# Patient Record
Sex: Male | Born: 1950 | Race: White | Hispanic: No | Marital: Married | Smoking: Former smoker
Health system: Southern US, Community
[De-identification: ages and names within clinical notes are randomized; demographics above are authoritative.]

## PROBLEM LIST (undated history)

## (undated) DIAGNOSIS — M869 Osteomyelitis, unspecified: Secondary | ICD-10-CM

## (undated) DIAGNOSIS — F419 Anxiety disorder, unspecified: Secondary | ICD-10-CM

## (undated) DIAGNOSIS — E119 Type 2 diabetes mellitus without complications: Secondary | ICD-10-CM

## (undated) DIAGNOSIS — I96 Gangrene, not elsewhere classified: Secondary | ICD-10-CM

## (undated) DIAGNOSIS — I10 Essential (primary) hypertension: Secondary | ICD-10-CM

## (undated) DIAGNOSIS — C801 Malignant (primary) neoplasm, unspecified: Secondary | ICD-10-CM

## (undated) DIAGNOSIS — H9190 Unspecified hearing loss, unspecified ear: Secondary | ICD-10-CM

## (undated) DIAGNOSIS — M1990 Unspecified osteoarthritis, unspecified site: Secondary | ICD-10-CM

## (undated) DIAGNOSIS — J302 Other seasonal allergic rhinitis: Secondary | ICD-10-CM

## (undated) HISTORY — DX: Essential (primary) hypertension: I10

## (undated) HISTORY — PX: FRACTURE SURGERY: SHX138

## (undated) HISTORY — DX: Gangrene, not elsewhere classified: I96

---
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
## 2009-01-04 HISTORY — PX: LEG AMPUTATION BELOW KNEE: SHX694

## 2009-01-12 ENCOUNTER — Inpatient Hospital Stay (HOSPITAL_COMMUNITY): Admission: EM | Admit: 2009-01-12 | Discharge: 2009-01-22 | Payer: Self-pay | Admitting: Emergency Medicine

## 2009-01-12 ENCOUNTER — Ambulatory Visit: Payer: Self-pay | Admitting: Internal Medicine

## 2009-01-14 ENCOUNTER — Encounter: Payer: Self-pay | Admitting: Internal Medicine

## 2009-01-18 ENCOUNTER — Encounter: Payer: Self-pay | Admitting: Vascular Surgery

## 2009-01-18 ENCOUNTER — Ambulatory Visit: Payer: Self-pay | Admitting: Vascular Surgery

## 2009-01-19 ENCOUNTER — Ambulatory Visit: Payer: Self-pay | Admitting: Physical Medicine & Rehabilitation

## 2009-01-26 ENCOUNTER — Ambulatory Visit: Payer: Self-pay | Admitting: Internal Medicine

## 2009-01-26 ENCOUNTER — Encounter: Payer: Self-pay | Admitting: Internal Medicine

## 2009-01-26 DIAGNOSIS — S88119A Complete traumatic amputation at level between knee and ankle, unspecified lower leg, initial encounter: Secondary | ICD-10-CM | POA: Insufficient documentation

## 2009-01-26 DIAGNOSIS — E1129 Type 2 diabetes mellitus with other diabetic kidney complication: Secondary | ICD-10-CM | POA: Insufficient documentation

## 2009-01-26 DIAGNOSIS — E119 Type 2 diabetes mellitus without complications: Secondary | ICD-10-CM

## 2009-01-26 DIAGNOSIS — I10 Essential (primary) hypertension: Secondary | ICD-10-CM | POA: Insufficient documentation

## 2009-01-26 LAB — CONVERTED CEMR LAB: Blood Glucose, Fingerstick: 177

## 2009-01-27 LAB — CONVERTED CEMR LAB
BUN: 21 mg/dL (ref 6–23)
CO2: 23 meq/L (ref 19–32)
Calcium: 8.8 mg/dL (ref 8.4–10.5)
Chloride: 96 meq/L (ref 96–112)
Creatinine, Ser: 1.03 mg/dL (ref 0.40–1.50)
Glucose, Bld: 153 mg/dL — ABNORMAL HIGH (ref 70–99)
Potassium: 5.1 meq/L (ref 3.5–5.3)
Sodium: 139 meq/L (ref 135–145)

## 2009-02-10 ENCOUNTER — Ambulatory Visit: Payer: Self-pay | Admitting: Internal Medicine

## 2009-02-16 ENCOUNTER — Encounter: Payer: Self-pay | Admitting: Internal Medicine

## 2009-02-17 ENCOUNTER — Ambulatory Visit: Payer: Self-pay | Admitting: Vascular Surgery

## 2009-02-19 ENCOUNTER — Encounter: Payer: Self-pay | Admitting: Internal Medicine

## 2009-02-23 ENCOUNTER — Ambulatory Visit: Payer: Self-pay | Admitting: Internal Medicine

## 2009-02-23 ENCOUNTER — Encounter: Payer: Self-pay | Admitting: Internal Medicine

## 2009-02-24 ENCOUNTER — Encounter: Payer: Self-pay | Admitting: Internal Medicine

## 2009-03-02 ENCOUNTER — Telehealth (INDEPENDENT_AMBULATORY_CARE_PROVIDER_SITE_OTHER): Payer: Self-pay | Admitting: *Deleted

## 2009-03-03 ENCOUNTER — Encounter: Payer: Self-pay | Admitting: Internal Medicine

## 2009-03-24 ENCOUNTER — Telehealth: Payer: Self-pay | Admitting: Internal Medicine

## 2009-03-31 ENCOUNTER — Encounter: Admission: RE | Admit: 2009-03-31 | Discharge: 2009-05-13 | Payer: Self-pay | Admitting: Vascular Surgery

## 2009-04-01 ENCOUNTER — Ambulatory Visit: Payer: Self-pay | Admitting: Internal Medicine

## 2009-04-01 DIAGNOSIS — N529 Male erectile dysfunction, unspecified: Secondary | ICD-10-CM | POA: Insufficient documentation

## 2009-04-01 LAB — CONVERTED CEMR LAB
Blood Glucose, Fingerstick: 104
Hgb A1c MFr Bld: 6.5 %

## 2009-04-02 ENCOUNTER — Telehealth: Payer: Self-pay | Admitting: Internal Medicine

## 2009-04-23 ENCOUNTER — Telehealth: Payer: Self-pay | Admitting: Internal Medicine

## 2009-05-27 ENCOUNTER — Encounter: Payer: Self-pay | Admitting: Internal Medicine

## 2009-05-28 ENCOUNTER — Ambulatory Visit: Payer: Self-pay | Admitting: Internal Medicine

## 2009-05-28 DIAGNOSIS — F17200 Nicotine dependence, unspecified, uncomplicated: Secondary | ICD-10-CM | POA: Insufficient documentation

## 2009-05-28 LAB — CONVERTED CEMR LAB
Blood Glucose, Fingerstick: 118
Hgb A1c MFr Bld: 6.5 %

## 2009-06-07 ENCOUNTER — Telehealth: Payer: Self-pay | Admitting: *Deleted

## 2009-09-03 ENCOUNTER — Encounter: Payer: Self-pay | Admitting: Internal Medicine

## 2009-09-03 ENCOUNTER — Telehealth: Payer: Self-pay | Admitting: Internal Medicine

## 2009-09-17 ENCOUNTER — Ambulatory Visit: Payer: Self-pay | Admitting: Internal Medicine

## 2009-09-17 LAB — CONVERTED CEMR LAB
Blood Glucose, Fingerstick: 120
Hgb A1c MFr Bld: 6.2 %

## 2009-10-19 ENCOUNTER — Telehealth: Payer: Self-pay | Admitting: Internal Medicine

## 2009-11-24 ENCOUNTER — Encounter: Payer: Self-pay | Admitting: Internal Medicine

## 2009-12-22 ENCOUNTER — Encounter: Payer: Self-pay | Admitting: Internal Medicine

## 2009-12-24 ENCOUNTER — Encounter: Payer: Self-pay | Admitting: Internal Medicine

## 2009-12-24 ENCOUNTER — Ambulatory Visit: Payer: Self-pay | Admitting: Internal Medicine

## 2009-12-24 LAB — CONVERTED CEMR LAB
ALT: 15 units/L (ref 0–53)
AST: 15 units/L (ref 0–37)
Albumin: 4.2 g/dL (ref 3.5–5.2)
Alkaline Phosphatase: 59 units/L (ref 39–117)
BUN: 16 mg/dL (ref 6–23)
Blood Glucose, Fingerstick: 142
CO2: 28 meq/L (ref 19–32)
Calcium: 9.5 mg/dL (ref 8.4–10.5)
Chloride: 103 meq/L (ref 96–112)
Cholesterol: 209 mg/dL — ABNORMAL HIGH (ref 0–200)
Creatinine, Ser: 0.79 mg/dL (ref 0.40–1.50)
Glucose, Bld: 132 mg/dL — ABNORMAL HIGH (ref 70–99)
HDL: 31 mg/dL — ABNORMAL LOW (ref >39)
Hgb A1c MFr Bld: 5.9 %
LDL Cholesterol: 127 mg/dL — ABNORMAL HIGH (ref 0–99)
Potassium: 4.4 meq/L (ref 3.5–5.3)
Sodium: 141 meq/L (ref 135–145)
Total Bilirubin: 0.4 mg/dL (ref 0.3–1.2)
Total CHOL/HDL Ratio: 6.7
Total Protein: 6.8 g/dL (ref 6.0–8.3)
Triglycerides: 254 mg/dL — ABNORMAL HIGH (ref <150)
VLDL: 51 mg/dL — ABNORMAL HIGH (ref 0–40)

## 2010-01-21 ENCOUNTER — Encounter: Payer: Self-pay | Admitting: Internal Medicine

## 2010-03-18 ENCOUNTER — Telehealth: Payer: Self-pay | Admitting: Internal Medicine

## 2010-03-24 ENCOUNTER — Encounter: Payer: Self-pay | Admitting: Internal Medicine

## 2010-04-05 NOTE — Letter (Signed)
Summary: HANGER-CMN  HANGER-CMN   Imported By: Shon Hough 02/01/2010 11:57:52  _____________________________________________________________________  External Attachment:    Type:   Image     Comment:   External Document

## 2010-04-05 NOTE — Letter (Signed)
Summary: Pharmacologist   Imported By: Florinda Marker 04/07/2009 14:09:22  _____________________________________________________________________  External Attachment:    Type:   Image     Comment:   External Document

## 2010-04-05 NOTE — Progress Notes (Signed)
Summary: refill/gg  Phone Note Refill Request  on September 03, 2009 12:19 PM  Refills Requested: Medication #1:  METFORMIN HCL 500 MG TABS Take 2 pills by mouth two times a day.  Method Requested: Electronic Initial call taken by: Merrie Roof RN,  September 03, 2009 12:19 PM  Follow-up for Phone Call        Refill approved-nurse to complete Follow-up by: Nelda Bucks DO,  September 03, 2009 12:39 PM

## 2010-04-05 NOTE — Progress Notes (Signed)
Summary: phone/gg      Phone Note Call from Patient   Caller: Spouse Summary of Call: Pt's wife called and states the LEVITRA 10 MG TABS cost $ 168 for 4 pills. Can you prescribe something less expensive but that will work with his health conditions. # [phone]  ex [address]  Eber Jones )  Initial call taken by: Merrie Roof RN,  April 02, 2009 9:52 AM  Follow-up for Phone Call        Pt may have Viagra  50mg  caps 1/2-1 tab by mouth 1 hr before activity #10 OR Cialis 10mg  tabs 1/2-1 tab by mouth 1 hr before activity #10.  Please complete the rx for whichever medication is cheaper.  Thank you! Follow-up by: Nelda Bucks DO,  April 02, 2009 2:33 PM  Additional Follow-up for Phone Call Additional follow up Details #1::        The Rx for viagra was called in.  I did not give any refills. Pt's wife informed. Additional Follow-up by: Merrie Roof RN,  April 02, 2009 3:07 PM    Additional Follow-up for Phone Call Additional follow up Details #2::    Thank you! Follow-up by: Nelda Bucks DO,  April 02, 2009 4:30 PM   Appended Document: phone/gg     Another call from pharmacy stating pt now wants Rx for levitra.  The viagra is MORE expensive that levitra.  I cancelled the Rx for viagra and okayed the orginal Rx for levitra that was sent in.

## 2010-04-05 NOTE — Letter (Signed)
Summary: ONE TOUCH  11-24-2009-12-2009  ONE TOUCH  11-24-2009-12-2009   Imported By: Margie Billet 12/28/2009 11:50:54  _____________________________________________________________________  External Attachment:    Type:   Image     Comment:   External Document

## 2010-04-05 NOTE — Progress Notes (Signed)
Summary: viagra/ hla  Phone Note Refill Request Message from:  Patient on October 19, 2009 1:51 PM  Refills Requested: Medication #1:  viagra 100mg  pt's spouse calls and ask for script of viagra 100mg , will cut in half, price is better this way  Initial call taken by: Marin Roberts RN,  October 19, 2009 1:52 PM  Follow-up for Phone Call        Rx electronically sent to pharmacy Follow-up by: Nelda Bucks DO,  October 19, 2009 2:07 PM    New/Updated Medications: VIAGRA 100 MG TABS (SILDENAFIL CITRATE) Take one tablet 30-58min before activity. Prescriptions: VIAGRA 100 MG TABS (SILDENAFIL CITRATE) Take one tablet 30-31min before activity.  #10 x 6   Entered and Authorized by:   Nelda Bucks DO   Signed by:   Nelda Bucks DO on 10/19/2009   Method used:   Electronically to        [facility] (retail)       [address], [address]       [place], [place]  [address]       Ph: [phone]       Fax: [phone]   RxID:   [id number]

## 2010-04-05 NOTE — Assessment & Plan Note (Signed)
Summary: Routine office visit   Vital Signs:  Patient profile:   60 year old male BP sitting:   121 / 80  (left arm)   Primary Care Provider:  Nelda Bucks DO   History of Present Illness: ** please refer to 2nd document for office visit 12/24/09 for vital sign data**  60 year old with history of DM, HTN, and  L BKA.  He presents today for routine follow up.  1. DM2: pt checking BG regularly.  Has monitor with him, CBGs ranging from 110-130s.  Pt taking all medication as directed without hypoglycemic or hyperglycemic episodes.  He is doing very well with metformin, now taking one tab every day.    2. HTN: stable.  Pt reports compliance with all medications. Denies HA, visual changes, dizziness, syncope, C/P, and SOB.  3. L BKA: Pt increase the amount of time his is up and ambulating with his prosthetic; now on his feet for approx 6hrs/day. He has recently changed to a new company re: his prosthetic.  His new prosthesis is much more comfortable and he has no problems with pain, abrasion, or bursitis.  4. Erectile dysfunction: Pt is doing well with Viarga.  No adverse effect.  5.  Tobacco use: pt is still smoking but is cutting back on the number of cigarettes he smokes daily.  Is interested in quitting but not quite ready to quit yet.  Pt has no other concerns or complaints today.    Diabetic Foot Exam Last Podiatry Exam Date: 12/24/2009  Foot Inspection Is there a history of a foot ulcer?              Yes Is there a foot ulcer now?              No Can the patient see the bottom of their feet?          Yes Are the shoes appropriate in style and fit?          Yes Is there swelling or an abnormal foot shape?          No Are the toenails long?                No Are the toenails thick?                No Are the toenails ingrown?              No Is there heavy callous build-up?              No Is there pain in the calf muscle (Intermittent claudication) when walking?    NoIs  there a claw toe deformity?              No Is there elevated skin temperature?            No Is there limited ankle dorsiflexion?            No Is there foot or ankle muscle weakness?            No  Diabetic Foot Care Education Patient educated on appropriate care of diabetic feet.  Pulse Check          Right Foot          Left Foot Posterior Tibial:        diminished            Dorsalis Pedis:        diminished  Comments: Pt is s/p L BKA. High Risk Feet? Yes Set Next Diabetic Foot Exam here: 12/30/2010   10-g (5.07) Semmes-Weinstein Monofilament Test Performed by: Chinita Pester RN          Right Foot          Left Foot Visual Inspection     normal          Test Control                  Site 1         normal          Site 2         normal          Site 3         normal          Site 4         normal          Site 5         normal          Site 6         normal          Site 7         normal          Site 8         normal          Site 9         normal           Impression      normal           Current Medications (verified): 1)  Lisinopril 20 Mg Tabs (Lisinopril) .... Take 1 Tablet By Mouth Daily. 2)  Flonase 50 Mcg/act Susp (Fluticasone Propionate) .... Once A Day. 3)  Novolog 100 Unit/ml Soln (Insulin Aspart) .... Ssi Used As Directed. 4)  Metformin Hcl 500 Mg Tabs (Metformin Hcl) .... Take 1 Tablet By Mouth Two Times A Day 5)  Viagra 100 Mg Tabs (Sildenafil Citrate) .... Take One Tablet 30-35min Before Activity.  Allergies (verified): 1)  ! Penicillin  Past History:  Past medical, surgical, family and social histories (including risk factors) reviewed, and no changes noted (except as noted below).  Past Medical History: Reviewed history from 01/26/2009 and no changes required. Diabetes mellitus, type II Hypertension Left fott gangrene S/P KBA  Family History: Reviewed history from 01/26/2009 and no changes required. Father: hip replacement, prostate  cancer 9 yo. Mother: Died 59.  Social History: Reviewed history from 01/26/2009 and no changes required. Married Current Smoker  Physical Exam  General:  vital signs reviewed and within normal limits.  pt alert, well-developed, well-nourished, and well-hydrated.   Head:  normocephalic and atraumatic.   Eyes:  vision grossly intact, pupils equal, pupils round, pupils reactive to light, pupils react to accomodation, and no injection.   Mouth:  pharynx pink and moist.   Neck:  supple and full ROM.   Lungs:  normal respiratory effort, normal breath sounds, no dullness, no crackles, and no wheezes.   Heart:  normal rate, regular rhythm, no murmur, no gallop, and no rub.   Abdomen:  soft, non-tender, normal bowel sounds, and no distention.   Msk:  normal ROM, no joint swelling, and no joint warmth. Prosthesis in place on LLE, pt able to stand and ambulate. A bunion has developed at the anterior medial aspect of pt BKA- this is without erythema, induration, or tenderness  to palpation Extremities:  Left BKA noted Neurologic:  alert & oriented X3, cranial nerves II-XII intact, and strength normal in all extremities.   Skin:  turgor normal, color normal, and no rashes.   Psych:  normally interactive, good eye contact, not anxious appearing, and not depressed appearing.    Diabetes Management Exam:    Foot Exam (with socks and/or shoes not present):       Sensory-Monofilament:          Right foot: normal   Impression & Recommendations:  Problem # 1:  DIABETES MELLITUS, TYPE II (ICD-250.00)  Pt continues to do very well managing his DM.  His updated medication list for this problem includes:    Lisinopril 20 Mg Tabs (Lisinopril) .Marland Kitchen... Take 1 tablet by mouth daily.    Novolog 100 Unit/ml Soln (Insulin aspart) ..... Ssi used as directed.    Metformin Hcl 500 Mg Tabs (Metformin hcl) .Marland Kitchen... Take 1 tablet by mouth two times a day  Labs Reviewed: Creat: 1.03 (01/26/2009)    Reviewed HgBA1c  results: 6.2 (09/17/2009)  6.5 (05/28/2009)  Future Orders: T-Lipid Profile ([id number]) ... 12/26/2009  Problem # 2:  HYPERTENSION (ICD-401.9) Initial BP elevated.  BP check repeated after visit with improvement to 121/80.  Will not make any changes to his medication regimen at this time.  His updated medication list for this problem includes:    Lisinopril 20 Mg Tabs (Lisinopril) .Marland Kitchen... Take 1 tablet by mouth daily.  Prior BP: 161/81 (09/17/2009)  Labs Reviewed: K+: 5.1 (01/26/2009) Creat: : 1.03 (01/26/2009)     Future Orders: T-CMP with Estimated GFR ([id number]) ... 12/26/2009 T-Lipid Profile [phone]) ... 12/26/2009  Problem # 3:  ERECTILE DYSFUNCTION, ORGANIC (ICD-607.84)  His updated medication list for this problem includes:    Viagra 100 Mg Tabs (Sildenafil citrate) .Marland Kitchen... Take one tablet 30-71min before activity.  Discussed proper use of medications, as well as side effects.   Problem # 4:  NICOTINE ADDICTION (ICD-305.1)  Encouraged smoking cessation and discussed different methods for smoking cessation.   Problem # 5:  Preventive Health Care (ICD-V70.0) Pt declines annual flu vax.  Complete Medication List: 1)  Lisinopril 20 Mg Tabs (Lisinopril) .... Take 1 tablet by mouth daily. 2)  Flonase 50 Mcg/act Susp (Fluticasone propionate) .... Once a day. 3)  Novolog 100 Unit/ml Soln (Insulin aspart) .... Ssi used as directed. 4)  Metformin Hcl 500 Mg Tabs (Metformin hcl) .... Take 1 tablet by mouth two times a day 5)  Viagra 100 Mg Tabs (Sildenafil citrate) .... Take one tablet 30-63min before activity.  Patient Instructions: 1)  Please schedule a follow-up appointment in 6 months with Dr. Arvilla Market. 2)  We will get blood work at your next appt. 3)  Bring the paperwork for handicap parking with you to your next appt. 4)  Keep up the great work! 5)  Happy Halloween!   Orders Added: 1)  Est. Patient Level III [65784] 2)  T-CMP with Estimated GFR  [[id number]] 3)  T-Lipid Profile [[id number]]    Prevention & Chronic Care Immunizations   Influenza vaccine: Not documented   Influenza vaccine deferral: Refused  (02/10/2009)    Tetanus booster: Not documented    Pneumococcal vaccine: Not documented  Colorectal Screening   Hemoccult: Not documented   Hemoccult action/deferral: Deferred  (12/24/2009)    Colonoscopy: Not documented  Other Screening   PSA: Not documented   PSA action/deferral: Discussion deferred  (12/24/2009)   Smoking status: current  (09/17/2009)  Smoking cessation counseling: no  (09/17/2009)  Diabetes Mellitus   HgbA1C: 6.2  (09/17/2009)   HgbA1C action/deferral: Ordered  (04/01/2009)   Hemoglobin A1C due: 04/02/2009    Eye exam: Not documented   Diabetic eye exam action/deferral: Deferred  (05/28/2009)   Eye exam due: 08/05/2009    Foot exam: yes  (12/24/2009)   Foot exam action/deferral: Do today   High risk foot: Yes  (12/24/2009)   Foot care education: Done  (12/24/2009)   Foot exam due: 12/30/2010    Urine microalbumin/creatinine ratio: Not documented    Diabetes flowsheet reviewed?: Yes   Progress toward A1C goal: At goal  Lipids   Total Cholesterol: Not documented   Lipid panel action/deferral: Deferred   LDL: Not documented   LDL Direct: Not documented   HDL: Not documented   Triglycerides: Not documented   Lipid panel due: 04/30/2009  Hypertension   Last Blood Pressure: 121 / 80  (12/24/2009)   Serum creatinine: 1.03  (01/26/2009)   Serum potassium 5.1  (01/26/2009)   Basic metabolic panel due: 08/06/2009  Self-Management Support :   Personal Goals (by the next clinic visit) :     Personal A1C goal: 7  (02/10/2009)     Personal blood pressure goal: 130/80  (02/10/2009)     Personal LDL goal: 100  (02/10/2009)    Diabetes self-management support: Copy of home glucose meter record, Education handout, Resources for patients handout, Written self-care plan   (09/17/2009)    Hypertension self-management support: Copy of home glucose meter record, Education handout, Resources for patients handout, Written self-care plan  (09/17/2009)   Nursing Instructions: Diabetic foot exam today  Process Orders Check Orders Results:     [facility]: ABN not required for this insurance Tests Sent for requisitioning (January 01, 2010 5:34 PM):     12/26/2009: [facility] -- T-CMP with Estimated GFR [[id number]] (signed)     12/26/2009: [facility] -- T-Lipid Profile [phone] (signed)

## 2010-04-05 NOTE — Assessment & Plan Note (Signed)
Summary: 4 week check up okay to book per dr [email or web address]   Vital Signs:  Patient profile:   60 year old male Height:      62 inches Weight:      209.0 pounds BMI:     38.36 Temp:     9.7 degrees F oral Pulse rate:   80 / minute BP sitting:   144 / 74  (right arm)  Vitals Entered By: Filomena Jungling NT II (April 01, 2009 1:37 PM) CC: hfu Is Patient Diabetic? Yes Did you bring your meter with you today? Yes Pain Assessment Patient in pain? no      Nutritional Status BMI of > 30 = obese CBG Result 104  Have you ever been in a relationship where you felt threatened, hurt or afraid?No   Does patient need assistance? Functional Status Self care Ambulation Wheelchair Comments has prosthesia   Primary Care Provider:  Nelda Bucks DO  CC:  hfu.  History of Present Illness: 60 year old with history of newly diagnosed DM, HTN, and recent R BKA.  He presents today for routine follow up.  1. DM2: pt checking BG regularly.  Has monitor with him, CBGs ranging from 110-130s.  Pt taking all medication as directed without hypoglycemic or hyperglycemic episodes.  He is doing very well with the transition from insulin to metformin; no side effects reported from metformin.  2. HTN: stable.  Pt reports compliance with all medications. Denies HA, visual changes, dizziness, syncope, C/P, and SOB.  3. L BKA: Pt now with prosthetic, able to stand in the office today!  Doing very well, working with PT/OT.  Is able to get around fairly well at home, slowly increasing the time he is using the prosthetic.    4. New complaint of erectile dysfunction.  Pt occasionally using Percocet at night for pain relief but states pain is well controlled with OTC NSAIDs and tylenol.  Preventive Screening-Counseling & Management  Alcohol-Tobacco     Smoking Status: current     Smoking Cessation Counseling: no     Packs/Day: 3/4 ppd  Current Medications (verified): 1)  Vicodin 5-500 Mg Tabs  (Hydrocodone-Acetaminophen) .... Take 1 Tablet Every 4 Hours For Pain As Needed. 2)  Lisinopril 20 Mg Tabs (Lisinopril) .... Take 1 Tablet By Mouth Daily. 3)  Flonase 50 Mcg/act Susp (Fluticasone Propionate) .... Once A Day. 4)  Novolog 100 Unit/ml Soln (Insulin Aspart) .... Ssi Used As Directed. 5)  Metformin Hcl 500 Mg Tabs (Metformin Hcl) .... Take 2 Pills By Mouth Two Times A Day. 6)  Levitra 10 Mg Tabs (Vardenafil Hcl) .... Take 1/2 Pill By Mouth 1hr Before Activity. May Increase To 1 Full Pill If Needed  Allergies (verified): 1)  ! Penicillin  Past History:  Past medical, surgical, family and social histories (including risk factors) reviewed, and no changes noted (except as noted below).  Past Medical History: Reviewed history from 01/26/2009 and no changes required. Diabetes mellitus, type II Hypertension Left fott gangrene S/P KBA  Family History: Reviewed history from 01/26/2009 and no changes required. Father: hip replacement, prostate cancer 83 yo. Mother: Died 70.  Social History: Reviewed history from 01/26/2009 and no changes required. Married Current Smoker  Physical Exam  General:  alert, well-developed, well-nourished, and well-hydrated.   Head:  normocephalic and atraumatic.   Eyes:  vision grossly intact, pupils equal, pupils round, pupils reactive to light, pupils react to accomodation, and no injection.   Mouth:  pharynx pink and moist.  Neck:  supple and full ROM.   Lungs:  normal respiratory effort, normal breath sounds, no dullness, no crackles, and no wheezes.   Heart:  normal rate, regular rhythm, no murmur, no gallop, and no rub.   Abdomen:  soft, non-tender, normal bowel sounds, and no distention.   Msk:  normal ROM, no joint swelling, and no joint warmth. Prosthesis in place, pt able to stand comfortably.     Neurologic:  alert & oriented X3, cranial nerves II-XII intact, and strength normal in all extremities.   Psych:  normally interactive,  good eye contact, not anxious appearing, and not depressed appearing.     Impression & Recommendations:  Problem # 1:  DIABETES MELLITUS, TYPE II (ICD-250.00) Pt doing very well with his transition to Metformin.  Advised pt to use SSI only if needed for meal time coverage.  He has not needed to use it often over the past few weeks.  I anticipate he will be well controlled with metformin alone.  WIll repeat HcA1c today and f/u in 69month. His updated medication list for this problem includes:    Lisinopril 20 Mg Tabs (Lisinopril) .Marland Kitchen... Take 1 tablet by mouth daily.    Novolog 100 Unit/ml Soln (Insulin aspart) ..... Ssi used as directed.    Metformin Hcl 500 Mg Tabs (Metformin hcl) .Marland Kitchen... Take 2 pills by mouth two times a day.  Orders: T-Hgb A1C (in-house) ([id number])  Problem # 2:  HYPERTENSION (ICD-401.9) BP stable.  Will continue with current dose of lisinopril for now, if SBP remains >140 may need to increase dose.  Pt is doing very well with diet and lifestyle changes and may not need increase of lisinopril.  His updated medication list for this problem includes:    Lisinopril 20 Mg Tabs (Lisinopril) .Marland Kitchen... Take 1 tablet by mouth daily.  Problem # 3:  BKA, LEFT LEG (ICD-V49.75) Pt doind incredibly well.  Now with prosthetic leg, working with PT.  Follows with Dr. Darrick Penna.  Reports some pain with increased length of prosthetic use.  Pain fairly well controlled with OTC meds.  Advised pt it is ok to use percocet if needed.  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  Levitra appears to be the least expensive option.  WIll change to Cialis or Viagra if needed 2/2 cost.  Reviewed methods of use and  possible side effects.  Will start with 1/2 pill and increase if needed.  His updated medication list for this problem includes:    Levitra 10 Mg Tabs (Vardenafil hcl) .Marland Kitchen... Take 1/2 pill by mouth 1hr before activity. may increase to 1 full pill if needed  Problem # 5:   HEMATURIA UNSPECIFIED (ICD-599.70) Pt wo follow up with urology.  WIll repeat UA and next visit and ensure pt made f/u appt.  Complete Medication List: 1)  Vicodin 5-500 Mg Tabs (Hydrocodone-acetaminophen) .... Take 1 tablet every 4 hours for pain as needed. 2)  Lisinopril 20 Mg Tabs (Lisinopril) .... Take 1 tablet by mouth daily. 3)  Flonase 50 Mcg/act Susp (Fluticasone propionate) .... Once a day. 4)  Novolog 100 Unit/ml Soln (Insulin aspart) .... Ssi used as directed. 5)  Metformin Hcl 500 Mg Tabs (Metformin hcl) .... Take 2 pills by mouth two times a day. 6)  Levitra 10 Mg Tabs (Vardenafil hcl) .... Take 1/2 pill by mouth 1hr before activity. may increase to 1 full pill if needed  Patient Instructions: 1)  Please schedule a follow-up appointment in  2 months. 2)  Regarding the Levitra: The starting dose is 5mg .  I have written for 10mg  tablets.  Get a pill cutter and cut the 10mg  pill in half.  Take half of a tablet 1hr before activity.  Try this a few times, if it does not work, increase to 1 full tablet. 3)  Keep up the great work!!! Prescriptions: LEVITRA 10 MG TABS (VARDENAFIL HCL) Take 1/2 pill by mouth 1hr before activity. May increase to 1 full pill if needed  #10 x 1   Entered and Authorized by:   Nelda Bucks DO   Signed by:   Nelda Bucks DO on 04/01/2009   Method used:   Print then Give to Patient   RxID:   [id number] FLONASE 50 MCG/ACT SUSP (FLUTICASONE PROPIONATE) once a day.  #1 x 11   Entered and Authorized by:   Nelda Bucks DO   Signed by:   Nelda Bucks DO on 04/01/2009   Method used:   Print then Give to Patient   RxID:   [id number]   Prevention & Chronic Care Immunizations   Influenza vaccine: Not documented   Influenza vaccine deferral: Refused  (02/10/2009)    Tetanus booster: Not documented    Pneumococcal vaccine: Not documented  Colorectal Screening   Hemoccult: Not documented    Colonoscopy: Not documented  Other Screening    PSA: Not documented   Smoking status: current  (04/01/2009)   Smoking cessation counseling: no  (04/01/2009)  Diabetes Mellitus   HgbA1C: 6.5  (04/01/2009)   HgbA1C action/deferral: Ordered  (04/01/2009)   Hemoglobin A1C due: 04/02/2009    Eye exam: Not documented    Foot exam: Not documented   High risk foot: Not documented   Foot care education: Not documented    Urine microalbumin/creatinine ratio: Not documented    Diabetes flowsheet reviewed?: Yes   Progress toward A1C goal: Unchanged  Lipids   Total Cholesterol: Not documented   LDL: Not documented   LDL Direct: Not documented   HDL: Not documented   Triglycerides: Not documented   Lipid panel due: 04/30/2009  Hypertension   Last Blood Pressure: 144 / 74  (04/01/2009)   Serum creatinine: 1.03  (01/26/2009)   Serum potassium 5.1  (01/26/2009)   Basic metabolic panel due: 04/30/2009  Self-Management Support :   Personal Goals (by the next clinic visit) :     Personal A1C goal: 7  (02/10/2009)     Personal blood pressure goal: 130/80  (02/10/2009)     Personal LDL goal: 100  (02/10/2009)    Diabetes self-management support: Copy of home glucose meter record  (02/23/2009)    Hypertension self-management support: Not documented   Nursing Instructions: HgbA1C today (see order)    Laboratory Results   Blood Tests   Date/Time Received: April 01, 2009 3:09 PM  Date/Time Reported: Blenda Mounts  April 01, 2009 3:09 PM   HGBA1C: 6.5%   (Normal Range: Non-Diabetic - 3-6%   Control Diabetic - 6-8%) CBG Random:: 104mg /dL

## 2010-04-05 NOTE — Assessment & Plan Note (Signed)
Summary: est-ck/fu/meds/cfb   Vital Signs:  Patient profile:   60 year old male Height:      62 inches Weight:      208.6 pounds Temp:     97.8 degrees F oral  Vitals Entered By: Filomena Jungling NT II (May 28, 2009 1:47 PM)  Primary Care Provider:  Nelda Bucks DO   History of Present Illness: 60 year old with history of newly diagnosed DM, HTN, and recent R BKA.  He presents today for routine follow up.  1. DM2: pt checking BG regularly.  Has monitor with him, CBGs ranging from 110-130s.  Pt taking all medication as directed without hypoglycemic or hyperglycemic episodes.  He is doing very well with metformin, now taking one tab qam and 2 qpm about every other day; no side effects reported from metformin.  2. HTN: stable.  Pt reports compliance with all medications. Denies HA, visual changes, dizziness, syncope, C/P, and SOB.  3. 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  Doing very well, graduated PT/OT.  Is able to get around well at home, slowly increasing the time he is using the prosthetic.    4. Erectile dysfunction: Levitra is working well.  Denies any side effects from medication.  Wants to try Viagra to see if it is less expensive.  5.  Tobacco use: pt is still smoking but is cutting back on the number of cigarettes he smokes daily.  Is interested in quitting but not quite ready to quit yet.    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 Take 1 Tablet Every 4 Hours For Pain As Needed. 2)  Lisinopril 20 Mg Tabs (Lisinopril) .... Take 1 Tablet By Mouth Daily. 3)  Flonase 50 Mcg/act Susp (Fluticasone Propionate) .... Once A Day. 4)  Novolog 100 Unit/ml Soln (Insulin Aspart) .... Ssi Used As  Directed. 5)  Metformin Hcl 500 Mg Tabs (Metformin Hcl) .... Take 2 Pills By Mouth Two Times A Day. 6)  Levitra 20 Mg Tabs (Vardenafil Hcl) .... Take 1/2 Tablet By Mouth 1 Hour Before Activity Once A Day As Needed  Allergies (verified): 1)  ! Penicillin  Past History:  Past medical, surgical, family and social histories (including risk factors) reviewed, and no changes noted (except as noted below).  Past Medical History: Reviewed history from 01/26/2009 and no changes required. Diabetes mellitus, type II Hypertension Left fott gangrene S/P KBA  Family History: Reviewed history from 01/26/2009 and no changes required. Father: hip replacement, prostate cancer 52 yo. Mother: Died 52.  Social History: Reviewed history from 01/26/2009 and no changes required. Married Current Smoker  Physical Exam  General:  alert, well-developed, well-nourished, and well-hydrated.   Head:  normocephalic and atraumatic.   Eyes:  vision grossly intact, pupils equal, pupils round, pupils reactive to light, pupils react to accomodation, and no injection.   Mouth:  pharynx pink and moist.   Lungs:  normal respiratory effort, normal breath sounds, no dullness, no crackles, and no wheezes.   Heart:  normal rate, regular rhythm, no murmur, no gallop, and no rub.   Abdomen:  soft, non-tender, normal bowel sounds, and no distention.   Msk:  normal ROM, no joint swelling, and no joint warmth. Prosthesis in place,  pt able to stand and ambulate comfortably.     Neurologic:  alert & oriented X3, cranial nerves II-XII intact, and strength normal in all extremities.   Skin:  turgor normal, color normal, and no rashes.   Psych:  normally interactive, good eye contact, not anxious appearing, and not depressed appearing.     Impression & Recommendations:  Problem # 1:  DIABETES MELLITUS, TYPE II (ICD-250.00) Pt is doing very well managing his diabetes.  Will change his metformin regimen to 1 tab qam and 2 qpm;  repeat A1c in 34mo and remove insulin from his medicationl list.  Will refer for eye exam at next appt.  His updated medication list for this problem includes:    Lisinopril 20 Mg Tabs (Lisinopril) .Marland Kitchen... Take 1 tablet by mouth daily.    Novolog 100 Unit/ml Soln (Insulin aspart) ..... Ssi used as directed.    Metformin Hcl 500 Mg Tabs (Metformin hcl) .Marland Kitchen... Take 2 pills by mouth two times a day.  Orders: T- Capillary Blood Glucose (04540) T-Hgb A1C (in-house) ([id number])  Labs Reviewed: Creat: 1.03 (01/26/2009)    Reviewed HgBA1c results: 6.5 (05/28/2009)  6.5 (04/01/2009)  Problem # 2:  HYPERTENSION (ICD-401.9)  Stable.  Will continue to monitor and check BMET in 34mo.  His updated medication list for this problem includes:    Lisinopril 20 Mg Tabs (Lisinopril) .Marland Kitchen... Take 1 tablet by mouth daily.  Prior BP: 144/74 (04/01/2009)  Labs Reviewed: K+: 5.1 (01/26/2009) Creat: : 1.03 (01/26/2009)     Problem # 3:  ERECTILE DYSFUNCTION, ORGANIC (ICD-607.84)  Pt is doing well with Levitra.  Will write script for Viagra and make change to EMR if pt desires to continue with Viagra 2/2 decreased cost.  His updated medication list for this problem includes:    Levitra 20 Mg Tabs (Vardenafil hcl) .Marland Kitchen... Take 1/2 tablet by mouth 1 hour before activity once a day as needed  Discussed proper use of medications, as well as side effects.   Problem # 4:  HEMATURIA UNSPECIFIED (ICD-599.70) Pt was seen by Dr. Brunilda Payor; reports no abnormalities on exam with resolution of hematuria.  Will request records.  Pt has f/u with Dr. Brunilda Payor in June 2011.  Problem # 5:  NICOTINE ADDICTION (ICD-305.1)  Encouraged smoking cessation and discussed different methods for smoking cessation.   Pt is currently working on decrease the number of cigarettes he smokes every day.  WIll f/u at next visit.  Complete Medication List: 1)  Vicodin 5-500 Mg Tabs (Hydrocodone-acetaminophen) .... Take 1 tablet every 4 hours for pain as  needed. 2)  Lisinopril 20 Mg Tabs (Lisinopril) .... Take 1 tablet by mouth daily. 3)  Flonase 50 Mcg/act Susp (Fluticasone propionate) .... Once a day. 4)  Novolog 100 Unit/ml Soln (Insulin aspart) .... Ssi used as directed. 5)  Metformin Hcl 500 Mg Tabs (Metformin hcl) .... Take 2 pills by mouth two times a day. 6)  Levitra 20 Mg Tabs (Vardenafil hcl) .... Take 1/2 tablet by mouth 1 hour before activity once a day as needed  Patient Instructions: 1)  Please schedule a follow-up appointment in 3 months with Dr. Arvilla Market. 2)  Take one Metformin pill in the morning and 2 at night.   3)  Keep monitoring your blood sugar closely at first, but then decrease your sugar checks to 2-3 times/week. 4)  With the viagra, it is ok to cut the pills in half and try half of a pill the first time you use it.  5)  Keep up the good work!    Laboratory Results   Blood Tests   Date/Time Received: May 28, 2009 2:19 PM  Date/Time Reported: Burke Keels  May 28, 2009 2:19 PM   HGBA1C: 6.5%   (Normal Range: Non-Diabetic - 3-6%   Control Diabetic - 6-8%) CBG Random:: 118mg /dL     Prevention & Chronic Care Immunizations   Influenza vaccine: Not documented   Influenza vaccine deferral: Refused  (02/10/2009)    Tetanus booster: Not documented    Pneumococcal vaccine: Not documented  Colorectal Screening   Hemoccult: Not documented    Colonoscopy: Not documented  Other Screening   PSA: Not documented  Reports requested:  Smoking status: current  (04/01/2009)   Smoking cessation counseling: no  (04/01/2009)  Diabetes Mellitus   HgbA1C: 6.5  (05/28/2009)   HgbA1C action/deferral: Ordered  (04/01/2009)   Hemoglobin A1C due: 04/02/2009    Eye exam: Not documented   Diabetic eye exam action/deferral: Deferred  (05/28/2009)   Eye exam due: 08/05/2009    Foot exam: Not documented   High risk foot: Not documented   Foot care education: Not documented    Urine microalbumin/creatinine  ratio: Not documented    Diabetes flowsheet reviewed?: Yes   Progress toward A1C goal: At goal  Lipids   Total Cholesterol: Not documented   Lipid panel action/deferral: Deferred   LDL: Not documented   LDL Direct: Not documented   HDL: Not documented   Triglycerides: Not documented   Lipid panel due: 04/30/2009  Hypertension   Last Blood Pressure: 144 / 74  (04/01/2009)   Serum creatinine: 1.03  (01/26/2009)   Serum potassium 5.1  (01/26/2009)   Basic metabolic panel due: 08/06/2009    Hypertension flowsheet reviewed?: Yes   Progress toward BP goal: Unchanged  Self-Management Support :   Personal Goals (by the next clinic visit) :     Personal A1C goal: 7  (02/10/2009)     Personal blood pressure goal: 130/80  (02/10/2009)     Personal LDL goal: 100  (02/10/2009)    Diabetes self-management support: Copy of home glucose meter record  (02/23/2009)    Hypertension self-management support: Not documented   Nursing Instructions: Request records from Dr. Brunilda Payor, urology.

## 2010-04-05 NOTE — Progress Notes (Signed)
Summary: refill/gg  Phone Note Refill Request  on September 03, 2009 1:38 PM  Refills Requested: Medication #1:  METFORMIN HCL 500 MG TABS Take 2 pills by mouth two times a day.  Method Requested: Electronic Initial call taken by: Merrie Roof RN,  September 03, 2009 1:39 PM  Follow-up for Phone Call        Rx submited to pharmacy Follow-up by: Nelda Bucks DO,  September 03, 2009 1:45 PM    Prescriptions: METFORMIN HCL 500 MG TABS (METFORMIN HCL) Take 2 pills by mouth two times a day.  #120 x 3   Entered and Authorized by:   Nelda Bucks DO   Signed by:   Nelda Bucks DO on 09/03/2009   Method used:   Electronically to        [facility] [address] (retail)       [address], [address]       [place], [place]  [address]       Ph: [phone]       Fax: [phone]   RxID:   [id number]

## 2010-04-05 NOTE — Progress Notes (Signed)
Summary: phone/gg  Phone Note Call from Patient   Caller: Patient Summary of Call: Pt called for results of A1C last labs given. Initial call taken by: Merrie Roof RN,  June 07, 2009 5:08 PM

## 2010-04-05 NOTE — Progress Notes (Signed)
Summary: Metformin/dosing  Phone Note Call from Patient   Caller: Patient Call For: Lonnie Bucks DO Summary of Call: Call from pt and his wife need refill on pt's Metformin.  Pt is taking 2 tablets of the 500 mg in the am and pm.  Pt has not done the change that was ordered 03/02/2009. Says that they were unaware of the change in the dosing.  Sugars have been the lowest at 90 and as high as 156 a couple of days ago after eating sugar free cookies.  Pt says that he feel fine has no diarrhea only constipation due to his pain med.  Question should pt continue or go to the planned dosing.  Pt has an appointment scheduled for 04/01/2009. Angelina Ok RN  March 24, 2009 9:41 AM  Initial call taken by: Angelina Ok RN,  March 24, 2009 9:41 AM  Follow-up for Phone Call        I read the note you mentioned and I think there was a typo.  I think the pt is taking the medicine as it was intended (2 pills two times a day).  The pt was being transitioned from insulin to all oral meds and was on a schedule to titrate UP metformin.  Leave Metformin 2 pills two times a day. Called pt to verify dosing.  Will refill.    New/Updated Medications: METFORMIN HCL 500 MG TABS (METFORMIN HCL) Take 2 pills by mouth two times a day. Prescriptions: METFORMIN HCL 500 MG TABS (METFORMIN HCL) Take 2 pills by mouth two times a day.  #120 x 3   Entered and Authorized by:   Blanch Media MD   Signed by:   Blanch Media MD on 03/24/2009   Method used:   Electronically to        [facility] [address] (retail)       [address]       [place], [place]  [address]       Ph: [phone]       Fax: [phone]   RxID:   [id number]

## 2010-04-05 NOTE — Assessment & Plan Note (Signed)
Summary: CHECKUP/SB.   Vital Signs:  Patient profile:   60 year old male Height:      62 inches (157.48 cm) Weight:      206.2 pounds (93.73 kg) BMI:     37.85 Temp:     97.4 degrees F oral Pulse rate:   88 / minute BP sitting:   122 / 71  (right arm)  Vitals Entered By: Chinita Pester RN (December 24, 2009 2:07 PM) CC: Check-up. Is Patient Diabetic? Yes Did you bring your meter with you today? Yes Pain Assessment Patient in pain? no      Nutritional Status BMI of > 30 = obese CBG Result 142  Have you ever been in a relationship where you felt threatened, hurt or afraid?Unable to ask; WIFE W/PT.   Does patient need assistance? Functional Status Self care Ambulation Normal   Primary Care Girlie Veltri:  Nelda Bucks DO  CC:  Check-up.Marland Kitchen  History of Present Illness: Please refer to 2nd note dated 10/21 for full visit info  Depression History:      The patient denies a depressed mood most of the day and a diminished interest in his usual daily activities.         Preventive Screening-Counseling & Management  Alcohol-Tobacco     Alcohol drinks/day: 0     Smoking Status: current     Smoking Cessation Counseling: no     Smoke Cessation Stage: contemplative     Packs/Day: 3/4 -1 ppd  Caffeine-Diet-Exercise     Does Patient Exercise: no  Allergies: 1)  ! Penicillin  Social History: Packs/Day:  3/4 -1 ppd   Complete Medication List: 1)  Vicodin 5-500 Mg Tabs (Hydrocodone-acetaminophen) .... Take 1 tablet every 4 hours for pain as needed. 2)  Lisinopril 20 Mg Tabs (Lisinopril) .... Take 1 tablet by mouth daily. 3)  Flonase 50 Mcg/act Susp (Fluticasone propionate) .... Once a day. 4)  Novolog 100 Unit/ml Soln (Insulin aspart) .... Ssi used as directed. 5)  Metformin Hcl 500 Mg Tabs (Metformin hcl) .... Take 1 tablet by mouth two times a day 6)  Viagra 100 Mg Tabs (Sildenafil citrate) .... Take one tablet 30-5min before activity.  Other Orders: T- Capillary Blood  Glucose (16109) T-Hgb A1C (in-house) ([id number])   Orders Added: 1)  T- Capillary Blood Glucose [82948] 2)  T-Hgb A1C (in-house) [[id number]]    Prevention & Chronic Care Immunizations   Influenza vaccine: Not documented   Influenza vaccine deferral: Refused  (02/10/2009)    Tetanus booster: Not documented    Pneumococcal vaccine: Not documented  Colorectal Screening   Hemoccult: Not documented    Colonoscopy: Not documented  Other Screening   PSA: Not documented   Smoking status: current  (12/24/2009)   Smoking cessation counseling: no  (12/24/2009)  Diabetes Mellitus   HgbA1C: 5.9  (12/24/2009)   HgbA1C action/deferral: Ordered  (04/01/2009)   Hemoglobin A1C due: 04/02/2009    Eye exam: Not documented   Diabetic eye exam action/deferral: Deferred  (05/28/2009)   Eye exam due: 08/05/2009    Foot exam: Not documented   Foot exam action/deferral: Do today   High risk foot: Yes  (09/17/2009)   Foot care education: Done  (09/17/2009)    Urine microalbumin/creatinine ratio: Not documented  Lipids   Total Cholesterol: Not documented   Lipid panel action/deferral: Deferred   LDL: Not documented   LDL Direct: Not documented   HDL: Not documented   Triglycerides: Not documented   Lipid panel due:  04/30/2009  Hypertension   Last Blood Pressure: 122 / 71  (12/24/2009)   Serum creatinine: 1.03  (01/26/2009)   Serum potassium 5.1  (01/26/2009)   Basic metabolic panel due: 08/06/2009  Self-Management Support :   Personal Goals (by the next clinic visit) :     Personal A1C goal: 7  (02/10/2009)     Personal blood pressure goal: 130/80  (02/10/2009)     Personal LDL goal: 100  (02/10/2009)    Patient will work on the following items until the next clinic visit to reach self-care goals:     Medications and monitoring: check my blood sugar, bring all of my medications to every visit, examine my feet every day  (12/24/2009)     Eating: eat more vegetables, use fresh or  frozen vegetables, eat foods that are low in salt, eat baked foods instead of fried foods  (12/24/2009)    Diabetes self-management support: Copy of home glucose meter record, Written self-care plan  (12/24/2009)   Diabetes care plan printed    Hypertension self-management support: Written self-care plan  (12/24/2009)   Hypertension self-care plan printed.  Vicodin 5/500mg  - disposed of 219 tablets; witnessed by Dr. Coralee Pesa.   Chinita Pester RN  December 24, 2009 2:30 PM   Laboratory Results   Blood Tests   Date/Time Received: December 24, 2009 2:30 PM Date/Time Reported: .sign   HGBA1C: 5.9%   (Normal Range: Non-Diabetic - 3-6%   Control Diabetic - 6-8%) CBG Random:: 142mg /dL

## 2010-04-05 NOTE — Progress Notes (Signed)
Summary: change dosage/ hla  Phone Note Call from Patient   Caller: Spouse Summary of Call: pt's spouse calls w/ pt in background, stating 20mg  leVitra is $9.00 ea at [facility], they would like to get 10 20mg  tabs and cut them in half, can you send a new script? Initial call taken by: Marin Roberts RN,  April 23, 2009 10:27 AM  Follow-up for Phone Call        Please call in a script for Levitra 20mg  tabs. Sig: 1/2 to 1 tab by mouth 1hr before activity.  Disp #10.  Thank you!  I am at home sick today and will try to check my desktop regulary.  If you need something urgently, please ask the clinic attending.  Thanks! Follow-up by: Nelda Bucks DO,  April 23, 2009 10:34 AM    New/Updated Medications: LEVITRA 20 MG TABS (VARDENAFIL HCL) Take 1/2 tablet by mouth 1 hour before activity once a day as needed Prescriptions: LEVITRA 20 MG TABS (VARDENAFIL HCL) Take 1/2 tablet by mouth 1 hour before activity once a day as needed  #10 x 5   Entered and Authorized by:   Doneen Poisson MD   Signed by:   Doneen Poisson MD on 04/26/2009   Method used:   Electronically to        [facility] [address] (retail)       [address], [address]       [place], [place]  [address]       Ph: [phone]       Fax: [phone]   RxID:   [id number]

## 2010-04-05 NOTE — Consult Note (Signed)
[facility]   Imported By: Margie Billet 09/13/2009 16:06:20  _____________________________________________________________________  External Attachment:    Type:   Image     Comment:   External Document

## 2010-04-05 NOTE — Medication Information (Signed)
Summary: METFORMIN  METFORMIN   Imported By: Margie Billet 12/23/2009 12:28:28  _____________________________________________________________________  External Attachment:    Type:   Image     Comment:   External Document

## 2010-04-05 NOTE — Assessment & Plan Note (Signed)
Summary: est-ck/fu/meds/cfb   Vital Signs:  Patient profile:   60 year old male Height:      62 inches (157.48 cm) Weight:      206.1 pounds (93.68 kg) BMI:     37.83 Temp:     97.9 degrees F oral Pulse rate:   80 / minute BP sitting:   161 / 81  (right arm)  Vitals Entered By: Chinita Pester RN (September 17, 2009 1:42 PM) CC: F/U visit. Is Patient Diabetic? Yes Did you bring your meter with you today? Yes Pain Assessment Patient in pain? no      Nutritional Status BMI of > 30 = obese CBG Result 120  Have you ever been in a relationship where you felt threatened, hurt or afraid?Unable to ask; someone w/pt.   Does patient need assistance? Functional Status Self care Ambulation Impaired:Risk for fall Comments BKA   Diabetic Foot Exam Last Podiatry Exam Date: 09/17/2009  Foot Inspection Is there a history of a foot ulcer?              No Is there a foot ulcer now?              No Can the patient see the bottom of their feet?          Yes Are the shoes appropriate in style and fit?          Yes Is there swelling or an abnormal foot shape?          No Are the toenails long?                No Are the toenails thick?                No Are the toenails ingrown?              No Is there heavy callous build-up?              No  Diabetic Foot Care Education Patient educated on appropriate care of diabetic feet.  Pulse Check          Right Foot          Left Foot Dorsalis Pedis:        diminished            Comments: Left BKA Healing sore on ankle. High Risk Feet? Yes   10-g (5.07) Semmes-Weinstein Monofilament Test Performed by: Chinita Pester RN          Right Foot          Left Foot Visual Inspection               Test Control                  Site 1         normal          Site 2         normal          Site 3         normal          Site 4         normal          Site 5         normal          Site 6         normal          Site 7  normal          Site 8          normal          Site 9         abnormal            Primary Care Provider:  Nelda Bucks DO  CC:  F/U visit.Marland Kitchen  History of Present Illness: 60 year old with history of newly diagnosed DM, HTN, and recent R BKA.  He presents today for routine follow up.  1. DM2: pt checking BG regularly.  Has monitor with him, CBGs ranging from 110-130s.  Pt taking all medication as directed without hypoglycemic or hyperglycemic episodes.  He is doing very well with metformin, now taking one tab qam and 2 qpm about every other day.  Would like to try further decrease in metformin  2. HTN: stable.  Pt reports compliance with all medications. Denies HA, visual changes, dizziness, syncope, C/P, and SOB.  3. L BKA: Pt increase the amount of time his is up and ambulating with his prosthetic; now on his feet for approx 6hrs/day.  He is experiencing some patellar pain from his prosthetic; this has become more pronounced with time as his leg continues to change.  He has a f/u appt with the prosthetic ppl on 09/24/09 regarding this issue.  4. Erectile dysfunction: Levitra is working well however he experiences occasional dizziness with it.  He has tried the Viagra at the full 50mg  dose without any adverse effect and would prefer to continue with this, however it is more expensive.  5.  Tobacco use: pt is still smoking but is cutting back on the number of cigarettes he smokes daily.  Is interested in quitting but not quite ready to quit yet.    Depression History:      The patient denies a depressed mood most of the day and a diminished interest in his usual daily activities.  The patient denies significant weight loss, significant weight gain, insomnia, fatigue (loss of energy), and feelings of worthlessness (guilt).  The patient denies symptoms of a manic disorder including less need for sleep and talkative or feels need to keep talking.         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 Take 1 Tablet Every 4 Hours For Pain As Needed. 2)  Lisinopril 20 Mg Tabs (Lisinopril) .... Take 1 Tablet By Mouth Daily. 3)  Flonase 50 Mcg/act Susp (Fluticasone Propionate) .... Once A Day. 4)  Novolog 100 Unit/ml Soln (Insulin Aspart) .... Ssi Used As Directed. 5)  Metformin Hcl 500 Mg Tabs (Metformin Hcl) .... Take 1 Tablet By Mouth Two Times A Day 6)  Levitra 20 Mg Tabs (Vardenafil Hcl) .... Take 1/2 Tablet By Mouth 1 Hour Before Activity Once A Day As Needed  Allergies (verified): 1)  ! Penicillin  Past History:  Past medical, surgical,  family and social histories (including risk factors) reviewed for relevance to current acute and chronic problems.  Past Medical History: Reviewed history from 01/26/2009 and no changes required. Diabetes mellitus, type II Hypertension Left fott gangrene S/P KBA  Family History: Reviewed history from 01/26/2009 and no changes required. Father: hip replacement, prostate cancer 56 yo. Mother: Died 47.  Social History: Reviewed history from 01/26/2009 and no changes required. Married Current Smoker Does Patient Exercise:  no  Physical Exam  General:  alert, well-developed, well-nourished, and well-hydrated.   Head:  normocephalic and atraumatic.   Eyes:  vision grossly intact, pupils equal, pupils round, pupils reactive to light, pupils react to accomodation, and no injection.     Mouth:  pharynx pink and moist.   Neck:  supple and full ROM.   Lungs:  normal respiratory effort, normal breath sounds, no dullness, no crackles, and no wheezes.   Heart:  normal rate, regular rhythm, no murmur, no gallop, and no rub.   Abdomen:  soft, non-tender, normal bowel sounds, and no distention.   Msk:  normal ROM, no joint swelling, and no joint warmth. Prosthesis in place on LLE, pt able to stand and ambulate. A bunion has developed at the anterior medial aspect of pt BKA- this is without erythema, induration, or tenderness to palpation Extremities:  Left BKA Neurologic:  alert & oriented X3, cranial nerves II-XII intact, and strength normal in all extremities.   Skin:  turgor normal, color normal, and no rashes.   Psych:  normally interactive, good eye contact, not anxious appearing, and not depressed appearing.     Impression & Recommendations:  Problem # 1:  DIABETES MELLITUS, TYPE II (ICD-250.00) Pt continues to do remarkably well managing his DM2.  Will decrease his metformin to 500mg  two times a day and check A1c in 56mo.    His updated medication list for this problem includes:    Lisinopril 20 Mg Tabs (Lisinopril) .Marland Kitchen... Take 1 tablet by mouth daily.    Novolog 100 Unit/ml Soln (Insulin aspart) ..... Ssi used as directed.    Metformin Hcl 500 Mg Tabs (Metformin hcl) .Marland Kitchen... Take 1 tablet by mouth two times a day  Orders: T- Capillary Blood Glucose (78295) T-Hgb A1C (in-house) (62130QM)  Labs Reviewed: Creat: 1.03 (01/26/2009)    Reviewed HgBA1c results: 6.2 (09/17/2009)  6.5 (05/28/2009)  Problem # 2:  HYPERTENSION (ICD-401.9) BP elevated today.  Will continue to monitor; if it remains elevated will consider addition of HCTZ to his anti-HTN regimen. His updated medication list for this problem includes:    Lisinopril 20 Mg Tabs (Lisinopril) .Marland Kitchen... Take 1 tablet by mouth daily.  BP today: 161/81 Prior BP: 144/74 (04/01/2009)  Labs Reviewed: K+: 5.1  (01/26/2009) Creat: : 1.03 (01/26/2009)     Problem # 3:  ERECTILE DYSFUNCTION, ORGANIC (ICD-607.84) Pt prefers Viagra, however it is more expensive.  He does well with the 50mg  tabs; will determine if the 100mg  form of Viagra is the same cost as the 50mg  tabs.  If this is so, will write for 100mg  tabs and advise pt to cut pills in half.  If the 100mg  tabs are more expensive, will discuss plan to continue with Viagra vs Levitra with pt.  His updated medication list for this problem includes:    Levitra 20 Mg Tabs (Vardenafil hcl) .Marland Kitchen... Take 1/2 tablet by mouth 1 hour before activity once a day as needed  Problem # 4:  NICOTINE ADDICTION (ICD-305.1)  Encouraged smoking cessation and discussed  different methods for smoking cessation.  Pt not yet ready to quit.  Will continue to encourage him to quit and will discuss again at his next appointment.  Complete Medication List: 1)  Vicodin 5-500 Mg Tabs (Hydrocodone-acetaminophen) .... Take 1 tablet every 4 hours for pain as needed. 2)  Lisinopril 20 Mg Tabs (Lisinopril) .... Take 1 tablet by mouth daily. 3)  Flonase 50 Mcg/act Susp (Fluticasone propionate) .... Once a day. 4)  Novolog 100 Unit/ml Soln (Insulin aspart) .... Ssi used as directed. 5)  Metformin Hcl 500 Mg Tabs (Metformin hcl) .... Take 1 tablet by mouth two times a day 6)  Levitra 20 Mg Tabs (Vardenafil hcl) .... Take 1/2 tablet by mouth 1 hour before activity once a day as needed  Patient Instructions: 1)  Please schedule a follow-up appointment in 3 months with Dr. Arvilla Market. 2)  Keep up the great work!  Prevention & Chronic Care Immunizations   Influenza vaccine: Not documented   Influenza vaccine deferral: Refused  (02/10/2009)    Tetanus booster: Not documented    Pneumococcal vaccine: Not documented  Colorectal Screening   Hemoccult: Not documented    Colonoscopy: Not documented  Other Screening   PSA: Not documented   Smoking status: current  (09/17/2009)    Smoking cessation counseling: no  (09/17/2009)  Diabetes Mellitus   HgbA1C: 6.2  (09/17/2009)   HgbA1C action/deferral: Ordered  (04/01/2009)   Hemoglobin A1C due: 04/02/2009    Eye exam: Not documented   Diabetic eye exam action/deferral: Deferred  (05/28/2009)   Eye exam due: 08/05/2009    Foot exam: Not documented   Foot exam action/deferral: Do today   High risk foot: Yes  (09/17/2009)   Foot care education: Done  (09/17/2009)    Urine microalbumin/creatinine ratio: Not documented  Lipids   Total Cholesterol: Not documented   Lipid panel action/deferral: Deferred   LDL: Not documented   LDL Direct: Not documented   HDL: Not documented   Triglycerides: Not documented   Lipid panel due: 04/30/2009  Hypertension   Last Blood Pressure: 161 / 81  (09/17/2009)   Serum creatinine: 1.03  (01/26/2009)   Serum potassium 5.1  (01/26/2009)   Basic metabolic panel due: 08/06/2009  Self-Management Support :   Personal Goals (by the next clinic visit) :     Personal A1C goal: 7  (02/10/2009)     Personal blood pressure goal: 130/80  (02/10/2009)     Personal LDL goal: 100  (02/10/2009)    Patient will work on the following items until the next clinic visit to reach self-care goals:     Medications and monitoring: examine my feet every day  (09/17/2009)     Eating: drink diet soda or water instead of juice or soda, eat more vegetables, use fresh or frozen vegetables, eat foods that are low in salt, eat baked foods instead of fried foods  (09/17/2009)    Diabetes self-management support: Copy of home glucose meter record, Education handout, Resources for patients handout, Written self-care plan  (09/17/2009)   Diabetes care plan printed   Diabetes education handout printed    Hypertension self-management support: Copy of home glucose meter record, Education handout, Resources for patients handout, Written self-care plan  (09/17/2009)   Hypertension self-care plan printed.    Hypertension education handout printed      Resource handout printed.   Nursing Instructions: Diabetic foot exam today     Laboratory Results   Blood Tests   Date/Time Received:  September 17, 2009 2:03 PM  Date/Time Reported: Burke Keels  September 17, 2009 2:03 PM   HGBA1C: 6.2%   (Normal Range: Non-Diabetic - 3-6%   Control Diabetic - 6-8%) CBG Random:: 120mg /dL

## 2010-04-05 NOTE — Miscellaneous (Signed)
Summary: Pharmacologist   Imported By: Florinda Marker 05/31/2009 16:45:04  _____________________________________________________________________  External Attachment:    Type:   Image     Comment:   External Document

## 2010-04-07 NOTE — Progress Notes (Signed)
Summary: refill/ hla  Phone Note Refill Request Message from:  Patient on March 18, 2010 12:18 PM  Refills Requested: Medication #1:  LISINOPRIL 20 MG TABS Take 1 tablet by mouth daily.   Dosage confirmed as above?Dosage Confirmed  Medication #2:  METFORMIN HCL 500 MG TABS Take 1 tablet by mouth two times a day   Dosage confirmed as above?Dosage Confirmed Initial call taken by: Marin Roberts RN,  March 18, 2010 12:18 PM  Follow-up for Phone Call        rx submitted to pharmacy Follow-up by: Nelda Bucks DO,  March 22, 2010 8:54 AM    Prescriptions: METFORMIN HCL 500 MG TABS (METFORMIN HCL) Take 1 tablet by mouth two times a day  #60 x 6   Entered and Authorized by:   Nelda Bucks DO   Signed by:   Nelda Bucks DO on 03/22/2010   Method used:   Electronically to        [facility] [address] (retail)       [address], [address]       [place], [place]  [address]       Ph: [phone]       Fax: [phone]   RxID:   [id number] LISINOPRIL 20 MG TABS (LISINOPRIL) Take 1 tablet by mouth daily.  #90 x 3   Entered and Authorized by:   Nelda Bucks DO   Signed by:   Nelda Bucks DO on 03/22/2010   Method used:   Electronically to        [facility] [address] (retail)       [address], [address]       [place], [place]  [address]       Ph: [phone]       Fax: [phone]   RxID:   [id number]

## 2010-04-07 NOTE — Letter (Signed)
Summary: [facility]   Imported By: Margie Billet 04/01/2010 16:29:02  _____________________________________________________________________  External Attachment:    Type:   Image     Comment:   External Document

## 2010-05-18 LAB — GLUCOSE, CAPILLARY: Glucose-Capillary: 142 mg/dL — ABNORMAL HIGH (ref 70–99)

## 2010-05-21 LAB — GLUCOSE, CAPILLARY: Glucose-Capillary: 120 mg/dL — ABNORMAL HIGH (ref 70–99)

## 2010-05-30 LAB — GLUCOSE, CAPILLARY: Glucose-Capillary: 118 mg/dL — ABNORMAL HIGH (ref 70–99)

## 2010-06-08 LAB — DIFFERENTIAL
Basophils Absolute: 0 10*3/uL (ref 0.0–0.1)
Basophils Absolute: 0 10*3/uL (ref 0.0–0.1)
Basophils Relative: 0 % (ref 0–1)
Basophils Relative: 0 % (ref 0–1)
Eosinophils Absolute: 0 10*3/uL (ref 0.0–0.7)
Eosinophils Absolute: 0 10*3/uL (ref 0.0–0.7)
Eosinophils Relative: 0 % (ref 0–5)
Eosinophils Relative: 0 % (ref 0–5)
Lymphocytes Relative: 12 % (ref 12–46)
Lymphocytes Relative: 8 % — ABNORMAL LOW (ref 12–46)
Lymphs Abs: 1.7 10*3/uL (ref 0.7–4.0)
Lymphs Abs: 2 10*3/uL (ref 0.7–4.0)
Monocytes Absolute: 1 10*3/uL (ref 0.1–1.0)
Monocytes Absolute: 1.5 10*3/uL — ABNORMAL HIGH (ref 0.1–1.0)
Monocytes Relative: 5 % (ref 3–12)
Monocytes Relative: 9 % (ref 3–12)
Neutro Abs: 13.3 10*3/uL — ABNORMAL HIGH (ref 1.7–7.7)
Neutro Abs: 18.1 10*3/uL — ABNORMAL HIGH (ref 1.7–7.7)
Neutrophils Relative %: 79 % — ABNORMAL HIGH (ref 43–77)
Neutrophils Relative %: 87 % — ABNORMAL HIGH (ref 43–77)

## 2010-06-08 LAB — GLUCOSE, CAPILLARY
Glucose-Capillary: 154 mg/dL — ABNORMAL HIGH (ref 70–99)
Glucose-Capillary: 154 mg/dL — ABNORMAL HIGH (ref 70–99)
Glucose-Capillary: 163 mg/dL — ABNORMAL HIGH (ref 70–99)
Glucose-Capillary: 177 mg/dL — ABNORMAL HIGH (ref 70–99)
Glucose-Capillary: 185 mg/dL — ABNORMAL HIGH (ref 70–99)
Glucose-Capillary: 185 mg/dL — ABNORMAL HIGH (ref 70–99)
Glucose-Capillary: 194 mg/dL — ABNORMAL HIGH (ref 70–99)
Glucose-Capillary: 198 mg/dL — ABNORMAL HIGH (ref 70–99)
Glucose-Capillary: 201 mg/dL — ABNORMAL HIGH (ref 70–99)
Glucose-Capillary: 225 mg/dL — ABNORMAL HIGH (ref 70–99)
Glucose-Capillary: 229 mg/dL — ABNORMAL HIGH (ref 70–99)
Glucose-Capillary: 247 mg/dL — ABNORMAL HIGH (ref 70–99)
Glucose-Capillary: 287 mg/dL — ABNORMAL HIGH (ref 70–99)
Glucose-Capillary: 291 mg/dL — ABNORMAL HIGH (ref 70–99)
Glucose-Capillary: 304 mg/dL — ABNORMAL HIGH (ref 70–99)
Glucose-Capillary: 175 mg/dL — ABNORMAL HIGH (ref 70–99)
Glucose-Capillary: 180 mg/dL — ABNORMAL HIGH (ref 70–99)
Glucose-Capillary: 181 mg/dL — ABNORMAL HIGH (ref 70–99)
Glucose-Capillary: 183 mg/dL — ABNORMAL HIGH (ref 70–99)
Glucose-Capillary: 192 mg/dL — ABNORMAL HIGH (ref 70–99)
Glucose-Capillary: 193 mg/dL — ABNORMAL HIGH (ref 70–99)
Glucose-Capillary: 201 mg/dL — ABNORMAL HIGH (ref 70–99)
Glucose-Capillary: 204 mg/dL — ABNORMAL HIGH (ref 70–99)
Glucose-Capillary: 206 mg/dL — ABNORMAL HIGH (ref 70–99)
Glucose-Capillary: 216 mg/dL — ABNORMAL HIGH (ref 70–99)
Glucose-Capillary: 224 mg/dL — ABNORMAL HIGH (ref 70–99)
Glucose-Capillary: 245 mg/dL — ABNORMAL HIGH (ref 70–99)
Glucose-Capillary: 249 mg/dL — ABNORMAL HIGH (ref 70–99)
Glucose-Capillary: 260 mg/dL — ABNORMAL HIGH (ref 70–99)
Glucose-Capillary: 261 mg/dL — ABNORMAL HIGH (ref 70–99)
Glucose-Capillary: 262 mg/dL — ABNORMAL HIGH (ref 70–99)
Glucose-Capillary: 262 mg/dL — ABNORMAL HIGH (ref 70–99)
Glucose-Capillary: 264 mg/dL — ABNORMAL HIGH (ref 70–99)
Glucose-Capillary: 267 mg/dL — ABNORMAL HIGH (ref 70–99)
Glucose-Capillary: 267 mg/dL — ABNORMAL HIGH (ref 70–99)
Glucose-Capillary: 267 mg/dL — ABNORMAL HIGH (ref 70–99)
Glucose-Capillary: 268 mg/dL — ABNORMAL HIGH (ref 70–99)
Glucose-Capillary: 273 mg/dL — ABNORMAL HIGH (ref 70–99)
Glucose-Capillary: 277 mg/dL — ABNORMAL HIGH (ref 70–99)
Glucose-Capillary: 281 mg/dL — ABNORMAL HIGH (ref 70–99)
Glucose-Capillary: 303 mg/dL — ABNORMAL HIGH (ref 70–99)
Glucose-Capillary: 321 mg/dL — ABNORMAL HIGH (ref 70–99)
Glucose-Capillary: 352 mg/dL — ABNORMAL HIGH (ref 70–99)
Glucose-Capillary: 352 mg/dL — ABNORMAL HIGH (ref 70–99)

## 2010-06-08 LAB — URINALYSIS, MICROSCOPIC ONLY
Bilirubin Urine: NEGATIVE
Glucose, UA: 100 mg/dL — AB
Ketones, ur: 15 mg/dL — AB
Leukocytes, UA: NEGATIVE
Nitrite: NEGATIVE
Protein, ur: >300 mg/dL — AB
Specific Gravity, Urine: 1.022 (ref 1.005–1.030)
Urobilinogen, UA: 1 mg/dL (ref 0.0–1.0)
pH: 5.5 (ref 5.0–8.0)

## 2010-06-08 LAB — BASIC METABOLIC PANEL
BUN: 10 mg/dL (ref 6–23)
BUN: 10 mg/dL (ref 6–23)
BUN: 11 mg/dL (ref 6–23)
BUN: 10 mg/dL (ref 6–23)
BUN: 10 mg/dL (ref 6–23)
BUN: 11 mg/dL (ref 6–23)
BUN: 11 mg/dL (ref 6–23)
BUN: 12 mg/dL (ref 6–23)
BUN: 15 mg/dL (ref 6–23)
BUN: 8 mg/dL (ref 6–23)
CO2: 29 mEq/L (ref 19–32)
CO2: 31 mEq/L (ref 19–32)
CO2: 34 mEq/L — ABNORMAL HIGH (ref 19–32)
CO2: 25 mEq/L (ref 19–32)
CO2: 28 mEq/L (ref 19–32)
CO2: 28 mEq/L (ref 19–32)
CO2: 28 mEq/L (ref 19–32)
CO2: 29 mEq/L (ref 19–32)
CO2: 29 mEq/L (ref 19–32)
CO2: 30 mEq/L (ref 19–32)
Calcium: 8.4 mg/dL (ref 8.4–10.5)
Calcium: 8.4 mg/dL (ref 8.4–10.5)
Calcium: 8.8 mg/dL (ref 8.4–10.5)
Calcium: 7.7 mg/dL — ABNORMAL LOW (ref 8.4–10.5)
Calcium: 7.9 mg/dL — ABNORMAL LOW (ref 8.4–10.5)
Calcium: 8 mg/dL — ABNORMAL LOW (ref 8.4–10.5)
Calcium: 8.1 mg/dL — ABNORMAL LOW (ref 8.4–10.5)
Calcium: 8.1 mg/dL — ABNORMAL LOW (ref 8.4–10.5)
Calcium: 8.2 mg/dL — ABNORMAL LOW (ref 8.4–10.5)
Calcium: 8.2 mg/dL — ABNORMAL LOW (ref 8.4–10.5)
Chloride: 96 mEq/L (ref 96–112)
Chloride: 98 mEq/L (ref 96–112)
Chloride: 99 mEq/L (ref 96–112)
Chloride: 101 mEq/L (ref 96–112)
Chloride: 89 mEq/L — ABNORMAL LOW (ref 96–112)
Chloride: 93 mEq/L — ABNORMAL LOW (ref 96–112)
Chloride: 95 mEq/L — ABNORMAL LOW (ref 96–112)
Chloride: 96 mEq/L (ref 96–112)
Chloride: 97 mEq/L (ref 96–112)
Chloride: 98 mEq/L (ref 96–112)
Creatinine, Ser: 0.89 mg/dL (ref 0.4–1.5)
Creatinine, Ser: 1.03 mg/dL (ref 0.4–1.5)
Creatinine, Ser: 1.08 mg/dL (ref 0.4–1.5)
Creatinine, Ser: 0.88 mg/dL (ref 0.4–1.5)
Creatinine, Ser: 1 mg/dL (ref 0.4–1.5)
Creatinine, Ser: 1.01 mg/dL (ref 0.4–1.5)
Creatinine, Ser: 1.02 mg/dL (ref 0.4–1.5)
Creatinine, Ser: 1.13 mg/dL (ref 0.4–1.5)
Creatinine, Ser: 1.13 mg/dL (ref 0.4–1.5)
Creatinine, Ser: 1.2 mg/dL (ref 0.4–1.5)
GFR calc Af Amer: >60 mL/min (ref >60)
GFR calc Af Amer: >60 mL/min (ref >60)
GFR calc Af Amer: >60 mL/min (ref >60)
GFR calc Af Amer: >60 mL/min (ref >60)
GFR calc Af Amer: >60 mL/min (ref >60)
GFR calc Af Amer: >60 mL/min (ref >60)
GFR calc Af Amer: >60 mL/min (ref >60)
GFR calc Af Amer: >60 mL/min (ref >60)
GFR calc Af Amer: >60 mL/min (ref >60)
GFR calc Af Amer: >60 mL/min (ref >60)
GFR calc non Af Amer: >60 mL/min (ref >60)
GFR calc non Af Amer: >60 mL/min (ref >60)
GFR calc non Af Amer: >60 mL/min (ref >60)
GFR calc non Af Amer: >60 mL/min (ref >60)
GFR calc non Af Amer: >60 mL/min (ref >60)
GFR calc non Af Amer: >60 mL/min (ref >60)
GFR calc non Af Amer: >60 mL/min (ref >60)
GFR calc non Af Amer: >60 mL/min (ref >60)
GFR calc non Af Amer: >60 mL/min (ref >60)
GFR calc non Af Amer: >60 mL/min (ref >60)
Glucose, Bld: 151 mg/dL — ABNORMAL HIGH (ref 70–99)
Glucose, Bld: 183 mg/dL — ABNORMAL HIGH (ref 70–99)
Glucose, Bld: 185 mg/dL — ABNORMAL HIGH (ref 70–99)
Glucose, Bld: 161 mg/dL — ABNORMAL HIGH (ref 70–99)
Glucose, Bld: 172 mg/dL — ABNORMAL HIGH (ref 70–99)
Glucose, Bld: 177 mg/dL — ABNORMAL HIGH (ref 70–99)
Glucose, Bld: 182 mg/dL — ABNORMAL HIGH (ref 70–99)
Glucose, Bld: 236 mg/dL — ABNORMAL HIGH (ref 70–99)
Glucose, Bld: 297 mg/dL — ABNORMAL HIGH (ref 70–99)
Glucose, Bld: 374 mg/dL — ABNORMAL HIGH (ref 70–99)
Potassium: 4.5 mEq/L (ref 3.5–5.1)
Potassium: 4.5 mEq/L (ref 3.5–5.1)
Potassium: 4.8 mEq/L (ref 3.5–5.1)
Potassium: 3.5 mEq/L (ref 3.5–5.1)
Potassium: 3.6 mEq/L (ref 3.5–5.1)
Potassium: 3.6 mEq/L (ref 3.5–5.1)
Potassium: 3.8 mEq/L (ref 3.5–5.1)
Potassium: 3.9 mEq/L (ref 3.5–5.1)
Potassium: 4 mEq/L (ref 3.5–5.1)
Potassium: 4 mEq/L (ref 3.5–5.1)
Sodium: 136 mEq/L (ref 135–145)
Sodium: 136 mEq/L (ref 135–145)
Sodium: 136 mEq/L (ref 135–145)
Sodium: 125 mEq/L — ABNORMAL LOW (ref 135–145)
Sodium: 130 mEq/L — ABNORMAL LOW (ref 135–145)
Sodium: 132 mEq/L — ABNORMAL LOW (ref 135–145)
Sodium: 134 mEq/L — ABNORMAL LOW (ref 135–145)
Sodium: 135 mEq/L (ref 135–145)
Sodium: 137 mEq/L (ref 135–145)
Sodium: 138 mEq/L (ref 135–145)

## 2010-06-08 LAB — CBC
HCT: 37 % — ABNORMAL LOW (ref 39.0–52.0)
HCT: 38.4 % — ABNORMAL LOW (ref 39.0–52.0)
HCT: 39.6 % (ref 39.0–52.0)
HCT: 40 % (ref 39.0–52.0)
HCT: 37.4 % — ABNORMAL LOW (ref 39.0–52.0)
HCT: 38.7 % — ABNORMAL LOW (ref 39.0–52.0)
HCT: 39.8 % (ref 39.0–52.0)
HCT: 40.3 % (ref 39.0–52.0)
HCT: 40.9 % (ref 39.0–52.0)
HCT: 41.7 % (ref 39.0–52.0)
HCT: 43.6 % (ref 39.0–52.0)
Hemoglobin: 12.6 g/dL — ABNORMAL LOW (ref 13.0–17.0)
Hemoglobin: 13.1 g/dL (ref 13.0–17.0)
Hemoglobin: 13.6 g/dL (ref 13.0–17.0)
Hemoglobin: 13.6 g/dL (ref 13.0–17.0)
Hemoglobin: 12.8 g/dL — ABNORMAL LOW (ref 13.0–17.0)
Hemoglobin: 13.3 g/dL (ref 13.0–17.0)
Hemoglobin: 13.8 g/dL (ref 13.0–17.0)
Hemoglobin: 14 g/dL (ref 13.0–17.0)
Hemoglobin: 14.1 g/dL (ref 13.0–17.0)
Hemoglobin: 14.2 g/dL (ref 13.0–17.0)
Hemoglobin: 14.8 g/dL (ref 13.0–17.0)
MCHC: 34 g/dL (ref 30.0–36.0)
MCHC: 34 g/dL (ref 30.0–36.0)
MCHC: 34 g/dL (ref 30.0–36.0)
MCHC: 34.5 g/dL (ref 30.0–36.0)
MCHC: 33.9 g/dL (ref 30.0–36.0)
MCHC: 34.1 g/dL (ref 30.0–36.0)
MCHC: 34.3 g/dL (ref 30.0–36.0)
MCHC: 34.3 g/dL (ref 30.0–36.0)
MCHC: 34.4 g/dL (ref 30.0–36.0)
MCHC: 34.7 g/dL (ref 30.0–36.0)
MCHC: 34.8 g/dL (ref 30.0–36.0)
MCV: 87.6 fL (ref 78.0–100.0)
MCV: 87.6 fL (ref 78.0–100.0)
MCV: 88 fL (ref 78.0–100.0)
MCV: 88.5 fL (ref 78.0–100.0)
MCV: 87.5 fL (ref 78.0–100.0)
MCV: 87.5 fL (ref 78.0–100.0)
MCV: 87.5 fL (ref 78.0–100.0)
MCV: 88.1 fL (ref 78.0–100.0)
MCV: 88.3 fL (ref 78.0–100.0)
MCV: 88.5 fL (ref 78.0–100.0)
MCV: 88.9 fL (ref 78.0–100.0)
Platelets: 487 10*3/uL — ABNORMAL HIGH (ref 150–400)
Platelets: 488 10*3/uL — ABNORMAL HIGH (ref 150–400)
Platelets: 538 10*3/uL — ABNORMAL HIGH (ref 150–400)
Platelets: 562 10*3/uL — ABNORMAL HIGH (ref 150–400)
Platelets: 244 10*3/uL (ref 150–400)
Platelets: 247 10*3/uL (ref 150–400)
Platelets: 297 10*3/uL (ref 150–400)
Platelets: 329 10*3/uL (ref 150–400)
Platelets: 376 10*3/uL (ref 150–400)
Platelets: 405 10*3/uL — ABNORMAL HIGH (ref 150–400)
Platelets: 420 10*3/uL — ABNORMAL HIGH (ref 150–400)
RBC: 4.19 MIL/uL — ABNORMAL LOW (ref 4.22–5.81)
RBC: 4.36 MIL/uL (ref 4.22–5.81)
RBC: 4.52 MIL/uL (ref 4.22–5.81)
RBC: 4.57 MIL/uL (ref 4.22–5.81)
RBC: 4.28 MIL/uL (ref 4.22–5.81)
RBC: 4.39 MIL/uL (ref 4.22–5.81)
RBC: 4.51 MIL/uL (ref 4.22–5.81)
RBC: 4.6 MIL/uL (ref 4.22–5.81)
RBC: 4.67 MIL/uL (ref 4.22–5.81)
RBC: 4.69 MIL/uL (ref 4.22–5.81)
RBC: 4.93 MIL/uL (ref 4.22–5.81)
RDW: 12.7 % (ref 11.5–15.5)
RDW: 12.8 % (ref 11.5–15.5)
RDW: 13 % (ref 11.5–15.5)
RDW: 13 % (ref 11.5–15.5)
RDW: 12.6 % (ref 11.5–15.5)
RDW: 12.7 % (ref 11.5–15.5)
RDW: 12.8 % (ref 11.5–15.5)
RDW: 12.9 % (ref 11.5–15.5)
RDW: 12.9 % (ref 11.5–15.5)
RDW: 13 % (ref 11.5–15.5)
RDW: 13.1 % (ref 11.5–15.5)
WBC: 13.8 10*3/uL — ABNORMAL HIGH (ref 4.0–10.5)
WBC: 14 10*3/uL — ABNORMAL HIGH (ref 4.0–10.5)
WBC: 14.3 10*3/uL — ABNORMAL HIGH (ref 4.0–10.5)
WBC: 14.7 10*3/uL — ABNORMAL HIGH (ref 4.0–10.5)
WBC: 15.4 10*3/uL — ABNORMAL HIGH (ref 4.0–10.5)
WBC: 15.6 10*3/uL — ABNORMAL HIGH (ref 4.0–10.5)
WBC: 16.2 10*3/uL — ABNORMAL HIGH (ref 4.0–10.5)
WBC: 16.9 10*3/uL — ABNORMAL HIGH (ref 4.0–10.5)
WBC: 17.4 10*3/uL — ABNORMAL HIGH (ref 4.0–10.5)
WBC: 18.1 10*3/uL — ABNORMAL HIGH (ref 4.0–10.5)
WBC: 20.8 10*3/uL — ABNORMAL HIGH (ref 4.0–10.5)

## 2010-06-08 LAB — CROSSMATCH
ABO/RH(D): A POS
Antibody Screen: NEGATIVE

## 2010-06-08 LAB — CULTURE, BLOOD (ROUTINE X 2)
Culture: NO GROWTH
Culture: NO GROWTH

## 2010-06-08 LAB — HEMOGLOBIN A1C
Hgb A1c MFr Bld: 10.1 % — ABNORMAL HIGH (ref 4.6–6.1)
Mean Plasma Glucose: 243 mg/dL

## 2010-06-08 LAB — CK: Total CK: 28 U/L (ref 7–232)

## 2010-06-08 LAB — HEPATIC FUNCTION PANEL
ALT: 55 U/L — ABNORMAL HIGH (ref 0–53)
AST: 53 U/L — ABNORMAL HIGH (ref 0–37)
Albumin: 2.2 g/dL — ABNORMAL LOW (ref 3.5–5.2)
Alkaline Phosphatase: 111 U/L (ref 39–117)
Bilirubin, Direct: 0.1 mg/dL (ref 0.0–0.3)
Indirect Bilirubin: 0.4 mg/dL (ref 0.3–0.9)
Total Bilirubin: 0.5 mg/dL (ref 0.3–1.2)
Total Protein: 6.7 g/dL (ref 6.0–8.3)

## 2010-06-08 LAB — COMPREHENSIVE METABOLIC PANEL
ALT: 86 U/L — ABNORMAL HIGH (ref 0–53)
AST: 50 U/L — ABNORMAL HIGH (ref 0–37)
Albumin: 1.9 g/dL — ABNORMAL LOW (ref 3.5–5.2)
Alkaline Phosphatase: 199 U/L — ABNORMAL HIGH (ref 39–117)
BUN: 12 mg/dL (ref 6–23)
CO2: 31 mEq/L (ref 19–32)
Calcium: 8.1 mg/dL — ABNORMAL LOW (ref 8.4–10.5)
Chloride: 95 mEq/L — ABNORMAL LOW (ref 96–112)
Creatinine, Ser: 0.99 mg/dL (ref 0.4–1.5)
GFR calc Af Amer: >60 mL/min (ref >60)
GFR calc non Af Amer: >60 mL/min (ref >60)
Glucose, Bld: 173 mg/dL — ABNORMAL HIGH (ref 70–99)
Potassium: 4 mEq/L (ref 3.5–5.1)
Sodium: 134 mEq/L — ABNORMAL LOW (ref 135–145)
Total Bilirubin: 0.6 mg/dL (ref 0.3–1.2)
Total Protein: 6.1 g/dL (ref 6.0–8.3)

## 2010-06-08 LAB — LIPID PANEL
Cholesterol: 146 mg/dL (ref 0–200)
HDL: <10 mg/dL — ABNORMAL LOW (ref >39)
Triglycerides: 400 mg/dL — ABNORMAL HIGH (ref <150)
VLDL: 80 mg/dL — ABNORMAL HIGH (ref 0–40)

## 2010-06-08 LAB — RAPID URINE DRUG SCREEN, HOSP PERFORMED
Amphetamines: NOT DETECTED
Barbiturates: NOT DETECTED
Benzodiazepines: NOT DETECTED
Cocaine: NOT DETECTED
Opiates: NOT DETECTED
Tetrahydrocannabinol: NOT DETECTED

## 2010-06-08 LAB — HIV ANTIBODY (ROUTINE TESTING W REFLEX): HIV: NONREACTIVE

## 2010-06-08 LAB — GAMMA GT: GGT: 90 U/L — ABNORMAL HIGH (ref 7–51)

## 2010-06-08 LAB — URINE CULTURE
Colony Count: NO GROWTH
Culture: NO GROWTH

## 2010-06-08 LAB — PROTIME-INR
INR: 1.28 (ref 0.00–1.49)
Prothrombin Time: 15.9 seconds — ABNORMAL HIGH (ref 11.6–15.2)

## 2010-06-08 LAB — APTT: aPTT: 35 seconds (ref 24–37)

## 2010-06-08 LAB — ABO/RH: ABO/RH(D): A POS

## 2010-06-08 LAB — HEPATITIS PANEL, ACUTE
HCV Ab: NEGATIVE
Hep A IgM: NEGATIVE
Hep B C IgM: NEGATIVE
Hepatitis B Surface Ag: NEGATIVE

## 2010-06-08 LAB — LACTIC ACID, PLASMA: Lactic Acid, Venous: 1.9 mmol/L (ref 0.5–2.2)

## 2010-07-08 ENCOUNTER — Ambulatory Visit (INDEPENDENT_AMBULATORY_CARE_PROVIDER_SITE_OTHER): Payer: Federal, State, Local not specified - PPO | Admitting: Internal Medicine

## 2010-07-08 ENCOUNTER — Encounter: Payer: Self-pay | Admitting: Internal Medicine

## 2010-07-08 VITALS — BP 145/91 | HR 72 | Temp 98.2°F | Ht 73.0 in | Wt 209.0 lb

## 2010-07-08 DIAGNOSIS — J302 Other seasonal allergic rhinitis: Secondary | ICD-10-CM

## 2010-07-08 DIAGNOSIS — E119 Type 2 diabetes mellitus without complications: Secondary | ICD-10-CM

## 2010-07-08 DIAGNOSIS — I10 Essential (primary) hypertension: Secondary | ICD-10-CM

## 2010-07-08 DIAGNOSIS — J309 Allergic rhinitis, unspecified: Secondary | ICD-10-CM

## 2010-07-08 LAB — POCT GLYCOSYLATED HEMOGLOBIN (HGB A1C): Hemoglobin A1C: 6.3

## 2010-07-08 LAB — GLUCOSE, CAPILLARY: Glucose-Capillary: 110 mg/dL — ABNORMAL HIGH (ref 70–99)

## 2010-07-08 MED ORDER — GLUCOSE BLOOD VI STRP: ORAL_STRIP | Status: DC

## 2010-07-08 MED ORDER — FREESTYLE LANCETS MISC: Status: DC

## 2010-07-08 MED ORDER — FLUTICASONE PROPIONATE 50 MCG/ACT NA SUSP: 1.0000 | Freq: Every day | NASAL | Status: DC

## 2010-07-08 NOTE — Progress Notes (Signed)
  Subjective:    Patient ID: [name], male    DOB: 04/15/1950, 60 y.o.   MRN: [id number]  HPI Please see the A&P for the status of the pt's chronic medical problems.  CBG median is 106.   [facility] contact: Brett Canales  [phone]    Review of Systems  Constitutional: Negative for fever, chills, diaphoresis, activity change, appetite change, fatigue and unexpected weight change.  HENT: Negative for hearing loss, mouth sores, trouble swallowing, neck pain and neck stiffness.   Eyes: Negative for pain and visual disturbance.  Respiratory: Negative for cough, shortness of breath and wheezing.   Cardiovascular: Negative for chest pain, palpitations and leg swelling.  Gastrointestinal: Negative for nausea, vomiting, abdominal pain, diarrhea and blood in stool.  Genitourinary: Negative for dysuria and hematuria.  Musculoskeletal: Negative for gait problem.  Skin: Negative for rash.  Neurological: Negative for dizziness, seizures, syncope, numbness and headaches.       Objective:   Physical Exam VItal signs reviewed and stable. GEN: No apparent distress.  Alert and oriented x 3.  Pleasant, conversant, and cooperative to exam. HEENT: head is autraumatic and normocephalic.  Neck is supple without palpable masses or lymphadenopathy.  No JVD or carotid bruits.  Vision intact.  EOMI.  PERRLA.  Sclerae anicteric.  Conjunctivae without pallor or injection. Mucous membranes are moist.  Oropharynx is without erythema, exudates, or other abnormal lesions.   RESP:  Lungs are clear to ascultation bilaterally with good air movement.  No wheezes, ronchi, or rubs. CARDIOVASCULAR: regular rate, normal rhythm.  Clear S1, S2, no murmurs, gallops, or rubs. ABDOMEN: soft, non-tender, non-distended.  Bowels sounds present in all quadrants and normoactive.  No palpable masses. EXT: warm and dry.  Peripheral pulses equal, intact, and +2 globally.  No clubbing or cyanosis. Left BKA noted SKIN: warm and dry with  normal turgor.  No rashes or abnormal lesions observed. NEURO: CN II-XII grossly intact.  Muscle strength +5/5 in bilateral upper and lower extremities.  Sensation is grossly intact.  No focal deficit.        Assessment & Plan:

## 2010-07-08 NOTE — Patient Instructions (Signed)
Take one metformin pill in the morning. We will get blood work at your next appointment and refer you for an eye exam. Keep taking all of your medicine as directed. Call the clinic at [phone] with any concerns or questions. Schedule a follow up appointment in 91mo. It was wonderful to see you!!!

## 2010-07-13 ENCOUNTER — Other Ambulatory Visit: Payer: Self-pay | Admitting: *Deleted

## 2010-07-13 ENCOUNTER — Other Ambulatory Visit: Payer: Self-pay | Admitting: Internal Medicine

## 2010-07-13 DIAGNOSIS — E119 Type 2 diabetes mellitus without complications: Secondary | ICD-10-CM

## 2010-07-13 MED ORDER — GLUCOSE BLOOD VI STRP: ORAL_STRIP | Status: DC

## 2010-07-13 NOTE — Telephone Encounter (Signed)
Pt./wife called and stated they cannot use Freestyle Lite strips; he has an Onetouch Ultra II meter.  Thanks

## 2010-07-13 NOTE — Telephone Encounter (Signed)
New rx called to [facility] pharmacy.

## 2010-07-13 NOTE — Telephone Encounter (Signed)
Chart updated to reflect appropriate strips; new script sent to pharmacy.

## 2010-07-19 NOTE — Assessment & Plan Note (Signed)
OFFICE VISIT   GEOFF, DACANAY  DOB:  15-Jun-1950                                       02/17/2009  ZOXWR#:[id number]   The patient returns for followup today.  He underwent a left below knee  amputation on 01/18/2009.   On exam today blood pressure is 123/75 in the left arm, temperature is  98, heart rate is 86 and regular.  He has a well-healed left below knee  amputation.  There is still some edema within this.  Staples were  removed from this today.  A shrinker was also prescribed for the  patient.  He will follow up on an as-needed basis.     Janetta Hora. Fields, MD  Electronically Signed   CEF/MEDQ  D:  02/18/2009  T:  02/18/2009  Job:  [id number]

## 2010-08-15 ENCOUNTER — Encounter: Payer: Self-pay | Admitting: Internal Medicine

## 2010-09-12 NOTE — Assessment & Plan Note (Signed)
BP remains slightly elevated above goal. If his systolic blood pressure is above 140 at his next office visit we will plan to increase his lisinopril to 40 mg daily.

## 2010-09-12 NOTE — Assessment & Plan Note (Signed)
Patient is doing a great job managing his diabetes. His median CBG is 106.  We will check a hemoglobin A1c today. We'll continue to decrease his metformin; we'll have him begin taking only one 500 mg tablet daily.  I have asked him to return in 3 months for review of his CBGs and repeat hemoglobin A1c. We'll we'll refer him to ophthalmology at that time and perform his annual diabetic foot exam.  Will also discuss pneumococcal vaccination.

## 2010-11-04 ENCOUNTER — Encounter: Payer: Self-pay | Admitting: Internal Medicine

## 2010-11-04 ENCOUNTER — Ambulatory Visit (INDEPENDENT_AMBULATORY_CARE_PROVIDER_SITE_OTHER): Payer: Federal, State, Local not specified - PPO | Admitting: Internal Medicine

## 2010-11-04 VITALS — BP 151/82 | HR 77 | Temp 97.0°F | Ht 73.0 in | Wt 208.4 lb

## 2010-11-04 DIAGNOSIS — N529 Male erectile dysfunction, unspecified: Secondary | ICD-10-CM

## 2010-11-04 DIAGNOSIS — F17200 Nicotine dependence, unspecified, uncomplicated: Secondary | ICD-10-CM

## 2010-11-04 DIAGNOSIS — I10 Essential (primary) hypertension: Secondary | ICD-10-CM

## 2010-11-04 DIAGNOSIS — E119 Type 2 diabetes mellitus without complications: Secondary | ICD-10-CM

## 2010-11-04 DIAGNOSIS — J309 Allergic rhinitis, unspecified: Secondary | ICD-10-CM

## 2010-11-04 DIAGNOSIS — J302 Other seasonal allergic rhinitis: Secondary | ICD-10-CM

## 2010-11-04 LAB — LIPID PANEL
Cholesterol: 231 mg/dL — ABNORMAL HIGH (ref 0–200)
HDL: 38 mg/dL — ABNORMAL LOW (ref >39)
LDL Cholesterol: 155 mg/dL — ABNORMAL HIGH (ref 0–99)
Total CHOL/HDL Ratio: 6.1 Ratio
Triglycerides: 188 mg/dL — ABNORMAL HIGH (ref <150)
VLDL: 38 mg/dL (ref 0–40)

## 2010-11-04 LAB — COMPREHENSIVE METABOLIC PANEL
ALT: 20 U/L (ref 0–53)
AST: 20 U/L (ref 0–37)
Albumin: 4.1 g/dL (ref 3.5–5.2)
Alkaline Phosphatase: 52 U/L (ref 39–117)
BUN: 18 mg/dL (ref 6–23)
CO2: 30 mEq/L (ref 19–32)
Calcium: 9.3 mg/dL (ref 8.4–10.5)
Chloride: 104 mEq/L (ref 96–112)
Creat: 0.88 mg/dL (ref 0.50–1.35)
Glucose, Bld: 148 mg/dL — ABNORMAL HIGH (ref 70–99)
Potassium: 4.7 mEq/L (ref 3.5–5.3)
Sodium: 140 mEq/L (ref 135–145)
Total Bilirubin: 0.4 mg/dL (ref 0.3–1.2)
Total Protein: 6.3 g/dL (ref 6.0–8.3)

## 2010-11-04 LAB — POCT GLYCOSYLATED HEMOGLOBIN (HGB A1C): Hemoglobin A1C: 6.2

## 2010-11-04 LAB — GLUCOSE, CAPILLARY: Glucose-Capillary: 153 mg/dL — ABNORMAL HIGH (ref 70–99)

## 2010-11-04 MED ORDER — GLUCOSE BLOOD VI STRP: ORAL_STRIP | Status: AC

## 2010-11-04 MED ORDER — ONETOUCH ULTRASOFT LANCETS MISC: Status: AC

## 2010-11-04 MED ORDER — FLUTICASONE PROPIONATE 50 MCG/ACT NA SUSP
NASAL | Status: DC
Start: 2010-11-04 — End: 2011-06-02

## 2010-11-04 MED ORDER — SILDENAFIL CITRATE 100 MG PO TABS: 100.0000 mg | ORAL_TABLET | Freq: Every day | ORAL | Status: DC | PRN

## 2010-11-04 NOTE — Assessment & Plan Note (Signed)
He continues to smoke but is hoping to cut down. He is not yet ready to quit. We'll continue to follow up on this with him.

## 2010-11-04 NOTE — Assessment & Plan Note (Signed)
Patient's diabetes is very well controlled within hemoglobin A1c today is 6.2. He has been taking only a single tablet of metformin for the last 3 months. He is doing very well maintaining a healthy lifestyle including regular physical activity as well as a healthy diet. It is reasonable to see if he is able to maintain good control without medications. Will stop metformin today. He is advised to continue checking his blood sugar regularly and to contact me if he has persistent CBGs greater than 200 250. I will follow him up in 3 months for a repeat hemoglobin A1c. Will perform annual diabetic foot exam today and refer him to ophthalmology for an annual eye exam

## 2010-11-04 NOTE — Progress Notes (Signed)
  Subjective:    Patient ID: [name], male    DOB: 11/25/1950, 60 y.o.   MRN: [id number]  HPI  Please see the A&P for the status of the pt's chronic medical problems.     Review of Systems  Constitutional: Negative for fever, chills, diaphoresis, activity change, appetite change, fatigue and unexpected weight change.  HENT: Negative for hearing loss, mouth sores, trouble swallowing, neck pain and neck stiffness.   Eyes: Negative for pain and visual disturbance.  Respiratory: Negative for cough, shortness of breath and wheezing.   Cardiovascular: Negative for chest pain, palpitations and leg swelling.  Gastrointestinal: Negative for nausea, vomiting, abdominal pain, diarrhea and blood in stool.  Genitourinary: Negative for dysuria and hematuria.  Musculoskeletal: Negative for gait problem.  Skin: Negative for rash.  Neurological: Negative for dizziness, seizures, syncope, numbness and headaches.       Objective:   Physical Exam  VItal signs reviewed and stable.  Blood pressure slightly elevated above goal GEN: No apparent distress.  Alert and oriented x 3.  Pleasant, conversant, and cooperative to exam. HEENT: head is autraumatic and normocephalic.  Neck is supple without palpable masses or lymphadenopathy.  No JVD or carotid bruits.  Vision intact.  EOMI.  PERRLA.  Sclerae anicteric.  Conjunctivae without pallor or injection. Mucous membranes are moist.  Oropharynx is without erythema, exudates, or other abnormal lesions.   RESP:  Lungs are clear to ascultation bilaterally with good air movement.  No wheezes, ronchi, or rubs. CARDIOVASCULAR: regular rate, normal rhythm.  Clear S1, S2, no murmurs, gallops, or rubs. ABDOMEN: soft, non-tender, non-distended.  Bowels sounds present in all quadrants and normoactive.  No palpable masses. EXT: warm and dry.  Peripheral pulses equal, intact, and +2 globally.  No clubbing or cyanosis. Left BKA noted SKIN: warm and dry with normal  turgor.  No rashes or abnormal lesions observed. NEURO: CN II-XII grossly intact.  Muscle strength +5/5 in bilateral upper and lower extremities.  Sensation is grossly intact.  No focal deficit.        Assessment & Plan:

## 2010-11-04 NOTE — Assessment & Plan Note (Signed)
Patient's blood pressure is slightly above goal. He does note some increased anxiety regarding a needle sticks. His last appointment his blood pressure significantly improved on repeat check. I have asked him to check his blood pressure at home over the next few months until his next office visit and to bring a record of this with him at that time. If his blood pressure remains elevated will need to increase his lisinopril however if he is normotensive we'll not make any changes.

## 2010-11-04 NOTE — Patient Instructions (Signed)
Schedule a followup appointment with Dr. Arvilla Market in 3 months.  Stop taking your metformin. Continue to check your blood sugars. If they remain consistently elevated above 200-250, please call Dr. Arvilla Market. Please check your blood pressure at home and write the numbers down; bring these numbers with you to your next appointment. Continue to take all of your other medications as directed. I will call you if any of your lab work is abnormal.

## 2011-02-17 ENCOUNTER — Encounter: Payer: Self-pay | Admitting: Internal Medicine

## 2011-02-17 ENCOUNTER — Ambulatory Visit (INDEPENDENT_AMBULATORY_CARE_PROVIDER_SITE_OTHER): Payer: Federal, State, Local not specified - PPO | Admitting: Internal Medicine

## 2011-02-17 VITALS — BP 171/76 | HR 75 | Temp 96.6°F | Ht 73.0 in | Wt 216.6 lb

## 2011-02-17 DIAGNOSIS — E119 Type 2 diabetes mellitus without complications: Secondary | ICD-10-CM

## 2011-02-17 DIAGNOSIS — I10 Essential (primary) hypertension: Secondary | ICD-10-CM

## 2011-02-17 DIAGNOSIS — F17200 Nicotine dependence, unspecified, uncomplicated: Secondary | ICD-10-CM

## 2011-02-17 LAB — GLUCOSE, CAPILLARY: Glucose-Capillary: 168 mg/dL — ABNORMAL HIGH (ref 70–99)

## 2011-02-17 LAB — POCT GLYCOSYLATED HEMOGLOBIN (HGB A1C): Hemoglobin A1C: 7

## 2011-02-17 MED ORDER — LISINOPRIL 20 MG PO TABS: 20.0000 mg | ORAL_TABLET | Freq: Every day | ORAL | Status: DC

## 2011-02-17 NOTE — Assessment & Plan Note (Addendum)
Pt reports 2 high CBG measurements of 240 after eating pimento cheese; remaining readings ranging between 85-140.  Median reading is 108 per meter printout.  He has been off of metformin for 3 months. A1c today is within normal limits at 7.0 however this is a significant change from previous measurements of 6.2 and 6.3.  Encouraged patient to be vigilant about diet and exercise and to avoid any additional weight gain.  Will have him return in 3 months for a recheck of A1c; if it is elevated above 7 we'll plan to resume metformin 500 mg once daily; patient agrees with this plan.

## 2011-02-17 NOTE — Patient Instructions (Signed)
Schedule followup visit with Dr. Arvilla Market in 3 months. Continue to take your medications as directed.

## 2011-02-17 NOTE — Assessment & Plan Note (Signed)
Patient continues to smoke although he is trying to cut down the amount he smokes.  He is very interested in quitting however is not quite ready to do so yet.  We'll followup on this with him at his next office visit and review various methods of smoking cessation.

## 2011-02-17 NOTE — Progress Notes (Signed)
  Subjective:    Patient ID: [name], male    DOB: 27-Jan-1951, 60 y.o.   MRN: [id number]  HPI Please see the A&P for the status of the pt's chronic medical problems.   Review of Systems  Constitutional: Negative for fever, chills, diaphoresis, activity change, appetite change, fatigue and unexpected weight change.  HENT: Negative for hearing loss, mouth sores, trouble swallowing, neck pain and neck stiffness.   Eyes: Negative for pain and visual disturbance.  Respiratory: Negative for cough, shortness of breath and wheezing.   Cardiovascular: Negative for chest pain, palpitations and leg swelling.  Gastrointestinal: Negative for nausea, vomiting, abdominal pain, diarrhea and blood in stool.  Genitourinary: Negative for dysuria and hematuria.  Musculoskeletal: Negative for gait problem.  Skin: Negative for rash.  Neurological: Negative for dizziness, seizures, syncope, numbness and headaches.       Objective:   Physical Exam VItal signs reviewed and stable.  Blood pressure slightly elevated above goal GEN: No apparent distress.  Alert and oriented x 3.  Pleasant, conversant, and cooperative to exam. HEENT: head is autraumatic and normocephalic.  Neck is supple without palpable masses or lymphadenopathy.  No JVD or carotid bruits.  Vision intact.  EOMI.  PERRLA.  Sclerae anicteric.  Conjunctivae without pallor or injection. Mucous membranes are moist.  Oropharynx is without erythema, exudates, or other abnormal lesions.   RESP:  Lungs are clear to ascultation bilaterally with good air movement.  No wheezes, ronchi, or rubs. CARDIOVASCULAR: regular rate, normal rhythm.  Clear S1, S2, no murmurs, gallops, or rubs. ABDOMEN: soft, non-tender, non-distended.  Bowels sounds present in all quadrants and normoactive.  No palpable masses. EXT: warm and dry.  Peripheral pulses equal, intact, and +2 globally.  No clubbing or cyanosis. Left BKA noted SKIN: warm and dry with normal turgor.   No rashes or abnormal lesions observed. NEURO: CN II-XII grossly intact.  Muscle strength +5/5 in bilateral upper and lower extremities.  Sensation is grossly intact.  No focal deficit.        Assessment & Plan:

## 2011-02-17 NOTE — Assessment & Plan Note (Signed)
Blood pressure obtained in the office today is significantly elevated with systolic measurements of 171however the patient has been maintaining a blood pressure log at home.  Review of this reveals systolic measurements averaging in the 130s and no higher than a single reading of 141.  He is measurements are within acceptable limits. Advised him to continue measuring and recording his blood pressure at home and to bring his log again with him to his next appointment. Will not increase his lisinopril at this time.  We will repeat basic metabolic panel at his next office visit.

## 2011-05-05 ENCOUNTER — Ambulatory Visit (INDEPENDENT_AMBULATORY_CARE_PROVIDER_SITE_OTHER): Payer: Federal, State, Local not specified - PPO | Admitting: Internal Medicine

## 2011-05-05 ENCOUNTER — Encounter: Payer: Self-pay | Admitting: Internal Medicine

## 2011-05-05 VITALS — BP 165/82 | HR 75 | Temp 97.1°F | Ht 73.0 in | Wt 217.5 lb

## 2011-05-05 DIAGNOSIS — I10 Essential (primary) hypertension: Secondary | ICD-10-CM

## 2011-05-05 DIAGNOSIS — E119 Type 2 diabetes mellitus without complications: Secondary | ICD-10-CM

## 2011-05-05 DIAGNOSIS — Z79899 Other long term (current) drug therapy: Secondary | ICD-10-CM

## 2011-05-05 LAB — GLUCOSE, CAPILLARY: Glucose-Capillary: 161 mg/dL — ABNORMAL HIGH (ref 70–99)

## 2011-05-05 LAB — POCT GLYCOSYLATED HEMOGLOBIN (HGB A1C): Hemoglobin A1C: 6.8

## 2011-05-05 MED ORDER — LISINOPRIL 30 MG PO TABS: 30.0000 mg | ORAL_TABLET | Freq: Every day | ORAL | Status: DC

## 2011-05-05 NOTE — Assessment & Plan Note (Signed)
[name] has been completely off of metformin since his last visit. His hemoglobin A1c today is 6.8.  His diabetes is very well controlled with diet and exercise.  I will remove metformin from his medication list today.

## 2011-05-05 NOTE — Progress Notes (Signed)
Patient ID: [name], male   DOB: November 15, 1950, 61 y.o.   MRN: [id number]  HPI Please see the A&P for the status of the pt's chronic medical problems.   Review of Systems  Constitutional: Negative for fever, chills, diaphoresis, activity change, appetite change, fatigue and unexpected weight change.  HENT: Negative for hearing loss, mouth sores, trouble swallowing, neck pain and neck stiffness.   Eyes: Negative for pain and visual disturbance.  Respiratory: Negative for cough, shortness of breath and wheezing.   Cardiovascular: Negative for chest pain, palpitations and leg swelling.  Gastrointestinal: Negative for nausea, vomiting, abdominal pain, diarrhea and blood in stool.  Genitourinary: Negative for dysuria and hematuria.  Musculoskeletal: Negative for gait problem.  Skin: Negative for rash.  Neurological: Negative for dizziness, seizures, syncope, numbness and headaches.       Objective:   Physical Exam VItal signs reviewed and stable.  Blood pressure slightly elevated above goal GEN: No apparent distress.  Alert and oriented x 3.  Pleasant, conversant, and cooperative to exam. HEENT: head is autraumatic and normocephalic.  Neck is supple without palpable masses or lymphadenopathy.  No JVD or carotid bruits.  Vision intact.  EOMI.  PERRLA.  Sclerae anicteric.  Conjunctivae without pallor or injection. Mucous membranes are moist.  Oropharynx is without erythema, exudates, or other abnormal lesions.   RESP:  Lungs are clear to ascultation bilaterally with good air movement.  No wheezes, ronchi, or rubs. CARDIOVASCULAR: regular rate, normal rhythm.  Clear S1, S2, no murmurs, gallops, or rubs. ABDOMEN: soft, non-tender, non-distended.  Bowels sounds present in all quadrants and normoactive.  No palpable masses. EXT: warm and dry.  Peripheral pulses equal, intact, and +2 globally.  No clubbing or cyanosis. Left BKA noted SKIN: warm and dry with normal turgor.  No rashes or abnormal  lesions observed. NEURO: CN II-XII grossly intact.  Muscle strength +5/5 in bilateral upper and lower extremities.  Sensation is grossly intact.  No focal deficit.        Assessment & Plan:

## 2011-05-05 NOTE — Patient Instructions (Signed)
Schedule an appointment with Dr. Arvilla Market in May.

## 2011-05-17 NOTE — Assessment & Plan Note (Signed)
BP remains slightly above goal. Will increase his lisinopril to 30 mg daily. Will have him followup in May for blood pressure check. Will obtain a basic metabolic panel at that time to assess electrolytes status and renal function.

## 2011-05-29 ENCOUNTER — Encounter: Payer: Self-pay | Admitting: Internal Medicine

## 2011-05-29 ENCOUNTER — Ambulatory Visit (INDEPENDENT_AMBULATORY_CARE_PROVIDER_SITE_OTHER): Payer: Federal, State, Local not specified - PPO | Admitting: Internal Medicine

## 2011-05-29 VITALS — BP 155/77 | HR 77 | Temp 97.5°F | Ht 73.0 in | Wt 214.1 lb

## 2011-05-29 DIAGNOSIS — E119 Type 2 diabetes mellitus without complications: Secondary | ICD-10-CM

## 2011-05-29 DIAGNOSIS — S91309A Unspecified open wound, unspecified foot, initial encounter: Secondary | ICD-10-CM

## 2011-05-29 DIAGNOSIS — X58XXXA Exposure to other specified factors, initial encounter: Secondary | ICD-10-CM

## 2011-05-29 DIAGNOSIS — S91301A Unspecified open wound, right foot, initial encounter: Secondary | ICD-10-CM | POA: Insufficient documentation

## 2011-05-29 LAB — GLUCOSE, CAPILLARY: Glucose-Capillary: 217 mg/dL — ABNORMAL HIGH (ref 70–99)

## 2011-05-29 NOTE — Progress Notes (Signed)
Subjective:     Patient ID: [name], male   DOB: Sep 04, 1950, 61 y.o.   MRN: [id number]  HPI Patient is a 61 y/o M here today with c/o draining right great toe lesion. He first noticed some swelling, pain, and redness in his toe ~ 3 days PTA.  This began after a few days of hard work and increased time spent on his feet.  He notes the area became progressively red with a tiny area of whitish-blue discoloration at the tip of his tow. He has been soaking his feet in salt water and reports spontaneous drainage of purulent material that began on the day prior to his OV; reports 2-3 tsp of drainage. On Sunday evening (1day PTA) he went to urgent care for evaluation and was prescribed a 10 day course of clindamycin.  He notes the bluish discoloration is resolved and the redness seems improved.  He continues to experience intermittent drainage of purulent material.  He denies fever, chills, nausea, vomiting, diarrhea, or other associated symptom.  Review of Systems Review of Systems  Constitutional: Negative for fever, chills, diaphoresis, activity change, appetite change, fatigue and unexpected weight change.  HENT: Negative for hearing loss, congestion and neck stiffness.   Eyes: Negative for photophobia, pain and visual disturbance.  Respiratory: Negative for cough, chest tightness, shortness of breath and wheezing.   Cardiovascular: Negative for chest pain and palpitations.  Gastrointestinal: Negative for abdominal pain, blood in stool and anal bleeding.  Genitourinary: Negative for dysuria, hematuria and difficulty urinating.  Musculoskeletal: pertinent items noted in hpi Neurological: Negative for dizziness, syncope, speech difficulty, weakness, numbness and headaches.      Objective:   Physical Exam Vital signs reviewed GEN: No apparent distress.  Alert and oriented x 3.  Pleasant, conversant, and cooperative to exam. HEENT: head is autraumatic and normocephalic.  Neck is supple without  palpable masses or lymphadenopathy.  No JVD or carotid bruits.  Vision intact.  EOMI.  PERRLA.  Sclerae anicteric.  Conjunctivae without pallor or injection. Mucous membranes are moist.  Oropharynx is without erythema, exudates, or other abnormal lesions.   RESP:  Lungs are clear to ascultation bilaterally with good air movement.  No wheezes, ronchi, or rubs. CARDIOVASCULAR: regular rate, normal rhythm.  Clear S1, S2, no murmurs, gallops, or rubs. EXT: warm and dry.  Right PT and DP pulses are intact and +1.  Good capillary refill noted in RLE.  Onychomycosis and clubbing of right toenails noted.  Increased erythema, warmth, and edema of right great toe extending approximately 1-2 inches into dorsal aspect of foot.  Right great toe is indurated it is slightly tender to palpation; there is no localized area of fluctuance.  Mild amount of purulent material draining from under toenail of right great toe expressed on exam.  Evidence of purulent drainage noted in sock.No cyanosis noted. Left BKA noted     Assessment/plan:

## 2011-05-29 NOTE — Assessment & Plan Note (Signed)
Patient has an open draining wound present on his right great toe with surrounding cellulitis of his entire right great toe extending to the dorsal aspect of his foot.  Capillary refill is good and distal pulses intact; no cyanosis observed on exam.  I'm concerned about the prospect of osteomyelitis with sinus tract formation; he is at increased risk for this given his diabetes and  vascular disease.  He is currently afebrile, and hemodynamically stable with no signs or symptoms of systemic infection.  Will order and MRI to evaluate for abscess formation and/or osteomyelitis involving the right great toe and right foot.  Advised patient to continue taking clindamycin as directed for the time being.  Will check ESR and CRP today as well.  If there is evidence of osteomyelitis, will need to bring patient back to discuss IV antibiotics and surgical intervention (debridement vs amputation); briefly discussed this with patient and his wife today.  Hopefully, will have MRI results tomorrow.

## 2011-05-29 NOTE — Patient Instructions (Signed)
We will schedule you for an MRI of your foot. Keep taking the clindamycin as directed. Keep soaking your foot for 20-57min four times a day. If you develop any sweats or shaking chills, check your temperature. If you develop severe pain, fever >100, inability to walk, blue/black discoloration of your toe, or other concerning symptom, schedule an appointment or go to the nearest ER. I will call you as soon as I have results from your tests.  Please don't hesitate to call anytime.

## 2011-05-30 ENCOUNTER — Telehealth: Payer: Self-pay | Admitting: Internal Medicine

## 2011-05-30 ENCOUNTER — Ambulatory Visit (HOSPITAL_COMMUNITY)
Admission: RE | Admit: 2011-05-30 | Discharge: 2011-05-30 | Disposition: A | Discharge status: Home / Self Care | Payer: Federal, State, Local not specified - PPO | Source: Ambulatory Visit | Attending: Internal Medicine | Admitting: Internal Medicine

## 2011-05-30 ENCOUNTER — Other Ambulatory Visit: Payer: Self-pay | Admitting: Internal Medicine

## 2011-05-30 DIAGNOSIS — E1169 Type 2 diabetes mellitus with other specified complication: Secondary | ICD-10-CM | POA: Insufficient documentation

## 2011-05-30 DIAGNOSIS — M9080 Osteopathy in diseases classified elsewhere, unspecified site: Secondary | ICD-10-CM | POA: Insufficient documentation

## 2011-05-30 DIAGNOSIS — S91301A Unspecified open wound, right foot, initial encounter: Secondary | ICD-10-CM

## 2011-05-30 DIAGNOSIS — M869 Osteomyelitis, unspecified: Secondary | ICD-10-CM | POA: Insufficient documentation

## 2011-05-30 LAB — SEDIMENTATION RATE: Sed Rate: 53 mm/hr — ABNORMAL HIGH (ref 0–16)

## 2011-05-30 LAB — CREATININE, SERUM
Creatinine, Ser: 0.82 mg/dL (ref 0.50–1.35)
GFR calc Af Amer: >90 mL/min (ref >90)
GFR calc non Af Amer: >90 mL/min (ref >90)

## 2011-05-30 LAB — C-REACTIVE PROTEIN: CRP: 6.29 mg/dL — ABNORMAL HIGH (ref <0.60)

## 2011-05-30 MED ORDER — GADOBENATE DIMEGLUMINE 529 MG/ML IV SOLN
20.0000 mL | Freq: Once | INTRAVENOUS | Status: AC
  Administered 2011-05-30: 20 mL via INTRAVENOUS

## 2011-05-30 NOTE — Telephone Encounter (Signed)
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  Patient reports toe appears better; reports improved redness and swelling.  He has not experienced any fevers, rigors, worsening pain, or other signs of systemic illness. Informed patient that antibiotics alone are unlikely to result in a cure of bone infection.  Informed patient needs to be seen by an orthopedic surgeon to be evaluated for debridement and possible amputation.  Discussed plans for attempt at outpatient workup/treatment vs hospital admission tonight.  Patient prefers to attempt outpatient workup.  He agrees to come to clinic tomorrow morning so that we may try to get him seen by ortho tomorrow am/pm.  Failing that, will attempt to contact ortho on call for evaluation in [facility] and recommendations.  If we cannot establish ortho f/u tomorrow, plan will be to admit patient for IV abx and ortho evaluation.  Discussed plan with patient and wife; bot are agreeable and plan to arrive at [facility] tomorrow at ~9am.  Preferred ortho providers:  [facility], [facility], [facility]

## 2011-05-31 ENCOUNTER — Telehealth: Payer: Self-pay | Admitting: Internal Medicine

## 2011-05-31 NOTE — Progress Notes (Signed)
Pt aware of appt Dr Victorino Dike 05/31/11 10AM per Dr Arvilla Market - info faxed. Stanton Kidney Jakeem Grape RN 05/31/11 9:15AM

## 2011-05-31 NOTE — Telephone Encounter (Signed)
Comment patient to discuss results of his visit with Dr. Victorino Dike (orthopedic surgery).  Plan is for [name] to go to the operating room Thursday 06/08/2011 2:00 PM for amputation of his right great toe.  Until that time, patient was instructed to continue his clindamycin. I agree with his plan as Mr. Lambert Mody has had improvement of his cellulitis with clindamycin and is afebrile and hemodynamically stable. He is advised to come to the emergency room should he develop fever, shaking chills, altered mental status, or other concerning symptom.

## 2011-06-02 ENCOUNTER — Encounter (HOSPITAL_COMMUNITY): Payer: Self-pay | Admitting: Pharmacy Technician

## 2011-06-05 ENCOUNTER — Telehealth: Payer: Self-pay | Admitting: *Deleted

## 2011-06-05 NOTE — Telephone Encounter (Signed)
Call from pt's wife asking if he should continue clindamycin, he will be out Wed at noon and surgery is scheduled for Thursday at 2:00. The 10 days of clindamycin is finished on Wed @ noon.  Pt's surgery is for osteomyelitis of great toe. Pt # [id number]  Please advise

## 2011-06-05 NOTE — Telephone Encounter (Signed)
Patient is scheduled for amputation of right great toe.  This will be the definitive treatment for his osteomyelitis.  He will be ok to complete his course of clindamycin on Wednesday afternoon before OR scheduled Thurs am.  There is no added benefit to continuation of abx for the few hours before surgery.

## 2011-06-06 NOTE — Telephone Encounter (Signed)
Pt has been informed and voices understanding 

## 2011-06-07 ENCOUNTER — Encounter (HOSPITAL_COMMUNITY)
Admission: RE | Admit: 2011-06-07 | Discharge: 2011-06-07 | Disposition: A | Discharge status: Home / Self Care | Payer: Federal, State, Local not specified - PPO | Source: Ambulatory Visit | Attending: Orthopedic Surgery | Admitting: Orthopedic Surgery

## 2011-06-07 ENCOUNTER — Other Ambulatory Visit: Payer: Self-pay

## 2011-06-07 ENCOUNTER — Encounter (HOSPITAL_COMMUNITY): Payer: Self-pay

## 2011-06-07 HISTORY — DX: Anxiety disorder, unspecified: F41.9

## 2011-06-07 LAB — BASIC METABOLIC PANEL
BUN: 19 mg/dL (ref 6–23)
CO2: 27 mEq/L (ref 19–32)
Calcium: 9.2 mg/dL (ref 8.4–10.5)
Chloride: 101 mEq/L (ref 96–112)
Creatinine, Ser: 0.8 mg/dL (ref 0.50–1.35)
GFR calc Af Amer: >90 mL/min (ref >90)
GFR calc non Af Amer: >90 mL/min (ref >90)
Glucose, Bld: 132 mg/dL — ABNORMAL HIGH (ref 70–99)
Potassium: 4.1 mEq/L (ref 3.5–5.1)
Sodium: 137 mEq/L (ref 135–145)

## 2011-06-07 LAB — CBC
HCT: 43.7 % (ref 39.0–52.0)
Hemoglobin: 14.9 g/dL (ref 13.0–17.0)
MCH: 27.9 pg (ref 26.0–34.0)
MCHC: 34.1 g/dL (ref 30.0–36.0)
MCV: 81.8 fL (ref 78.0–100.0)
Platelets: 400 10*3/uL (ref 150–400)
RBC: 5.34 MIL/uL (ref 4.22–5.81)
RDW: 12.8 % (ref 11.5–15.5)
WBC: 16.6 10*3/uL — ABNORMAL HIGH (ref 4.0–10.5)

## 2011-06-07 LAB — SURGICAL PCR SCREEN
MRSA, PCR: NEGATIVE
Staphylococcus aureus: NEGATIVE

## 2011-06-07 MED ORDER — CHLORHEXIDINE GLUCONATE 4 % EX LIQD: 60.0000 mL | Freq: Once | CUTANEOUS | Status: DC

## 2011-06-07 MED ORDER — SODIUM CHLORIDE 0.9 % IV SOLN: INTRAVENOUS | Status: DC

## 2011-06-07 NOTE — Pre-Procedure Instructions (Signed)
[address] JUMP  06/07/2011   Your procedure is scheduled on:  06/08/11  Report to [facility] at 1200 pm  Call this number if you have problems the morning of surgery: [phone]   Remember:   Do not eat food:After Midnight.  May have clear liquids: up to 4 Hours before arrival.  Clear liquids include soda, tea, black coffee, apple or grape juice, broth.  Take these medicines the morning of surgery with A SIP OF WATER: cleocin,flonase   Do not wear jewelry, make-up or nail polish.  Do not wear lotions, powders, or perfumes. You may wear deodorant.  Do not shave 48 hours prior to surgery.  Do not bring valuables to the hospital.  Contacts, dentures or bridgework may not be worn into surgery.  Leave suitcase in the car. After surgery it may be brought to your room.  For patients admitted to the hospital, checkout time is 11:00 AM the day of discharge.   Patients discharged the day of surgery will not be allowed to drive home.  Name and phone number of your driver: family  Special Instructions: CHG Shower Use Special Wash: 1/2 bottle night before surgery and 1/2 bottle morning of surgery.   Please read over the following fact sheets that you were given: Pain Booklet, MRSA Information and Surgical Site Infection Prevention

## 2011-06-08 ENCOUNTER — Encounter (HOSPITAL_COMMUNITY): Payer: Self-pay | Admitting: Anesthesiology

## 2011-06-08 ENCOUNTER — Encounter (HOSPITAL_COMMUNITY)
Admission: AD | Disposition: A | Discharge status: Home / Self Care | Payer: Self-pay | Source: Ambulatory Visit | Attending: Orthopedic Surgery

## 2011-06-08 ENCOUNTER — Encounter (HOSPITAL_COMMUNITY): Payer: Self-pay | Admitting: *Deleted

## 2011-06-08 ENCOUNTER — Inpatient Hospital Stay (HOSPITAL_COMMUNITY)
Admission: AD | Admit: 2011-06-08 | Discharge: 2011-06-12 | DRG: 225 | Disposition: A | Discharge status: Home / Self Care | Payer: Federal, State, Local not specified - PPO | Source: Ambulatory Visit | Attending: Orthopedic Surgery | Admitting: Orthopedic Surgery

## 2011-06-08 ENCOUNTER — Ambulatory Visit (HOSPITAL_COMMUNITY): Payer: Federal, State, Local not specified - PPO | Admitting: Anesthesiology

## 2011-06-08 ENCOUNTER — Encounter (HOSPITAL_COMMUNITY): Payer: Self-pay | Admitting: General Practice

## 2011-06-08 DIAGNOSIS — B965 Pseudomonas (aeruginosa) (mallei) (pseudomallei) as the cause of diseases classified elsewhere: Secondary | ICD-10-CM | POA: Diagnosis present

## 2011-06-08 DIAGNOSIS — Z01812 Encounter for preprocedural laboratory examination: Secondary | ICD-10-CM

## 2011-06-08 DIAGNOSIS — S88119A Complete traumatic amputation at level between knee and ankle, unspecified lower leg, initial encounter: Secondary | ICD-10-CM

## 2011-06-08 DIAGNOSIS — F17200 Nicotine dependence, unspecified, uncomplicated: Secondary | ICD-10-CM | POA: Diagnosis present

## 2011-06-08 DIAGNOSIS — S91301A Unspecified open wound, right foot, initial encounter: Secondary | ICD-10-CM

## 2011-06-08 DIAGNOSIS — M869 Osteomyelitis, unspecified: Secondary | ICD-10-CM

## 2011-06-08 DIAGNOSIS — Z7982 Long term (current) use of aspirin: Secondary | ICD-10-CM

## 2011-06-08 DIAGNOSIS — S98139A Complete traumatic amputation of one unspecified lesser toe, initial encounter: Secondary | ICD-10-CM

## 2011-06-08 DIAGNOSIS — E119 Type 2 diabetes mellitus without complications: Secondary | ICD-10-CM

## 2011-06-08 DIAGNOSIS — I10 Essential (primary) hypertension: Secondary | ICD-10-CM

## 2011-06-08 HISTORY — DX: Osteomyelitis, unspecified: M86.9

## 2011-06-08 HISTORY — DX: Type 2 diabetes mellitus without complications: E11.9

## 2011-06-08 HISTORY — DX: Other seasonal allergic rhinitis: J30.2

## 2011-06-08 HISTORY — PX: AMPUTATION: SHX166

## 2011-06-08 HISTORY — DX: Unspecified hearing loss, unspecified ear: H91.90

## 2011-06-08 HISTORY — PX: TOE AMPUTATION: SHX809

## 2011-06-08 HISTORY — DX: Unspecified osteoarthritis, unspecified site: M19.90

## 2011-06-08 LAB — GLUCOSE, CAPILLARY
Glucose-Capillary: 150 mg/dL — ABNORMAL HIGH (ref 70–99)
Glucose-Capillary: 134 mg/dL — ABNORMAL HIGH (ref 70–99)
Glucose-Capillary: 146 mg/dL — ABNORMAL HIGH (ref 70–99)
Glucose-Capillary: 120 mg/dL — ABNORMAL HIGH (ref 70–99)

## 2011-06-08 SURGERY — AMPUTATION, FOOT, RAY
Anesthesia: General | Site: Foot | Laterality: Right | Wound class: Dirty or Infected

## 2011-06-08 MED ORDER — SENNA 8.6 MG PO TABS
1.0000 | ORAL_TABLET | Freq: Two times a day (BID) | ORAL | Status: DC
  Administered 2011-06-09 (×2): 8.6 mg via ORAL
  Filled 2011-06-08 (×10): qty 1

## 2011-06-08 MED ORDER — BUPIVACAINE HCL (PF) 0.5 % IJ SOLN
INTRAMUSCULAR | Status: DC | PRN
  Administered 2011-06-08: 15 mL

## 2011-06-08 MED ORDER — BACITRACIN ZINC 500 UNIT/GM EX OINT
TOPICAL_OINTMENT | CUTANEOUS | Status: DC | PRN
  Administered 2011-06-08: 1 via TOPICAL

## 2011-06-08 MED ORDER — SODIUM CHLORIDE 0.9 % IV SOLN
INTRAVENOUS | Status: DC
  Administered 2011-06-08: 17:00:00 via INTRAVENOUS

## 2011-06-08 MED ORDER — OMEGA-3 FATTY ACIDS 1000 MG PO CAPS: 1.0000 g | ORAL_CAPSULE | Freq: Every day | ORAL | Status: DC

## 2011-06-08 MED ORDER — FLUTICASONE PROPIONATE 50 MCG/ACT NA SUSP
1.0000 | Freq: Every day | NASAL | Status: DC
  Administered 2011-06-09 – 2011-06-12 (×4): 1 via NASAL
  Filled 2011-06-08: qty 16

## 2011-06-08 MED ORDER — ONDANSETRON HCL 4 MG/2ML IJ SOLN: 4.0000 mg | Freq: Four times a day (QID) | INTRAMUSCULAR | Status: DC | PRN

## 2011-06-08 MED ORDER — HYDROCODONE-ACETAMINOPHEN 5-325 MG PO TABS: 1.0000 | ORAL_TABLET | ORAL | Status: DC | PRN

## 2011-06-08 MED ORDER — VANCOMYCIN HCL IN DEXTROSE 1-5 GM/200ML-% IV SOLN
1000.0000 mg | Freq: Three times a day (TID) | INTRAVENOUS | Status: DC
  Administered 2011-06-08 – 2011-06-10 (×5): 1000 mg via INTRAVENOUS
  Filled 2011-06-08 (×9): qty 200

## 2011-06-08 MED ORDER — METOCLOPRAMIDE HCL 5 MG/ML IJ SOLN: 5.0000 mg | Freq: Three times a day (TID) | INTRAMUSCULAR | Status: DC | PRN

## 2011-06-08 MED ORDER — LACTATED RINGERS IV SOLN
INTRAVENOUS | Status: DC
  Administered 2011-06-08: 13:00:00 via INTRAVENOUS

## 2011-06-08 MED ORDER — PROPOFOL 10 MG/ML IV EMUL
INTRAVENOUS | Status: DC | PRN
  Administered 2011-06-08: 120 ug/kg/min via INTRAVENOUS

## 2011-06-08 MED ORDER — ONDANSETRON HCL 4 MG/2ML IJ SOLN: 4.0000 mg | Freq: Once | INTRAMUSCULAR | Status: DC | PRN

## 2011-06-08 MED ORDER — LISINOPRIL 20 MG PO TABS
30.0000 mg | ORAL_TABLET | Freq: Every day | ORAL | Status: DC
  Administered 2011-06-08 – 2011-06-09 (×2): 30 mg via ORAL
  Filled 2011-06-08 (×3): qty 1

## 2011-06-08 MED ORDER — LIDOCAINE HCL (PF) 2 % IJ SOLN
INTRAMUSCULAR | Status: DC | PRN
  Administered 2011-06-08: 15 mL

## 2011-06-08 MED ORDER — 0.9 % SODIUM CHLORIDE (POUR BTL) OPTIME
TOPICAL | Status: DC | PRN
  Administered 2011-06-08: 1000 mL

## 2011-06-08 MED ORDER — FENTANYL CITRATE 0.05 MG/ML IJ SOLN
50.0000 ug | INTRAMUSCULAR | Status: DC | PRN
  Administered 2011-06-08: 100 ug via INTRAVENOUS

## 2011-06-08 MED ORDER — INSULIN ASPART 100 UNIT/ML ~~LOC~~ SOLN
0.0000 [IU] | Freq: Three times a day (TID) | SUBCUTANEOUS | Status: DC
  Administered 2011-06-08 – 2011-06-09 (×2): 2 [IU] via SUBCUTANEOUS
  Administered 2011-06-09 – 2011-06-10 (×4): 3 [IU] via SUBCUTANEOUS
  Administered 2011-06-11 (×2): 2 [IU] via SUBCUTANEOUS
  Administered 2011-06-11: 3 [IU] via SUBCUTANEOUS

## 2011-06-08 MED ORDER — OMEGA-3-ACID ETHYL ESTERS 1 G PO CAPS
1.0000 g | ORAL_CAPSULE | Freq: Every day | ORAL | Status: DC
  Administered 2011-06-09 – 2011-06-12 (×4): 1 g via ORAL
  Filled 2011-06-08 (×4): qty 1

## 2011-06-08 MED ORDER — METOCLOPRAMIDE HCL 10 MG PO TABS: 5.0000 mg | ORAL_TABLET | Freq: Three times a day (TID) | ORAL | Status: DC | PRN

## 2011-06-08 MED ORDER — VANCOMYCIN HCL IN DEXTROSE 1-5 GM/200ML-% IV SOLN
INTRAVENOUS | Status: AC
  Filled 2011-06-08: qty 200

## 2011-06-08 MED ORDER — ONDANSETRON HCL 4 MG PO TABS: 4.0000 mg | ORAL_TABLET | Freq: Four times a day (QID) | ORAL | Status: DC | PRN

## 2011-06-08 MED ORDER — ASPIRIN EC 81 MG PO TBEC
81.0000 mg | DELAYED_RELEASE_TABLET | Freq: Every day | ORAL | Status: DC
  Administered 2011-06-09 – 2011-06-12 (×4): 81 mg via ORAL
  Filled 2011-06-08 (×4): qty 1

## 2011-06-08 MED ORDER — LACTATED RINGERS IV SOLN
INTRAVENOUS | Status: DC | PRN
  Administered 2011-06-08: 14:00:00 via INTRAVENOUS

## 2011-06-08 MED ORDER — HYDROMORPHONE HCL PF 1 MG/ML IJ SOLN: 0.2500 mg | INTRAMUSCULAR | Status: DC | PRN

## 2011-06-08 MED ORDER — VANCOMYCIN HCL 1000 MG IV SOLR
1000.0000 mg | INTRAVENOUS | Status: DC | PRN
  Administered 2011-06-08: 1000 mg via INTRAVENOUS

## 2011-06-08 MED ORDER — FENTANYL CITRATE 0.05 MG/ML IJ SOLN
INTRAMUSCULAR | Status: DC | PRN
  Administered 2011-06-08: 125 ug via INTRAVENOUS

## 2011-06-08 MED ORDER — FENTANYL CITRATE 0.05 MG/ML IJ SOLN
INTRAMUSCULAR | Status: AC
  Filled 2011-06-08: qty 2

## 2011-06-08 MED ORDER — DOCUSATE SODIUM 100 MG PO CAPS
100.0000 mg | ORAL_CAPSULE | Freq: Two times a day (BID) | ORAL | Status: DC
  Administered 2011-06-09 (×2): 100 mg via ORAL
  Filled 2011-06-08 (×10): qty 1

## 2011-06-08 SURGICAL SUPPLY — 37 items
BANDAGE ELASTIC 4 VELCRO ST LF (GAUZE/BANDAGES/DRESSINGS) ×2 IMPLANT
BLADE LONG MED 31X9 (MISCELLANEOUS) ×2 IMPLANT
BNDG COHESIVE 4X5 TAN STRL (GAUZE/BANDAGES/DRESSINGS) ×2 IMPLANT
BNDG COHESIVE 6X5 TAN STRL LF (GAUZE/BANDAGES/DRESSINGS) ×2 IMPLANT
BNDG ESMARK 4X9 LF (GAUZE/BANDAGES/DRESSINGS) ×2 IMPLANT
CHLORAPREP W/TINT 26ML (MISCELLANEOUS) ×2 IMPLANT
CLOTH BEACON ORANGE TIMEOUT ST (SAFETY) ×2 IMPLANT
CUFF TOURNIQUET SINGLE 34IN LL (TOURNIQUET CUFF) IMPLANT
CUFF TOURNIQUET SINGLE 44IN (TOURNIQUET CUFF) IMPLANT
DRAPE U-SHAPE 47X51 STRL (DRAPES) ×4 IMPLANT
DRSG ADAPTIC 3X8 NADH LF (GAUZE/BANDAGES/DRESSINGS) ×2 IMPLANT
ELECT REM PT RETURN 9FT ADLT (ELECTROSURGICAL) ×2
ELECTRODE REM PT RTRN 9FT ADLT (ELECTROSURGICAL) ×1 IMPLANT
GLOVE BIO SURGEON STRL SZ8 (GLOVE) ×4 IMPLANT
GLOVE BIOGEL PI IND STRL 8 (GLOVE) ×1 IMPLANT
GLOVE BIOGEL PI INDICATOR 8 (GLOVE) ×1
GOWN PREVENTION PLUS XLARGE (GOWN DISPOSABLE) ×2 IMPLANT
GOWN STRL NON-REIN LRG LVL3 (GOWN DISPOSABLE) ×2 IMPLANT
KIT BASIN OR (CUSTOM PROCEDURE TRAY) ×2 IMPLANT
KIT ROOM TURNOVER OR (KITS) ×2 IMPLANT
MANIFOLD NEPTUNE II (INSTRUMENTS) ×2 IMPLANT
NS IRRIG 1000ML POUR BTL (IV SOLUTION) ×2 IMPLANT
PACK ORTHO EXTREMITY (CUSTOM PROCEDURE TRAY) ×2 IMPLANT
PAD ARMBOARD 7.5X6 YLW CONV (MISCELLANEOUS) ×4 IMPLANT
PAD CAST 4YDX4 CTTN HI CHSV (CAST SUPPLIES) ×1 IMPLANT
PADDING CAST COTTON 4X4 STRL (CAST SUPPLIES) ×1
SPONGE GAUZE 4X4 12PLY (GAUZE/BANDAGES/DRESSINGS) ×2 IMPLANT
SPONGE LAP 18X18 X RAY DECT (DISPOSABLE) ×2 IMPLANT
STAPLER VISISTAT 35W (STAPLE) IMPLANT
STOCKINETTE IMPERVIOUS LG (DRAPES) IMPLANT
SUCTION FRAZIER TIP 10 FR DISP (SUCTIONS) ×2 IMPLANT
SUT ETHILON 2 0 PSLX (SUTURE) IMPLANT
TOWEL OR 17X24 6PK STRL BLUE (TOWEL DISPOSABLE) ×2 IMPLANT
TOWEL OR 17X26 10 PK STRL BLUE (TOWEL DISPOSABLE) ×2 IMPLANT
TUBE CONNECTING 12X1/4 (SUCTIONS) ×2 IMPLANT
UNDERPAD 30X30 INCONTINENT (UNDERPADS AND DIAPERS) ×2 IMPLANT
WATER STERILE IRR 1000ML POUR (IV SOLUTION) ×2 IMPLANT

## 2011-06-08 NOTE — Transfer of Care (Signed)
Immediate Anesthesia Transfer of Care Note  Patient: [name]  Procedure(s) Performed: Procedure(s) (LRB): AMPUTATION RAY (Right)  Patient Location: PACU  Anesthesia Type: MAC and Spinal  Level of Consciousness: awake, alert , oriented and patient cooperative  Airway & Oxygen Therapy: Patient Spontanous Breathing and Patient connected to nasal cannula oxygen  Post-op Assessment: Report given to PACU RN, Post -op Vital signs reviewed and stable and Patient moving all extremities X 4  Post vital signs: Reviewed and stable  Complications: No apparent anesthesia complications

## 2011-06-08 NOTE — Consult Note (Signed)
INTERNAL MEDICINE TEACHING SERVICE CONSULT NOTE   Date: 06/08/2011  Patient name: [name] Medical record number: [id number] Date of birth: Oct 28, 1950 Age: 61 y.o. Gender: male PCP: Nelda Bucks, MD, MD  History of Present Illness: [name] is a 61 y.o.male with past medical history significant for diabetes mellitus, hypertension, previous left leg BKA . Internal medicine was consulted regarding diabetes and hypertension therapy after right great toe amputation this morning for osteomyelitis.    The patient originally had pain, swelling, redness  and purulent drainage of the rights great toe in the middle of March. Osteomyelitis was discovered on MRI of the right forefoot on 05/30/2011. Dr. Arvilla Market coordinated toe amputation after this result was found. he had been treated with clindamycin from March 24 until April 3.  [name] states that he is feeling well after surgery. He denies any pain. He did receive extensive local anesthesia, and his anesthesiologist told him this would wear off at approximately 10:50 PM. Thus far He has taken no medication for pain. He denies any associated nausea or GI upset. He had a regular bowel movement this morning. Urinating without difficulty.   With regards to his diabetes patient is diet controlled with hemoglobin A1c 6.8. Patient states that he occasionally cheats on his diet including the night prior to surgery. He states he had [employer] and a steak. This predictably has led to her that increased blood sugar with a maximum blood glucose of 150. Currently on sliding scale insulin coverage though this has yet to decrease his plasma glucose. The patient had been maintained on metformin therapy until August of 2012. since then he has been solelly diet controlled.  Review of Systems: Constitutional: Denies fever, chills, diaphoresis, appetite change and fatigue.  HEENT: Denies blurry vision or double vision. Endorses difficulty  hearing  Respiratory: Denies SOB, DOE and cough, Cardiovascular: Denies chest pain, palpitations and leg swelling.  Gastrointestinal: Denies nausea, vomiting, abdominal pain, diarrhea, constipation, blood in stool and abdominal distention.  Genitourinary: Denies dysuria and difficulty urinating.  Musculoskeletal: Denies myalgias, back pain, joint swelling, arthralgias and gait problem.  Skin: Bruising or rash Neurological: Endorses absence of the left lower leg and numbness of the right lower leg. Hematological: Denies bleeding  Psychiatric/Behavioral: Denies confusion  Past Medical History  Diagnosis Date  . Hypertension   . Gangrene of foot     Left, s/p KBA  . Anxiety   . Osteomyelitis of toe 06/08/11    right foot  . Seasonal allergies   . Type II diabetes mellitus     diet controlled  . Arthritis   . HOH (hard of hearing)     Past Surgical History  Procedure Date  . Leg amputation below knee 01/2009    left  . Pilonidal cyst / sinus excision 1970's  . Fracture surgery   . Ankle fracture surgery 1970    "crushed"  . Toe amputation 06/08/11    partial; right great toe    Meds: Medications Prior to Admission  Medication Dose Route Frequency Provider Last Rate Last Dose  . aspirin EC tablet 81 mg  81 mg Oral Daily Toni Arthurs, MD      . docusate sodium (COLACE) capsule 100 mg  100 mg Oral BID Toni Arthurs, MD      . fluticasone (FLONASE) 50 MCG/ACT nasal spray 1 spray  1 spray Each Nare Daily Toni Arthurs, MD      . gadobenate dimeglumine (MULTIHANCE) injection 20 mL  20 mL Intravenous Once Medication Radiologist, MD   20 mL at 05/30/11 1330  . HYDROcodone-acetaminophen (NORCO) 5-325 MG per tablet 1-2 tablet  1-2 tablet Oral Q4H PRN Toni Arthurs, MD      . insulin aspart (novoLOG) injection 0-15 Units  0-15 Units Subcutaneous TID WC Toni Arthurs, MD   2 Units at 06/08/11 1801  . lisinopril (PRINIVIL,ZESTRIL) 30 mg  30 mg Oral Daily Toni Arthurs, MD   30 mg at 06/08/11 1846  .  metoCLOPramide (REGLAN) tablet 5-10 mg  5-10 mg Oral Q8H PRN Toni Arthurs, MD       Or  . metoCLOPramide (REGLAN) injection 5-10 mg  5-10 mg Intravenous Q8H PRN Toni Arthurs, MD      . omega-3 acid ethyl esters (LOVAZA) capsule 1 g  1 g Oral Daily Toni Arthurs, MD      . ondansetron [facility]) tablet 4 mg  4 mg Oral Q6H PRN Toni Arthurs, MD       Or  . ondansetron [facility]) injection 4 mg  4 mg Intravenous Q6H PRN Toni Arthurs, MD      . Gwyndolyn Kaufman [facility]) tablet 8.6 mg  1 tablet Oral BID Toni Arthurs, MD      . vancomycin (VANCOCIN) IVPB 1000 mg/200 mL premix  1,000 mg Intravenous Q8H Crystal Salomon Fick, PHARMD       Medications Prior to Admission  Medication Sig Dispense Refill  . aspirin EC 81 MG tablet Take 81 mg by mouth daily.      . clindamycin (CLEOCIN) 300 MG capsule Take 300 mg by mouth 4 (four) times daily. Starting 05/28/11 for 10 days      . fish oil-omega-3 fatty acids 1000 MG capsule Take 1 g by mouth daily.      . fluticasone (FLONASE) 50 MCG/ACT nasal spray Place 1 spray into the nose daily. Spray one spray in each nostril every day. Please dispense a 3 month supply      . lisinopril (PRINIVIL,ZESTRIL) 30 MG tablet Take 1 tablet (30 mg total) by mouth daily.  90 tablet  1  . glucose blood (ONE TOUCH ULTRA TEST) test strip Use as instructed  100 each  12  . Lancets (ONETOUCH ULTRASOFT) lancets Use as instructed  100 each  12  . sildenafil (VIAGRA) 100 MG tablet Take 1 tablet (100 mg total) by mouth daily as needed. Take one tablet 30-60 min before activity.  10 tablet  5  Saw palmetto Echinacea Ginseng Fish oil  Allergies: Penicillins states had a life-threatening reaction when he was a young child  Family History  Problem Relation Age of Onset  . Prostate cancer Father     History   Social History  . Marital Status: Married    Spouse Name: N/A    Number of Children: N/A  . Years of Education: N/A   Occupational History  . Not on file.   Social History Main  Topics  . Smoking status: Former Smoker -- 1.5 packs/day for 22 years    Types: Cigarettes    Quit date: 05/31/2011  . Smokeless tobacco: Former Neurosurgeon    Types: Chew    Quit date: 03/07/1983  . Alcohol Use: Yes     06/08/11 "no liquor for 2 1/2 years; last beer 05/31/11"  . Drug Use: No  . Sexually Active: Yes   Other Topics Concern  . Not on file   Social History Narrative   Married    Physical Exam: Blood pressure 171/81, pulse  70, temperature 97.5 F (36.4 C), temperature source Oral, resp. rate 12, height 6' 0.84" (1.85 m), weight 209 lb 14.1 oz (95.2 kg), SpO2 99.00%. Gen: Well-developed, well-nourished male  in no acute distress; alert, and cooperative throughout examination. Head: Normocephalic, atraumatic. Eyes: PERRL, EOMI, No signs of anemia or jaundince. Nose: Mucous membranes moist, not inflammed, nonerythematous. Throat: Oropharynx nonerythematous, no exudate appreciated.  Neck: Supple with no deformities, masses, or tenderness noted.   Lungs: Normal respiratory effort. Clear to auscultation BL, without crackles or wheezes. Heart: RRR. S1 and S2 normal without  murmur, gallop,or rubs. Abdomen: BS normoactive. Soft, nondistended, non-tender. No masses or organomegaly. Extremities: No pretibial edema. 2+ radial pulses. Some clubbing of the fingernails.the right foot is bandaged to the ankle. The bandages clean dry and intact. There is a left lower extremity BKA.  Neurologic: A&O X3, CN II - XII are grossly intact. Motor strength is 5/5 in the all 4 extremities, Sensations intact to light touch with the exception of the right lower leg. No focal neurologic deficit Skin: No visible rashes, scars. Psych: mood and affect are normal.    Lab results: Basic Metabolic Panel:  Basename 06/07/11 1547  NA 137  K 4.1  CL 101  CO2 27  GLUCOSE 132*  BUN 19  CREATININE 0.80  CALCIUM 9.2  MG --  PHOS --   CBC:  Basename 06/07/11 1547  WBC 16.6*  NEUTROABS --  HGB 14.9    HCT 43.7  MCV 81.8  PLT 400   CBG:  Basename 06/08/11 1608 06/08/11 1506 06/08/11 1211  GLUCAP 146* 134* 150*   Lab Results  Component Value Date   HGBA1C 6.8 05/05/2011   Lab Results  Component Value Date   CHOL 231* 11/04/2010   HDL 38* 11/04/2010   LDLCALC 155* 11/04/2010   TRIG 188* 11/04/2010   CHOLHDL 6.1 11/04/2010      Misc. Labs:  Wound Cultures pending  Imaging results:  Dg Chest 2 View  06/07/2011  *RADIOLOGY REPORT*  Clinical Data: Preoperative examination (right hallux amputation); History of smoking  CHEST - 2 VIEW  Comparison: 01/12/2009; CT abdomen pelvis - 09/28/2009  Findings:  Grossly unchanged enlarged cardiac silhouette and mediastinal contours.  Improved inspiratory effort with interval decrease in bibasilar heterogeneous opacities.  Persistent mild diffuse interstitial thickening within the bilateral mid lungs, right greater than left.  No new focal airspace opacities.  No pleural effusion or pneumothorax.  No acute osseous abnormalities.  IMPRESSION: Improved inspiratory effort with persistent bibasilar opacities and interstitial markings likely due to the atelectasis/scar and chronic bronchitic change.  No acute cardiopulmonary disease.  Original Report Authenticated By: [name], M.D.     Assessment & Plan by Problem:  1) diabetes mellitus: Overall the patient's diabetes has been very well managed by Dr. Arvilla Market. His hemoglobin A1c is 6.8 and is currently controlled with diet only. This is likely to have some increased stress response after surgery recommend sliding scale insulin protocol and diabetic diet. No other pharmacologic intervention necessary at this time.  2) hypertension: Blood pressure is currently elevated to 171/80. Patient has not received any antihypertensive medication until the late evening. Will expect appropriate response in blood pressure with resumption of lisinopril. Also will recommend discontinuation of fluids as the patient is  able to maintain by mouth intake. Will expect some increased hypertension secondary to postsurgical pain. Recommend appropriate pain control as tolerated by the patient.  3) osteomyelitis: Currently on vancomycin, status post right great  toe amputation. Surgery likely to be definitive therapy. Primary team will determine course of therapy, guided by tissue culture results.  4) DVT prophylaxis: Consider treatment with Lovenox after appropriate postsurgical interval. Patient will be at increased risk for DVT given his decreased mobility with both BKA and new amputation.    Thank you for this consult. We are happy to provide assistance regarding the care of our clinic patients. If you have further questions please contact us at the below numbers.    1st Contact:  Dr. Margorie John       Pager: [phone] 2nd Contact:  Dr. Rosana Berger        Pager: [phone] After 5 pm or weekends: 1st Contact:      Pager: [phone] 2nd Contact:      Pager: [phone]  Attending physician:  Dr. Margarito Liner   Signed: [facility], Joakim 06/08/2011, 7:09 PM

## 2011-06-08 NOTE — Progress Notes (Signed)
Orthopedic Tech Progress Note Patient Details:  [name] December 06, 1950 [id number]  Other Ortho Devices Type of Ortho Device: Postop boot Ortho Device Location: right foot Ortho Device Interventions: Freeman Caldron, Sarabella Caprio 06/08/2011, 5:06 PM

## 2011-06-08 NOTE — Preoperative (Signed)
Beta Blockers   Reason not to administer Beta Blockers:Not Applicable 

## 2011-06-08 NOTE — Brief Op Note (Signed)
06/08/2011  3:00 PM  PATIENT:  [name]  61 y.o. male  PRE-OPERATIVE DIAGNOSIS:  right hallux osteomyelitis   POST-OPERATIVE DIAGNOSIS:  right hallux osteomyelitis   Procedure(s): Right hallux amputation  SURGEON:  Toni Arthurs, MD  ASSISTANT: n/a  ANESTHESIA:   MAC, regional  EBL:  minimal   TOURNIQUET:  14 min with ankle esmarch  COMPLICATIONS:  None apparent  DISPOSITION:  Extubated, awake and stable to recovery.  DICTATION ID:  [id number]

## 2011-06-08 NOTE — Transfer of Care (Signed)
Immediate Anesthesia Transfer of Care Note  Patient: [name]  Procedure(s) Performed: Procedure(s) (LRB): AMPUTATION RAY (Right)  Patient Location: PACU  Anesthesia Type: MAC combined with regional for post-op pain  Level of Consciousness: awake, alert , oriented and patient cooperative  Airway & Oxygen Therapy: Patient Spontanous Breathing and Patient connected to nasal cannula oxygen  Post-op Assessment: Report given to PACU RN and Post -op Vital signs reviewed and stable  Post vital signs: stable  Complications: No apparent anesthesia complications

## 2011-06-08 NOTE — Anesthesia Preprocedure Evaluation (Addendum)
Anesthesia Evaluation  Patient identified by MRN, date of birth, ID band Patient awake    Reviewed: Allergy & Precautions, H&P , NPO status , Patient's Chart, lab work & pertinent test results  History of Anesthesia Complications Negative for: history of anesthetic complications  Airway Mallampati: I TM Distance: >3 FB Neck ROM: full    Dental  (+) Teeth Intact, Dental Advisory Given and Chipped   Pulmonary COPDformer smoker         Cardiovascular Exercise Tolerance: Poor hypertension, Pt. on medications + Peripheral Vascular Disease Rhythm:regular Rate:Normal     Neuro/Psych Anxiety negative neurological ROS     GI/Hepatic negative GI ROS, Neg liver ROS,   Endo/Other  Diabetes mellitus-, Type 2  Renal/GU negative Renal ROS  negative genitourinary   Musculoskeletal  (+) Arthritis -, Osteoarthritis,    Abdominal (+) + obese,   Peds  Hematology negative hematology ROS (+)   Anesthesia Other Findings   Reproductive/Obstetrics                          Anesthesia Physical Anesthesia Plan  ASA: III  Anesthesia Plan: Regional and MAC   Post-op Pain Management:    Induction: Intravenous  Airway Management Planned: Mask  Additional Equipment:   Intra-op Plan:   Post-operative Plan:   Informed Consent: I have reviewed the patients History and Physical, chart, labs and discussed the procedure including the risks, benefits and alternatives for the proposed anesthesia with the patient or authorized representative who has indicated his/her understanding and acceptance.     Plan Discussed with: Anesthesiologist, Surgeon and CRNA  Anesthesia Plan Comments:         Anesthesia Quick Evaluation

## 2011-06-08 NOTE — Progress Notes (Signed)
ANTIBIOTIC CONSULT NOTE - INITIAL  Pharmacy Consult for Vanco  Indication: right hallux osteomyelitis s/p amputation.  Allergies  Allergen Reactions  . Penicillins Anaphylaxis    Patient Measurements:   Adjusted Body Weight:   Vital Signs: Temp: 97.5 F (36.4 C) (04/04 1600) Temp src: Oral (04/04 1207) BP: 171/81 mmHg (04/04 1600) Pulse Rate: 70  (04/04 1600) Intake/Output from previous day:   Intake/Output from this shift: Total I/O In: 800 [I.V.:700; IV Piggyback:100] Out: -   Labs:  Basename 06/07/11 1547  WBC 16.6*  HGB 14.9  PLT 400  LABCREA --  CREATININE 0.80   The CrCl is unknown because both a height and weight (above a minimum accepted value) are required for this calculation. No results found for this basename: VANCOTROUGH:2,VANCOPEAK:2,VANCORANDOM:2,GENTTROUGH:2,GENTPEAK:2,GENTRANDOM:2,TOBRATROUGH:2,TOBRAPEAK:2,TOBRARND:2,AMIKACINPEAK:2,AMIKACINTROU:2,AMIKACIN:2, in the last 72 hours   Microbiology: Recent Results (from the past 720 hour(s))  SURGICAL PCR SCREEN     Status: Normal   Collection Time   06/07/11  3:29 PM      Component Value Range Status Comment   MRSA, PCR NEGATIVE  NEGATIVE  Final    Staphylococcus aureus NEGATIVE  NEGATIVE  Final     Medical History: Past Medical History  Diagnosis Date  . Hypertension   . Gangrene of foot     Left, s/p KBA  . Anxiety   . Diabetes mellitus     diet controlled    Medications:  Prescriptions prior to admission  Medication Sig Dispense Refill  . aspirin EC 81 MG tablet Take 81 mg by mouth daily.      . clindamycin (CLEOCIN) 300 MG capsule Take 300 mg by mouth 4 (four) times daily. Starting 05/28/11 for 10 days      . fish oil-omega-3 fatty acids 1000 MG capsule Take 1 g by mouth daily.      . fluticasone (FLONASE) 50 MCG/ACT nasal spray Place 1 spray into the nose daily. Spray one spray in each nostril every day. Please dispense a 3 month supply      . lisinopril (PRINIVIL,ZESTRIL) 30 MG  tablet Take 1 tablet (30 mg total) by mouth daily.  90 tablet  1  . glucose blood (ONE TOUCH ULTRA TEST) test strip Use as instructed  100 each  12  . Lancets (ONETOUCH ULTRASOFT) lancets Use as instructed  100 each  12  . sildenafil (VIAGRA) 100 MG tablet Take 1 tablet (100 mg total) by mouth daily as needed. Take one tablet 30-60 min before activity.  10 tablet  5   Assessment: right hallux osteomyelitis s/p amputation.  PMH: HTN, h/o Left foot gangrene s/p BKA, anxiety, DM, +tobacco,   CrCl estimated > 100  Goal of Therapy:  Vancomycin trough level 15-20 mcg/ml  Plan:  Vanco 1g IV in OR at 1449. Vanco 1g IV q8hr. Trough after 3-5 doses.  Merilynn Finland, [employer] 06/08/2011,4:50 PM

## 2011-06-08 NOTE — H&P (Signed)
[name] is an 61 y.o. male.   Chief Complaint: right hallux osteomyelitis HPI: 60 y/o male with right hallux osteomyelitis presents now for right hallux amputation.  Past Medical History  Diagnosis Date  . Hypertension   . Gangrene of foot     Left, s/p KBA  . Anxiety   . Diabetes mellitus     diet controlled    Past Surgical History  Procedure Date  . Amputation     rt ak    Family History  Problem Relation Age of Onset  . Prostate cancer Father    Social History:  reports that he quit smoking 8 days ago. His smoking use included Cigarettes. He has a 33 pack-year smoking history. He does not have any smokeless tobacco history on file. He reports that he drinks alcohol. He reports that he does not use illicit drugs.  Allergies:  Allergies  Allergen Reactions  . Penicillins Anaphylaxis    Medications Prior to Admission  Medication Dose Route Frequency Provider Last Rate Last Dose  . 0.9 %  sodium chloride infusion   Intravenous Continuous Toni Arthurs, MD      . chlorhexidine (HIBICLENS) 4 % liquid 4 application  60 mL Topical Once Toni Arthurs, MD      . fentaNYL (SUBLIMAZE) injection 50-100 mcg  50-100 mcg Intravenous PRN Rivka Barbara, MD   100 mcg at 06/08/11 1340  . gadobenate dimeglumine (MULTIHANCE) injection 20 mL  20 mL Intravenous Once Medication Radiologist, MD   20 mL at 05/30/11 1330  . lactated ringers infusion   Intravenous Continuous Rivka Barbara, MD 20 mL/hr at 06/08/11 1301     Medications Prior to Admission  Medication Sig Dispense Refill  . aspirin EC 81 MG tablet Take 81 mg by mouth daily.      . clindamycin (CLEOCIN) 300 MG capsule Take 300 mg by mouth 4 (four) times daily. Starting 05/28/11 for 10 days      . fish oil-omega-3 fatty acids 1000 MG capsule Take 1 g by mouth daily.      . fluticasone (FLONASE) 50 MCG/ACT nasal spray Place 1 spray into the nose daily. Spray one spray in each nostril every day. Please dispense a 3  month supply      . lisinopril (PRINIVIL,ZESTRIL) 30 MG tablet Take 1 tablet (30 mg total) by mouth daily.  90 tablet  1  . glucose blood (ONE TOUCH ULTRA TEST) test strip Use as instructed  100 each  12  . Lancets (ONETOUCH ULTRASOFT) lancets Use as instructed  100 each  12  . sildenafil (VIAGRA) 100 MG tablet Take 1 tablet (100 mg total) by mouth daily as needed. Take one tablet 30-60 min before activity.  10 tablet  5    Results for orders placed during the hospital encounter of 06/08/11 (from the past 48 hour(s))  GLUCOSE, CAPILLARY     Status: Abnormal   Collection Time   06/08/11 12:11 PM      Component Value Range Comment   Glucose-Capillary 150 (*) 70 - 99 (mg/dL)    Dg Chest 2 View  03/11/1094  *RADIOLOGY REPORT*  Clinical Data: Preoperative examination (right hallux amputation); History of smoking  CHEST - 2 VIEW  Comparison: 01/12/2009; CT abdomen pelvis - 09/28/2009  Findings:  Grossly unchanged enlarged cardiac silhouette and mediastinal contours.  Improved inspiratory effort with interval decrease in bibasilar heterogeneous opacities.  Persistent mild diffuse interstitial thickening within the bilateral mid lungs, right greater than  left.  No new focal airspace opacities.  No pleural effusion or pneumothorax.  No acute osseous abnormalities.  IMPRESSION: Improved inspiratory effort with persistent bibasilar opacities and interstitial markings likely due to the atelectasis/scar and chronic bronchitic change.  No acute cardiopulmonary disease.  Original Report Authenticated By: [name], M.D.    ROS  No recent f/c/n/v/wt loss.  Blood pressure 160/85, pulse 67, temperature 98.1 F (36.7 C), temperature source Oral, resp. rate 16, SpO2 100.00%. Physical Exam WN WD male in nad.  A and O x 4.  Mood and affect normal.  EOMI.  Resp unlabored.  R hallux with healthy skin.  No cellulitis.  No drainage.  Decreased sens to LT at toes.  Palpable pulses at [facility] and PT.  Assessment/Plan R  hallux osteomyelitis - to OR for amputation of the right hallux distal phalanx.  The risks and benefits of the alternative treatment options have been discussed in detail.  The patient wishes to proceed with surgery and specifically understands risks of bleeding, infection, nerve damage, blood clots, need for additional surgery, amputation and death.   Toni Arthurs Jun 23, 2011, 2:03 PM

## 2011-06-08 NOTE — Anesthesia Procedure Notes (Addendum)
Anesthesia Regional Block:  Ankle block  Pre-Anesthetic Checklist: ,, timeout performed, Correct Patient, Correct Site, Correct Laterality, Correct Procedure, Correct Position, site marked, Risks and benefits discussed,  Surgical consent,  Pre-op evaluation,  At surgeon's request and post-op pain management  Laterality: Right  Prep: Maximum Sterile Barrier Precautions used and chloraprep       Needles:  Injection technique: Single-shot      Additional Needles: Ankle block Narrative:  Start time: 06/08/2011 1:35 PM End time: 06/08/2011 1:45 PM  Performed by: Personally  Anesthesiologist: Maren Beach MD  Additional Notes: Ant/ Post  Tibial     Procedure Name: MAC Date/Time: 06/08/2011 2:15 PM Performed by: Leona Singleton A Pre-anesthesia Checklist: Patient identified Patient Re-evaluated:Patient Re-evaluated prior to inductionOxygen Delivery Method: Simple face mask Placement Confirmation: positive ETCO2 and breath sounds checked- equal and bilateral Dental Injury: Teeth and Oropharynx as per pre-operative assessment

## 2011-06-08 NOTE — Anesthesia Postprocedure Evaluation (Signed)
  Anesthesia Post-op Note  Patient: [name]  Procedure(s) Performed: Procedure(s) (LRB): AMPUTATION RAY (Right)  Patient Location: PACU  Anesthesia Type: MAC combined with regional for post-op pain  Level of Consciousness: awake, alert , oriented and patient cooperative  Airway and Oxygen Therapy: Patient Spontanous Breathing and Patient connected to nasal cannula oxygen  Post-op Pain: none  Post-op Assessment: Post-op Vital signs reviewed, Patient's Cardiovascular Status Stable, Respiratory Function Stable, Patent Airway, No signs of Nausea or vomiting and Pain level controlled  Post-op Vital Signs: stable  Complications: No apparent anesthesia complications

## 2011-06-08 NOTE — Progress Notes (Signed)
Dr. Katrinka Blazing notified of EKGs. No orders received.

## 2011-06-09 ENCOUNTER — Encounter (HOSPITAL_COMMUNITY): Payer: Self-pay | Admitting: Orthopedic Surgery

## 2011-06-09 DIAGNOSIS — M869 Osteomyelitis, unspecified: Secondary | ICD-10-CM

## 2011-06-09 LAB — CBC
HCT: 42.2 % (ref 39.0–52.0)
Hemoglobin: 13.8 g/dL (ref 13.0–17.0)
MCH: 27.2 pg (ref 26.0–34.0)
MCHC: 32.7 g/dL (ref 30.0–36.0)
MCV: 83.2 fL (ref 78.0–100.0)
Platelets: 317 10*3/uL (ref 150–400)
RBC: 5.07 MIL/uL (ref 4.22–5.81)
RDW: 13.1 % (ref 11.5–15.5)
WBC: 17 10*3/uL — ABNORMAL HIGH (ref 4.0–10.5)

## 2011-06-09 LAB — BASIC METABOLIC PANEL
BUN: 19 mg/dL (ref 6–23)
CO2: 28 mEq/L (ref 19–32)
Calcium: 9 mg/dL (ref 8.4–10.5)
Chloride: 103 mEq/L (ref 96–112)
Creatinine, Ser: 0.93 mg/dL (ref 0.50–1.35)
GFR calc Af Amer: >90 mL/min (ref >90)
GFR calc non Af Amer: >90 mL/min (ref >90)
Glucose, Bld: 152 mg/dL — ABNORMAL HIGH (ref 70–99)
Potassium: 4.2 mEq/L (ref 3.5–5.1)
Sodium: 138 mEq/L (ref 135–145)

## 2011-06-09 LAB — GLUCOSE, CAPILLARY
Glucose-Capillary: 157 mg/dL — ABNORMAL HIGH (ref 70–99)
Glucose-Capillary: 181 mg/dL — ABNORMAL HIGH (ref 70–99)
Glucose-Capillary: 146 mg/dL — ABNORMAL HIGH (ref 70–99)
Glucose-Capillary: 130 mg/dL — ABNORMAL HIGH (ref 70–99)

## 2011-06-09 LAB — SEDIMENTATION RATE: Sed Rate: 11 mm/hr (ref 0–16)

## 2011-06-09 LAB — C-REACTIVE PROTEIN: CRP: 0.28 mg/dL — ABNORMAL LOW (ref <0.60)

## 2011-06-09 NOTE — Progress Notes (Signed)
Subjective: 1 Day Post-Op Procedure(s) (LRB): AMPUTATION RAY (Right) Patient reports pain as mild.    Objective: Vital signs in last 24 hours: Temp:  [97.5 F (36.4 C)-98.4 F (36.9 C)] 98.4 F (36.9 C) (04/05 1316) Pulse Rate:  [63-77] 64  (04/05 1316) Resp:  [8-21] 18  (04/05 1316) BP: (128-171)/(65-81) 168/72 mmHg (04/05 1316) SpO2:  [98 %-100 %] 98 % (04/05 1316) Weight:  [95.2 kg (209 lb 14.1 oz)] 95.2 kg (209 lb 14.1 oz) (04/04 1620)  Intake/Output from previous day: 04/04 0701 - 04/05 0700 In: 1560 [P.O.:360; I.V.:700; IV Piggyback:500] Out: 400 [Urine:400] Intake/Output this shift: Total I/O In: -  Out: 600 [Urine:600]   Basename 06/09/11 0600 06/07/11 1547  HGB 13.8 14.9    Basename 06/09/11 0600 06/07/11 1547  WBC 17.0* 16.6*  RBC 5.07 5.34  HCT 42.2 43.7  PLT 317 400    Basename 06/09/11 0600 06/07/11 1547  NA 138 137  K 4.2 4.1  CL 103 101  CO2 28 27  BUN 19 19  CREATININE 0.93 0.80  GLUCOSE 152* 132*  CALCIUM 9.0 9.2   No results found for this basename: LABPT:2,INR:2 in the last 72 hours  wound dressed and dry.  Assessment/Plan: 1 Day Post-Op Procedure(s) (LRB): AMPUTATION RAY (Right) Cultures neg to date.  cONTINUE IV abx per ID.  Will await recs regarding PICC line placement. OOB to ambulate.  WBAT on R LE with hard sole shoe.  Toni Arthurs 06/09/2011, 2:10 PM

## 2011-06-09 NOTE — Op Note (Signed)
[name], [name]              ACCOUNT NO.:  [id number]  MEDICAL RECORD NO.:  [id number]  LOCATION:  5008                         FACILITY:  [facility]  PHYSICIAN:  Toni Arthurs, MD        DATE OF BIRTH:  [birth year]  DATE OF PROCEDURE:  06/08/2011 DATE OF DISCHARGE:                              OPERATIVE REPORT   PREOPERATIVE DIAGNOSIS:  Right hallux osteomyelitis.  POSTOPERATIVE DIAGNOSIS:  Right hallux osteomyelitis.  PROCEDURE:  Right hallux amputation.  SURGEON:  Toni Arthurs, MD  ANESTHESIA:  MAC, regional.  ESTIMATED BLOOD LOSS:  Minimal.  TOURNIQUET TIME:  14 minutes with an ankle Esmarch.  COMPLICATIONS:  None apparent.  DISPOSITION:  Extubated, awake, and stable to recovery.  SPECIMENS:  Deep tissue from the distal phalanx to microbiology for aerobic and anaerobic cultures, right hallux to pathology.  INDICATIONS FOR PROCEDURE:  The patient is a 61 year old male with past medical history significant for smoking and type 2 diabetes.  He has had purulent drainage from beneath the toenail of his right hallux for several weeks.  He has been treated with clindamycin.  An MRI was obtained showing osteomyelitis of the tip of the right hallux.  He presents now for amputation of the distal phalanx.  He understands the risks and benefits of the alternative treatment options and elects surgical treatment.  Specifically, he understands risks of bleeding, infection, nerve damage, blood clots, need for additional surgery, revision amputation, and death.  PROCEDURE IN DETAIL:  After preoperative consent was obtained and the correct operative site was identified, the patient was brought to the operating room and placed supine on the operating table.  Regional anesthesia had previously been administered, IV sedation was administered.  Surgical time-out was taken.  Preoperative antibiotics were held until active cultures were obtained.  The right lower extremity was prepped and  draped in standard sterile fashion.  The foot was exsanguinated and a 4-inch Esmarch tourniquet was wrapped around the ankle.  A racket-style incision was marked on the skin at the base of the distal phalanx.  This incision was made.  Sharp dissection was carried down through the skin of the subcutaneous tissue at the level of the bone.  The toe was disarticulated at the IP joint.  The head of the proximal phalanx was exposed with subperiosteal dissection to the level of the shaft.  An oscillating saw was used to cut through the proximal phalanx, removing the head in its entirety.  The subcutaneous tissue was then debrided and irrigated copiously.  Vertical mattress and simple sutures of 2-0 nylon were used to close the skin incision.  Sterile dressings were applied followed by a compression wrap.  Tourniquet was released at approximately 14 minutes after application of the dressings.  Attention was then turned to the back table where the hallux was examined.  At the site of the ulcer, a rongeur was used to collect deep tissue and bone from the distal phalanx.  This was sent as a specimen to microbiology for aerobic and anaerobic culture.  The toe was then sent as a specimen to pathology.  The patient was then awakened by Anesthesia and transported to recovery room in stable  condition.  FOLLOWUP PLAN:  The patient will be admitted to the hospital and maintained on IV vancomycin.  Infectious Disease consultation will be obtained.     Toni Arthurs, MD     JH/MEDQ  D:  06/08/2011  T:  06/09/2011  Job:  [id number]

## 2011-06-09 NOTE — Consult Note (Signed)
Infectious Diseases Initial Consultation  Reason for Consultation:  Acute osteomyelitis of right hallux s/p amputation   HPI: [name] is a 61 y.o. male  With diabetes, HTN, s/p left BKA presented to [facility] on 05/29/2011 with  3-4 day history of draining right great toe lesion. He first noticed some swelling, pain, and redness in his toe. This began after a few days of hard work and increased time spent on his feet from working on his truck as well as cutting firewood. He denies any injury to his foot as the inciting event. He noted the area becoming progressively red with a tiny area of whitish-blue discoloration at the tip of his great toe. He has known history of onychomycosis and started to have drainage starting underneath his nailbed. He reports soaking his feet in salt water and reports spontaneous drainage of purulent material roughly 2-3 tsp of drainage. He was started on clindamycin on 3/25 via urgent care physician for which he has been taking up until the day prior to surgery.  He continues to experience intermittent drainage of purulent material. HE is unclear if antibiotics had improved his symptoms. He denies fever, chills, nausea, vomiting, diarrhea, or other associated symptom. He had MRI which showed evidence of osteomyelitis to distal hallux and elevated inflammatory markers of  ESR 53, and CRP 6.29 on 3/26.  His PCP, Catheryn Bacon, has arranged for the patient to be evaluated by orthopedics who felt that amputation of affected area would be beneficial. Patient was admitted on 4/4 for surgery. On admit, he was found to have leukocytosis of 17K. He is pod #1 s/p right hallux amputation.   Past Medical History  Diagnosis Date  . Hypertension   . Gangrene of foot     Left, s/p KBA  . Anxiety   . Osteomyelitis of toe 06/08/11    right foot  . Seasonal allergies   . Type II diabetes mellitus     diet controlled  . Arthritis   . HOH (hard of hearing)      Allergies:  Allergies  Allergen Reactions  . Penicillins Anaphylaxis    Current antibiotics: Vancomycin Day #2  MEDICATIONS:    . aspirin EC  81 mg Oral Daily  . docusate sodium  100 mg Oral BID  . fluticasone  1 spray Each Nare Daily  . insulin aspart  0-15 Units Subcutaneous TID WC  . lisinopril  30 mg Oral Daily  . omega-3 acid ethyl esters  1 g Oral Daily  . senna  1 tablet Oral BID  . vancomycin  1,000 mg Intravenous Q8H  . DISCONTD: chlorhexidine  60 mL Topical Once  . DISCONTD: fish oil-omega-3 fatty acids  1 g Oral Daily    History  Substance Use Topics  . Smoking status: Former Smoker -- 1.5 packs/day for 22 years    Types: Cigarettes    Quit date: 05/31/2011  . Smokeless tobacco: Former Neurosurgeon    Types: Chew    Quit date: 03/07/1983  . Alcohol Use: Yes     06/08/11 "no liquor for 2 1/2 years; last beer 05/31/11"    Family History  Problem Relation Age of Onset  . Prostate cancer Father      Review of Systems  Constitutional: Negative for fever, chills, diaphoresis, activity change, appetite change, fatigue and unexpected weight change.  HENT: Negative for congestion, sore throat, rhinorrhea, sneezing, trouble swallowing and sinus pressure.  Eyes: Negative for photophobia and visual disturbance.  Respiratory:  Negative for cough, chest tightness, shortness of breath, wheezing and stridor.  Cardiovascular: Negative for chest pain, palpitations and leg swelling.  Gastrointestinal: Negative for nausea, vomiting, abdominal pain, diarrhea, constipation, blood in stool, abdominal distention and anal bleeding.  Genitourinary: Negative for dysuria, hematuria, flank pain and difficulty urinating.  Musculoskeletal: Negative for myalgias, back pain, joint swelling, arthralgias and gait problem.  Skin: positive for wound as stated in HPI Neurological: Negative for dizziness, tremors, weakness and light-headedness.  Hematological: Negative for adenopathy. Does not  bruise/bleed easily.  Psychiatric/Behavioral: Negative for behavioral problems, confusion, sleep disturbance, dysphoric mood, decreased concentration and agitation.    OBJECTIVE:  BP 154/67  Pulse 63  Temp(Src) 97.6 F (36.4 C) (Oral)  Resp 18  Ht 6' 0.84" (1.85 m)  Wt 209 lb 14.1 oz (95.2 kg)  BMI 27.82 kg/m2  SpO2 98%  General Appearance:    Alert, cooperative, no distress, appears stated age  Head:    Normocephalic, without obvious abnormality, atraumatic  Eyes:    PERRL, conjunctiva/corneas clear, EOM's intact,        Nose:   Nares normal, septum midline, mucosa normal, no drainage   or sinus tenderness  Throat:   Lips, mucosa, and tongue normal; teeth and gums normal  Neck:   Supple, symmetrical, trachea midline, no adenopathy;         Back:     Symmetric, no curvature, ROM normal, no CVA tenderness  Lungs:     Clear to auscultation bilaterally, respirations unlabored     Heart:    Regular rate and rhythm, S1 and S2 normal, no murmur, rub   or gallop  Abdomen:     Soft, non-tender, bowel sounds active all four quadrants,    no masses, no organomegaly        Extremities:   Left BKA, and right foot is wrapped from surgery  Pulses:   2+ and symmetric all extremities  Skin:   Skin color, texture, turgor normal, no rashes or lesions  Lymph nodes:   Cervical, supraclavicular, and axillary nodes normal  Neurologic:   CNII-XII intact. Normal strength, sensation and reflexes      throughout   LABS: Results for orders placed during the hospital encounter of 06/08/11 (from the past 48 hour(s))  GLUCOSE, CAPILLARY     Status: Abnormal   Collection Time   06/08/11 12:11 PM      Component Value Range Comment   Glucose-Capillary 150 (*) 70 - 99 (mg/dL)   GLUCOSE, CAPILLARY     Status: Abnormal   Collection Time   06/08/11  3:06 PM      Component Value Range Comment   Glucose-Capillary 134 (*) 70 - 99 (mg/dL)    Comment 1 Documented in Chart      Comment 2 Notify RN     TISSUE  CULTURE     Status: Normal (Preliminary result)   Collection Time   06/08/11  3:41 PM      Component Value Range Comment   Specimen Description TISSUE FOOT RIGHT      Special Requests NONE      Gram Stain PENDING      Culture NO GROWTH 1 DAY      Report Status PENDING     ANAEROBIC CULTURE     Status: Normal (Preliminary result)   Collection Time   06/08/11  3:41 PM      Component Value Range Comment   Specimen Description TISSUE FOOT RIGHT  Special Requests NONE      Gram Stain        Value: NO WBC SEEN     NO SQUAMOUS EPITHELIAL CELLS SEEN     NO ORGANISMS SEEN   Culture PENDING      Report Status PENDING     GLUCOSE, CAPILLARY     Status: Abnormal   Collection Time   06/08/11  4:08 PM      Component Value Range Comment   Glucose-Capillary 146 (*) 70 - 99 (mg/dL)    Comment 1 Documented in Chart      Comment 2 Notify RN     GLUCOSE, CAPILLARY     Status: Abnormal   Collection Time   06/08/11 10:02 PM      Component Value Range Comment   Glucose-Capillary 120 (*) 70 - 99 (mg/dL)   CBC     Status: Abnormal   Collection Time   06/09/11  6:00 AM      Component Value Range Comment   WBC 17.0 (*) 4.0 - 10.5 (K/uL)    RBC 5.07  4.22 - 5.81 (MIL/uL)    Hemoglobin 13.8  13.0 - 17.0 (g/dL)    HCT 57.8  46.9 - 62.9 (%)    MCV 83.2  78.0 - 100.0 (fL)    MCH 27.2  26.0 - 34.0 (pg)    MCHC 32.7  30.0 - 36.0 (g/dL)    RDW 52.8  41.3 - 24.4 (%)    Platelets 317  150 - 400 (K/uL)   BASIC METABOLIC PANEL     Status: Abnormal   Collection Time   06/09/11  6:00 AM      Component Value Range Comment   Sodium 138  135 - 145 (mEq/L)    Potassium 4.2  3.5 - 5.1 (mEq/L)    Chloride 103  96 - 112 (mEq/L)    CO2 28  19 - 32 (mEq/L)    Glucose, Bld 152 (*) 70 - 99 (mg/dL)    BUN 19  6 - 23 (mg/dL)    Creatinine, Ser 0.10  0.50 - 1.35 (mg/dL)    Calcium 9.0  8.4 - 10.5 (mg/dL)    GFR calc non Af Amer >90  >90 (mL/min)    GFR calc Af Amer >90  >90 (mL/min)   GLUCOSE, CAPILLARY     Status:  Abnormal   Collection Time   06/09/11  6:17 AM      Component Value Range Comment   Glucose-Capillary 157 (*) 70 - 99 (mg/dL)     MICRO: 4/4 tissue cx = PENDING  IMAGING: Dg Chest 2 View  06/07/2011  *RADIOLOGY REPORT*  Clinical Data: Preoperative examination (right hallux amputation); History of smoking  CHEST - 2 VIEW  Comparison: 01/12/2009; CT abdomen pelvis - 09/28/2009  Findings:  Grossly unchanged enlarged cardiac silhouette and mediastinal contours.  Improved inspiratory effort with interval decrease in bibasilar heterogeneous opacities.  Persistent mild diffuse interstitial thickening within the bilateral mid lungs, right greater than left.  No new focal airspace opacities.  No pleural effusion or pneumothorax.  No acute osseous abnormalities.  IMPRESSION: Improved inspiratory effort with persistent bibasilar opacities and interstitial markings likely due to the atelectasis/scar and chronic bronchitic change.  No acute cardiopulmonary disease.  Original Report Authenticated By: [name], M.D.    HISTORICAL lab/IMAGING 05/30/2011 FOOT MRI: Dorsal medial terminal phalanx great toe osteomyelitis with  cortical erosion and edema and enhancement of the marrow space. No  soft  tissue abscess. Infectious or reactive fluid along the  superficial aspect of the terminal phalanx.  2. Edema and atrophy of the foot musculature compatible with  diabetic neuropathy.   3/26 ESR 53,  3/26 CRP 6.29  Assessment/Plan:  61yo Male with DM and acute osteomyelitis of right hallux POD#1 s/p great toe amputation currently on vancomycin. Prior to surgery patient had been taking clindamycin  - continue on vancomycin for now - please check CBC with diff, sed rate, crp tomorrow.  - will follow up cultures to see if need to change antibiotic therapy  - will do 10 day of "mop-up" with bactrim or doxycycline if cultures remain negative.  Dr. Enedina Finner to provide final abtx rec over the weekend once  culture results finalize. Thank you for the consultation.  Duke Salvia Drue Second MD M[facility] [phone]

## 2011-06-09 NOTE — Consult Note (Signed)
Internal Medicine Attending Consult Note Date: 06/09/2011  Patient name: [name] Medical record number: [id number] Date of birth: 11/29/1950 Age: 61 y.o. Gender: male  I saw and evaluated the patient. I reviewed the resident's note and I agree with the resident's findings and plan as documented in the resident's note, with additional comments as noted below.  Chief Complaint(s): Status post right hallux amputation for right hallux osteomyelitis.  History - key components related to admission: Patient is a 61 year old man with a history of diabetes mellitus, hypertension, status post left BKA in 2010, who developed pain, swelling, redness, and purulent drainage of his right great toe in March of this year and was subsequently found to have osteomyelitis on MRI.  He was admitted for right hallux amputation, and we were consulted for advice on management of diabetes and hypertension.  Other than his toe, patient has no acute complaints.  Physical Exam - key components related to admission:  Filed Vitals:   06/08/11 1620 06/08/11 2028 06/09/11 0315 06/09/11 0605  BP:  158/75 168/78 154/67  Pulse:  68 65 63  Temp:  98.3 F (36.8 C) 98.1 F (36.7 C) 97.6 F (36.4 C)  TempSrc:  Oral Oral Oral  Resp:  16 16 18   Height: 6' 0.83" (1.85 m)     Weight: 209 lb 14.1 oz (95.2 kg)     SpO2:  99% 99% 98%   General: Alert, no distress Lungs: Clear Heart: Regular; no extra sounds or murmurs Abdomen: Bowel sounds present, soft, nontender Extremities: Status post left BKA; no right lower extremity edema; dressing is dry and intact   Lab results:   Basic Metabolic Panel:  Basename 06/09/11 0600 06/07/11 1547  NA 138 137  K 4.2 4.1  CL 103 101  CO2 28 27  GLUCOSE 152* 132*  BUN 19 19  CREATININE 0.93 0.80  CALCIUM 9.0 9.2  MG -- --  PHOS -- --    CBC:  Basename 06/09/11 0600 06/07/11 1547  WBC 17.0* 16.6*  NEUTROABS -- --  HGB 13.8 14.9  HCT 42.2 43.7  MCV 83.2 81.8  PLT  317 400    CBG:  Basename 06/09/11 1103 06/09/11 0617 06/08/11 2202 06/08/11 1608 06/08/11 1506 06/08/11 1211  GLUCAP 181* 157* 120* 146* 134* 150*   Hemoglobin A1C  Date Value Range Status  05/05/2011 6.8   Final  02/17/2011 7.0   Final  11/04/2010 6.2   Final     Imaging results:  Dg Chest 2 View  06/07/2011  *RADIOLOGY REPORT*  Clinical Data: Preoperative examination (right hallux amputation); History of smoking  CHEST - 2 VIEW  Comparison: 01/12/2009; CT abdomen pelvis - 09/28/2009  Findings:  Grossly unchanged enlarged cardiac silhouette and mediastinal contours.  Improved inspiratory effort with interval decrease in bibasilar heterogeneous opacities.  Persistent mild diffuse interstitial thickening within the bilateral mid lungs, right greater than left.  No new focal airspace opacities.  No pleural effusion or pneumothorax.  No acute osseous abnormalities.  IMPRESSION: Improved inspiratory effort with persistent bibasilar opacities and interstitial markings likely due to the atelectasis/scar and chronic bronchitic change.  No acute cardiopulmonary disease.  Original Report Authenticated By: [name], M.D.     Assessment & Plan by Problem:  1.  Diabetes mellitus.  Patient was formerly on metformin, but has been on dietary management alone for several months with a recent hemoglobin A1c of 6.8.  This is below the target of 7.  We will discuss with  patient's PCP Dr. Arvilla Market the option of resuming a low dose of metformin given this recent infection.  Agree with sliding scale insulin in the acute setting.  2.  Hypertension.  Plan is continue home regimen, follow blood pressure, and adjust as indicated.  3. Osteomyelitis of right great toe, status post right great toe amputation.  Infectious disease consult will see patient to advise on antibiotic treatment.

## 2011-06-09 NOTE — Progress Notes (Addendum)
Subjective: Patient reports feeling fine, denies any chest pain, SOB.  Slightly irritated because he does not like to be in the hospital.  Objective: Vital signs in last 24 hours: Filed Vitals:   06/08/11 1620 06/08/11 2028 06/09/11 0315 06/09/11 0605  BP:  158/75 168/78 154/67  Pulse:  68 65 63  Temp:  98.3 F (36.8 C) 98.1 F (36.7 C) 97.6 F (36.4 C)  TempSrc:  Oral Oral Oral  Resp:  16 16 18   Height: 6' 0.83" (1.85 m)     Weight: 209 lb 14.1 oz (95.2 kg)     SpO2:  99% 99% 98%   Weight change:   Intake/Output Summary (Last 24 hours) at 06/09/11 1156 Last data filed at 06/09/11 1039  Gross per 24 hour  Intake   1560 ml  Output   1000 ml  Net    560 ml   Physical Exam: General: alert, well-developed, and cooperative to examination.   Lungs: normal respiratory effort, no accessory muscle use, normal breath sounds, no crackles, and no wheezes. Heart: normal rate, regular rhythm, no murmur, no gallop, and no rub.  Abdomen: soft, non-tender, normal bowel sounds, no distention, no guarding, no rebound tenderness  Extremities: s/p left BKA with clean stump, s/p right great toe amputation  Neurologic: alert & oriented X3, nonfocal, moving all 4 extremities Psych: Oriented X3, memory intact for recent and remote, normally interactive, good eye contact, not anxious appearing, and not depressed appearing.  Lab Results: Basic Metabolic Panel:  Lab 06/09/11 2130 06/07/11 1547  NA 138 137  K 4.2 4.1  CL 103 101  CO2 28 27  GLUCOSE 152* 132*  BUN 19 19  CREATININE 0.93 0.80  CALCIUM 9.0 9.2  MG -- --  PHOS -- --   CBC:  Lab 06/09/11 0600 06/07/11 1547  WBC 17.0* 16.6*  NEUTROABS -- --  HGB 13.8 14.9  HCT 42.2 43.7  MCV 83.2 81.8  PLT 317 400   CBG:  Lab 06/09/11 1103 06/09/11 0617 06/08/11 2202 06/08/11 1608 06/08/11 1506 06/08/11 1211  GLUCAP 181* 157* 120* 146* 134* 150*   Urine Drug Screen: Drugs of Abuse     Component Value Date/Time   LABOPIA NONE  DETECTED 01/13/2009 0330   COCAINSCRNUR NONE DETECTED 01/13/2009 0330   LABBENZ NONE DETECTED 01/13/2009 0330   AMPHETMU NONE DETECTED 01/13/2009 0330   THCU NONE DETECTED 01/13/2009 0330   LABBARB  Value: NONE DETECTED        DRUG SCREEN FOR MEDICAL PURPOSES ONLY.  IF CONFIRMATION IS NEEDED FOR ANY PURPOSE, NOTIFY LAB WITHIN 5 DAYS.        LOWEST DETECTABLE LIMITS FOR URINE DRUG SCREEN Drug Class       Cutoff (ng/mL) Amphetamine      1000 Barbiturate      200 Benzodiazepine   200 Tricyclics       300 Opiates          300 Cocaine          300 THC              50 01/13/2009 0330      Micro Results: Recent Results (from the past 240 hour(s))  SURGICAL PCR SCREEN     Status: Normal   Collection Time   06/07/11  3:29 PM      Component Value Range Status Comment   MRSA, PCR NEGATIVE  NEGATIVE  Final    Staphylococcus aureus NEGATIVE  NEGATIVE  Final  TISSUE CULTURE     Status: Normal (Preliminary result)   Collection Time   06/08/11  3:41 PM      Component Value Range Status Comment   Specimen Description TISSUE FOOT RIGHT   Final    Special Requests NONE   Final    Gram Stain PENDING   Incomplete    Culture NO GROWTH 1 DAY   Final    Report Status PENDING   Incomplete   ANAEROBIC CULTURE     Status: Normal (Preliminary result)   Collection Time   06/08/11  3:41 PM      Component Value Range Status Comment   Specimen Description TISSUE FOOT RIGHT   Final    Special Requests NONE   Final    Gram Stain     Final    Value: NO WBC SEEN     NO SQUAMOUS EPITHELIAL CELLS SEEN     NO ORGANISMS SEEN   Culture     Final    Value: NO ANAEROBES ISOLATED; CULTURE IN PROGRESS FOR 5 DAYS   Report Status PENDING   Incomplete    Studies/Results: Dg Chest 2 View  06/07/2011  *RADIOLOGY REPORT*  Clinical Data: Preoperative examination (right hallux amputation); History of smoking  CHEST - 2 VIEW  Comparison: 01/12/2009; CT abdomen pelvis - 09/28/2009  Findings:  Grossly unchanged enlarged cardiac  silhouette and mediastinal contours.  Improved inspiratory effort with interval decrease in bibasilar heterogeneous opacities.  Persistent mild diffuse interstitial thickening within the bilateral mid lungs, right greater than left.  No new focal airspace opacities.  No pleural effusion or pneumothorax.  No acute osseous abnormalities.  IMPRESSION: Improved inspiratory effort with persistent bibasilar opacities and interstitial markings likely due to the atelectasis/scar and chronic bronchitic change.  No acute cardiopulmonary disease.  Original Report Authenticated By: [name], M.D.   Medications:  Scheduled Meds:   . aspirin EC  81 mg Oral Daily  . docusate sodium  100 mg Oral BID  . fluticasone  1 spray Each Nare Daily  . insulin aspart  0-15 Units Subcutaneous TID WC  . lisinopril  30 mg Oral Daily  . omega-3 acid ethyl esters  1 g Oral Daily  . senna  1 tablet Oral BID  . vancomycin  1,000 mg Intravenous Q8H  . DISCONTD: chlorhexidine  60 mL Topical Once  . DISCONTD: fish oil-omega-3 fatty acids  1 g Oral Daily   Continuous Infusions:   . DISCONTD: sodium chloride    . DISCONTD: sodium chloride 75 mL/hr at 06/08/11 1722  . DISCONTD: lactated ringers 20 mL/hr at 06/08/11 1301   PRN Meds:.HYDROcodone-acetaminophen, metoCLOPramide (REGLAN) injection, metoCLOPramide, ondansetron (ZOFRAN) IV, ondansetron, DISCONTD: 0.9 % irrigation (POUR BTL), DISCONTD: bacitracin, DISCONTD: fentaNYL, DISCONTD: HYDROmorphone, DISCONTD: HYDROmorphone, DISCONTD: ondansetron (ZOFRAN) IV, DISCONTD: ondansetron (ZOFRAN) IV Assessment/Plan: 1. Right great toe osteomyelitis s/p amputation-stable, being managed by surgery team.  Currently on Vancomycin.  Wound culture prelim shows no growth.  ID consult is pending for assistance with abx.  2. HTN: SBP 150-170's.  Not adequately controlled, which is likely secondary to post-op pain. Patient did not receive his Lisinopril until late last night.  Will continue  current regimen at this time and if it continues to be elevated, will increase Lisinopril to 40mg  qd in AM.    3. DM II: HbA1c on 05/05/11 was 6.8 and he was on Metformin in the past without any adverse effects.  Will speak with his PCP, Dr. Arvilla Market regarding  resuming low dose Metformin given that he has infections of the lower extremities as a complication of his DM.  CBGs 120-180's.   -Will continue SSI-moderate scale for now   LOS: 1 day   Thailyn Khalid 06/09/2011, 11:56 AM  I discussed with Dr. Arvilla Market and she prefers that we hold Metformin at this time.  Dr. Arvilla Market will also stop by to see patient later today.

## 2011-06-09 NOTE — Progress Notes (Addendum)
Pt is s/p R great toe amputation due to osteomyelitis. Pt is old L BKA in the past. Pt has a dry ace coban dressing to RLE. Pt has RLE elevated on pillows to prevent/minimize edema. Pt has repts a history of "decreased sensation of RLE". Pt denies a change in sensation of R toes. Pt has + movement and + cap refill of R toes. R toes are warm to touch. Pt repts that he has a prosthesis at home that he utilizes for ambulation. Pt repts that he was told not "to put any weight" on his R foot only on the heel. Pt has a postop shoe a bedside for when up. Pt neuro check is negative except pt is HOH. Pt is alert and oriented x3. Pt lungs CTA but diminished in the bases. Heart rate regular rate and rhythm. No s/sx cardiac or resp distress and no c/o such. Pt voids quantity sufficient without difficulty. Pt abdomen is soft flat nontender and nondistended. BS+x4. Pt denies nausea or vomiting. Pt repts passing gas. Pt tolerates diet fair to good.. Pt can turn/tilt self. No pressure skin issues noted of sacral area. Plan as per rept, pt is to receive IV antibodics per order.

## 2011-06-10 DIAGNOSIS — S91309A Unspecified open wound, unspecified foot, initial encounter: Secondary | ICD-10-CM

## 2011-06-10 LAB — CBC
HCT: 43 % (ref 39.0–52.0)
Hemoglobin: 14.2 g/dL (ref 13.0–17.0)
MCH: 27.6 pg (ref 26.0–34.0)
MCHC: 33 g/dL (ref 30.0–36.0)
MCV: 83.5 fL (ref 78.0–100.0)
Platelets: 327 10*3/uL (ref 150–400)
RBC: 5.15 MIL/uL (ref 4.22–5.81)
RDW: 13.1 % (ref 11.5–15.5)
WBC: 14.9 10*3/uL — ABNORMAL HIGH (ref 4.0–10.5)

## 2011-06-10 LAB — GLUCOSE, CAPILLARY
Glucose-Capillary: 154 mg/dL — ABNORMAL HIGH (ref 70–99)
Glucose-Capillary: 155 mg/dL — ABNORMAL HIGH (ref 70–99)
Glucose-Capillary: 111 mg/dL — ABNORMAL HIGH (ref 70–99)

## 2011-06-10 LAB — DIFFERENTIAL
Basophils Absolute: 0 10*3/uL (ref 0.0–0.1)
Basophils Relative: 0 % (ref 0–1)
Eosinophils Absolute: 0.1 10*3/uL (ref 0.0–0.7)
Eosinophils Relative: 1 % (ref 0–5)
Lymphocytes Relative: 49 % — ABNORMAL HIGH (ref 12–46)
Lymphs Abs: 7.4 10*3/uL — ABNORMAL HIGH (ref 0.7–4.0)
Monocytes Absolute: 0.7 10*3/uL (ref 0.1–1.0)
Monocytes Relative: 5 % (ref 3–12)
Neutro Abs: 6.7 10*3/uL (ref 1.7–7.7)
Neutrophils Relative %: 45 % (ref 43–77)

## 2011-06-10 LAB — VANCOMYCIN, TROUGH: Vancomycin Tr: 19.5 ug/mL (ref 10.0–20.0)

## 2011-06-10 MED ORDER — CIPROFLOXACIN IN D5W 400 MG/200ML IV SOLN
400.0000 mg | Freq: Two times a day (BID) | INTRAVENOUS | Status: DC
  Administered 2011-06-10 – 2011-06-12 (×4): 400 mg via INTRAVENOUS
  Filled 2011-06-10 (×6): qty 200

## 2011-06-10 MED ORDER — LISINOPRIL 40 MG PO TABS
40.0000 mg | ORAL_TABLET | Freq: Every day | ORAL | Status: DC
  Administered 2011-06-10 – 2011-06-12 (×3): 40 mg via ORAL
  Filled 2011-06-10 (×3): qty 1

## 2011-06-10 NOTE — Discharge Instructions (Signed)
Toni Arthurs, MD [facility]  Please read the following information regarding your care after surgery.  Medications  You only need a prescription for the narcotic pain medicine (ex. oxycodone, Percocet, Norco).  All of the other medicines listed below are available over the counter. X acetominophen (Tylenol) 650 mg every 4-6 hours as you need for minor pain X oxycodone as prescribed for moderate to severe pain ?   Narcotic pain medicine (ex. oxycodone, Percocet, Vicodin) will cause constipation.  To prevent this problem, take the following medicines while you are taking any pain medicine. X docusate sodium (Colace) 100 mg twice a day X senna (Senokot) 2 tablets twice a day  X To help prevent blood clots, take an aspirin (325 mg) once a day for a month after surgery.  You should also get up every hour while you are awake to move around.    Weight Bearing ? Bear weight when you are able on your operated leg or foot. X Bear weight only on the heel of your operated foot in the post-op shoe. ? Do not bear any weight on the operated leg or foot.  Cast / Splint / Dressing X Keep your splint or cast clean and dry.  Don't put anything (coat hanger, pencil, etc) down inside of it.  If it gets damp, use a hair dryer on the cool setting to dry it.  If it gets soaked, call the office to schedule an appointment for a cast change. ? Remove your dressing 3 days after surgery and cover the incisions with dry dressings.    After your dressing, cast or splint is removed; you may shower, but do not soak or scrub the wound.  Allow the water to run over it, and then gently pat it dry.  Swelling It is normal for you to have swelling where you had surgery.  To reduce swelling and pain, keep your toes above your nose for at least 3 days after surgery.  It may be necessary to keep your foot or leg elevated for several weeks.  If it hurts, it should be elevated.  Follow Up Call my office at  [phone] when you are discharged from the hospital or surgery center to schedule an appointment to be seen two weeks after surgery.  Call my office at [phone] if you develop a fever >101.5 F, nausea, vomiting, bleeding from the surgical site or severe pain.

## 2011-06-10 NOTE — Progress Notes (Signed)
Subjective: 2 Days Post-Op Procedure(s) (LRB): AMPUTATION RAY (Right) Patient reports pain as mild.  OOB walking yesterday.  Objective: Vital signs in last 24 hours: Temp:  [97.9 F (36.6 C)-98.6 F (37 C)] 97.9 F (36.6 C) (04/06 0704) Pulse Rate:  [58-64] 58  (04/06 0704) Resp:  [18] 18  (04/06 0704) BP: (159-168)/(64-72) 163/64 mmHg (04/06 0704) SpO2:  [95 %-100 %] 100 % (04/06 0704)  Intake/Output from previous day: 04/05 0701 - 04/06 0700 In: 440 [P.O.:240; IV Piggyback:200] Out: 600 [Urine:600] Intake/Output this shift:     Basename 06/10/11 0500 06/09/11 0600 06/07/11 1547  HGB 14.2 13.8 14.9    Basename 06/10/11 0500 06/09/11 0600  WBC 14.9* 17.0*  RBC 5.15 5.07  HCT 43.0 42.2  PLT 327 317    Basename 06/09/11 0600 06/07/11 1547  NA 138 137  K 4.2 4.1  CL 103 101  CO2 28 27  BUN 19 19  CREATININE 0.93 0.80  GLUCOSE 152* 132*  CALCIUM 9.0 9.2   No results found for this basename: LABPT:2,INR:2 in the last 72 hours  wound dressed and dry.  Cxs neg to date.  Assessment/Plan: 2 Days Post-Op Procedure(s) (LRB): AMPUTATION RAY (Right) Continue IV abx per ID recs.  Will plan to d/c home Monday AM upon final ID recs. OK to ambulate in hard sole shoe.  Toni Arthurs 06/10/2011, 7:39 AM

## 2011-06-10 NOTE — Progress Notes (Signed)
ANTIBIOTIC CONSULT NOTE - FOLLOW UP  Pharmacy Consult:  Vanc D#3 Indication:  right hallux osteomyelitis s/p amputation   Allergies  Allergen Reactions  . Penicillins Anaphylaxis    Patient Measurements: Height: 6' 0.83" (185 cm) Weight: 209 lb 14.1 oz (95.2 kg) IBW/kg (Calculated) : 79.52   Vital Signs: Temp: 97.9 F (36.6 C) (04/06 0704) BP: 163/64 mmHg (04/06 0704) Pulse Rate: 58  (04/06 0704) Intake/Output from previous day: 04/05 0701 - 04/06 0700 In: 440 [P.O.:240; IV Piggyback:200] Out: 600 [Urine:600] Intake/Output from this shift:    Labs:  Basename 06/10/11 0500 06/09/11 0600 06/07/11 1547  WBC 14.9* 17.0* 16.6*  HGB 14.2 13.8 14.9  PLT 327 317 400  LABCREA -- -- --  CREATININE -- 0.93 0.80   Estimated Creatinine Clearance: 96.2 ml/min (by C-G formula based on Cr of 0.93).  Basename 06/10/11 0530  VANCOTROUGH 19.5  VANCOPEAK --  VANCORANDOM --  GENTTROUGH --  GENTPEAK --  GENTRANDOM --  TOBRATROUGH --  TOBRAPEAK --  TOBRARND --  AMIKACINPEAK --  AMIKACINTROU --  AMIKACIN --     Microbiology: Recent Results (from the past 720 hour(s))  SURGICAL PCR SCREEN     Status: Normal   Collection Time   06/07/11  3:29 PM      Component Value Range Status Comment   MRSA, PCR NEGATIVE  NEGATIVE  Final    Staphylococcus aureus NEGATIVE  NEGATIVE  Final   TISSUE CULTURE     Status: Normal (Preliminary result)   Collection Time   06/08/11  3:41 PM      Component Value Range Status Comment   Specimen Description TISSUE FOOT RIGHT   Final    Special Requests NONE   Final    Gram Stain     Final    Value: NO WBC SEEN     NO SQUAMOUS EPITHELIAL CELLS SEEN     NO ORGANISMS SEEN   Culture Culture reincubated for better growth   Final    Report Status PENDING   Incomplete   ANAEROBIC CULTURE     Status: Normal (Preliminary result)   Collection Time   06/08/11  3:41 PM      Component Value Range Status Comment   Specimen Description TISSUE FOOT RIGHT   Final     Special Requests NONE   Final    Gram Stain     Final    Value: NO WBC SEEN     NO SQUAMOUS EPITHELIAL CELLS SEEN     NO ORGANISMS SEEN   Culture     Final    Value: NO ANAEROBES ISOLATED; CULTURE IN PROGRESS FOR 5 DAYS   Report Status PENDING   Incomplete     Anti-infectives     Start     Dose/Rate Route Frequency Ordered Stop   06/08/11 2300   vancomycin (VANCOCIN) IVPB 1000 mg/200 mL premix        1,000 mg 200 mL/hr over 60 Minutes Intravenous Every 8 hours 06/08/11 1657            Assessment: 60 YOM admitted 4/4 for amputation of right hallux d//t osteomyelitis and was started on vancomycin.  Vanc trough is therapeutic at 19.5 mcg/mL on vanc 1gm IV Q8H.  Patient's renal function has been stable, cultures pending.  Noted ID plan to d/c on PO doxy or Bactrim.   Goal of Therapy:  Vanc trough 15-20 mcg/mL   Plan:  - Continue vanc 1gm IV Q8H - Continue to  monitor renal fxn, C/S, repeat vanc trough if indicated    Mical Brun D. Laney Potash, PharmD, BCPS Pager:  [phone] 06/10/2011, 11:47 AM

## 2011-06-10 NOTE — Progress Notes (Signed)
INFECTIOUS DISEASE PROGRESS NOTE  ID: [name] is a 61 y.o. male with  Active Problems:  * No active hospital problems. *   Subjective: Circulating in wheel chair  Abtx:  Anti-infectives     Start     Dose/Rate Route Frequency Ordered Stop   06/08/11 2300   vancomycin (VANCOCIN) IVPB 1000 mg/200 mL premix        1,000 mg 200 mL/hr over 60 Minutes Intravenous Every 8 hours 06/08/11 1657            Medications:  Scheduled:   . aspirin EC  81 mg Oral Daily  . docusate sodium  100 mg Oral BID  . fluticasone  1 spray Each Nare Daily  . insulin aspart  0-15 Units Subcutaneous TID WC  . lisinopril  40 mg Oral Daily  . omega-3 acid ethyl esters  1 g Oral Daily  . senna  1 tablet Oral BID  . vancomycin  1,000 mg Intravenous Q8H  . DISCONTD: lisinopril  30 mg Oral Daily    Objective: Vital signs in last 24 hours: Temp:  [97.9 F (36.6 C)-98.6 F (37 C)] 98.2 F (36.8 C) (04/06 1418) Pulse Rate:  [58-70] 70  (04/06 1418) Resp:  [18] 18  (04/06 1418) BP: (149-163)/(64-72) 149/70 mmHg (04/06 1418) SpO2:  [95 %-100 %] 100 % (04/06 1418)   General appearance: alert, cooperative and no distress Extremities: R foot wrapped.   Lab Results  Basename 06/10/11 0500 06/09/11 0600 06/07/11 1547  WBC 14.9* 17.0* --  HGB 14.2 13.8 --  HCT 43.0 42.2 --  NA -- 138 137  K -- 4.2 4.1  CL -- 103 101  CO2 -- 28 27  BUN -- 19 19  CREATININE -- 0.93 0.80  GLU -- -- --   Liver Panel No results found for this basename: PROT:2,ALBUMIN:2,AST:2,ALT:2,ALKPHOS:2,BILITOT:2,BILIDIR:2,IBILI:2 in the last 72 hours Sedimentation Rate  Basename 06/09/11 0942  ESRSEDRATE 11   C-Reactive Protein  Basename 06/09/11 0942  CRP 0.28*    Microbiology: Recent Results (from the past 240 hour(s))  SURGICAL PCR SCREEN     Status: Normal   Collection Time   06/07/11  3:29 PM      Component Value Range Status Comment   MRSA, PCR NEGATIVE  NEGATIVE  Final    Staphylococcus aureus  NEGATIVE  NEGATIVE  Final   TISSUE CULTURE     Status: Normal (Preliminary result)   Collection Time   06/08/11  3:41 PM      Component Value Range Status Comment   Specimen Description TISSUE FOOT RIGHT   Final    Special Requests NONE   Final    Gram Stain     Final    Value: NO WBC SEEN     NO SQUAMOUS EPITHELIAL CELLS SEEN     NO ORGANISMS SEEN   Culture Culture reincubated for better growth   Final    Report Status PENDING   Incomplete   ANAEROBIC CULTURE     Status: Normal (Preliminary result)   Collection Time   06/08/11  3:41 PM      Component Value Range Status Comment   Specimen Description TISSUE FOOT RIGHT   Final    Special Requests NONE   Final    Gram Stain     Final    Value: NO WBC SEEN     NO SQUAMOUS EPITHELIAL CELLS SEEN     NO ORGANISMS SEEN   Culture  Final    Value: NO ANAEROBES ISOLATED; CULTURE IN PROGRESS FOR 5 DAYS   Report Status PENDING   Incomplete     Studies/Results: No results found.   Assessment/Plan: Osteomyelitis S/p resection of R great toe On vanco. Allergic to PEN- childhood "they said I would die if i ever had it again" Will d/c vanco. Start cipro Await final of cx, prelim is pseudomonas.  If doing well and pt very reliable, could go home with po cipro (allowing that his isolate is sensitive to this) for 2 weeks. Otherwise 2 weeks of IV therapy (cefepime? Would need test dose in hospital).   Johny Sax Infectious Diseases [phone] 06/10/2011, 2:44 PM

## 2011-06-10 NOTE — Progress Notes (Signed)
Subjective: He is doing well this morning, disappointed because he cannot go home today.   Objective: Vital signs in last 24 hours: Filed Vitals:   06/09/11 0605 06/09/11 1316 06/09/11 2136 06/10/11 0704  BP: 154/67 168/72 159/72 163/64  Pulse: 63 64 61 58  Temp: 97.6 F (36.4 C) 98.4 F (36.9 C) 98.6 F (37 C) 97.9 F (36.6 C)  TempSrc: Oral Oral    Resp: 18 18 18 18   Height:      Weight:      SpO2: 98% 98% 95% 100%   Weight change:   Intake/Output Summary (Last 24 hours) at 06/10/11 0716 Last data filed at 06/09/11 1717  Gross per 24 hour  Intake    440 ml  Output    600 ml  Net   -160 ml   Physical Exam: General: alert, well-developed, and cooperative to examination.  Lungs: normal respiratory effort, no accessory muscle use, normal breath sounds, no crackles, and no wheezes. Heart: normal rate, regular rhythm, no murmur, no gallop, and no rub.  Abdomen: soft, non-tender, normal bowel sounds, no distention, no guarding, no rebound tenderness  Extremities: s/p left BKA with clean stump, s/p right great toe amputation  Neurologic: alert & oriented X3, nonfocal, moving all 4 extremities Psych: Oriented X3, memory intact for recent and remote, normally interactive, good eye contact, not anxious appearing, and not depressed appearing.  Lab Results: Basic Metabolic Panel:  Lab 06/09/11 4401 06/07/11 1547  NA 138 137  K 4.2 4.1  CL 103 101  CO2 28 27  GLUCOSE 152* 132*  BUN 19 19  CREATININE 0.93 0.80  CALCIUM 9.0 9.2  MG -- --  PHOS -- --   CBC:  Lab 06/10/11 0500 06/09/11 0600  WBC 14.9* 17.0*  NEUTROABS PENDING --  HGB 14.2 13.8  HCT 43.0 42.2  MCV 83.5 83.2  PLT 327 317   CBG:  Lab 06/09/11 2213 06/09/11 1640 06/09/11 1103 06/09/11 0617 06/08/11 2202 06/08/11 1608  GLUCAP 130* 146* 181* 157* 120* 146*   Urine Drug Screen: Drugs of Abuse     Component Value Date/Time   LABOPIA NONE DETECTED 01/13/2009 0330   COCAINSCRNUR NONE DETECTED 01/13/2009  0330   LABBENZ NONE DETECTED 01/13/2009 0330   AMPHETMU NONE DETECTED 01/13/2009 0330   THCU NONE DETECTED 01/13/2009 0330   LABBARB  Value: NONE DETECTED        DRUG SCREEN FOR MEDICAL PURPOSES ONLY.  IF CONFIRMATION IS NEEDED FOR ANY PURPOSE, NOTIFY LAB WITHIN 5 DAYS.        LOWEST DETECTABLE LIMITS FOR URINE DRUG SCREEN Drug Class       Cutoff (ng/mL) Amphetamine      1000 Barbiturate      200 Benzodiazepine   200 Tricyclics       300 Opiates          300 Cocaine          300 THC              50 01/13/2009 0330      Micro Results: Recent Results (from the past 240 hour(s))  SURGICAL PCR SCREEN     Status: Normal   Collection Time   06/07/11  3:29 PM      Component Value Range Status Comment   MRSA, PCR NEGATIVE  NEGATIVE  Final    Staphylococcus aureus NEGATIVE  NEGATIVE  Final   TISSUE CULTURE     Status: Normal (Preliminary result)   Collection  Time   06/08/11  3:41 PM      Component Value Range Status Comment   Specimen Description TISSUE FOOT RIGHT   Final    Special Requests NONE   Final    Gram Stain PENDING   Incomplete    Culture NO GROWTH 1 DAY   Final    Report Status PENDING   Incomplete   ANAEROBIC CULTURE     Status: Normal (Preliminary result)   Collection Time   06/08/11  3:41 PM      Component Value Range Status Comment   Specimen Description TISSUE FOOT RIGHT   Final    Special Requests NONE   Final    Gram Stain     Final    Value: NO WBC SEEN     NO SQUAMOUS EPITHELIAL CELLS SEEN     NO ORGANISMS SEEN   Culture     Final    Value: NO ANAEROBES ISOLATED; CULTURE IN PROGRESS FOR 5 DAYS   Report Status PENDING   Incomplete    Studies/Results: No results found. Medications:  Scheduled Meds:   . aspirin EC  81 mg Oral Daily  . docusate sodium  100 mg Oral BID  . fluticasone  1 spray Each Nare Daily  . insulin aspart  0-15 Units Subcutaneous TID WC  . lisinopril  40 mg Oral Daily  . omega-3 acid ethyl esters  1 g Oral Daily  . senna  1 tablet Oral BID  .  vancomycin  1,000 mg Intravenous Q8H  . DISCONTD: lisinopril  30 mg Oral Daily   Continuous Infusions:  PRN Meds:.HYDROcodone-acetaminophen, metoCLOPramide (REGLAN) injection, metoCLOPramide, ondansetron (ZOFRAN) IV, ondansetron Assessment/Plan: 1. Right great toe osteomyelitis s/p amputation-stable, being managed by surgery team. Currently on Vancomycin. Wound culture prelim shows no growth.  2. HTN: SBP 150-160's. Not adequately controlled, which is likely secondary to post-op pain. -will increase Lisinopril to 40mg  qd today. Pain control with Vicodin.  3. DM II: Adequately controlled in acute setting.  HbA1c on 05/05/11 was 6.8. CBGs 130-180's.  -Will continue SSI-moderate scale for now      LOS: 2 days   [name] 06/10/2011, 7:16 AM

## 2011-06-11 LAB — GLUCOSE, CAPILLARY
Glucose-Capillary: 120 mg/dL — ABNORMAL HIGH (ref 70–99)
Glucose-Capillary: 151 mg/dL — ABNORMAL HIGH (ref 70–99)
Glucose-Capillary: 129 mg/dL — ABNORMAL HIGH (ref 70–99)
Glucose-Capillary: 139 mg/dL — ABNORMAL HIGH (ref 70–99)
Glucose-Capillary: 135 mg/dL — ABNORMAL HIGH (ref 70–99)

## 2011-06-11 NOTE — Progress Notes (Signed)
INFECTIOUS DISEASE PROGRESS NOTE  ID: [name] is a 60 y.o. male with   Active Problems:  * No active hospital problems. *   Subjective: Up in wheel chair.   Abtx:  Anti-infectives     Start     Dose/Rate Route Frequency Ordered Stop   06/10/11 1600   ciprofloxacin (CIPRO) IVPB 400 mg        400 mg 200 mL/hr over 60 Minutes Intravenous Every 12 hours 06/10/11 1450     06/08/11 2300   vancomycin (VANCOCIN) IVPB 1000 mg/200 mL premix  Status:  Discontinued        1,000 mg 200 mL/hr over 60 Minutes Intravenous Every 8 hours 06/08/11 1657 06/10/11 1450          Medications:  Scheduled:   . aspirin EC  81 mg Oral Daily  . ciprofloxacin  400 mg Intravenous Q12H  . docusate sodium  100 mg Oral BID  . fluticasone  1 spray Each Nare Daily  . insulin aspart  0-15 Units Subcutaneous TID WC  . lisinopril  40 mg Oral Daily  . omega-3 acid ethyl esters  1 g Oral Daily  . senna  1 tablet Oral BID  . DISCONTD: vancomycin  1,000 mg Intravenous Q8H    Objective: Vital signs in last 24 hours: Temp:  [97.8 F (36.6 C)-98.7 F (37.1 C)] 98.7 F (37.1 C) (04/07 0700) Pulse Rate:  [61-70] 61  (04/07 0700) Resp:  [18-20] 20  (04/07 0700) BP: (149-159)/(70-77) 159/77 mmHg (04/07 0700) SpO2:  [99 %-100 %] 99 % (04/07 0700)   General appearance: alert and no distress  Lab Results  Basename 06/10/11 0500 06/09/11 0600  WBC 14.9* 17.0*  HGB 14.2 13.8  HCT 43.0 42.2  NA -- 138  K -- 4.2  CL -- 103  CO2 -- 28  BUN -- 19  CREATININE -- 0.93  GLU -- --   Liver Panel No results found for this basename: PROT:2,ALBUMIN:2,AST:2,ALT:2,ALKPHOS:2,BILITOT:2,BILIDIR:2,IBILI:2 in the last 72 hours Sedimentation Rate  Basename 06/09/11 0942  ESRSEDRATE 11   C-Reactive Protein  Basename 06/09/11 0942  CRP 0.28*    Microbiology: Recent Results (from the past 240 hour(s))  SURGICAL PCR SCREEN     Status: Normal   Collection Time   06/07/11  3:29 PM      Component Value  Range Status Comment   MRSA, PCR NEGATIVE  NEGATIVE  Final    Staphylococcus aureus NEGATIVE  NEGATIVE  Final   TISSUE CULTURE     Status: Normal (Preliminary result)   Collection Time   06/08/11  3:41 PM      Component Value Range Status Comment   Specimen Description TISSUE FOOT RIGHT   Final    Special Requests NONE   Final    Gram Stain     Final    Value: NO WBC SEEN     NO SQUAMOUS EPITHELIAL CELLS SEEN     NO ORGANISMS SEEN   Culture RARE PSEUDOMONAS AERUGINOSA   Final    Report Status PENDING   Incomplete   ANAEROBIC CULTURE     Status: Normal (Preliminary result)   Collection Time   06/08/11  3:41 PM      Component Value Range Status Comment   Specimen Description TISSUE FOOT RIGHT   Final    Special Requests NONE   Final    Gram Stain     Final    Value: NO WBC SEEN  NO SQUAMOUS EPITHELIAL CELLS SEEN     NO ORGANISMS SEEN   Culture     Final    Value: NO ANAEROBES ISOLATED; CULTURE IN PROGRESS FOR 5 DAYS   Report Status PENDING   Incomplete     Studies/Results: No results found.   Assessment/Plan: Osteomyelitis  S/p resection of R great toe  On cipro  Await final of cx, prelim is pseudomonas. Await sensi.. Will continue IV cipro til d/c, if susceptible can change to PO for 2 weeks    Johny Sax Infectious Diseases [phone] 06/11/2011, 10:52 AM

## 2011-06-11 NOTE — Progress Notes (Signed)
Subjective: NO acute event overnight, still asking if he can go home.  Objective: Vital signs in last 24 hours: Filed Vitals:   06/10/11 0704 06/10/11 1418 06/10/11 2227 06/11/11 0700  BP: 163/64 149/70 149/70 159/77  Pulse: 58 70 68 61  Temp: 97.9 F (36.6 C) 98.2 F (36.8 C) 97.8 F (36.6 C) 98.7 F (37.1 C)  TempSrc:  Oral    Resp: 18 18 20 20   Height:      Weight:      SpO2: 100% 100% 99% 99%   Physical Exam: General: alert, well-developed, and cooperative to examination.  Lungs: normal respiratory effort, no accessory muscle use, normal breath sounds, no crackles, and no wheezes. Heart: normal rate, regular rhythm, no murmur, no gallop, and no rub.  Abdomen: soft, non-tender, normal bowel sounds, no distention, no guarding, no rebound tenderness  Extremities: s/p left BKA with clean stump, s/p right great toe amputation  Neurologic: alert & oriented X3, nonfocal, moving all 4 extremities Psych: Oriented X3, memory intact for recent and remote, normally interactive, good eye contact, not anxious appearing, and not depressed appearing.  Lab Results: Basic Metabolic Panel:  Lab 06/09/11 4166 06/07/11 1547  NA 138 137  K 4.2 4.1  CL 103 101  CO2 28 27  GLUCOSE 152* 132*  BUN 19 19  CREATININE 0.93 0.80  CALCIUM 9.0 9.2  MG -- --  PHOS -- --   CBC:  Lab 06/10/11 0500 06/09/11 0600  WBC 14.9* 17.0*  NEUTROABS 6.7 --  HGB 14.2 13.8  HCT 43.0 42.2  MCV 83.5 83.2  PLT 327 317  CBG:  Lab 06/11/11 0705 06/10/11 2139 06/10/11 1558 06/10/11 1112 06/10/11 0717 06/09/11 2213  GLUCAP 151* 120* 111* 155* 154* 130*    Drugs of Abuse     Component Value Date/Time   LABOPIA NONE DETECTED 01/13/2009 0330   COCAINSCRNUR NONE DETECTED 01/13/2009 0330   LABBENZ NONE DETECTED 01/13/2009 0330   AMPHETMU NONE DETECTED 01/13/2009 0330   THCU NONE DETECTED 01/13/2009 0330   LABBARB  Value: NONE DETECTED        DRUG SCREEN FOR MEDICAL PURPOSES ONLY.  IF CONFIRMATION IS NEEDED  FOR ANY PURPOSE, NOTIFY LAB WITHIN 5 DAYS.        LOWEST DETECTABLE LIMITS FOR URINE DRUG SCREEN Drug Class       Cutoff (ng/mL) Amphetamine      1000 Barbiturate      200 Benzodiazepine   200 Tricyclics       300 Opiates          300 Cocaine          300 THC              50 01/13/2009 0330      Micro Results: Recent Results (from the past 240 hour(s))  SURGICAL PCR SCREEN     Status: Normal   Collection Time   06/07/11  3:29 PM      Component Value Range Status Comment   MRSA, PCR NEGATIVE  NEGATIVE  Final    Staphylococcus aureus NEGATIVE  NEGATIVE  Final   TISSUE CULTURE     Status: Normal (Preliminary result)   Collection Time   06/08/11  3:41 PM      Component Value Range Status Comment   Specimen Description TISSUE FOOT RIGHT   Final    Special Requests NONE   Final    Gram Stain     Final    Value: NO  WBC SEEN     NO SQUAMOUS EPITHELIAL CELLS SEEN     NO ORGANISMS SEEN   Culture RARE PSEUDOMONAS AERUGINOSA   Final    Report Status PENDING   Incomplete   ANAEROBIC CULTURE     Status: Normal (Preliminary result)   Collection Time   06/08/11  3:41 PM      Component Value Range Status Comment   Specimen Description TISSUE FOOT RIGHT   Final    Special Requests NONE   Final    Gram Stain     Final    Value: NO WBC SEEN     NO SQUAMOUS EPITHELIAL CELLS SEEN     NO ORGANISMS SEEN   Culture     Final    Value: NO ANAEROBES ISOLATED; CULTURE IN PROGRESS FOR 5 DAYS   Report Status PENDING   Incomplete    Studies/Results: No results found. Medications: Scheduled Meds:   . aspirin EC  81 mg Oral Daily  . ciprofloxacin  400 mg Intravenous Q12H  . docusate sodium  100 mg Oral BID  . fluticasone  1 spray Each Nare Daily  . insulin aspart  0-15 Units Subcutaneous TID WC  . lisinopril  40 mg Oral Daily  . omega-3 acid ethyl esters  1 g Oral Daily  . senna  1 tablet Oral BID  . DISCONTD: vancomycin  1,000 mg Intravenous Q8H   Continuous Infusions:  PRN  Meds:.HYDROcodone-acetaminophen, metoCLOPramide (REGLAN) injection, metoCLOPramide, ondansetron (ZOFRAN) IV, ondansetron Assessment/Plan: 1. Right great toe osteomyelitis s/p amputation-stable, being managed by surgery team. Wound culture prelim shows rare pseudomonas. Vancomycin was d/c yesterday and is now on Cipro IV Day #2. Sensitivities still pending  2. HTN: SBP 140-160's. Not adequately controlled, which is likely secondary to post-op pain and being frustrated since he cannot go home.  -will continue Lisinopril to 40mg  qd since it was just increased yesterday. Pain control with Vicodin.   3. DM II: Well controlled in acute setting. HbA1c on 05/05/11 was 6.8. CBGs 111-150's.  -Will continue SSI-moderate scale for now.  Will not need any meds at discharge and will need to follow up with his PCP.    -We will sign off at this time since both his BP and DM are adequately controlled and we are not making changes to his regimen.  He will need to be sent home with Lisinopril 40mg  qd and no meds for DM at this time.  Need to follow up with his PCP in 1-2 weeks.   LOS: 3 days   Khrystyna Schwalm 06/11/2011, 9:15 AM

## 2011-06-11 NOTE — Progress Notes (Signed)
[name]  MRN: [id number] DOB/Age: 1950-04-11 61 y.o. Physician: Lynnea Maizes, M.D. 3 Days Post-Op Procedure(s) (LRB): AMPUTATION RAY (Right)  Subjective: No complaints, anxious to go home. Vital Signs Temp:  [97.8 F (36.6 C)-98.7 F (37.1 C)] 98.7 F (37.1 C) (04/07 0700) Pulse Rate:  [61-70] 61  (04/07 0700) Resp:  [18-20] 20  (04/07 0700) BP: (149-159)/(70-77) 159/77 mmHg (04/07 0700) SpO2:  [99 %-100 %] 99 % (04/07 0700)  Lab Results  Basename 06/10/11 0500 06/09/11 0600  WBC 14.9* 17.0*  HGB 14.2 13.8  HCT 43.0 42.2  PLT 327 317   BMET  Basename 06/09/11 0600  NA 138  K 4.2  CL 103  CO2 28  GLUCOSE 152*  BUN 19  CREATININE 0.93  CALCIUM 9.0   INR  Date Value Range Status  01/12/2009 1.28  0.00-1.49 (no units) Final     Exam  Post op dressing clean and dry. sensitivities pending  Plan D/C planning for Monday with ABX to be determined Christobal Morado M 06/11/2011, 10:31 AM

## 2011-06-12 DIAGNOSIS — M869 Osteomyelitis, unspecified: Secondary | ICD-10-CM

## 2011-06-12 LAB — GLUCOSE, CAPILLARY
Glucose-Capillary: 134 mg/dL — ABNORMAL HIGH (ref 70–99)
Glucose-Capillary: 153 mg/dL — ABNORMAL HIGH (ref 70–99)

## 2011-06-12 MED ORDER — HYDROCODONE-ACETAMINOPHEN 5-325 MG PO TABS: 1.0000 | ORAL_TABLET | Freq: Four times a day (QID) | ORAL | Status: AC | PRN

## 2011-06-12 MED ORDER — DOXYCYCLINE HYCLATE 100 MG PO TABS: 100.0000 mg | ORAL_TABLET | Freq: Two times a day (BID) | ORAL | Status: AC

## 2011-06-12 MED ORDER — CIPROFLOXACIN HCL 500 MG PO TABS: 500.0000 mg | ORAL_TABLET | Freq: Two times a day (BID) | ORAL | Status: AC

## 2011-06-12 MED ORDER — DOXYCYCLINE HYCLATE 100 MG PO TABS
100.0000 mg | ORAL_TABLET | Freq: Two times a day (BID) | ORAL | Status: DC
  Administered 2011-06-12: 100 mg via ORAL
  Filled 2011-06-12 (×3): qty 1

## 2011-06-12 NOTE — Progress Notes (Signed)
Subjective: Wants to leave today  Antibiotics:  Anti-infectives     Start     Dose/Rate Route Frequency Ordered Stop   06/12/11 0000   ciprofloxacin (CIPRO) 500 MG tablet        500 mg Oral 2 times daily 06/12/11 0742 06/22/11 2359   06/10/11 1600   ciprofloxacin (CIPRO) IVPB 400 mg        400 mg 200 mL/hr over 60 Minutes Intravenous Every 12 hours 06/10/11 1450     06/08/11 2300   vancomycin (VANCOCIN) IVPB 1000 mg/200 mL premix  Status:  Discontinued        1,000 mg 200 mL/hr over 60 Minutes Intravenous Every 8 hours 06/08/11 1657 06/10/11 1450          Medications: Scheduled Meds:   . aspirin EC  81 mg Oral Daily  . ciprofloxacin  400 mg Intravenous Q12H  . docusate sodium  100 mg Oral BID  . fluticasone  1 spray Each Nare Daily  . insulin aspart  0-15 Units Subcutaneous TID WC  . lisinopril  40 mg Oral Daily  . omega-3 acid ethyl esters  1 g Oral Daily  . senna  1 tablet Oral BID   Continuous Infusions:  PRN Meds:.HYDROcodone-acetaminophen, metoCLOPramide (REGLAN) injection, metoCLOPramide, ondansetron (ZOFRAN) IV, ondansetron   Objective: Weight change:   Intake/Output Summary (Last 24 hours) at 06/12/11 1011 Last data filed at 06/11/11 1800  Gross per 24 hour  Intake    600 ml  Output      0 ml  Net    600 ml   Blood pressure 162/86, pulse 73, temperature 98.2 F (36.8 C), temperature source Oral, resp. rate 18, height 6' 0.84" (1.85 m), weight 209 lb 14.1 oz (95.2 kg), SpO2 97.00%. Temp:  [97.9 F (36.6 C)-98.2 F (36.8 C)] 98.2 F (36.8 C) (04/08 0602) Pulse Rate:  [72-73] 73  (04/08 0602) Resp:  [18] 18  (04/08 0602) BP: (131-162)/(54-86) 162/86 mmHg (04/08 0602) SpO2:  [96 %-100 %] 97 % (04/08 0602)  Physical Exam: General: Alert and awake, oriented x3, not in any acute distress. HEENT: anicteric sclera, pupils reactive to light and accommodation, EOMI CVS regular rate, normal r,  no murmur rubs or gallops Chest: clear to auscultation  bilaterally, no wheezing, rales or rhonchi Abdomen: soft nontender, nondistended, normal bowel sounds, Extremities: dressing on lower extremity  Neuro: nonfocal  Lab Results:  Basename 06/10/11 0500  WBC 14.9*  HGB 14.2  HCT 43.0  PLT 327    BMET No results found for this basename: NA:2,K:2,CL:2,CO2:2,GLUCOSE:2,BUN:2,CREATININE:2,CALCIUM:2 in the last 72 hours  Micro Results: Recent Results (from the past 240 hour(s))  SURGICAL PCR SCREEN     Status: Normal   Collection Time   06/07/11  3:29 PM      Component Value Range Status Comment   MRSA, PCR NEGATIVE  NEGATIVE  Final    Staphylococcus aureus NEGATIVE  NEGATIVE  Final   TISSUE CULTURE     Status: Normal (Preliminary result)   Collection Time   06/08/11  3:41 PM      Component Value Range Status Comment   Specimen Description TISSUE FOOT RIGHT   Final    Special Requests NONE   Final    Gram Stain     Final    Value: NO WBC SEEN     NO SQUAMOUS EPITHELIAL CELLS SEEN     NO ORGANISMS SEEN   Culture     Final    Value:  RARE PSEUDOMONAS AERUGINOSA     MODERATE STAPHYLOCOCCUS SPECIES (COAGULASE NEGATIVE)     Note: RIFAMPIN AND GENTAMICIN SHOULD NOT BE USED AS SINGLE DRUGS FOR TREATMENT OF STAPH INFECTIONS.   Report Status PENDING   Incomplete    Organism ID, Bacteria PSEUDOMONAS AERUGINOSA   Final   ANAEROBIC CULTURE     Status: Normal (Preliminary result)   Collection Time   06/08/11  3:41 PM      Component Value Range Status Comment   Specimen Description TISSUE FOOT RIGHT   Final    Special Requests NONE   Final    Gram Stain     Final    Value: NO WBC SEEN     NO SQUAMOUS EPITHELIAL CELLS SEEN     NO ORGANISMS SEEN   Culture     Final    Value: NO ANAEROBES ISOLATED; CULTURE IN PROGRESS FOR 5 DAYS   Report Status PENDING   Incomplete     Studies/Results: No results found.    Assessment/Plan: [name] is a 61 y.o. male with  Osteomyelitis sp great hallux amputation his intraoperative cultures are  growing rare pseudomonas aeruginosa BUT ALSO MODERATE COAG NEGATIVE STAPH. Pseudomonas IS sensitive to cipro but CNS sensis are still pending.  1) OSteomyelitis sp amputation with CNS and PSeudomonas: --add doxycyline to the oral cipro --followup sensitivities for CNS (avail tomorrow) --treat for minimum of 2 weeks with close follow-up     LOS: 4 days   Acey Lav 06/12/2011, 10:11 AM

## 2011-06-12 NOTE — Progress Notes (Signed)
Subjective: 4 Days Post-Op Procedure(s) (LRB): AMPUTATION RAY (Right) Patient reports pain as mild.  Eager to go home.  On cipro now since cxs are growing pseudomonas.  Objective: Vital signs in last 24 hours: Temp:  [97.9 F (36.6 C)-98.2 F (36.8 C)] 98.2 F (36.8 C) (04/08 0602) Pulse Rate:  [72-73] 73  (04/08 0602) Resp:  [18] 18  (04/08 0602) BP: (131-162)/(54-86) 162/86 mmHg (04/08 0602) SpO2:  [96 %-100 %] 97 % (04/08 0602)  Intake/Output from previous day: 04/07 0701 - 04/08 0700 In: 840 [P.O.:840] Out: -  Intake/Output this shift:     Basename 06/10/11 0500  HGB 14.2    Basename 06/10/11 0500  WBC 14.9*  RBC 5.15  HCT 43.0  PLT 327   No results found for this basename: NA:2,K:2,CL:2,CO2:2,BUN:2,CREATININE:2,GLUCOSE:2,CALCIUM:2 in the last 72 hours No results found for this basename: LABPT:2,INR:2 in the last 72 hours  wound dressed and dry.  Assessment/Plan: 4 Days Post-Op Procedure(s) (LRB): AMPUTATION RAY (Right) Will plan to d/c home hopefully on oral cipro.  Toni Arthurs 06/12/2011, 7:38 AM

## 2011-06-12 NOTE — Discharge Summary (Signed)
Physician Discharge Summary  Patient ID: [name] MRN: [id number] DOB/AGE: Jun 24, 1950 61 y.o.  Admit date: 06/08/2011 Discharge date: 06/12/2011  Admission Diagnoses:  Right foot osteomyelitis, diabetes  Discharge Diagnoses:  Right foot osteo s/p right hallux amputation diabetes  Discharged Condition: stable  Hospital Course: Pt was admitted on 4/4 and underwent right hallux amputation. He tolerated the procedure well and was admitted to 5000 where he remained for his post op course.  cxs grew pseudomonas sensitive to doxy and cipr.  He is discharged to home in stable condition.  Consults: ID  Significant Diagnostic Studies: microbiology: wound culture: positive  Treatments: antibiotics: Cipro  Discharge Exam: Blood pressure 162/86, pulse 73, temperature 98.2 F (36.8 C), temperature source Oral, resp. rate 18, height 6' 0.84" (1.85 m), weight 95.2 kg (209 lb 14.1 oz), SpO2 97.00%. dressed and dry.  Disposition:   Discharge Orders    Future Appointments: Provider: Department: Dept Phone: Center:   07/10/2011 2:15 PM Verdene Rio, MD [facility] [phone] [facility]     Future Orders Please Complete By Expires   Diet - low sodium heart healthy      Call MD / Call 911      Comments:   If you experience chest pain or shortness of breath, CALL 911 and be transported to the hospital emergency room.  If you develope a fever above 101 F, pus (white drainage) or increased drainage or redness at the wound, or calf pain, call your surgeon's office.   Constipation Prevention      Comments:   Drink plenty of fluids.  Prune juice may be helpful.  You may use a stool softener, such as Colace (over the counter) 100 mg twice a day.  Use MiraLax (over the counter) for constipation as needed.   Increase activity slowly as tolerated        Medication List  As of 06/12/2011 12:51 PM   STOP taking these medications         clindamycin 300 MG capsule         TAKE these medications          aspirin EC 81 MG tablet   Take 81 mg by mouth daily.      ciprofloxacin 500 MG tablet   Commonly known as: CIPRO   Take 1 tablet (500 mg total) by mouth 2 (two) times daily.      doxycycline 100 MG tablet   Commonly known as: VIBRA-TABS   Take 1 tablet (100 mg total) by mouth every 12 (twelve) hours.      fish oil-omega-3 fatty acids 1000 MG capsule   Take 1 g by mouth daily.      fluticasone 50 MCG/ACT nasal spray   Commonly known as: FLONASE   Place 1 spray into the nose daily. Spray one spray in each nostril every day.  Please dispense a 3 month supply      glucose blood test strip   Use as instructed      HYDROcodone-acetaminophen 5-325 MG per tablet   Commonly known as: NORCO   Take 1-2 tablets by mouth every 6 (six) hours as needed for pain.      lisinopril 30 MG tablet   Commonly known as: PRINIVIL,ZESTRIL   Take 1 tablet (30 mg total) by mouth daily.      onetouch ultrasoft lancets   Use as instructed      sildenafil 100 MG tablet   Commonly known as: VIAGRA  Take 1 tablet (100 mg total) by mouth daily as needed. Take one tablet 30-60 min before activity.           Follow-up Information    Follow up with Toni Arthurs, MD in 2 weeks.   Contact information:   [address], [address] [place] [address] [phone]       Follow up with Johny Sax, MD in 2 weeks.   Contact information:   [address]  [address] [place] [address] [phone]          Signed: Toni Arthurs 06/12/2011, 12:51 PM

## 2011-06-13 LAB — TISSUE CULTURE: Gram Stain: NONE SEEN

## 2011-06-13 LAB — ANAEROBIC CULTURE: Gram Stain: NONE SEEN

## 2011-06-26 ENCOUNTER — Encounter: Payer: Self-pay | Admitting: Internal Medicine

## 2011-06-26 ENCOUNTER — Ambulatory Visit (INDEPENDENT_AMBULATORY_CARE_PROVIDER_SITE_OTHER): Payer: Federal, State, Local not specified - PPO | Admitting: Internal Medicine

## 2011-06-26 VITALS — BP 166/81 | HR 73 | Temp 97.9°F | Ht 73.0 in | Wt 208.2 lb

## 2011-06-26 DIAGNOSIS — M869 Osteomyelitis, unspecified: Secondary | ICD-10-CM

## 2011-06-26 NOTE — Progress Notes (Signed)
Patient ID: [name], male   DOB: 1951/03/04, 61 y.o.   MRN: [id number]  INFECTIOUS DISEASE PROGRESS NOTE  postop day 18   Subjective: [name] is a 61 year old who underwent amputation of his right great toe on April 4 because of osteomyelitis. Operative cultures grew coagulase-negative staph and Pseudomonas. He was discharged home on oral ciprofloxacin and doxycycline. He has a very minor amount of intermittent itching in his axillae but otherwise is tolerating his antibiotics well. The postoperative dressing was removed on April 19 for the first time. His sutures remain in place. He has had a little bit of bloody drainage from the suture line over the past several days.  Objective: Temp: 97.9 F (36.6 C) (04/22 1401) Temp src: Oral (04/22 1401) BP: 166/81 mmHg (04/22 1401) Pulse Rate: 73  (04/22 1401)  General: He is talkative and appears to be in good spirits. Skin: He has no rash under his arms where he has itching Right foot: He has some mild residual swelling and dusky redness over his foot distally. There is some dark eschar at the suture line and dried blood on his gauze dressing.    Assessment: He may be cured but I would feel better if he continued his antibiotics for a few more weeks. He is in agreement with that plan.  Plan: 1. Continue current antibiotics 2. Return in 2 weeks   Cliffton Asters, MD [facility] [phone] pager   [phone] cell 06/26/2011, 2:24 PM

## 2011-06-27 ENCOUNTER — Other Ambulatory Visit: Payer: Self-pay | Admitting: *Deleted

## 2011-06-27 MED ORDER — DOXYCYCLINE HYCLATE 100 MG PO CAPS: 100.0000 mg | ORAL_CAPSULE | Freq: Two times a day (BID) | ORAL | Status: DC

## 2011-06-27 MED ORDER — CIPROFLOXACIN HCL 500 MG PO TABS: 500.0000 mg | ORAL_TABLET | Freq: Two times a day (BID) | ORAL | Status: DC

## 2011-07-10 ENCOUNTER — Encounter: Payer: Federal, State, Local not specified - PPO | Admitting: Internal Medicine

## 2011-07-13 ENCOUNTER — Ambulatory Visit (INDEPENDENT_AMBULATORY_CARE_PROVIDER_SITE_OTHER): Payer: Federal, State, Local not specified - PPO | Admitting: Internal Medicine

## 2011-07-13 ENCOUNTER — Encounter: Payer: Self-pay | Admitting: Internal Medicine

## 2011-07-13 VITALS — BP 138/72 | HR 74 | Temp 98.9°F | Resp 20 | Ht 72.0 in | Wt 217.6 lb

## 2011-07-13 VITALS — BP 178/90 | HR 80 | Temp 98.0°F | Ht 73.0 in | Wt 214.5 lb

## 2011-07-13 DIAGNOSIS — F17200 Nicotine dependence, unspecified, uncomplicated: Secondary | ICD-10-CM

## 2011-07-13 DIAGNOSIS — I10 Essential (primary) hypertension: Secondary | ICD-10-CM

## 2011-07-13 DIAGNOSIS — E785 Hyperlipidemia, unspecified: Secondary | ICD-10-CM

## 2011-07-13 DIAGNOSIS — M869 Osteomyelitis, unspecified: Secondary | ICD-10-CM

## 2011-07-13 LAB — GLUCOSE, CAPILLARY: Glucose-Capillary: 156 mg/dL — ABNORMAL HIGH (ref 70–99)

## 2011-07-13 MED ORDER — LISINOPRIL 40 MG PO TABS: 40.0000 mg | ORAL_TABLET | Freq: Every day | ORAL | Status: DC

## 2011-07-13 NOTE — Assessment & Plan Note (Signed)
Patient has remained tobacco free for 6 weeks.  Encouraged pt to continue to avoid smoking

## 2011-07-13 NOTE — Assessment & Plan Note (Signed)
Patient completed 5 week course of cipro + doxy on 07/10/11.  He denies any fever and his amputation site is well healed.  ID does not recommend resumption of abx at this time; patient will f/u with Dr. Orvan Falconer in 1 month.    Patient is doing very well post-operatively.  Will continue to follow up ID and orthopedic recommendations.

## 2011-07-13 NOTE — Assessment & Plan Note (Signed)
BP elevated significantly above goal at patient's visit with Dr. Orvan Falconer earlier today. Pt had not taken BP meds prior to that visit.  His BP on arrival to [facility] is within normal limits and at goal.  Will continue lisinopril at current dose.  Electrolytes and renal function within normal limits on recent metabolic panels; will not repeat today.

## 2011-07-13 NOTE — Assessment & Plan Note (Addendum)
LDL above goal (155).  Patient not currently on statin therapy. Discussed importance of lipid control to reduce risk of heart disease and stroke.  LFTs wnl previously. Ideally, would like to start simvastatin 10mg  daily.  Patient would like to attempt further diet and lifestyle changes before starting another medication.  Will discuss at his next visit in ~80mo.

## 2011-07-13 NOTE — Progress Notes (Signed)
Patient ID: OSINACHI NAVARRETTE, male   DOB: 1950-04-27, 61 y.o.   MRN: [id number]  INFECTIOUS DISEASE PROGRESS NOTE    Subjective: Mr. Lambert Mody is in for his routine followup visit. He completed 5 weeks of antibiotic therapy with ciprofloxacin and doxycycline on May 6. He is feeling better. His itching has improved off of the antibiotics. His stitches were removed from his right foot last week.  Objective: Temp: 98 F (36.7 C) (05/09 1014) Temp src: Oral (05/09 1014) BP: 178/90 mmHg (05/09 1014) Pulse Rate: 80  (05/09 1014)  General: He is talkative and in good spirits Skin: No rash Right foot: He still has a little bit of eschar on the surgical incision at the stump of his right great toe amputation. There is no drainage or surrounding cellulitis. It is looking better.  Lab Results Lab Results  Component Value Date   CRP 0.28* 06/09/2011      Assessment: He still has some wound healing difficulty but no clear evidence of active infection at this time.  Plan: 1. Observe off of antibiotics 2. Followup in one month   Cliffton Asters, MD [facility]ious [facility] [phone] pager   [phone] cell 07/13/2011, 10:32 AM

## 2011-07-13 NOTE — Progress Notes (Signed)
Patient ID: [name], male   DOB: 12/12/1950, 61 y.o.   MRN: [id number] Subjective:    HPI Patient is a 61 y/o M here today for routine follow up   Review of Systems  Constitutional: Negative for fever, chills, diaphoresis, activity change, appetite change, fatigue and unexpected weight change.  HENT: Negative for hearing loss, congestion and neck stiffness.   Eyes: Negative for photophobia, pain and visual disturbance.  Respiratory: Negative for cough, chest tightness, shortness of breath and wheezing.   Cardiovascular: Negative for chest pain and palpitations.  Gastrointestinal: Negative for abdominal pain, blood in stool and anal bleeding.  Genitourinary: Negative for dysuria, hematuria and difficulty urinating.  Musculoskeletal: Negative for joint swelling.  Neurological: Negative for dizziness, syncope, speech difficulty, weakness, numbness and headaches.      Objective:   Physical Exam Vital signs reviewed GEN: No apparent distress.  Alert and oriented x 3.  Pleasant, conversant, and cooperative to exam. HEENT: head is autraumatic and normocephalic.  Neck is supple without palpable masses or lymphadenopathy.  No JVD or carotid bruits.  Vision intact.  EOMI.  PERRLA.  Sclerae anicteric.  Conjunctivae without pallor or injection. Mucous membranes are moist.  Oropharynx is without erythema, exudates, or other abnormal lesions.   RESP:  Lungs are clear to ascultation bilaterally with good air movement.  No wheezes, ronchi, or rubs. CARDIOVASCULAR: regular rate, normal rhythm.  Clear S1, S2, no murmurs, gallops, or rubs. EXT: warm and dry.  Right PT and DP pulses are intact and +1. Onychomycosis and clubbing of right toenails noted.   Left BKA noted SKIN: warm and dry.  No abnormal lesions.    Assessment/plan:

## 2011-08-03 ENCOUNTER — Ambulatory Visit (INDEPENDENT_AMBULATORY_CARE_PROVIDER_SITE_OTHER): Payer: Federal, State, Local not specified - PPO | Admitting: Internal Medicine

## 2011-08-03 VITALS — BP 136/74 | HR 69 | Temp 97.2°F | Wt 217.0 lb

## 2011-08-03 DIAGNOSIS — E785 Hyperlipidemia, unspecified: Secondary | ICD-10-CM

## 2011-08-03 DIAGNOSIS — M869 Osteomyelitis, unspecified: Secondary | ICD-10-CM

## 2011-08-03 DIAGNOSIS — I10 Essential (primary) hypertension: Secondary | ICD-10-CM

## 2011-08-03 DIAGNOSIS — Z79899 Other long term (current) drug therapy: Secondary | ICD-10-CM

## 2011-08-03 DIAGNOSIS — E119 Type 2 diabetes mellitus without complications: Secondary | ICD-10-CM

## 2011-08-03 DIAGNOSIS — N529 Male erectile dysfunction, unspecified: Secondary | ICD-10-CM

## 2011-08-03 DIAGNOSIS — J302 Other seasonal allergic rhinitis: Secondary | ICD-10-CM

## 2011-08-03 LAB — POCT GLYCOSYLATED HEMOGLOBIN (HGB A1C): Hemoglobin A1C: 7.1

## 2011-08-03 LAB — GLUCOSE, CAPILLARY: Glucose-Capillary: 142 mg/dL — ABNORMAL HIGH (ref 70–99)

## 2011-08-04 MED ORDER — LISINOPRIL 40 MG PO TABS: 40.0000 mg | ORAL_TABLET | Freq: Every day | ORAL | Status: DC

## 2011-08-04 MED ORDER — FLUTICASONE PROPIONATE 50 MCG/ACT NA SUSP: 2.0000 | Freq: Every day | NASAL | Status: DC

## 2011-08-04 MED ORDER — SILDENAFIL CITRATE 100 MG PO TABS: 100.0000 mg | ORAL_TABLET | Freq: Every day | ORAL | Status: DC | PRN

## 2011-08-04 NOTE — Assessment & Plan Note (Signed)
Patient is now status post right toe amputation performed on 06/08/2011. His surgical wound is healing well and he is off of all antibiotic therapy. He will see Dr. Victorino Dike (orthopedic surgery) later this week.

## 2011-08-04 NOTE — Assessment & Plan Note (Signed)
Patient continues to do remarkably well managing his diabetes with diet and exercise only. We'll obtain a hemoglobin A1c today; I expect it will be around 7.  Encouraged patient to keep up the good work

## 2011-08-04 NOTE — Assessment & Plan Note (Signed)
Patient's last lipid profile revealed an LDL above goal at 155. Patient does not wish to have a repeat lipid profile drawn today as he ate a sausage sandwich prior to visiting with me today. I believe it is reasonable to defer this until his next office visit. Patient understands the importance of repeating lipid profile testing and initiating therapy with a statin if needed.  Will plan to obtain a comprehensive metabolic panel at the time of lipid profile to assess his liver function.

## 2011-08-04 NOTE — Progress Notes (Signed)
Patient ID: [name], male   DOB: 15-Oct-1950, 61 y.o.   MRN: [id number] Subjective:    HPI Patient is a 61 y/o M here today for routine follow up.  He states the site of amputation of his right great toe continues to heal well. He denies any fevers, chills, purulent/bloody draining material from wound, or other associated symptom.  He has continued to maintain blood sugars averaging in 130s per his glucometer.  Review of Systems  Constitutional: Negative for fever, chills, diaphoresis, activity change, appetite change, fatigue and unexpected weight change.  HENT: Negative for hearing loss, congestion and neck stiffness.   Eyes: Negative for photophobia, pain and visual disturbance.  Respiratory: Negative for cough, chest tightness, shortness of breath and wheezing.   Cardiovascular: Negative for chest pain and palpitations.  Gastrointestinal: Negative for abdominal pain, blood in stool and anal bleeding.  Genitourinary: Negative for dysuria, hematuria and difficulty urinating.  Musculoskeletal: Negative for joint swelling.  Neurological: Negative for dizziness, syncope, speech difficulty, weakness, numbness and headaches.      Objective:   Physical Exam Vital signs reviewed GEN: No apparent distress.  Alert and oriented x 3.  Pleasant, conversant, and cooperative to exam. HEENT: head is autraumatic and normocephalic.  Neck is supple without palpable masses or lymphadenopathy.  No JVD or carotid bruits.  Vision intact.  EOMI.  PERRLA.  Sclerae anicteric.  Conjunctivae without pallor or injection. Mucous membranes are moist.  Oropharynx is without erythema, exudates, or other abnormal lesions.   RESP:  Lungs are clear to ascultation bilaterally with good air movement.  No wheezes, ronchi, or rubs. CARDIOVASCULAR: regular rate, normal rhythm.  Clear S1, S2, no murmurs, gallops, or rubs. EXT: warm and dry.  Right PT and DP pulses are intact and +1. Onychomycosis and clubbing of right  toenails noted.  Amputation of right great toe noted. Amputation site is healing well.   Left BKA noted. SKIN: warm and dry.  No abnormal lesions.    Assessment/plan:

## 2011-08-04 NOTE — Assessment & Plan Note (Signed)
Patient's blood pressure is at goal and well-controlled with lisinopril.  Will continue to monitor.

## 2011-08-14 ENCOUNTER — Ambulatory Visit: Payer: Federal, State, Local not specified - PPO | Admitting: Internal Medicine

## 2011-08-30 ENCOUNTER — Encounter: Payer: Self-pay | Admitting: Internal Medicine

## 2011-08-30 ENCOUNTER — Ambulatory Visit (INDEPENDENT_AMBULATORY_CARE_PROVIDER_SITE_OTHER): Payer: Federal, State, Local not specified - PPO | Admitting: Internal Medicine

## 2011-08-30 VITALS — BP 162/82 | HR 76 | Temp 98.3°F | Ht 73.0 in | Wt 222.2 lb

## 2011-08-30 DIAGNOSIS — M869 Osteomyelitis, unspecified: Secondary | ICD-10-CM

## 2011-08-30 NOTE — Progress Notes (Signed)
Patient ID: [name], male   DOB: 1950-10-19, 61 y.o.   MRN: [id number]    [facility] for Infectious Disease  Patient Active Problem List  Diagnosis  . DIABETES MELLITUS, TYPE II  . HYPERTENSION  . ERECTILE DYSFUNCTION, ORGANIC  . BKA, LEFT LEG  . Osteomyelitis of toe of right foot  . HLD (hyperlipidemia)    Patient's Medications  New Prescriptions   No medications on file  Previous Medications   ASPIRIN EC 81 MG TABLET    Take 81 mg by mouth daily.   FISH OIL-OMEGA-3 FATTY ACIDS 1000 MG CAPSULE    Take 1 g by mouth daily.   FLUTICASONE (FLONASE) 50 MCG/ACT NASAL SPRAY    Place 2 sprays into the nose daily.   GLUCOSE BLOOD (ONE TOUCH ULTRA TEST) TEST STRIP    Use as instructed   LANCETS (ONETOUCH ULTRASOFT) LANCETS    Use as instructed   LISINOPRIL (PRINIVIL,ZESTRIL) 30 MG TABLET    Take 30 mg by mouth daily.  Modified Medications   No medications on file  Discontinued Medications   FLUTICASONE (FLONASE) 50 MCG/ACT NASAL SPRAY    Place 1 spray into the nose daily. Spray one spray in each nostril every day. Please dispense a 3 month supply   LISINOPRIL (PRINIVIL,ZESTRIL) 40 MG TABLET    Take 1 tablet (40 mg total) by mouth daily.   SILDENAFIL (VIAGRA) 100 MG TABLET    Take 1 tablet (100 mg total) by mouth daily as needed. Take one tablet 30-60 min before activity.    Subjective: [name] is in for his routine followup visit. He is now been off of ciprofloxacin and doxycycline since May 6 following amputation of his right great toe. He says that he feels like he is doing better since he started riding his road bike several weeks ago. He states that his blood sugars have been under better control. He has not noticed any increased swelling, redness, pain or drainage from the surgical wound. He has not had any fever. He is frustrated that the wound has not fully healed. He states that he saw Dr. Victorino Dike today and has been referred to the wound center. He states that he has  not restarted smoking cigarettes.  Objective: Temp: 98.3 F (36.8 C) (06/26 1411) Temp src: Oral (06/26 1411) BP: 162/82 mmHg (06/26 1411) Pulse Rate: 76  (06/26 1411)  General: He is his usual cranky self Skin: No rash  Lungs: Clear Cor: Regular S1 and S2 no murmurs Right foot: His right great toe is surgically absent. The surgical wound is still open with some dark eschar medially and some yellow exudate centrally and laterally. There is no drainage. The skin around the wound is pain but does not have the appearance of acute cellulitis.  Lab Results Lab Results  Component Value Date   CRP 0.28* 06/09/2011    Lab Results  Component Value Date   ESRSEDRATE 11 06/09/2011      Assessment: He has poor wound healing but I do not think that he has active infection and would like to continue observation off of antibiotics.  Plan: 1. Continue observation off of antibiotics 2. He has been referred to the wound center 3. Return here in 6 weeks, sooner if he has any new signs or symptoms of recurrent infection   Cliffton Asters, MD [facility] for Infectious Disease [facility] [phone] pager   [phone] cell 08/30/2011, 2:35 PM

## 2011-08-31 LAB — C-REACTIVE PROTEIN: CRP: 0.1 mg/dL (ref <0.60)

## 2011-08-31 LAB — SEDIMENTATION RATE: Sed Rate: 4 mm/hr (ref 0–16)

## 2011-09-12 ENCOUNTER — Encounter (HOSPITAL_BASED_OUTPATIENT_CLINIC_OR_DEPARTMENT_OTHER): Payer: Federal, State, Local not specified - PPO | Attending: General Surgery

## 2011-09-12 DIAGNOSIS — E1142 Type 2 diabetes mellitus with diabetic polyneuropathy: Secondary | ICD-10-CM | POA: Insufficient documentation

## 2011-09-12 DIAGNOSIS — L97509 Non-pressure chronic ulcer of other part of unspecified foot with unspecified severity: Secondary | ICD-10-CM | POA: Insufficient documentation

## 2011-09-12 DIAGNOSIS — Y835 Amputation of limb(s) as the cause of abnormal reaction of the patient, or of later complication, without mention of misadventure at the time of the procedure: Secondary | ICD-10-CM | POA: Insufficient documentation

## 2011-09-12 DIAGNOSIS — S88119A Complete traumatic amputation at level between knee and ankle, unspecified lower leg, initial encounter: Secondary | ICD-10-CM | POA: Insufficient documentation

## 2011-09-12 DIAGNOSIS — S98119A Complete traumatic amputation of unspecified great toe, initial encounter: Secondary | ICD-10-CM | POA: Insufficient documentation

## 2011-09-12 DIAGNOSIS — Z87891 Personal history of nicotine dependence: Secondary | ICD-10-CM | POA: Insufficient documentation

## 2011-09-12 DIAGNOSIS — E1169 Type 2 diabetes mellitus with other specified complication: Secondary | ICD-10-CM | POA: Insufficient documentation

## 2011-09-12 DIAGNOSIS — T8130XA Disruption of wound, unspecified, initial encounter: Secondary | ICD-10-CM | POA: Insufficient documentation

## 2011-09-12 DIAGNOSIS — Z79899 Other long term (current) drug therapy: Secondary | ICD-10-CM | POA: Insufficient documentation

## 2011-09-12 DIAGNOSIS — I10 Essential (primary) hypertension: Secondary | ICD-10-CM | POA: Insufficient documentation

## 2011-09-12 DIAGNOSIS — E785 Hyperlipidemia, unspecified: Secondary | ICD-10-CM | POA: Insufficient documentation

## 2011-09-12 DIAGNOSIS — E1149 Type 2 diabetes mellitus with other diabetic neurological complication: Secondary | ICD-10-CM | POA: Insufficient documentation
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
## 2011-09-20 ENCOUNTER — Ambulatory Visit (INDEPENDENT_AMBULATORY_CARE_PROVIDER_SITE_OTHER): Payer: Federal, State, Local not specified - PPO | Admitting: *Deleted

## 2011-09-20 DIAGNOSIS — L97519 Non-pressure chronic ulcer of other part of right foot with unspecified severity: Secondary | ICD-10-CM

## 2011-09-20 DIAGNOSIS — L97509 Non-pressure chronic ulcer of other part of unspecified foot with unspecified severity: Secondary | ICD-10-CM

## 2011-09-29 ENCOUNTER — Telehealth: Payer: Self-pay | Admitting: *Deleted

## 2011-09-29 NOTE — Telephone Encounter (Signed)
Pt called and wants clinic to give him Metformin again. Had toe amputation recently and is going to Wound Center. Pt states was put on antibiotic that causes CBG to go up. CBG 129. Offered appt next week in clinic and also offered appt with Norm Parcel - per Lupita Leash request. Pt does not want to come to clinic - only wants med. If pt changes his mind - clinic will be glad to make appt. Stanton Kidney Jedd Schulenburg RN 09/29/11 2:15PM

## 2011-09-29 NOTE — Telephone Encounter (Signed)
Talked with Dr Kem Kays about note and states pt needs to be seen in clinic. Stanton Kidney Kellyjo Edgren RN 09/29/11 2:45PM

## 2011-10-10 ENCOUNTER — Encounter (HOSPITAL_BASED_OUTPATIENT_CLINIC_OR_DEPARTMENT_OTHER): Payer: Federal, State, Local not specified - PPO | Attending: General Surgery

## 2011-10-10 DIAGNOSIS — Y835 Amputation of limb(s) as the cause of abnormal reaction of the patient, or of later complication, without mention of misadventure at the time of the procedure: Secondary | ICD-10-CM | POA: Insufficient documentation

## 2011-10-10 DIAGNOSIS — S98119A Complete traumatic amputation of unspecified great toe, initial encounter: Secondary | ICD-10-CM | POA: Insufficient documentation

## 2011-10-10 DIAGNOSIS — L97509 Non-pressure chronic ulcer of other part of unspecified foot with unspecified severity: Secondary | ICD-10-CM | POA: Insufficient documentation

## 2011-10-10 DIAGNOSIS — T8189XA Other complications of procedures, not elsewhere classified, initial encounter: Secondary | ICD-10-CM | POA: Insufficient documentation

## 2011-10-10 DIAGNOSIS — E1169 Type 2 diabetes mellitus with other specified complication: Secondary | ICD-10-CM | POA: Insufficient documentation

## 2011-10-11 ENCOUNTER — Encounter: Payer: Self-pay | Admitting: Internal Medicine

## 2011-10-11 ENCOUNTER — Ambulatory Visit (INDEPENDENT_AMBULATORY_CARE_PROVIDER_SITE_OTHER): Payer: Federal, State, Local not specified - PPO | Admitting: Internal Medicine

## 2011-10-11 VITALS — BP 157/78 | HR 64 | Temp 97.9°F | Ht 73.0 in | Wt 214.5 lb

## 2011-10-11 DIAGNOSIS — M869 Osteomyelitis, unspecified: Secondary | ICD-10-CM

## 2011-10-11 NOTE — Progress Notes (Signed)
Patient ID: [name], male   DOB: March 15, 1950, 61 y.o.   MRN: [id number]    [facility] for Infectious Disease  Patient Active Problem List  Diagnosis  . DIABETES MELLITUS, TYPE II  . HYPERTENSION  . ERECTILE DYSFUNCTION, ORGANIC  . BKA, LEFT LEG  . Osteomyelitis of toe of right foot  . HLD (hyperlipidemia)    Patient's Medications  New Prescriptions   No medications on file  Previous Medications   ASPIRIN EC 81 MG TABLET    Take 81 mg by mouth daily.   DOXYCYCLINE (DORYX) 100 MG DR CAPSULE    Take 100 mg by mouth 2 (two) times daily.   FISH OIL-OMEGA-3 FATTY ACIDS 1000 MG CAPSULE    Take 1 g by mouth daily.   FLUTICASONE (FLONASE) 50 MCG/ACT NASAL SPRAY    Place 2 sprays into the nose daily.   GLUCOSE BLOOD (ONE TOUCH ULTRA TEST) TEST STRIP    Use as instructed   LANCETS (ONETOUCH ULTRASOFT) LANCETS    Use as instructed   LISINOPRIL (PRINIVIL,ZESTRIL) 30 MG TABLET    Take 30 mg by mouth daily.  Modified Medications   No medications on file  Discontinued Medications   No medications on file    Subjective: [name] is in for his followup visit. He continues to be followed at the wound center by Drs. Arkin and [employer]. Dr. Jimmey Ralph started him on doxycycline last month and had him refill yesterday. [name] is not aware of any cultures being taken recently. He has not gone back to smoking cigarettes in his been working very hard on his diet. His blood sugars have been running right around 100 recently.  Objective: Temp: 97.9 F (36.6 C) (08/07 1545) Temp src: Oral (08/07 1545) BP: 157/78 mmHg (08/07 1545) Pulse Rate: 64  (08/07 1545)  General: He is cantankerous as usual Skin: His foot is wrapped and he was told him not to take the dressing off until he returns to the wound center next week  Assessment: It is hard for me to assess the condition of his foot without been able to examine it. He is being followed by several doctors for this problem and I will leave  it in her hands for now. I will see him back on an as-needed basis.  Plan: 1. Continue followup at the Wound Center 2. Followup here as needed   Cliffton Asters, MD [facility] for Infectious Disease [facility] [phone] pager   [phone] cell 10/11/2011, 4:00 PM

## 2011-10-17 ENCOUNTER — Encounter (HOSPITAL_BASED_OUTPATIENT_CLINIC_OR_DEPARTMENT_OTHER): Payer: Federal, State, Local not specified - PPO

## 2011-10-20 ENCOUNTER — Encounter: Payer: Self-pay | Admitting: Vascular Surgery

## 2011-10-23 ENCOUNTER — Encounter: Payer: Self-pay | Admitting: Vascular Surgery

## 2011-10-23 ENCOUNTER — Ambulatory Visit (INDEPENDENT_AMBULATORY_CARE_PROVIDER_SITE_OTHER): Payer: Federal, State, Local not specified - PPO | Admitting: Vascular Surgery

## 2011-10-23 VITALS — BP 184/96 | HR 82 | Resp 20 | Ht 73.0 in | Wt 212.0 lb

## 2011-10-23 DIAGNOSIS — I739 Peripheral vascular disease, unspecified: Secondary | ICD-10-CM

## 2011-10-23 NOTE — Progress Notes (Signed)
Subjective:     Patient ID: [name], male   DOB: 1951/02/16, 61 y.o.   MRN: [id number]  HPI this 61 year old male was referred by Dr. Wiliam Ke in the wound Center to evaluate the right first toe amputation site." Was performed by Dr. Victorino Dike in April of 2013. It has been slow to completely heal but seems to be improving. The patient is to see Dr. Victorino Dike in a few days for further evaluation. He has had no chills and fever but has been on antibiotics in the past. He previously had a left leg amputation performed by Dr. Darrick Penna in 2010 for similar infectious process in the foot. Patient does have diabetes mellitus but currently does not take insulin  Past Medical History  Diagnosis Date  . Hypertension   . Gangrene of foot     Left, s/p KBA  . Anxiety   . Osteomyelitis of toe 06/08/11    right foot  . Seasonal allergies   . Type II diabetes mellitus     diet controlled  . Arthritis   . HOH (hard of hearing)     History  Substance Use Topics  . Smoking status: Former Smoker -- 1.5 packs/day for 22 years    Types: Cigarettes    Quit date: 05/24/2011  . Smokeless tobacco: Former Neurosurgeon    Types: Chew    Quit date: 03/07/1983  . Alcohol Use: Yes     06/08/11 "no liquor for 2 1/2 years; last beer 05/31/11"    Family History  Problem Relation Age of Onset  . Prostate cancer Father   . Hypertension Father   . Other Mother     VARICOSE VEINS    Allergies  Allergen Reactions  . Penicillins Anaphylaxis    Current outpatient prescriptions:aspirin EC 81 MG tablet, Take 81 mg by mouth daily., Disp: , Rfl: ;  doxycycline (DORYX) 100 MG DR capsule, Take 100 mg by mouth 2 (two) times daily., Disp: , Rfl: ;  fish oil-omega-3 fatty acids 1000 MG capsule, Take 1 g by mouth daily., Disp: , Rfl: ;  fluticasone (FLONASE) 50 MCG/ACT nasal spray, Place 2 sprays into the nose daily., Disp: 16 g, Rfl: 11 glucose blood (ONE TOUCH ULTRA TEST) test strip, Use as instructed, Disp: 100 each, Rfl: 12;   Lancets (ONETOUCH ULTRASOFT) lancets, Use as instructed, Disp: 100 each, Rfl: 12;  lisinopril (PRINIVIL,ZESTRIL) 30 MG tablet, Take 40 mg by mouth daily. , Disp: , Rfl:   BP 184/96  Pulse 82  Resp 20  Ht 6\' 1"  (1.854 m)  Wt 212 lb (96.163 kg)  BMI 27.97 kg/m2  Body mass index is 27.97 kg/(m^2).         Review of Systems denies chest pain, dyspnea on exertion, PND, orthopnea, hemoptysis, lateralizing weakness, syncope, amaurosis fugax.    Objective:   Physical Exam blood pressure 184/96 heart rate 82 respirations 20 Gen.-alert and oriented x3 in no apparent distress HEENT normal for age Lungs no rhonchi or wheezing Cardiovascular regular rhythm no murmurs carotid pulses 3+ palpable no bruits audible Abdomen soft nontender no palpable masses Musculoskeletal free of  major deformities Skin clear -no rashes Neurologic normal Lower extremities 3+ femoral and 2+ popliteal pulse palpable right leg. No distal pulses palpable. His right first toe amputation site which has some mild erythema. There is a thin eschar overlying the wound and very minimal drainage with compression. There is no fluctuance. Left leg has 3+ femoral pulse and a prosthesis in place for  a below knee amputation.  Today I reviewed the arterial Doppler studies performed in our office 09/20/2011. ABI on the right is 0.72.       Assessment:     Slowly healing right first toe agitation site performed by Dr. Victorino Dike in April 2013-previous left BKA by Dr. 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 Victorino Dike. If amputation site continues to not heal completely one option would be to perform right femoral angiogram to see if peripheral intervention feasible. Patient will be seeing Dr. Victorino Dike later this week. If he should decide that we need more information than we would be happy to arrange angiogram of right leg with possible PTA and stenting by Dr. Darrick Penna

## 2011-11-08 ENCOUNTER — Encounter (HOSPITAL_BASED_OUTPATIENT_CLINIC_OR_DEPARTMENT_OTHER): Payer: Federal, State, Local not specified - PPO | Attending: General Surgery

## 2011-11-08 DIAGNOSIS — L97509 Non-pressure chronic ulcer of other part of unspecified foot with unspecified severity: Secondary | ICD-10-CM | POA: Insufficient documentation

## 2011-11-08 DIAGNOSIS — E1169 Type 2 diabetes mellitus with other specified complication: Secondary | ICD-10-CM | POA: Insufficient documentation

## 2011-11-08 DIAGNOSIS — S98119A Complete traumatic amputation of unspecified great toe, initial encounter: Secondary | ICD-10-CM | POA: Insufficient documentation

## 2011-11-08 DIAGNOSIS — L84 Corns and callosities: Secondary | ICD-10-CM | POA: Insufficient documentation

## 2011-12-06 ENCOUNTER — Encounter (HOSPITAL_BASED_OUTPATIENT_CLINIC_OR_DEPARTMENT_OTHER): Payer: Federal, State, Local not specified - PPO

## 2012-09-12 ENCOUNTER — Other Ambulatory Visit: Payer: Self-pay

## 2017-11-13 ENCOUNTER — Ambulatory Visit
Admission: RE | Admit: 2017-11-13 | Discharge: 2017-11-13 | Disposition: A | Discharge status: Home / Self Care | Payer: Federal, State, Local not specified - PPO | Source: Ambulatory Visit | Attending: Internal Medicine | Admitting: Internal Medicine

## 2017-11-13 ENCOUNTER — Other Ambulatory Visit: Payer: Self-pay | Admitting: Internal Medicine

## 2017-11-13 DIAGNOSIS — B0803 Pseudocowpox [milker's node]: Secondary | ICD-10-CM | POA: Diagnosis present

## 2017-12-21 ENCOUNTER — Other Ambulatory Visit: Payer: Self-pay | Admitting: Otolaryngology

## 2017-12-21 DIAGNOSIS — R221 Localized swelling, mass and lump, neck: Secondary | ICD-10-CM

## 2017-12-25 ENCOUNTER — Inpatient Hospital Stay
Admission: EM | Admit: 2017-12-25 | Discharge: 2017-12-27 | DRG: 988 | Disposition: A | Discharge status: Home / Self Care | Payer: Medicare Other | Attending: Internal Medicine | Admitting: Internal Medicine

## 2017-12-25 ENCOUNTER — Encounter: Payer: Self-pay | Admitting: Emergency Medicine

## 2017-12-25 ENCOUNTER — Ambulatory Visit
Admission: RE | Admit: 2017-12-25 | Discharge: 2017-12-25 | Disposition: A | Discharge status: Home / Self Care | Payer: Federal, State, Local not specified - PPO | Source: Ambulatory Visit | Attending: Otolaryngology | Admitting: Otolaryngology

## 2017-12-25 ENCOUNTER — Other Ambulatory Visit: Payer: Self-pay

## 2017-12-25 DIAGNOSIS — Z89512 Acquired absence of left leg below knee: Secondary | ICD-10-CM

## 2017-12-25 DIAGNOSIS — J302 Other seasonal allergic rhinitis: Secondary | ICD-10-CM | POA: Diagnosis present

## 2017-12-25 DIAGNOSIS — Z7982 Long term (current) use of aspirin: Secondary | ICD-10-CM | POA: Diagnosis not present

## 2017-12-25 DIAGNOSIS — R590 Localized enlarged lymph nodes: Secondary | ICD-10-CM | POA: Diagnosis present

## 2017-12-25 DIAGNOSIS — E119 Type 2 diabetes mellitus without complications: Secondary | ICD-10-CM

## 2017-12-25 DIAGNOSIS — M1990 Unspecified osteoarthritis, unspecified site: Secondary | ICD-10-CM | POA: Diagnosis present

## 2017-12-25 DIAGNOSIS — E785 Hyperlipidemia, unspecified: Secondary | ICD-10-CM | POA: Diagnosis present

## 2017-12-25 DIAGNOSIS — R944 Abnormal results of kidney function studies: Secondary | ICD-10-CM | POA: Diagnosis not present

## 2017-12-25 DIAGNOSIS — Z8249 Family history of ischemic heart disease and other diseases of the circulatory system: Secondary | ICD-10-CM | POA: Diagnosis not present

## 2017-12-25 DIAGNOSIS — Z79899 Other long term (current) drug therapy: Secondary | ICD-10-CM

## 2017-12-25 DIAGNOSIS — E1151 Type 2 diabetes mellitus with diabetic peripheral angiopathy without gangrene: Secondary | ICD-10-CM | POA: Diagnosis present

## 2017-12-25 DIAGNOSIS — I10 Essential (primary) hypertension: Secondary | ICD-10-CM | POA: Diagnosis present

## 2017-12-25 DIAGNOSIS — C8305 Small cell B-cell lymphoma, lymph nodes of inguinal region and lower limb: Secondary | ICD-10-CM | POA: Diagnosis present

## 2017-12-25 DIAGNOSIS — I2699 Other pulmonary embolism without acute cor pulmonale: Secondary | ICD-10-CM | POA: Insufficient documentation

## 2017-12-25 DIAGNOSIS — N183 Chronic kidney disease, stage 3 (moderate): Secondary | ICD-10-CM | POA: Diagnosis present

## 2017-12-25 DIAGNOSIS — Z880 Allergy status to penicillin: Secondary | ICD-10-CM

## 2017-12-25 DIAGNOSIS — R221 Localized swelling, mass and lump, neck: Secondary | ICD-10-CM

## 2017-12-25 DIAGNOSIS — I129 Hypertensive chronic kidney disease with stage 1 through stage 4 chronic kidney disease, or unspecified chronic kidney disease: Secondary | ICD-10-CM | POA: Diagnosis present

## 2017-12-25 DIAGNOSIS — I5030 Unspecified diastolic (congestive) heart failure: Secondary | ICD-10-CM | POA: Diagnosis not present

## 2017-12-25 DIAGNOSIS — C9110 Chronic lymphocytic leukemia of B-cell type not having achieved remission: Secondary | ICD-10-CM | POA: Diagnosis present

## 2017-12-25 DIAGNOSIS — H9190 Unspecified hearing loss, unspecified ear: Secondary | ICD-10-CM | POA: Diagnosis present

## 2017-12-25 DIAGNOSIS — Z87891 Personal history of nicotine dependence: Secondary | ICD-10-CM | POA: Diagnosis not present

## 2017-12-25 DIAGNOSIS — Z7984 Long term (current) use of oral hypoglycemic drugs: Secondary | ICD-10-CM

## 2017-12-25 DIAGNOSIS — Z7951 Long term (current) use of inhaled steroids: Secondary | ICD-10-CM

## 2017-12-25 DIAGNOSIS — R591 Generalized enlarged lymph nodes: Secondary | ICD-10-CM

## 2017-12-25 DIAGNOSIS — R161 Splenomegaly, not elsewhere classified: Secondary | ICD-10-CM | POA: Diagnosis not present

## 2017-12-25 DIAGNOSIS — N189 Chronic kidney disease, unspecified: Secondary | ICD-10-CM | POA: Diagnosis present

## 2017-12-25 DIAGNOSIS — E1122 Type 2 diabetes mellitus with diabetic chronic kidney disease: Secondary | ICD-10-CM | POA: Diagnosis present

## 2017-12-25 DIAGNOSIS — N179 Acute kidney failure, unspecified: Secondary | ICD-10-CM | POA: Diagnosis present

## 2017-12-25 DIAGNOSIS — I739 Peripheral vascular disease, unspecified: Secondary | ICD-10-CM | POA: Diagnosis present

## 2017-12-25 DIAGNOSIS — E1129 Type 2 diabetes mellitus with other diabetic kidney complication: Secondary | ICD-10-CM

## 2017-12-25 LAB — CBC WITH DIFFERENTIAL/PLATELET
Abs Immature Granulocytes: 0.03 10*3/uL (ref 0.00–0.07)
Basophils Absolute: 0.1 10*3/uL (ref 0.0–0.1)
Basophils Relative: 1 %
Eosinophils Absolute: 0.2 10*3/uL (ref 0.0–0.5)
Eosinophils Relative: 2 %
HCT: 39.6 % (ref 39.0–52.0)
Hemoglobin: 12.8 g/dL — ABNORMAL LOW (ref 13.0–17.0)
Immature Granulocytes: 0 %
Lymphocytes Relative: 55 %
Lymphs Abs: 7.3 10*3/uL — ABNORMAL HIGH (ref 0.7–4.0)
MCH: 27.5 pg (ref 26.0–34.0)
MCHC: 32.3 g/dL (ref 30.0–36.0)
MCV: 85 fL (ref 80.0–100.0)
Monocytes Absolute: 1.8 10*3/uL — ABNORMAL HIGH (ref 0.1–1.0)
Monocytes Relative: 14 %
Neutro Abs: 3.7 10*3/uL (ref 1.7–7.7)
Neutrophils Relative %: 28 %
Platelets: 294 10*3/uL (ref 150–400)
RBC: 4.66 MIL/uL (ref 4.22–5.81)
RDW: 13.6 % (ref 11.5–15.5)
Smear Review: ADEQUATE
WBC: 13.1 10*3/uL — ABNORMAL HIGH (ref 4.0–10.5)
nRBC: 0 % (ref 0.0–0.2)

## 2017-12-25 LAB — POCT I-STAT CREATININE: Creatinine, Ser: 1.5 mg/dL — ABNORMAL HIGH (ref 0.61–1.24)

## 2017-12-25 LAB — BASIC METABOLIC PANEL
Anion gap: 13 (ref 5–15)
BUN: 37 mg/dL — ABNORMAL HIGH (ref 8–23)
CO2: 22 mmol/L (ref 22–32)
Calcium: 9.3 mg/dL (ref 8.9–10.3)
Chloride: 104 mmol/L (ref 98–111)
Creatinine, Ser: 1.59 mg/dL — ABNORMAL HIGH (ref 0.61–1.24)
GFR calc Af Amer: 51 mL/min — ABNORMAL LOW (ref >60)
GFR calc non Af Amer: 44 mL/min — ABNORMAL LOW (ref >60)
Glucose, Bld: 189 mg/dL — ABNORMAL HIGH (ref 70–99)
Potassium: 4.5 mmol/L (ref 3.5–5.1)
Sodium: 139 mmol/L (ref 135–145)

## 2017-12-25 LAB — PROTIME-INR
INR: 0.94
Prothrombin Time: 12.5 seconds (ref 11.4–15.2)

## 2017-12-25 LAB — TROPONIN I: Troponin I: <0.03 ng/mL (ref <0.03)

## 2017-12-25 MED ORDER — IOPAMIDOL (ISOVUE-300) INJECTION 61%
80.0000 mL | Freq: Once | INTRAVENOUS | Status: AC | PRN
  Administered 2017-12-25: 80 mL via INTRAVENOUS

## 2017-12-25 MED ORDER — ENOXAPARIN SODIUM 120 MG/0.8ML ~~LOC~~ SOLN
1.0000 mg/kg | Freq: Two times a day (BID) | SUBCUTANEOUS | Status: DC
  Administered 2017-12-25: 110 mg via SUBCUTANEOUS
  Filled 2017-12-25 (×2): qty 0.8

## 2017-12-25 MED ORDER — IOPAMIDOL (ISOVUE-300) INJECTION 61%: 100.0000 mL | Freq: Once | INTRAVENOUS | Status: DC | PRN

## 2017-12-25 NOTE — ED Provider Notes (Signed)
[facility] Emergency Department Provider Note ____________________________________________   First MD Initiated Contact with Patient 12/25/17 2310     (approximate)  I have reviewed the triage vital signs and the nursing notes.   HISTORY  Chief Complaint Lymphadenopathy    HPI [name] is a 67 y.o. male with PMH as noted below who presents for evaluation with pulmonary embolism, unknown onset, discovered on a CT performed today, and associated with chest pain a few days ago which is now resolved.  The patient has significant lymph node swelling recently and was seen by ENT today and sent for CT of his neck, chest, and abdomen and pelvis.  CT of the chest showed acute appearing pulmonary embolism.  The patient denies any acute symptoms.  He denies prior history of PE.   Past Medical History:  Diagnosis Date  . Anxiety   . Arthritis   . 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 HOH (hard of hearing)   . Hypertension   . Osteomyelitis of toe ([place]) 06/08/11   right foot  . Seasonal allergies   . Type II diabetes mellitus ([place])    diet controlled    Patient Active Problem List   Diagnosis Date Noted  . Bilateral pulmonary embolism ([place]) 12/25/2017  . CKD (chronic kidney disease), stage III ([place]) 12/25/2017  . Thoracic lymphadenopathy 12/25/2017  . PAD (peripheral artery disease) ([place]) 10/23/2011  . HLD (hyperlipidemia) 07/13/2011  . Osteomyelitis of toe of right foot ([place]) 06/26/2011  . 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 Diabetes mellitus, type 2 ([place]) 01/26/2009  . Essential hypertension 01/26/2009  . BKA, LEFT LEG 01/26/2009    Past Surgical History:  Procedure Laterality Date  . AMPUTATION  06/08/2011   Procedure: AMPUTATION RAY;  Surgeon: Wylene Simmer, MD;  Location: [place];  Service: Orthopedics;  Laterality: Right;  [place] Amputation  . [place]   "crushed"  . FRACTURE SURGERY    . LEG  AMPUTATION BELOW KNEE  01/2009   left  . PILONIDAL CYST / SINUS EXCISION  1970's  . TOE AMPUTATION  06/08/11   partial; right great toe    Prior to Admission medications   Medication Sig Start Date End Date Taking? Authorizing Provider  aspirin EC 81 MG tablet Take 81 mg by mouth daily.   Yes [provider]  ECHINACEA PO Take 760 mg by mouth daily.   Yes [provider]  fluticasone (FLONASE) 50 MCG/ACT nasal spray Place 2 sprays into both nostrils daily as needed for allergies or rhinitis.   Yes [provider]  Ginseng 100 MG CAPS Take 100 mg by mouth daily.   Yes [provider]  hydrochlorothiazide (MICROZIDE) 12.5 MG capsule Take 12.5 mg by mouth daily. 10/19/17  Yes [provider]  JANUVIA 100 MG tablet Take 100 mg by mouth daily. 10/12/17  Yes [provider]  lisinopril (PRINIVIL,ZESTRIL) 40 MG tablet Take 40 mg by mouth daily. 11/10/17  Yes [provider]  metFORMIN (GLUCOPHAGE) 500 MG tablet Take 1,000 mg by mouth 2 (two) times daily. 11/10/17  Yes [provider]  Omega-3 Fatty Acids (OMEGA 3 500) 500 MG CAPS Take 500 mg by mouth daily.   Yes [provider]  pravastatin (PRAVACHOL) 40 MG tablet Take 40 mg by mouth daily. 11/10/17  Yes [provider]  Saw Palmetto 450 MG CAPS Take 450 mg by mouth daily.   Yes  [provider]  fluticasone (FLONASE) 50 MCG/ACT nasal spray Place 2 sprays into the nose daily. 08/04/11 08/03/12  Nils Pyle, MD    Allergies Penicillins  Family History  Problem Relation Age of Onset  . Prostate cancer Father   . Hypertension Father   . Other Mother        VARICOSE VEINS    Social History Social History   Tobacco Use  . Smoking status: Former Smoker    Packs/day: 1.50    Years: 22.00    Pack years: 33.00    Types: Cigarettes    Last attempt to quit: 05/24/2011    Years since quitting: 6.5  . Smokeless tobacco: Former Systems developer    Types: Chew     Quit date: 03/07/1983  Substance Use Topics  . Alcohol use: Yes    Comment: 06/08/11 "no liquor for 2 1/2 years; last beer 05/31/11"  . Drug use: No    Review of Systems  Constitutional: No fever. Eyes: No redness. ENT: No sore throat. Cardiovascular: Denies chest pain. Respiratory: Denies shortness of breath. Gastrointestinal: No vomiting.  Genitourinary: Negative for flank pain.  Musculoskeletal: Negative for back pain. Skin: Negative for rash. Neurological: Negative for headache.   ____________________________________________   PHYSICAL EXAM:  VITAL SIGNS: ED Triage Vitals  Enc Vitals Group     BP 12/25/17 2006 (!) 151/76     Pulse Rate 12/25/17 2006 95     Resp 12/25/17 2006 18     Temp 12/25/17 2006 98.1 F (36.7 C)     Temp Source 12/25/17 2006 Oral     SpO2 12/25/17 2006 98 %     Weight 12/25/17 2007 239 lb (108.4 kg)     Height 12/25/17 2007 6\' 1"  (1.854 m)     Head Circumference --      Peak Flow --      Pain Score 12/25/17 2007 0     Pain Loc --      Pain Edu? --      Excl. in [place]? --     Constitutional: Alert and oriented. Well appearing and in no acute distress. Eyes: Conjunctivae are normal.  Head: Atraumatic. Nose: No congestion/rhinnorhea. Mouth/Throat: Mucous membranes are moist.   Neck: Normal range of motion.  Bilateral anterior cervical lymphadenopathy. Cardiovascular: Normal rate, regular rhythm. Grossly normal heart sounds.  Good peripheral circulation. Respiratory: Normal respiratory effort.  No retractions. Lungs CTAB. Gastrointestinal: No distention.  Musculoskeletal:  Extremities warm and well perfused.  Neurologic:  Normal speech and language. No gross focal neurologic deficits are appreciated.  Skin:  Skin is warm and dry. No rash noted. Psychiatric: Mood and affect are normal. Speech and behavior are normal.  ____________________________________________   LABS (all labs ordered are listed, but only abnormal results are  displayed)  Labs Reviewed  BASIC METABOLIC PANEL - Abnormal; Notable for the following components:      Result Value   Glucose, Bld 189 (*)    BUN 37 (*)    Creatinine, Ser 1.59 (*)    GFR calc non Af Amer 44 (*)    GFR calc Af Amer 51 (*)    All other components within normal limits  CBC WITH DIFFERENTIAL/PLATELET - Abnormal; Notable for the following components:   WBC 13.1 (*)    Hemoglobin 12.8 (*)    Lymphs Abs 7.3 (*)    Monocytes Absolute 1.8 (*)    All other components within normal limits  TROPONIN I  PROTIME-INR  APTT   ____________________________________________  EKG  ED ECG REPORT I, Arta Silence, the attending physician, personally viewed and interpreted this ECG.  Date: 12/25/2017 EKG Time: 2022 Rate: 92 Rhythm: normal sinus rhythm QRS Axis: normal Intervals: normal ST/T Wave abnormalities: Nonspecific lateral T abnormalities Narrative Interpretation: no evidence of acute ischemia  ____________________________________________  RADIOLOGY    ____________________________________________   PROCEDURES  Procedure(s) performed: No  Procedures  Critical Care performed: No ____________________________________________   INITIAL IMPRESSION / ASSESSMENT AND PLAN / ED COURSE  Pertinent labs & imaging results that were available during my care of the patient were reviewed by me and considered in my medical decision making (see chart for details).  67 year old male with PMH as noted above presents for evaluation after he was diagnosed with likely acute PE today on an outpatient CT.  I was initially contacted by Dr. Pryor Ochoa from ENT who evaluated the patient today and obtained the CT.  The patient is currently being worked up for lymphadenopathy and concern for lymphoma.  The patient states that he had chest pain a few days ago but denies any symptoms now.  On exam he is relatively well-appearing, his vital signs are normal, and the remainder of the  exam is unremarkable.  EKG shows no ischemic changes.  I reviewed the CT imaging from today which confirms PE.  Given the patient's age and comorbidities and the relatively large size and bilateral nature of the PE, I feel that he requires admission for further work-up including possible echocardiogram, and initiation of anticoagulation.  Even though he is hemodynamically stable at this point, given the CT findings and the unclear onset I do not think he is safe for discharge home at this time.  I had an extensive discussion with the patient and his wife about the results of the work-up and the plan of care, and they expressed agreement.   Lab work-up in the ED has been unremarkable.  I initiated Lovenox and signed the patient out to the hospitalist Dr. Jannifer Franklin.   ____________________________________________   FINAL CLINICAL IMPRESSION(S) / ED DIAGNOSES  Final diagnoses:  Acute pulmonary embolism, unspecified pulmonary embolism type, unspecified whether acute cor pulmonale present ([place])      NEW MEDICATIONS STARTED DURING THIS VISIT:  New Prescriptions   No medications on file     Note:  This document was prepared using Dragon voice recognition software and may include unintentional dictation errors.    Arta Silence, MD 12/25/17 [phone]

## 2017-12-25 NOTE — ED Triage Notes (Signed)
Pt reports since February after having teeth removed has been having swelling to neck; increased swelling noted in Sept and lab CXR and labwork which was suspicious for lymphoma; referred to Dr Pryor Ochoa on Friday had CT scan today (neck/chest/pelvis) to r/o lymphoma (PCP Dr Hall Busing); told by office to come to ED since CT indicated lymphoma and bilat PE(s); pt denies c/o at present

## 2017-12-25 NOTE — Progress Notes (Signed)
ANTICOAGULATION CONSULT NOTE - Initial Consult  Pharmacy Consult for full dose lovenox Indication: pulmonary embolus  Allergies  Allergen Reactions  . Penicillins Anaphylaxis and Other (See Comments)    Has patient had a PCN reaction causing immediate rash, facial/tongue/throat swelling, SOB or lightheadedness with hypotension: Unknown Has patient had a PCN reaction causing severe rash involving mucus membranes or skin necrosis: Unknown Has patient had a PCN reaction that required hospitalization: Unknown Has patient had a PCN reaction occurring within the last 10 years: No If all of the above answers are "NO", then may proceed with Cephalosporin use.     Patient Measurements: Height: 6\' 1"  (185.4 cm) Weight: 239 lb (108.4 kg) IBW/kg (Calculated) : 79.9  Vital Signs: Temp: 98.1 F (36.7 C) (10/22 2006) Temp Source: Oral (10/22 2006) BP: 123/64 (10/22 2230) Pulse Rate: 95 (10/22 2006)  Labs: Recent Labs    12/25/17 1106 12/25/17 2029  HGB  --  12.8*  HCT  --  39.6  PLT  --  294  LABPROT  --  12.5  INR  --  0.94  CREATININE 1.50* 1.59*  TROPONINI  --  <0.03    Estimated Creatinine Clearance: 59.8 mL/min (A) (by C-G formula based on SCr of 1.59 mg/dL (H)).   Medical History: Past Medical History:  Diagnosis Date  . Anxiety   . Arthritis   . 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 HOH (hard of hearing)   . Hypertension   . 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 Seasonal allergies   . Type II diabetes mellitus (HCC)    diet controlled    Medications:  Scheduled:  . enoxaparin (LOVENOX) injection  1 mg/kg Subcutaneous Q12H    Assessment: Patient admitted for neck swelling and lymphadenopathy and was seen by ENT to r/o lymphoma and told to come in by MD office after CT showed bilateral PE's.  Patient is not on any anticoagulation PTA. Baseline labs drawn and WNL, aPTT still pending. Patient received a dose of lovenox 1 mg/kg subq x 1 (110  mg).  Goal of Therapy:  Monitor platelets by anticoagulation protocol: Yes   Plan:  Patient has received enoxaparin 110 mg subq x 1 in ED @ 2130. Orders placed to continue enoxaparin 110 mg subq q12h, dose appropriate per renal function. Will monitor daily CBC's and renal function while on lovenox therapy.  Tobie Lords, PharmD, BCPS Clinical Pharmacist 12/25/2017

## 2017-12-25 NOTE — ED Notes (Signed)
Pharmacy called.  Lovenox 110mg  requested.

## 2017-12-25 NOTE — H&P (Signed)
[facility] NAME: [name]    MR#:  [id number]  DATE OF BIRTH:  09-26-1950  DATE OF ADMISSION:  12/25/2017  PRIMARY CARE PHYSICIAN: [name], MD   REQUESTING/REFERRING PHYSICIAN: Cherylann Banas, MD  CHIEF COMPLAINT:   Chief Complaint  Patient presents with  . Lymphadenopathy    HISTORY OF PRESENT ILLNESS:  [name]  is a 66 y.o. male who presents with chief complaint as above.  Patient presents to the ED tonight after being contacted by ENT physician and told he had bilateral pulmonary emboli on recent CT chest.  Reportedly the patient began developing neck swelling after a dental procedure about 8 months ago.  It has progressed throughout the year, and he was finally referred to ENT for evaluation.  ENT ordered CT scans of his neck, chest, abdomen and pelvis.  He was found to have diffuse lymphadenopathy consistent with lymphoma.  He was also found at that time to have bilateral pulmonary emboli he is not been short of breath or hypoxic.  Patient and wife state that he had one episode of chest pain within the last week or so, that was short-lived and not severe.  Hospitalist were called for admission  PAST MEDICAL HISTORY:   Past Medical History:  Diagnosis Date  . Anxiety   . Arthritis   . 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 HOH (hard of hearing)   . Hypertension   . 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 Seasonal allergies   . Type II diabetes mellitus ([place])    diet controlled     PAST SURGICAL HISTORY:   Past Surgical History:  Procedure Laterality Date  . AMPUTATION  06/08/2011   Procedure: AMPUTATION RAY;  Surgeon: Wylene Simmer, MD;  Location: [facility];  Service: Orthopedics;  Laterality: Right;  [place] Amputation  . [place]   "crushed"  . FRACTURE SURGERY    . 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 PILONIDAL CYST / SINUS EXCISION  1970's  . TOE  AMPUTATION  06/08/11   partial; right great toe     SOCIAL HISTORY:   Social History   Tobacco Use  . Smoking status: Former Smoker    Packs/day: 1.50    Years: 22.00    Pack years: 33.00    Types: Cigarettes    Last attempt to quit: 05/24/2011    Years since quitting: 6.5  . Smokeless tobacco: Former Systems developer    Types: Chew    Quit date: 03/07/1983  Substance Use Topics  . Alcohol use: Yes    Comment: 06/08/11 "no liquor for 2 1/2 years; last beer 05/31/11"     FAMILY HISTORY:   Family History  Problem Relation Age of Onset  . Prostate cancer Father   . Hypertension Father   . Other Mother        VARICOSE VEINS     DRUG ALLERGIES:   Allergies  Allergen Reactions  . Penicillins Anaphylaxis and Other (See Comments)    Has patient had a PCN reaction causing immediate rash, facial/tongue/throat swelling, SOB or lightheadedness with hypotension: Unknown Has patient had a PCN reaction causing severe rash involving mucus membranes or skin necrosis: Unknown Has patient had a PCN reaction that required hospitalization: Unknown Has patient had a PCN reaction occurring within the last 10 years: No If all of the  above answers are "NO", then may proceed with Cephalosporin use.     MEDICATIONS AT HOME:   Prior to Admission medications   Medication Sig Start Date End Date Taking? Authorizing Provider  aspirin EC 81 MG tablet Take 81 mg by mouth daily.    [provider]  fish oil-omega-3 fatty acids 1000 MG capsule Take 1 g by mouth daily.    [provider]  fluticasone (FLONASE) 50 MCG/ACT nasal spray Place 2 sprays into the nose daily. 08/04/11 08/03/12  Nils Pyle, MD  hydrochlorothiazide (MICROZIDE) 12.5 MG capsule Take 12.5 mg by mouth daily. 10/19/17   [provider]  JANUVIA 100 MG tablet Take 100 mg by mouth daily. 10/12/17   [provider]  lisinopril (PRINIVIL,ZESTRIL) 40 MG tablet Take 40 mg by mouth daily. 11/10/17   [provider]  metFORMIN (GLUCOPHAGE) 500 MG tablet Take 1,000 mg by mouth 2 (two) times daily. 11/10/17   [provider]  pravastatin (PRAVACHOL) 40 MG tablet Take 40 mg by mouth daily. 11/10/17   [provider]    REVIEW OF SYSTEMS:  Review of Systems  Constitutional: Negative for chills, fever, malaise/fatigue and weight loss.  HENT: Negative for ear pain, hearing loss and tinnitus.        Neck swelling  Eyes: Negative for blurred vision, double vision, pain and redness.  Respiratory: Negative for cough, hemoptysis and shortness of breath.   Cardiovascular: Negative for chest pain, palpitations, orthopnea and leg swelling.  Gastrointestinal: Negative for abdominal pain, constipation, diarrhea, nausea and vomiting.  Genitourinary: Negative for dysuria, frequency and hematuria.  Musculoskeletal: Negative for back pain, joint pain and neck pain.  Skin:       No acne, rash, or lesions  Neurological: Negative for dizziness, tremors, focal weakness and weakness.  Endo/Heme/Allergies: Negative for polydipsia. Does not bruise/bleed easily.  Psychiatric/Behavioral: Negative for depression. The patient is not nervous/anxious and does not have insomnia.      VITAL SIGNS:   Vitals:   12/25/17 2130 12/25/17 2145 12/25/17 2200 12/25/17 2230  BP: 132/61  (!) 128/91 123/64  Pulse:      Resp: (!) 23 15    Temp:      TempSrc:      SpO2:      Weight:      Height:       Wt Readings from Last 3 Encounters:  12/25/17 108.4 kg  10/23/11 96.2 kg  10/11/11 97.3 kg    PHYSICAL EXAMINATION:  Physical Exam  Vitals reviewed. Constitutional: He is oriented to person, place, and time. He appears well-developed and well-nourished. No distress.  HENT:  Head: Normocephalic and atraumatic.  Mouth/Throat: Oropharynx is clear and moist.  Diffuse nodules in submandibular and supraclavicular regions  Eyes: Pupils are equal, round, and reactive to light. Conjunctivae and EOM are normal.  No scleral icterus.  Neck: Normal range of motion. Neck supple. No JVD present. No thyromegaly present.  Cardiovascular: Normal rate, regular rhythm and intact distal pulses. Exam reveals no gallop and no friction rub.  No murmur heard. Respiratory: Effort normal and breath sounds normal. No respiratory distress. He has no wheezes. He has no rales.  GI: Soft. Bowel sounds are normal. He exhibits no distension. There is no tenderness.  Musculoskeletal: Normal range of motion. He exhibits deformity (Left BKA). He exhibits no edema.  No arthritis, no gout  Lymphadenopathy:    He has no cervical adenopathy.  Neurological: He is alert and oriented to  person, place, and time. No cranial nerve deficit.  No dysarthria, no aphasia  Skin: Skin is warm and dry. No rash noted. No erythema.  Psychiatric: He has a normal mood and affect. His behavior is normal. Judgment and thought content normal.    LABORATORY PANEL:   CBC Recent Labs  Lab 12/25/17 2029  WBC 13.1*  HGB 12.8*  HCT 39.6  PLT 294   ------------------------------------------------------------------------------------------------------------------  Chemistries  Recent Labs  Lab 12/25/17 2029  NA 139  K 4.5  CL 104  CO2 22  GLUCOSE 189*  BUN 37*  CREATININE 1.59*  CALCIUM 9.3   ------------------------------------------------------------------------------------------------------------------  Cardiac Enzymes Recent Labs  Lab 12/25/17 2029  TROPONINI <0.03   ------------------------------------------------------------------------------------------------------------------  RADIOLOGY:  Ct Soft Tissue Neck W Contrast  Result Date: 12/25/2017 CLINICAL DATA:  Bilateral submandibular swelling. EXAM: CT NECK WITH CONTRAST TECHNIQUE: Multidetector CT imaging of the neck was performed using the standard protocol following the bolus administration of intravenous contrast. CONTRAST:  88mL ISOVUE-300 IOPAMIDOL (ISOVUE-300)  INJECTION 61% COMPARISON:  None. FINDINGS: Pharynx and larynx: No thickening of Waldeyer's ring. Salivary glands: Masses in the bilateral parotids are attributed to lymph nodes. The submandibular glands are distorted by submandibular adenopathy. Thyroid: Negative Lymph nodes: 5 bulky and diffuse homogeneously enhancing lymph nodes throughout the bilateral neck, supraclavicular fossa, and axillae. 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 Visualized orbits: Negative Mastoids and visualized paranasal sinuses: Clear Skeleton: Bulky spondylosis and degenerative facet spurring. Upper chest: Negative IMPRESSION: Bulky and generalized cervical and upper thoracic adenopathy that is most consistent with lymphoma. Electronically Signed   By: Monte Fantasia M.D.   On: 12/25/2017 14:23   Ct Chest W Contrast  Result Date: 12/25/2017 CLINICAL DATA:  Bilateral neck and axillary adenopathy EXAM: CT CHEST, ABDOMEN, AND PELVIS WITH CONTRAST TECHNIQUE: Multidetector CT imaging of the chest, abdomen and pelvis was performed following the standard protocol during bolus administration of intravenous contrast. CONTRAST:  55mL ISOVUE-300 IOPAMIDOL (ISOVUE-300) INJECTION 61% COMPARISON:  None. FINDINGS: CT CHEST FINDINGS Cardiovascular: The heart is normal in size. No pericardial effusion. The aorta is normal in caliber. Scattered atherosclerotic calcifications. No dissection. The branch vessels are patent. Dense coronary artery calcifications. Bilateral fairly significant pulmonary emboli are noted. Mediastinum/Nodes: Bulky supraclavicular and axillary lymphadenopathy. Index nodal mass in the left supraclavicular region on image number 1 measures 3.6 cm. Right-sided supraclavicular node on image number 1 measures 2.6 cm. Index right axillary lymph node on image number 13 measures 4.4 cm. Index node in the left axilla on image number 13 measures 4.7 cm. Right paratracheal lymph  node on image number 20 measures 16 mm and left paratracheal/AP window node on image number 22 measures 19 mm. The esophagus is grossly normal. Lungs/Pleura: No acute pulmonary findings. No worrisome pulmonary lesions. No pleural effusions or pleural nodules. Musculoskeletal: No significant bony findings. Moderate degenerative changes involving the spine. CT ABDOMEN PELVIS FINDINGS Hepatobiliary: No focal hepatic lesions or intrahepatic biliary dilatation. The gallbladder is normal. Pancreas: No mass, inflammation or ductal dilatation. Spleen: Moderate splenomegaly. The spleen measures 15.5 x 15.0 x 11.5 cm. No focal lesions. Adrenals/Urinary Tract: The adrenal glands and kidneys are unremarkable. Stomach/Bowel: The stomach, duodenum, small bowel and colon are grossly normal. No acute inflammatory changes, mass lesions or obstructive findings. The terminal ileum and appendix are normal. Vascular/Lymphatic: Extensive lymphadenopathy in the abdomen and pelvis. Cluster of retroperitoneal lymph nodes on image number 78 measures 10.6 x 5.4 cm and surrounds the aorta.  3.3 cm of celiac axis lymph node on image number 61. Numerous mesenteric lymph nodes. Bulky left periaortic nodal mass continues down into the pelvis. Index node on image number 95 measures 5 cm. Extensive pelvic adenopathy. Bulky operator region lymph nodes have significant mass effect in the midline on the sigmoid colon and especially the bladder which is markedly compressed. The right nodal mass measures 6.7 cm and the left nodal mass measures 6.7 cm. Extensive inguinal lymphadenopathy. Index node on the left measures 4.9 cm on image number 122 and index node on the right measures 3.9 cm on image number 126. Extensive atherosclerotic calcifications involving the aorta and iliac arteries. Reproductive: The prostate gland and seminal vesicles are grossly normal. Other: No free pelvic fluid collections. Musculoskeletal: No significant bony findings.  IMPRESSION: 1. Fairly significant bilateral pulmonary emboli. 2. Bulky supraclavicular, axillary, mediastinal, mesenteric, retroperitoneal, pelvic and inguinal lymphadenopathy most consistent with lymphoma. 3. Splenomegaly without focal splenic lesions. 4. Bulky pelvic adenopathy is compressing the bladder and sigmoid colon. These results were called by telephone at the time of interpretation on 12/25/2017 at 5:00 pm to Dr. Carloyn Manner , who verbally acknowledged these results. Electronically Signed   By: Marijo Sanes M.D.   On: 12/25/2017 17:01   Ct Abdomen Pelvis W Contrast  Result Date: 12/25/2017 CLINICAL DATA:  Bilateral neck and axillary adenopathy EXAM: CT CHEST, ABDOMEN, AND PELVIS WITH CONTRAST TECHNIQUE: Multidetector CT imaging of the chest, abdomen and pelvis was performed following the standard protocol during bolus administration of intravenous contrast. CONTRAST:  1mL ISOVUE-300 IOPAMIDOL (ISOVUE-300) INJECTION 61% COMPARISON:  None. FINDINGS: CT CHEST FINDINGS Cardiovascular: The heart is normal in size. No pericardial effusion. The aorta is normal in caliber. Scattered atherosclerotic calcifications. No dissection. The branch vessels are patent. Dense coronary artery calcifications. Bilateral fairly significant pulmonary emboli are noted. Mediastinum/Nodes: Bulky supraclavicular and axillary lymphadenopathy. Index nodal mass in the left supraclavicular region on image number 1 measures 3.6 cm. Right-sided supraclavicular node on image number 1 measures 2.6 cm. Index right axillary lymph node on image number 13 measures 4.4 cm. Index node in the left axilla on image number 13 measures 4.7 cm. Right paratracheal lymph node on image number 20 measures 16 mm and left paratracheal/AP window node on image number 22 measures 19 mm. The esophagus is grossly normal. Lungs/Pleura: No acute pulmonary findings. No worrisome pulmonary lesions. No pleural effusions or pleural nodules. Musculoskeletal:  No significant bony findings. Moderate degenerative changes involving the spine. CT ABDOMEN PELVIS FINDINGS Hepatobiliary: No focal hepatic lesions or intrahepatic biliary dilatation. The gallbladder is normal. Pancreas: No mass, inflammation or ductal dilatation. Spleen: Moderate splenomegaly. The spleen measures 15.5 x 15.0 x 11.5 cm. No focal lesions. Adrenals/Urinary Tract: The adrenal glands and kidneys are unremarkable. Stomach/Bowel: The stomach, duodenum, small bowel and colon are grossly normal. No acute inflammatory changes, mass lesions or obstructive findings. The terminal ileum and appendix are normal. Vascular/Lymphatic: Extensive lymphadenopathy in the abdomen and pelvis. Cluster of retroperitoneal lymph nodes on image number 78 measures 10.6 x 5.4 cm and surrounds the aorta. 3.3 cm of celiac axis lymph node on image number 61. Numerous mesenteric lymph nodes. Bulky left periaortic nodal mass continues down into the pelvis. Index node on image number 95 measures 5 cm. Extensive pelvic adenopathy. Bulky operator region lymph nodes have significant mass effect in the midline on the sigmoid colon and especially the bladder which is markedly compressed. The right nodal mass measures 6.7 cm and the left nodal mass  measures 6.7 cm. Extensive inguinal lymphadenopathy. Index node on the left measures 4.9 cm on image number 122 and index node on the right measures 3.9 cm on image number 126. Extensive atherosclerotic calcifications involving the aorta and iliac arteries. Reproductive: The prostate gland and seminal vesicles are grossly normal. Other: No free pelvic fluid collections. Musculoskeletal: No significant bony findings. IMPRESSION: 1. Fairly significant bilateral pulmonary emboli. 2. Bulky supraclavicular, axillary, mediastinal, mesenteric, retroperitoneal, pelvic and inguinal lymphadenopathy most consistent with lymphoma. 3. Splenomegaly without focal splenic lesions. 4. Bulky pelvic adenopathy is  compressing the bladder and sigmoid colon. These results were called by telephone at the time of interpretation on 12/25/2017 at 5:00 pm to Dr. Carloyn Manner , who verbally acknowledged these results. Electronically Signed   By: Marijo Sanes M.D.   On: 12/25/2017 17:01    EKG:   Orders placed or performed during the hospital encounter of 12/25/17  . EKG 12-Lead  . EKG 12-Lead    IMPRESSION AND PLAN:  Principal Problem:   Bilateral pulmonary embolism (HCC) -therapeutic dose anticoagulation with Lovenox, echocardiogram ordered Active Problems:   Thoracic lymphadenopathy -consistent with lymphoma, oncology consult as well as ENT consult   Diabetes mellitus, type 2 (HCC) -sliding scale insulin with corresponding glucose checks   Essential hypertension -continue home medications   PAD (peripheral artery disease) ([place]) -continue home meds, anticoagulation as above   HLD (hyperlipidemia) -Home dose antilipid   CKD (chronic kidney disease), stage III ([place]) -at baseline, avoid nephrotoxins and monitor  Chart review performed and case discussed with ED provider. Labs, imaging and/or ECG reviewed by provider and discussed with patient/family. Management plans discussed with the patient and/or family.  DVT PROPHYLAXIS: Systemic anticoagulation  GI PROPHYLAXIS:  None  ADMISSION STATUS: Inpatient     CODE STATUS: Full Code Status History    Date Active Date Inactive Code Status Order ID Comments User Context   06/08/2011 1642 06/12/2011 1822 Full Code [id number]  Scarlette Calico, RN Inpatient      TOTAL TIME TAKING CARE OF THIS PATIENT: 45 minutes.   Jannifer Franklin, Yafet Cline [place] 12/25/2017, 11:29 PM  [facility]  Office  [phone]  CC: Primary care physician; [name], MD  Note:  This document was prepared using Dragon voice recognition software and may include unintentional dictation errors.

## 2017-12-26 ENCOUNTER — Ambulatory Visit
Admission: RE | Admit: 2017-12-26 | Discharge: 2017-12-26 | Disposition: A | Discharge status: Home / Self Care | Payer: Federal, State, Local not specified - PPO | Source: Ambulatory Visit | Attending: Otolaryngology | Admitting: Otolaryngology

## 2017-12-26 ENCOUNTER — Inpatient Hospital Stay: Payer: Medicare Other

## 2017-12-26 ENCOUNTER — Other Ambulatory Visit: Payer: Self-pay

## 2017-12-26 ENCOUNTER — Inpatient Hospital Stay (HOSPITAL_COMMUNITY)
Admit: 2017-12-26 | Discharge: 2017-12-26 | Disposition: A | Discharge status: Home / Self Care | Payer: Medicare Other | Attending: Internal Medicine | Admitting: Internal Medicine

## 2017-12-26 DIAGNOSIS — I2699 Other pulmonary embolism without acute cor pulmonale: Principal | ICD-10-CM

## 2017-12-26 DIAGNOSIS — Z87891 Personal history of nicotine dependence: Secondary | ICD-10-CM

## 2017-12-26 DIAGNOSIS — R590 Localized enlarged lymph nodes: Secondary | ICD-10-CM

## 2017-12-26 DIAGNOSIS — I5030 Unspecified diastolic (congestive) heart failure: Secondary | ICD-10-CM

## 2017-12-26 LAB — BASIC METABOLIC PANEL
Anion gap: 9 (ref 5–15)
BUN: 40 mg/dL — ABNORMAL HIGH (ref 8–23)
CO2: 27 mmol/L (ref 22–32)
Calcium: 8.8 mg/dL — ABNORMAL LOW (ref 8.9–10.3)
Chloride: 107 mmol/L (ref 98–111)
Creatinine, Ser: 1.57 mg/dL — ABNORMAL HIGH (ref 0.61–1.24)
GFR calc Af Amer: 52 mL/min — ABNORMAL LOW (ref >60)
GFR calc non Af Amer: 45 mL/min — ABNORMAL LOW (ref >60)
Glucose, Bld: 143 mg/dL — ABNORMAL HIGH (ref 70–99)
Potassium: 4.1 mmol/L (ref 3.5–5.1)
Sodium: 143 mmol/L (ref 135–145)

## 2017-12-26 LAB — URIC ACID: Uric Acid, Serum: 10.6 mg/dL — ABNORMAL HIGH (ref 3.7–8.6)

## 2017-12-26 LAB — CBC
HCT: 38.4 % — ABNORMAL LOW (ref 39.0–52.0)
Hemoglobin: 11.9 g/dL — ABNORMAL LOW (ref 13.0–17.0)
MCH: 26.8 pg (ref 26.0–34.0)
MCHC: 31 g/dL (ref 30.0–36.0)
MCV: 86.5 fL (ref 80.0–100.0)
Platelets: 263 10*3/uL (ref 150–400)
RBC: 4.44 MIL/uL (ref 4.22–5.81)
RDW: 13.6 % (ref 11.5–15.5)
WBC: 11.8 10*3/uL — ABNORMAL HIGH (ref 4.0–10.5)
nRBC: 0 % (ref 0.0–0.2)

## 2017-12-26 LAB — HEPATIC FUNCTION PANEL
ALT: 14 U/L (ref 0–44)
AST: 17 U/L (ref 15–41)
Albumin: 4.2 g/dL (ref 3.5–5.0)
Alkaline Phosphatase: 63 U/L (ref 38–126)
Bilirubin, Direct: 0.1 mg/dL (ref 0.0–0.2)
Indirect Bilirubin: 0.5 mg/dL (ref 0.3–0.9)
Total Bilirubin: 0.6 mg/dL (ref 0.3–1.2)
Total Protein: 6.7 g/dL (ref 6.5–8.1)

## 2017-12-26 LAB — GLUCOSE, CAPILLARY
Glucose-Capillary: 148 mg/dL — ABNORMAL HIGH (ref 70–99)
Glucose-Capillary: 150 mg/dL — ABNORMAL HIGH (ref 70–99)
Glucose-Capillary: 156 mg/dL — ABNORMAL HIGH (ref 70–99)
Glucose-Capillary: 157 mg/dL — ABNORMAL HIGH (ref 70–99)
Glucose-Capillary: 158 mg/dL — ABNORMAL HIGH (ref 70–99)
Glucose-Capillary: 194 mg/dL — ABNORMAL HIGH (ref 70–99)

## 2017-12-26 LAB — ECHOCARDIOGRAM COMPLETE
Height: 73 in
Weight: 3824 oz

## 2017-12-26 LAB — HEPARIN LEVEL (UNFRACTIONATED): Heparin Unfractionated: 0.46 IU/mL (ref 0.30–0.70)

## 2017-12-26 LAB — LACTATE DEHYDROGENASE: LDH: 263 U/L — ABNORMAL HIGH (ref 98–192)

## 2017-12-26 LAB — APTT: aPTT: 30 seconds (ref 24–36)

## 2017-12-26 MED ORDER — ONDANSETRON HCL 4 MG PO TABS: 4.0000 mg | ORAL_TABLET | Freq: Four times a day (QID) | ORAL | Status: DC | PRN

## 2017-12-26 MED ORDER — MIDAZOLAM HCL 5 MG/5ML IJ SOLN
INTRAMUSCULAR | Status: AC
  Filled 2017-12-26: qty 5

## 2017-12-26 MED ORDER — OXYCODONE HCL 5 MG PO TABS: 5.0000 mg | ORAL_TABLET | ORAL | Status: DC | PRN

## 2017-12-26 MED ORDER — PRAVASTATIN SODIUM 40 MG PO TABS
40.0000 mg | ORAL_TABLET | Freq: Every day | ORAL | Status: DC
  Administered 2017-12-26: 40 mg via ORAL
  Filled 2017-12-26: qty 2
  Filled 2017-12-26 (×2): qty 1

## 2017-12-26 MED ORDER — HEPARIN (PORCINE) IN NACL 100-0.45 UNIT/ML-% IJ SOLN
1750.0000 [IU]/h | INTRAMUSCULAR | Status: DC
  Administered 2017-12-26 – 2017-12-27 (×3): 1750 [IU]/h via INTRAVENOUS
  Filled 2017-12-26 (×3): qty 250

## 2017-12-26 MED ORDER — SODIUM CHLORIDE 0.9 % IV SOLN: INTRAVENOUS | Status: DC

## 2017-12-26 MED ORDER — ACETAMINOPHEN 650 MG RE SUPP: 650.0000 mg | Freq: Four times a day (QID) | RECTAL | Status: DC | PRN

## 2017-12-26 MED ORDER — FENTANYL CITRATE (PF) 100 MCG/2ML IJ SOLN
INTRAMUSCULAR | Status: AC | PRN
  Administered 2017-12-26: 50 ug via INTRAVENOUS

## 2017-12-26 MED ORDER — FENTANYL CITRATE (PF) 100 MCG/2ML IJ SOLN
INTRAMUSCULAR | Status: AC
  Filled 2017-12-26: qty 4

## 2017-12-26 MED ORDER — ASPIRIN EC 81 MG PO TBEC
81.0000 mg | DELAYED_RELEASE_TABLET | Freq: Every day | ORAL | Status: DC
  Administered 2017-12-27: 81 mg via ORAL
  Filled 2017-12-26 (×2): qty 1

## 2017-12-26 MED ORDER — MIDAZOLAM HCL 5 MG/5ML IJ SOLN
INTRAMUSCULAR | Status: AC | PRN
  Administered 2017-12-26: 1 mg via INTRAVENOUS

## 2017-12-26 MED ORDER — HYDROCODONE-ACETAMINOPHEN 5-325 MG PO TABS: 1.0000 | ORAL_TABLET | ORAL | Status: DC | PRN

## 2017-12-26 MED ORDER — INSULIN ASPART 100 UNIT/ML ~~LOC~~ SOLN
0.0000 [IU] | Freq: Four times a day (QID) | SUBCUTANEOUS | Status: DC
  Administered 2017-12-26: 1 [IU] via SUBCUTANEOUS
  Administered 2017-12-26 – 2017-12-27 (×2): 2 [IU] via SUBCUTANEOUS
  Administered 2017-12-27: 12:00:00 3 [IU] via SUBCUTANEOUS
  Administered 2017-12-27: 1 [IU] via SUBCUTANEOUS
  Filled 2017-12-26 (×5): qty 1

## 2017-12-26 MED ORDER — ACETAMINOPHEN 325 MG PO TABS: 650.0000 mg | ORAL_TABLET | Freq: Four times a day (QID) | ORAL | Status: DC | PRN

## 2017-12-26 MED ORDER — ONDANSETRON HCL 4 MG/2ML IJ SOLN: 4.0000 mg | Freq: Four times a day (QID) | INTRAMUSCULAR | Status: DC | PRN

## 2017-12-26 MED ORDER — SODIUM CHLORIDE 0.9 % IV SOLN
INTRAVENOUS | Status: AC | PRN
  Administered 2017-12-26: 10 mL/h via INTRAVENOUS

## 2017-12-26 NOTE — Consult Note (Signed)
[facility] NOTE  Patient Care Team: [name], MD as PCP - General (Internal Medicine)  CHIEF COMPLAINTS/PURPOSE OF CONSULTATION:  Lymphoma/bulky adenopathy; acute PE.  HISTORY OF PRESENTING ILLNESS: History is somewhat limited given patient's difficulty hearing [name] 67 y.o.  male with no prior history of any malignancy currently admitted to the hospital for worsening shortness of breath/and also given significant adenopathy.  Patient states that he noted to have worsening lymph nodes in underarm neck in the last few months.  He denies any significant pain.  Does not eat or night sweats poor appetite possible weight loss.   Patient was initially referred to ENT; however given worsening shortness of breath he had a CT of the chest that showed acute PE; neck abdomen pelvis CT scan showed-bilateral bulky neck axillary and inguinal adenopathy; retroperitoneal bulky adenopathy.   Patient is currently on Lovenox; he is not getting any oxygen supplementation.   Review of Systems  Constitutional: Positive for diaphoresis, malaise/fatigue and weight loss. Negative for chills and fever.  HENT: Negative for nosebleeds and sore throat.   Eyes: Negative for double vision.  Respiratory: Positive for shortness of breath. Negative for cough, hemoptysis and wheezing.   Cardiovascular: Negative for chest pain, palpitations, orthopnea and leg swelling.  Gastrointestinal: Negative for abdominal pain, blood in stool, constipation, diarrhea, heartburn, melena, nausea and vomiting.  Genitourinary: Negative for dysuria, frequency and urgency.  Musculoskeletal: Negative for back pain and joint pain.  Skin: Negative.  Negative for itching and rash.  Neurological: Negative for dizziness, tingling, focal weakness, weakness and headaches.  Endo/Heme/Allergies: Does not bruise/bleed easily.  Psychiatric/Behavioral: Negative for depression. The patient is not nervous/anxious  and does not have insomnia.      MEDICAL HISTORY:  Past Medical History:  Diagnosis Date  . Anxiety   . Arthritis   . 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 HOH (hard of hearing)   . Hypertension   . 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 Seasonal allergies   . Type II diabetes mellitus ([facility])    diet controlled    SURGICAL HISTORY: Past Surgical History:  Procedure Laterality Date  . AMPUTATION  06/08/2011   Procedure: AMPUTATION RAY;  Surgeon: Wylene Simmer, MD;  Location: [facility];  Service: Orthopedics;  Laterality: Right;  R[place]ux Amputation  . [place]   "crushed"  . FRACTURE SURGERY    . 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 PILONIDAL CYST / SINUS EXCISION  1970's  . TOE AMPUTATION  06/08/11   partial; right great toe    SOCIAL HISTORY: Quit smoking many years ago.  No alcohol abuse.  Patient lives at home; runs his own business Social History   Socioeconomic History  . Marital status: Married    Spouse name: Not on file  . Number of children: Not on file  . Years of education: Not on file  . Highest education level: Not on file  Occupational History  . Not on file  Social Needs  . Financial resource strain: Not on file  . Food insecurity:    Worry: Not on file    Inability: Not on file  . Transportation needs:    Medical: Not on file    Non-medical: Not on file  Tobacco Use  . Smoking status: Former Smoker    Packs/day: 1.50    Years: 22.00  Pack years: 33.00    Types: Cigarettes    Last attempt to quit: 05/24/2011    Years since quitting: 6.5  . Smokeless tobacco: Former Systems developer    Types: [place] date: 03/07/1983  Substance and Sexual Activity  . Alcohol use: Yes    Comment: 06/08/11 "no liquor for 2 1/2 years; last beer 05/31/11"  . Drug use: No  . Sexual activity: Yes  Lifestyle  . Physical activity:    Days per week: Not on file    Minutes per session: Not on file  . Stress: Not on file   Relationships  . Social connections:    Talks on phone: Not on file    Gets together: Not on file    Attends religious service: Not on file    Active member of club or organization: Not on file    Attends meetings of clubs or organizations: Not on file    Relationship status: Not on file  . Intimate partner violence:    Fear of current or ex partner: Not on file    Emotionally abused: Not on file    Physically abused: Not on file    Forced sexual activity: Not on file  Other Topics Concern  . Not on file  Social History Narrative   Married    FAMILY HISTORY: Family History  Problem Relation Age of Onset  . Prostate cancer Father   . Hypertension Father   . Other Mother        VARICOSE VEINS    ALLERGIES:  is allergic to penicillins.  MEDICATIONS:  Current Facility-Administered Medications  Medication Dose Route Frequency Provider Last Rate Last Dose  . 0.9 %  sodium chloride infusion   Intravenous Continuous Arne Cleveland, MD      . acetaminophen (TYLENOL) tablet 650 mg  650 mg Oral Q6H PRN Lance Coon, MD       Or  . acetaminophen (TYLENOL) suppository 650 mg  650 mg Rectal Q6H PRN Lance Coon, MD      . aspirin EC tablet 81 mg  81 mg Oral Daily Lance Coon, MD   Stopped at 12/26/17 [phone]  . heparin ADULT infusion 100 units/mL (25000 units/218mL sodium chloride 0.45%)  1,750 Units/hr Intravenous Continuous Hallaji, Dani Gobble, RPH 17.5 mL/hr at 12/26/17 0921 1,750 Units/hr at 12/26/17 0921  . insulin aspart (novoLOG) injection 0-9 Units  0-9 Units Subcutaneous Q6H Lance Coon, MD      . ondansetron [facility]) tablet 4 mg  4 mg Oral Q6H PRN Lance Coon, MD       Or  . ondansetron [facility]) injection 4 mg  4 mg Intravenous Q6H PRN Lance Coon, MD      . oxyCODONE (Oxy IR/ROXICODONE) immediate release tablet 5 mg  5 mg Oral Q4H PRN Lance Coon, MD      . pravastatin (PRAVACHOL) tablet 40 mg  40 mg Oral Daily Lance Coon, MD          .  PHYSICAL  EXAMINATION:  Vitals:   12/26/17 0050 12/26/17 0416  BP: (!) 142/74 (!) 146/77  Pulse: 89 89  Resp: 17 18  Temp: 98 F (36.7 C) 98.2 F (36.8 C)  SpO2: 97% 97%   Filed Weights   12/25/17 2007  Weight: 239 lb (108.4 kg)    GENERAL: Well-nourished well-developed; Alert, no distress and comfortable.  Sleepy. EYES: no pallor or icterus OROPHARYNX: no thrush or ulceration. NECK: supple, no masses felt LYMPH: Significant bulky palpable  lymphadenopathy in the cervical, axillary or inguinal regions LUNGS: decreased breath sounds to auscultation at bases and  No wheeze or crackles HEART/CVS: regular rate & rhythm and no murmurs; No lower extremity edema ABDOMEN: abdomen soft, non-tender and normal bowel sounds Musculoskeletal:no cyanosis of digits and no clubbing  PSYCH: alert & oriented x 3 with fluent speech NEURO: no focal motor/sensory deficits SKIN:  no rashes or significant lesions  LABORATORY DATA:  I have reviewed the data as listed Lab Results  Component Value Date   WBC 11.8 (H) 12/26/2017   HGB 11.9 (L) 12/26/2017   HCT 38.4 (L) 12/26/2017   MCV 86.5 12/26/2017   PLT 263 12/26/2017   Recent Labs    12/25/17 1106 12/25/17 2029 12/26/17 0628 12/26/17 1000  NA  --  139 143  --   K  --  4.5 4.1  --   CL  --  104 107  --   CO2  --  22 27  --   GLUCOSE  --  189* 143*  --   BUN  --  37* 40*  --   CREATININE 1.50* 1.59* 1.57*  --   CALCIUM  --  9.3 8.8*  --   GFRNONAA  --  44* 45*  --   GFRAA  --  51* 52*  --   PROT  --   --   --  6.7  ALBUMIN  --   --   --  4.2  AST  --   --   --  17  ALT  --   --   --  14  ALKPHOS  --   --   --  63  BILITOT  --   --   --  0.6  BILIDIR  --   --   --  0.1  IBILI  --   --   --  0.5    RADIOGRAPHIC STUDIES: I have personally reviewed the radiological images as listed and agreed with the findings in the report. Ct Soft Tissue Neck W Contrast  Result Date: 12/25/2017 CLINICAL DATA:  Bilateral submandibular swelling. EXAM:  CT NECK WITH CONTRAST TECHNIQUE: Multidetector CT imaging of the neck was performed using the standard protocol following the bolus administration of intravenous contrast. CONTRAST:  35mL ISOVUE-300 IOPAMIDOL (ISOVUE-300) INJECTION 61% COMPARISON:  None. FINDINGS: Pharynx and larynx: No thickening of Waldeyer's ring. Salivary glands: Masses in the bilateral parotids are attributed to lymph nodes. The submandibular glands are distorted by submandibular adenopathy. Thyroid: Negative Lymph nodes: 5 bulky and diffuse homogeneously enhancing lymph nodes throughout the bilateral neck, supraclavicular fossa, and axillae. 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 Visualized orbits: Negative Mastoids and visualized paranasal sinuses: Clear Skeleton: Bulky spondylosis and degenerative facet spurring. Upper chest: Negative IMPRESSION: Bulky and generalized cervical and upper thoracic adenopathy that is most consistent with lymphoma. Electronically Signed   By: Monte Fantasia M.D.   On: 12/25/2017 14:23   Ct Chest W Contrast  Result Date: 12/25/2017 CLINICAL DATA:  Bilateral neck and axillary adenopathy EXAM: CT CHEST, ABDOMEN, AND PELVIS WITH CONTRAST TECHNIQUE: Multidetector CT imaging of the chest, abdomen and pelvis was performed following the standard protocol during bolus administration of intravenous contrast. CONTRAST:  17mL ISOVUE-300 IOPAMIDOL (ISOVUE-300) INJECTION 61% COMPARISON:  None. FINDINGS: CT CHEST FINDINGS Cardiovascular: The heart is normal in size. No pericardial effusion. The aorta is normal in caliber. Scattered atherosclerotic calcifications. No dissection. The branch vessels are patent. Dense coronary  artery calcifications. Bilateral fairly significant pulmonary emboli are noted. Mediastinum/Nodes: Bulky supraclavicular and axillary lymphadenopathy. Index nodal mass in the left supraclavicular region on image number 1 measures 3.6 cm.  Right-sided supraclavicular node on image number 1 measures 2.6 cm. Index right axillary lymph node on image number 13 measures 4.4 cm. Index node in the left axilla on image number 13 measures 4.7 cm. Right paratracheal lymph node on image number 20 measures 16 mm and left paratracheal/AP window node on image number 22 measures 19 mm. The esophagus is grossly normal. Lungs/Pleura: No acute pulmonary findings. No worrisome pulmonary lesions. No pleural effusions or pleural nodules. Musculoskeletal: No significant bony findings. Moderate degenerative changes involving the spine. CT ABDOMEN PELVIS FINDINGS Hepatobiliary: No focal hepatic lesions or intrahepatic biliary dilatation. The gallbladder is normal. Pancreas: No mass, inflammation or ductal dilatation. Spleen: Moderate splenomegaly. The spleen measures 15.5 x 15.0 x 11.5 cm. No focal lesions. Adrenals/Urinary Tract: The adrenal glands and kidneys are unremarkable. Stomach/Bowel: The stomach, duodenum, small bowel and colon are grossly normal. No acute inflammatory changes, mass lesions or obstructive findings. The terminal ileum and appendix are normal. Vascular/Lymphatic: Extensive lymphadenopathy in the abdomen and pelvis. Cluster of retroperitoneal lymph nodes on image number 78 measures 10.6 x 5.4 cm and surrounds the aorta. 3.3 cm of celiac axis lymph node on image number 61. Numerous mesenteric lymph nodes. Bulky left periaortic nodal mass continues down into the pelvis. Index node on image number 95 measures 5 cm. Extensive pelvic adenopathy. Bulky operator region lymph nodes have significant mass effect in the midline on the sigmoid colon and especially the bladder which is markedly compressed. The right nodal mass measures 6.7 cm and the left nodal mass measures 6.7 cm. Extensive inguinal lymphadenopathy. Index node on the left measures 4.9 cm on image number 122 and index node on the right measures 3.9 cm on image number 126. Extensive  atherosclerotic calcifications involving the aorta and iliac arteries. Reproductive: The prostate gland and seminal vesicles are grossly normal. Other: No free pelvic fluid collections. Musculoskeletal: No significant bony findings. IMPRESSION: 1. Fairly significant bilateral pulmonary emboli. 2. Bulky supraclavicular, axillary, mediastinal, mesenteric, retroperitoneal, pelvic and inguinal lymphadenopathy most consistent with lymphoma. 3. Splenomegaly without focal splenic lesions. 4. Bulky pelvic adenopathy is compressing the bladder and sigmoid colon. These results were called by telephone at the time of interpretation on 12/25/2017 at 5:00 pm to Dr. Carloyn Manner , who verbally acknowledged these results. Electronically Signed   By: Marijo Sanes M.D.   On: 12/25/2017 17:01   Ct Abdomen Pelvis W Contrast  Result Date: 12/25/2017 CLINICAL DATA:  Bilateral neck and axillary adenopathy EXAM: CT CHEST, ABDOMEN, AND PELVIS WITH CONTRAST TECHNIQUE: Multidetector CT imaging of the chest, abdomen and pelvis was performed following the standard protocol during bolus administration of intravenous contrast. CONTRAST:  4mL ISOVUE-300 IOPAMIDOL (ISOVUE-300) INJECTION 61% COMPARISON:  None. FINDINGS: CT CHEST FINDINGS Cardiovascular: The heart is normal in size. No pericardial effusion. The aorta is normal in caliber. Scattered atherosclerotic calcifications. No dissection. The branch vessels are patent. Dense coronary artery calcifications. Bilateral fairly significant pulmonary emboli are noted. Mediastinum/Nodes: Bulky supraclavicular and axillary lymphadenopathy. Index nodal mass in the left supraclavicular region on image number 1 measures 3.6 cm. Right-sided supraclavicular node on image number 1 measures 2.6 cm. Index right axillary lymph node on image number 13 measures 4.4 cm. Index node in the left axilla on image number 13 measures 4.7 cm. Right paratracheal lymph node on image number 20  measures 16 mm and  left paratracheal/AP window node on image number 22 measures 19 mm. The esophagus is grossly normal. Lungs/Pleura: No acute pulmonary findings. No worrisome pulmonary lesions. No pleural effusions or pleural nodules. Musculoskeletal: No significant bony findings. Moderate degenerative changes involving the spine. CT ABDOMEN PELVIS FINDINGS Hepatobiliary: No focal hepatic lesions or intrahepatic biliary dilatation. The gallbladder is normal. Pancreas: No mass, inflammation or ductal dilatation. Spleen: Moderate splenomegaly. The spleen measures 15.5 x 15.0 x 11.5 cm. No focal lesions. Adrenals/Urinary Tract: The adrenal glands and kidneys are unremarkable. Stomach/Bowel: The stomach, duodenum, small bowel and colon are grossly normal. No acute inflammatory changes, mass lesions or obstructive findings. The terminal ileum and appendix are normal. Vascular/Lymphatic: Extensive lymphadenopathy in the abdomen and pelvis. Cluster of retroperitoneal lymph nodes on image number 78 measures 10.6 x 5.4 cm and surrounds the aorta. 3.3 cm of celiac axis lymph node on image number 61. Numerous mesenteric lymph nodes. Bulky left periaortic nodal mass continues down into the pelvis. Index node on image number 95 measures 5 cm. Extensive pelvic adenopathy. Bulky operator region lymph nodes have significant mass effect in the midline on the sigmoid colon and especially the bladder which is markedly compressed. The right nodal mass measures 6.7 cm and the left nodal mass measures 6.7 cm. Extensive inguinal lymphadenopathy. Index node on the left measures 4.9 cm on image number 122 and index node on the right measures 3.9 cm on image number 126. Extensive atherosclerotic calcifications involving the aorta and iliac arteries. Reproductive: The prostate gland and seminal vesicles are grossly normal. Other: No free pelvic fluid collections. Musculoskeletal: No significant bony findings. IMPRESSION: 1. Fairly significant bilateral  pulmonary emboli. 2. Bulky supraclavicular, axillary, mediastinal, mesenteric, retroperitoneal, pelvic and inguinal lymphadenopathy most consistent with lymphoma. 3. Splenomegaly without focal splenic lesions. 4. Bulky pelvic adenopathy is compressing the bladder and sigmoid colon. These results were called by telephone at the time of interpretation on 12/25/2017 at 5:00 pm to Dr. Carloyn Manner , who verbally acknowledged these results. Electronically Signed   By: Marijo Sanes M.D.   On: 12/25/2017 17:01    ASSESSMENT & PLAN:   #67 year old male patient admitted the hospital for worsening shortness of breath-noted to have PE; imaging shows multiple enlarged lymph nodes  #Multiple enlarged lymph nodes-bulky adenopathy; highly suspicious for lymphoma.  Suspicious of non-Hodgkin's lymphoma/indolent. Recommend core biopsy [as patient is currently on anticoagulation for his acute PE-see below]; discussed with Dr. Vernard Gambles from interventional radiology.  Biopsy planned for this afternoon.  Also check LDH; beta-2 microglobulin; serum electrophoresis; hepatitis panel-patient also need a 2D echo prior to discharge.  #Bilateral PE acute-currently on Lovenox; switch over to IV heparin in anticipation of core biopsy this afternoon.  Heparin needs to be stopped 2 to 3 hours prior biopsy.  And could be resumed thereafter.  Given the concerns for lymphoma/as a possible cause of his acute PE -I would recommend Lovenox 1 mg/kg twice daily dosing at discharge.  Patient will also need bilateral lower extremity venous Dopplers.  Thank you Dr.Patel for allowing me to participate in the care of your pleasant patient. Please do not hesitate to contact me with questions or concerns in the interim.  Discussed with Dr. Posey Pronto.  # I reviewed the blood work- with the patient in detail; also reviewed the imaging independently [as summarized above]; and with the patient in detail. All questions were answered. The patient knows to  call the clinic with any problems, questions or concerns.  Cammie Sickle, MD 12/26/2017 11:55 AM

## 2017-12-26 NOTE — Progress Notes (Signed)
[facility] at [facility]                                                                                                                                                                                  Patient Demographics   [name], is a 67 y.o. male, DOB - 18-Aug-1950, NWG:[id number]  Admit date - 12/25/2017   Admitting Physician Lance Coon, MD  Outpatient Primary MD for the patient is [name], MD   LOS - 1  Subjective: Patient very anxious to have his biopsy done he wants to eat    Review of Systems:   CONSTITUTIONAL: No documented fever. No fatigue, weakness. No weight gain, no weight loss.  EYES: No blurry or double vision.  ENT: No tinnitus. No postnasal drip. No redness of the oropharynx.  RESPIRATORY: No cough, no wheeze, no hemoptysis. No dyspnea.  CARDIOVASCULAR: No chest pain. No orthopnea. No palpitations. No syncope.  GASTROINTESTINAL: No nausea, no vomiting or diarrhea. No abdominal pain. No melena or hematochezia.  GENITOURINARY: No dysuria or hematuria.  ENDOCRINE: No polyuria or nocturia. No heat or cold intolerance.  HEMATOLOGY: No anemia. No bruising. No bleeding.  INTEGUMENTARY: No rashes. No lesions.  MUSCULOSKELETAL: No arthritis. No swelling. No gout.  NEUROLOGIC: No numbness, tingling, or ataxia. No seizure-type activity.  PSYCHIATRIC: No anxiety. No insomnia. No ADD.    Vitals:   Vitals:   12/26/17 1340 12/26/17 1340 12/26/17 1350 12/26/17 1412  BP:  120/67 122/66 (!) 111/59  Pulse: 82  80 79  Resp: 12  14 16   Temp:    (!) 97.3 F (36.3 C)  TempSrc:    Oral  SpO2: 96%  98% 96%  Weight:      Height:        Wt Readings from Last 3 Encounters:  12/25/17 108.4 kg  10/23/11 96.2 kg  10/11/11 97.3 kg    No intake or output data in the 24 hours ending 12/26/17 1435  Physical Exam:   GENERAL: Pleasant-appearing in no apparent distress.  HEAD, EYES, EARS, NOSE AND THROAT: Atraumatic, normocephalic.  Extraocular muscles are intact. Pupils equal and reactive to light. Sclerae anicteric. No conjunctival injection. No oro-pharyngeal erythema.  NECK: Supple. There is no jugular venous distention. No bruits, no lymphadenopathy, no thyromegaly.  HEART: Regular rate and rhythm,. No murmurs, no rubs, no clicks.  LUNGS: Clear to auscultation bilaterally. No rales or rhonchi. No wheezes.  ABDOMEN: Soft, flat, nontender, nondistended. Has good bowel sounds. No hepatosplenomegaly appreciated.  EXTREMITIES: No evidence of any cyanosis, clubbing, or peripheral edema.  +2 pedal and radial pulses bilaterally.  NEUROLOGIC: The patient is alert, awake, and oriented  x3 with no focal motor or sensory deficits appreciated bilaterally.  SKIN: Moist and warm with no rashes appreciated.  Psych: Not anxious, depressed LN: Multiple lymph nodes enlarged in his neck t    Antibiotics   Anti-infectives (From admission, onward)   None      Medications   Scheduled Meds: . aspirin EC  81 mg Oral Daily  . fentaNYL      . insulin aspart  0-9 Units Subcutaneous Q6H  . midazolam      . pravastatin  40 mg Oral Daily   Continuous Infusions: . sodium chloride    . heparin 1,750 Units/hr (12/26/17 0921)   PRN Meds:.acetaminophen **OR** acetaminophen, HYDROcodone-acetaminophen, ondansetron **OR** ondansetron (ZOFRAN) IV, oxyCODONE   Data Review:   Micro Results No results found for this or any previous visit (from the past 240 hour(s)).  Radiology Reports Ct Soft Tissue Neck W Contrast  Result Date: 12/25/2017 CLINICAL DATA:  Bilateral submandibular swelling. EXAM: CT NECK WITH CONTRAST TECHNIQUE: Multidetector CT imaging of the neck was performed using the standard protocol following the bolus administration of intravenous contrast. CONTRAST:  81mL ISOVUE-300 IOPAMIDOL (ISOVUE-300) INJECTION 61% COMPARISON:  None. FINDINGS: Pharynx and larynx: No thickening of Waldeyer's ring. Salivary glands: Masses in the  bilateral parotids are attributed to lymph nodes. The submandibular glands are distorted by submandibular adenopathy. Thyroid: Negative Lymph nodes: 5 bulky and diffuse homogeneously enhancing lymph nodes throughout the bilateral neck, supraclavicular fossa, and axillae. 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 Visualized orbits: Negative Mastoids and visualized paranasal sinuses: Clear Skeleton: Bulky spondylosis and degenerative facet spurring. Upper chest: Negative IMPRESSION: Bulky and generalized cervical and upper thoracic adenopathy that is most consistent with lymphoma. Electronically Signed   By: Monte Fantasia M.D.   On: 12/25/2017 14:23   Ct Chest W Contrast  Result Date: 12/25/2017 CLINICAL DATA:  Bilateral neck and axillary adenopathy EXAM: CT CHEST, ABDOMEN, AND PELVIS WITH CONTRAST TECHNIQUE: Multidetector CT imaging of the chest, abdomen and pelvis was performed following the standard protocol during bolus administration of intravenous contrast. CONTRAST:  44mL ISOVUE-300 IOPAMIDOL (ISOVUE-300) INJECTION 61% COMPARISON:  None. FINDINGS: CT CHEST FINDINGS Cardiovascular: The heart is normal in size. No pericardial effusion. The aorta is normal in caliber. Scattered atherosclerotic calcifications. No dissection. The branch vessels are patent. Dense coronary artery calcifications. Bilateral fairly significant pulmonary emboli are noted. Mediastinum/Nodes: Bulky supraclavicular and axillary lymphadenopathy. Index nodal mass in the left supraclavicular region on image number 1 measures 3.6 cm. Right-sided supraclavicular node on image number 1 measures 2.6 cm. Index right axillary lymph node on image number 13 measures 4.4 cm. Index node in the left axilla on image number 13 measures 4.7 cm. Right paratracheal lymph node on image number 20 measures 16 mm and left paratracheal/AP window node on image number 22 measures 19 mm. The esophagus is  grossly normal. Lungs/Pleura: No acute pulmonary findings. No worrisome pulmonary lesions. No pleural effusions or pleural nodules. Musculoskeletal: No significant bony findings. Moderate degenerative changes involving the spine. CT ABDOMEN PELVIS FINDINGS Hepatobiliary: No focal hepatic lesions or intrahepatic biliary dilatation. The gallbladder is normal. Pancreas: No mass, inflammation or ductal dilatation. Spleen: Moderate splenomegaly. The spleen measures 15.5 x 15.0 x 11.5 cm. No focal lesions. Adrenals/Urinary Tract: The adrenal glands and kidneys are unremarkable. Stomach/Bowel: The stomach, duodenum, small bowel and colon are grossly normal. No acute inflammatory changes, mass lesions or obstructive findings. The terminal ileum and appendix are normal. Vascular/Lymphatic: Extensive lymphadenopathy  in the abdomen and pelvis. Cluster of retroperitoneal lymph nodes on image number 78 measures 10.6 x 5.4 cm and surrounds the aorta. 3.3 cm of celiac axis lymph node on image number 61. Numerous mesenteric lymph nodes. Bulky left periaortic nodal mass continues down into the pelvis. Index node on image number 95 measures 5 cm. Extensive pelvic adenopathy. Bulky operator region lymph nodes have significant mass effect in the midline on the sigmoid colon and especially the bladder which is markedly compressed. The right nodal mass measures 6.7 cm and the left nodal mass measures 6.7 cm. Extensive inguinal lymphadenopathy. Index node on the left measures 4.9 cm on image number 122 and index node on the right measures 3.9 cm on image number 126. Extensive atherosclerotic calcifications involving the aorta and iliac arteries. Reproductive: The prostate gland and seminal vesicles are grossly normal. Other: No free pelvic fluid collections. Musculoskeletal: No significant bony findings. IMPRESSION: 1. Fairly significant bilateral pulmonary emboli. 2. Bulky supraclavicular, axillary, mediastinal, mesenteric,  retroperitoneal, pelvic and inguinal lymphadenopathy most consistent with lymphoma. 3. Splenomegaly without focal splenic lesions. 4. Bulky pelvic adenopathy is compressing the bladder and sigmoid colon. These results were called by telephone at the time of interpretation on 12/25/2017 at 5:00 pm to Dr. Carloyn Manner , who verbally acknowledged these results. Electronically Signed   By: Marijo Sanes M.D.   On: 12/25/2017 17:01   Ct Abdomen Pelvis W Contrast  Result Date: 12/25/2017 CLINICAL DATA:  Bilateral neck and axillary adenopathy EXAM: CT CHEST, ABDOMEN, AND PELVIS WITH CONTRAST TECHNIQUE: Multidetector CT imaging of the chest, abdomen and pelvis was performed following the standard protocol during bolus administration of intravenous contrast. CONTRAST:  14mL ISOVUE-300 IOPAMIDOL (ISOVUE-300) INJECTION 61% COMPARISON:  None. FINDINGS: CT CHEST FINDINGS Cardiovascular: The heart is normal in size. No pericardial effusion. The aorta is normal in caliber. Scattered atherosclerotic calcifications. No dissection. The branch vessels are patent. Dense coronary artery calcifications. Bilateral fairly significant pulmonary emboli are noted. Mediastinum/Nodes: Bulky supraclavicular and axillary lymphadenopathy. Index nodal mass in the left supraclavicular region on image number 1 measures 3.6 cm. Right-sided supraclavicular node on image number 1 measures 2.6 cm. Index right axillary lymph node on image number 13 measures 4.4 cm. Index node in the left axilla on image number 13 measures 4.7 cm. Right paratracheal lymph node on image number 20 measures 16 mm and left paratracheal/AP window node on image number 22 measures 19 mm. The esophagus is grossly normal. Lungs/Pleura: No acute pulmonary findings. No worrisome pulmonary lesions. No pleural effusions or pleural nodules. Musculoskeletal: No significant bony findings. Moderate degenerative changes involving the spine. CT ABDOMEN PELVIS FINDINGS Hepatobiliary:  No focal hepatic lesions or intrahepatic biliary dilatation. The gallbladder is normal. Pancreas: No mass, inflammation or ductal dilatation. Spleen: Moderate splenomegaly. The spleen measures 15.5 x 15.0 x 11.5 cm. No focal lesions. Adrenals/Urinary Tract: The adrenal glands and kidneys are unremarkable. Stomach/Bowel: The stomach, duodenum, small bowel and colon are grossly normal. No acute inflammatory changes, mass lesions or obstructive findings. The terminal ileum and appendix are normal. Vascular/Lymphatic: Extensive lymphadenopathy in the abdomen and pelvis. Cluster of retroperitoneal lymph nodes on image number 78 measures 10.6 x 5.4 cm and surrounds the aorta. 3.3 cm of celiac axis lymph node on image number 61. Numerous mesenteric lymph nodes. Bulky left periaortic nodal mass continues down into the pelvis. Index node on image number 95 measures 5 cm. Extensive pelvic adenopathy. Bulky operator region lymph nodes have significant mass effect in the midline on  the sigmoid colon and especially the bladder which is markedly compressed. The right nodal mass measures 6.7 cm and the left nodal mass measures 6.7 cm. Extensive inguinal lymphadenopathy. Index node on the left measures 4.9 cm on image number 122 and index node on the right measures 3.9 cm on image number 126. Extensive atherosclerotic calcifications involving the aorta and iliac arteries. Reproductive: The prostate gland and seminal vesicles are grossly normal. Other: No free pelvic fluid collections. Musculoskeletal: No significant bony findings. IMPRESSION: 1. Fairly significant bilateral pulmonary emboli. 2. Bulky supraclavicular, axillary, mediastinal, mesenteric, retroperitoneal, pelvic and inguinal lymphadenopathy most consistent with lymphoma. 3. Splenomegaly without focal splenic lesions. 4. Bulky pelvic adenopathy is compressing the bladder and sigmoid colon. These results were called by telephone at the time of interpretation on  12/25/2017 at 5:00 pm to Dr. Carloyn Manner , who verbally acknowledged these results. Electronically Signed   By: Marijo Sanes M.D.   On: 12/25/2017 17:01     CBC Recent Labs  Lab 12/25/17 2029 12/26/17 0628  WBC 13.1* 11.8*  HGB 12.8* 11.9*  HCT 39.6 38.4*  PLT 294 263  MCV 85.0 86.5  MCH 27.5 26.8  MCHC 32.3 31.0  RDW 13.6 13.6  LYMPHSABS 7.3*  --   MONOABS 1.8*  --   EOSABS 0.2  --   BASOSABS 0.1  --     Chemistries  Recent Labs  Lab 12/25/17 1106 12/25/17 2029 12/26/17 0628 12/26/17 1000  NA  --  139 143  --   K  --  4.5 4.1  --   CL  --  104 107  --   CO2  --  22 27  --   GLUCOSE  --  189* 143*  --   BUN  --  37* 40*  --   CREATININE 1.50* 1.59* 1.57*  --   CALCIUM  --  9.3 8.8*  --   AST  --   --   --  17  ALT  --   --   --  14  ALKPHOS  --   --   --  63  BILITOT  --   --   --  0.6   ------------------------------------------------------------------------------------------------------------------ estimated creatinine clearance is 60.6 mL/min (A) (by C-G formula based on SCr of 1.57 mg/dL (H)). ------------------------------------------------------------------------------------------------------------------ No results for input(s): HGBA1C in the last 72 hours. ------------------------------------------------------------------------------------------------------------------ No results for input(s): CHOL, HDL, LDLCALC, TRIG, CHOLHDL, LDLDIRECT in the last 72 hours. ------------------------------------------------------------------------------------------------------------------ No results for input(s): TSH, T4TOTAL, T3FREE, THYROIDAB in the last 72 hours.  Invalid input(s): FREET3 ------------------------------------------------------------------------------------------------------------------ No results for input(s): VITAMINB12, FOLATE, FERRITIN, TIBC, IRON, RETICCTPCT in the last 72 hours.  Coagulation profile Recent Labs  Lab 12/25/17 2029  INR  0.94    No results for input(s): DDIMER in the last 72 hours.  Cardiac Enzymes Recent Labs  Lab 12/25/17 2029  TROPONINI <0.03   ------------------------------------------------------------------------------------------------------------------ Invalid input(s): POCBNP    Assessment & Plan  Patient 67 year old admitted with bilateral pulmonary embolism and diffuse lymphadenopathy   Bilateral pulmonary embolism ([place]) -plan for biopsy later today he has been switched to heparin   Thoracic lymphadenopathy -consistent with lymphoma, oncology consult as well as ENT consult initiated plan for biopsy   Diabetes mellitus, type 2 ([place]) -sliding scale insulin with corresponding glucose checks   Essential hypertension -continue home medications   PAD (peripheral artery disease) ([place]) -continue home meds, anticoagulation as above   HLD (hyperlipidemia) -continue Home dose antilipid   CKD (chronic kidney  disease), stage III ([place]) -at baseline, avoid nephrotoxins and monitor      Code Status Orders  (From admission, onward)         Start     Ordered   12/26/17 0032  Full code  Continuous     12/26/17 0031        Code Status History    Date Active Date Inactive Code Status Order ID Comments User Context   06/08/2011 1642 06/12/2011 1822 Full Code [id number]  Scarlette Calico, RN Inpatient           Consults ENT and oncology  DVT Prophylaxis heparin Lab Results  Component Value Date   PLT 263 12/26/2017     Time Spent in minutes 35 minutes greater than 50% of time spent in care coordination and counseling patient regarding the condition and plan of care.   Dustin Flock M.D on 12/26/2017 at 2:35 PM  Between 7am to 6pm - Pager - [phone]  After 6pm go to [email or web address] - Proofreader  [employer]   Office  [phone]

## 2017-12-26 NOTE — Progress Notes (Signed)
Patient with diffuse lymphadenopathy most consistent with lymphoma.  Called yesterday PM by radiology stating acute appearing significant bilateral pulmonary emboli.  Discussed with wife recommendations for admission and treatment.  Discussed also with radiology core biopsy of inguinal lymph node for tissue for diagnosis/flow.  Recently started on Lovenox due to emboli and will see what they are comfortable doing.

## 2017-12-26 NOTE — Progress Notes (Signed)
ANTICOAGULATION CONSULT NOTE - Initial Consult  Pharmacy Consult for Heparin Infusion  Indication: pulmonary embolus  Allergies  Allergen Reactions  . Penicillins Anaphylaxis and Other (See Comments)    Has patient had a PCN reaction causing immediate rash, facial/tongue/throat swelling, SOB or lightheadedness with hypotension: Unknown Has patient had a PCN reaction causing severe rash involving mucus membranes or skin necrosis: Unknown Has patient had a PCN reaction that required hospitalization: Unknown Has patient had a PCN reaction occurring within the last 10 years: No If all of the above answers are "NO", then may proceed with Cephalosporin use.     Patient Measurements: Height: 6\' 1"  (185.4 cm) Weight: 239 lb (108.4 kg) IBW/kg (Calculated) : 79.9  Vital Signs: Temp: 98.1 F (36.7 C) (10/23 1304) Temp Source: Oral (10/23 1304) BP: 122/66 (10/23 1350) Pulse Rate: 80 (10/23 1350)  Labs: Recent Labs    12/25/17 1106 12/25/17 2029 12/26/17 0628  HGB  --  12.8* 11.9*  HCT  --  39.6 38.4*  PLT  --  294 263  APTT  --  30  --   LABPROT  --  12.5  --   INR  --  0.94  --   CREATININE 1.50* 1.59* 1.57*  TROPONINI  --  <0.03  --     Estimated Creatinine Clearance: 60.6 mL/min (A) (by C-G formula based on SCr of 1.57 mg/dL (H)).   Medical History: Past Medical History:  Diagnosis Date  . Anxiety   . Arthritis   . 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 HOH (hard of hearing)   . Hypertension   . Osteomyelitis of toe ([place]) 06/08/11   right foot  . Seasonal allergies   . Type II diabetes mellitus ([facility])    diet controlled    Medications:  Scheduled:  . aspirin EC  81 mg Oral Daily  . fentaNYL      . insulin aspart  0-9 Units Subcutaneous Q6H  . midazolam      . pravastatin  40 mg Oral Daily    Assessment: Patient admitted for neck swelling and lymphadenopathy and was seen by ENT to r/o lymphoma and told to come in by MD office after CT showed bilateral  PE's.  Patient is not on any anticoagulation PTA. Patient received a dose of lovenox 1 mg/kg subq x 1 (110 mg).  Pharmacy initially consulted for enoxaparin dosing. Due to plan for scheduled  biopsy on 10/24, pharmacy now consulted for heparin drip dosing and monitoring.   Goal of Therapy:  Anti-Xa: 0.3-0.7  Monitor platelets by anticoagulation protocol: Yes   Plan:  Patient has received enoxaparin 110 mg subq x 1 in ED @ 2130 on 10/22. Will start Heparin infusion @ 1750 units/hr with no bolus.  Heparin level ordered 6 hours after start of infusion.  Will monitor daily CBC and HLs per protocol.  Heparin drip held prior to lymph nose biopsy. Ok to resume anticoag 2 hr post procedure if L inguinal site stable per Dr. Vernard Gambles. Heparin drip to start @ 1600.  Will draw level 6 hours after drip resumed.   Pernell Dupre, PharmD, BCPS Clinical Pharmacist 12/26/2017 2:10 PM

## 2017-12-26 NOTE — Progress Notes (Signed)
*  PRELIMINARY RESULTS* Echocardiogram 2D Echocardiogram has been performed.  [name] 12/26/2017, 10:42 AM

## 2017-12-26 NOTE — Procedures (Signed)
  Procedure: [place] core bx L ing LAN 18g x6 in saline EBL:   minimal Complications:  none immediate  See full dictation in [facility].  Dillard Cannon MD Main # [phone] Pager  [phone]

## 2017-12-26 NOTE — Progress Notes (Signed)
ANTICOAGULATION CONSULT NOTE - Initial Consult  Pharmacy Consult for Heparin Infusion  Indication: pulmonary embolus  Allergies  Allergen Reactions  . Penicillins Anaphylaxis and Other (See Comments)    Has patient had a PCN reaction causing immediate rash, facial/tongue/throat swelling, SOB or lightheadedness with hypotension: Unknown Has patient had a PCN reaction causing severe rash involving mucus membranes or skin necrosis: Unknown Has patient had a PCN reaction that required hospitalization: Unknown Has patient had a PCN reaction occurring within the last 10 years: No If all of the above answers are "NO", then may proceed with Cephalosporin use.     Patient Measurements: Height: 6\' 1"  (185.4 cm) Weight: 239 lb (108.4 kg) IBW/kg (Calculated) : 79.9  Vital Signs: Temp: 97.5 F (36.4 C) (10/23 1954) Temp Source: Oral (10/23 1954) BP: 140/73 (10/23 1954) Pulse Rate: 75 (10/23 1954)  Labs: Recent Labs    12/25/17 1106 12/25/17 2029 12/26/17 0628 12/26/17 2159  HGB  --  12.8* 11.9*  --   HCT  --  39.6 38.4*  --   PLT  --  294 263  --   APTT  --  30  --   --   LABPROT  --  12.5  --   --   INR  --  0.94  --   --   HEPARINUNFRC  --   --   --  0.46  CREATININE 1.50* 1.59* 1.57*  --   TROPONINI  --  <0.03  --   --     Estimated Creatinine Clearance: 60.6 mL/min (A) (by C-G formula based on SCr of 1.57 mg/dL (H)).   Medical History: Past Medical History:  Diagnosis Date  . Anxiety   . Arthritis   . 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 HOH (hard of hearing)   . Hypertension   . Osteomyelitis of toe ([place]) 06/08/11   right foot  . Seasonal allergies   . Type II diabetes mellitus ([facility])    diet controlled    Medications:  Scheduled:  . aspirin EC  81 mg Oral Daily  . fentaNYL      . insulin aspart  0-9 Units Subcutaneous Q6H  . midazolam      . pravastatin  40 mg Oral Daily    Assessment: Patient admitted for neck swelling and lymphadenopathy and  was seen by ENT to r/o lymphoma and told to come in by MD office after CT showed bilateral PE's.  Patient is not on any anticoagulation PTA. Patient received a dose of lovenox 1 mg/kg subq x 1 (110 mg).  Pharmacy initially consulted for enoxaparin dosing. Due to plan for scheduled  biopsy on 10/24, pharmacy now consulted for heparin drip dosing and monitoring.   Goal of Therapy:  Anti-Xa: 0.3-0.7  Monitor platelets by anticoagulation protocol: Yes   Plan:  10/23 @ 2200 HL 0.46 therapeutic. Will continue current rate and will recheck next anti-Xa w/ am labs. Will continue to monitor CBC.  Tobie Lords, PharmD, BCPS Clinical Pharmacist 12/26/2017 10:57 PM

## 2017-12-26 NOTE — Progress Notes (Signed)
ANTICOAGULATION CONSULT NOTE - Initial Consult  Pharmacy Consult for Heparin Infusion  Indication: pulmonary embolus  Allergies  Allergen Reactions  . Penicillins Anaphylaxis and Other (See Comments)    Has patient had a PCN reaction causing immediate rash, facial/tongue/throat swelling, SOB or lightheadedness with hypotension: Unknown Has patient had a PCN reaction causing severe rash involving mucus membranes or skin necrosis: Unknown Has patient had a PCN reaction that required hospitalization: Unknown Has patient had a PCN reaction occurring within the last 10 years: No If all of the above answers are "NO", then may proceed with Cephalosporin use.     Patient Measurements: Height: 6\' 1"  (185.4 cm) Weight: 239 lb (108.4 kg) IBW/kg (Calculated) : 79.9  Vital Signs: Temp: 98.2 F (36.8 C) (10/23 0416) Temp Source: Oral (10/23 0416) BP: 146/77 (10/23 0416) Pulse Rate: 89 (10/23 0416)  Labs: Recent Labs    12/25/17 1106 12/25/17 2029 12/26/17 0628  HGB  --  12.8* 11.9*  HCT  --  39.6 38.4*  PLT  --  294 263  APTT  --  30  --   LABPROT  --  12.5  --   INR  --  0.94  --   CREATININE 1.50* 1.59* 1.57*  TROPONINI  --  <0.03  --     Estimated Creatinine Clearance: 60.6 mL/min (A) (by C-G formula based on SCr of 1.57 mg/dL (H)).   Medical History: Past Medical History:  Diagnosis Date  . Anxiety   . Arthritis   . 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 HOH (hard of hearing)   . Hypertension   . Osteomyelitis of toe ([place]) 06/08/11   right foot  . Seasonal allergies   . Type II diabetes mellitus (HCC)    diet controlled    Medications:  Scheduled:  . aspirin EC  81 mg Oral Daily  . insulin aspart  0-9 Units Subcutaneous Q6H  . pravastatin  40 mg Oral Daily    Assessment: Patient admitted for neck swelling and lymphadenopathy and was seen by ENT to r/o lymphoma and told to come in by MD office after CT showed bilateral PE's.  Patient is not on any  anticoagulation PTA. Patient received a dose of lovenox 1 mg/kg subq x 1 (110 mg).  Pharmacy initially consulted for enoxaparin dosing. Due to plan for scheduled  biopsy on 10/24, pharmacy now consulted for heparin drip dosing and monitoring.   Goal of Therapy:  Anti-Xa: 0.3-0.7  Monitor platelets by anticoagulation protocol: Yes   Plan:  Patient has received enoxaparin 110 mg subq x 1 in ED @ 2130 on 10/22. Will start Heparin infusion @ 1750 units/hr with no bolus.  Heparin level ordered 6 hours after start of infusion.  Will monitor daily CBC and HLs per protocol.  Plan to hold heparin drip 2 hours prior to biopsy per Dr. Posey Pronto.   Pernell Dupre, PharmD, BCPS Clinical Pharmacist 12/26/2017 9:08 AM

## 2017-12-27 ENCOUNTER — Other Ambulatory Visit: Payer: Self-pay | Admitting: Internal Medicine

## 2017-12-27 ENCOUNTER — Other Ambulatory Visit: Payer: Self-pay

## 2017-12-27 DIAGNOSIS — R944 Abnormal results of kidney function studies: Secondary | ICD-10-CM

## 2017-12-27 DIAGNOSIS — C8308 Small cell B-cell lymphoma, lymph nodes of multiple sites: Secondary | ICD-10-CM | POA: Insufficient documentation

## 2017-12-27 LAB — CBC
HCT: 38.9 % — ABNORMAL LOW (ref 39.0–52.0)
Hemoglobin: 11.9 g/dL — ABNORMAL LOW (ref 13.0–17.0)
MCH: 26.7 pg (ref 26.0–34.0)
MCHC: 30.6 g/dL (ref 30.0–36.0)
MCV: 87.4 fL (ref 80.0–100.0)
Platelets: 259 10*3/uL (ref 150–400)
RBC: 4.45 MIL/uL (ref 4.22–5.81)
RDW: 13.7 % (ref 11.5–15.5)
WBC: 12.6 10*3/uL — ABNORMAL HIGH (ref 4.0–10.5)
nRBC: 0 % (ref 0.0–0.2)

## 2017-12-27 LAB — DIFFERENTIAL
Basophils Absolute: 0.1 10*3/uL (ref 0.0–0.1)
Basophils Relative: 1 %
Eosinophils Absolute: 0.4 10*3/uL (ref 0.0–0.5)
Eosinophils Relative: 3 %
Lymphocytes Relative: 61 %
Lymphs Abs: 7.7 10*3/uL — ABNORMAL HIGH (ref 0.7–4.0)
Monocytes Absolute: 0.5 10*3/uL (ref 0.1–1.0)
Monocytes Relative: 4 %
Neutro Abs: 3.9 10*3/uL (ref 1.7–7.7)
Neutrophils Relative %: 31 %

## 2017-12-27 LAB — HEPATITIS C ANTIBODY: HCV Ab: <0.1 s/co ratio (ref 0.0–0.9)

## 2017-12-27 LAB — HEPARIN LEVEL (UNFRACTIONATED): Heparin Unfractionated: 0.58 IU/mL (ref 0.30–0.70)

## 2017-12-27 LAB — HEPATITIS B CORE ANTIBODY, IGM: Hep B C IgM: NEGATIVE

## 2017-12-27 LAB — HEPATITIS B SURFACE ANTIGEN: Hepatitis B Surface Ag: NEGATIVE

## 2017-12-27 LAB — GLUCOSE, CAPILLARY
Glucose-Capillary: 141 mg/dL — ABNORMAL HIGH (ref 70–99)
Glucose-Capillary: 231 mg/dL — ABNORMAL HIGH (ref 70–99)

## 2017-12-27 LAB — BETA 2 MICROGLOBULIN, SERUM: Beta-2 Microglobulin: 7.2 mg/L — ABNORMAL HIGH (ref 0.6–2.4)

## 2017-12-27 LAB — HIV ANTIBODY (ROUTINE TESTING W REFLEX): HIV Screen 4th Generation wRfx: NONREACTIVE

## 2017-12-27 LAB — PATHOLOGIST SMEAR REVIEW

## 2017-12-27 MED ORDER — ALLOPURINOL 300 MG PO TABS: 300.0000 mg | ORAL_TABLET | Freq: Two times a day (BID) | ORAL | 3 refills | Status: DC

## 2017-12-27 MED ORDER — ENOXAPARIN SODIUM 120 MG/0.8ML ~~LOC~~ SOLN: 1.0000 mg/kg | Freq: Two times a day (BID) | SUBCUTANEOUS | 0 refills | Status: DC

## 2017-12-27 MED ORDER — ENOXAPARIN SODIUM 120 MG/0.8ML ~~LOC~~ SOLN
1.0000 mg/kg | Freq: Two times a day (BID) | SUBCUTANEOUS | Status: DC
  Administered 2017-12-27: 110 mg via SUBCUTANEOUS
  Filled 2017-12-27 (×2): qty 0.8

## 2017-12-27 NOTE — Progress Notes (Signed)
ANTICOAGULATION CONSULT NOTE - Initial Consult  Pharmacy Consult for Heparin Infusion  Indication: pulmonary embolus  Allergies  Allergen Reactions  . Penicillins Anaphylaxis and Other (See Comments)    Has patient had a PCN reaction causing immediate rash, facial/tongue/throat swelling, SOB or lightheadedness with hypotension: Unknown Has patient had a PCN reaction causing severe rash involving mucus membranes or skin necrosis: Unknown Has patient had a PCN reaction that required hospitalization: Unknown Has patient had a PCN reaction occurring within the last 10 years: No If all of the above answers are "NO", then may proceed with Cephalosporin use.     Patient Measurements: Height: 6\' 1"  (035.0 cm) Weight: 239 lb (108.4 kg) IBW/kg (Calculated) : 79.9  Vital Signs: Temp: 97.8 F (36.6 C) (10/24 0434) Temp Source: Oral (10/24 0434) BP: 161/80 (10/24 0434) Pulse Rate: 82 (10/24 0434)  Labs: Recent Labs    12/25/17 1106  12/25/17 2029 12/26/17 0628 12/26/17 2159 12/27/17 0432  HGB  --    < > 12.8* 11.9*  --  11.9*  HCT  --   --  39.6 38.4*  --  38.9*  PLT  --   --  294 263  --  259  APTT  --   --  30  --   --   --   LABPROT  --   --  12.5  --   --   --   INR  --   --  0.94  --   --   --   HEPARINUNFRC  --   --   --   --  0.46 0.58  CREATININE 1.50*  --  1.59* 1.57*  --   --   TROPONINI  --   --  <0.03  --   --   --    < > = values in this interval not displayed.    Estimated Creatinine Clearance: 60.6 mL/min (A) (by C-G formula based on SCr of 1.57 mg/dL (H)).   Medical History: Past Medical History:  Diagnosis Date  . Anxiety   . Arthritis   . 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 HOH (hard of hearing)   . Hypertension   . 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 Seasonal allergies   . Type II diabetes mellitus (HCC)    diet controlled    Medications:  Scheduled:  . aspirin EC  81 mg Oral Daily  . insulin aspart  0-9 Units  Subcutaneous Q6H  . pravastatin  40 mg Oral Daily    Assessment: Patient admitted for neck swelling and lymphadenopathy and was seen by ENT to r/o lymphoma and told to come in by MD office after CT showed bilateral PE's.  Patient is not on any anticoagulation PTA. Patient received a dose of lovenox 1 mg/kg subq x 1 (110 mg).  Pharmacy initially consulted for enoxaparin dosing. Due to plan for scheduled  biopsy on 10/24, pharmacy now consulted for heparin drip dosing and monitoring.   Goal of Therapy:  Anti-Xa: 0.3-0.7  Monitor platelets by anticoagulation protocol: Yes   Plan:  10/24 @ 0500 HL 0.58 therapeutic. Will continue current and will recheck next HL w/ am labs. CBC stable will continue to monitor.  Tobie Lords, PharmD, BCPS Clinical Pharmacist 12/27/2017 6:48 AM

## 2017-12-27 NOTE — Progress Notes (Signed)
Recommend PET scan ASAP/early-mid next week.  Follow up with me in 1 week/cbc-bmp.  Also, allopurinol 300 mg BID/ script sent to pharmacy. Asked pt to check with pharmacy prior to discharge.

## 2017-12-27 NOTE — Progress Notes (Signed)
.. 12/27/2017 6:59 AM  [place] [id number]    Temp:  [97.3 F (36.3 C)-98.1 F (36.7 C)] 97.8 F (36.6 C) (10/24 0434) Pulse Rate:  [75-82] 82 (10/24 0434) Resp:  [12-18] 17 (10/24 0434) BP: (111-161)/(59-80) 161/80 (10/24 0434) SpO2:  [95 %-99 %] 97 % (10/24 0434),     Intake/Output Summary (Last 24 hours) at 12/27/2017 0659 Last data filed at 12/27/2017 0400 Gross per 24 hour  Intake 207.26 ml  Output 500 ml  Net -292.74 ml    Results for orders placed or performed during the hospital encounter of 12/25/17 (from the past 24 hour(s))  Glucose, capillary     Status: Abnormal   Collection Time: 12/26/17  9:27 AM  Result Value Ref Range   Glucose-Capillary 150 (H) 70 - 99 mg/dL  Lactate dehydrogenase     Status: Abnormal   Collection Time: 12/26/17 10:00 AM  Result Value Ref Range   LDH 263 (H) 98 - 192 U/L  Uric acid     Status: Abnormal   Collection Time: 12/26/17 10:00 AM  Result Value Ref Range   Uric Acid, Serum 10.6 (H) 3.7 - 8.6 mg/dL  Hepatic function panel     Status: None   Collection Time: 12/26/17 10:00 AM  Result Value Ref Range   Total Protein 6.7 6.5 - 8.1 g/dL   Albumin 4.2 3.5 - 5.0 g/dL   AST 17 15 - 41 U/L   ALT 14 0 - 44 U/L   Alkaline Phosphatase 63 38 - 126 U/L   Total Bilirubin 0.6 0.3 - 1.2 mg/dL   Bilirubin, Direct 0.1 0.0 - 0.2 mg/dL   Indirect Bilirubin 0.5 0.3 - 0.9 mg/dL  Glucose, capillary     Status: Abnormal   Collection Time: 12/26/17 12:00 PM  Result Value Ref Range   Glucose-Capillary 158 (H) 70 - 99 mg/dL  Glucose, capillary     Status: Abnormal   Collection Time: 12/26/17  5:52 PM  Result Value Ref Range   Glucose-Capillary 148 (H) 70 - 99 mg/dL  Glucose, capillary     Status: Abnormal   Collection Time: 12/26/17  8:54 PM  Result Value Ref Range   Glucose-Capillary 157 (H) 70 - 99 mg/dL  Heparin level (unfractionated)     Status: None   Collection Time: 12/26/17  9:59 PM  Result Value Ref Range   Heparin  Unfractionated 0.46 0.30 - 0.70 IU/mL  Glucose, capillary     Status: Abnormal   Collection Time: 12/26/17 11:54 PM  Result Value Ref Range   Glucose-Capillary 156 (H) 70 - 99 mg/dL  CBC     Status: Abnormal   Collection Time: 12/27/17  4:32 AM  Result Value Ref Range   WBC 12.6 (H) 4.0 - 10.5 K/uL   RBC 4.45 4.22 - 5.81 MIL/uL   Hemoglobin 11.9 (L) 13.0 - 17.0 g/dL   HCT 38.9 (L) 39.0 - 52.0 %   MCV 87.4 80.0 - 100.0 fL   MCH 26.7 26.0 - 34.0 pg   MCHC 30.6 30.0 - 36.0 g/dL   RDW 13.7 11.5 - 15.5 %   Platelets 259 150 - 400 K/uL   nRBC 0.0 0.0 - 0.2 %  Heparin level (unfractionated)     Status: None   Collection Time: 12/27/17  4:32 AM  Result Value Ref Range   Heparin Unfractionated 0.58 0.30 - 0.70 IU/mL  Glucose, capillary     Status: Abnormal   Collection Time: 12/27/17  6:12 AM  Result Value Ref Range   Glucose-Capillary 141 (H) 70 - 99 mg/dL    SUBJECTIVE:  No acute events.  Got inguinal biopsy yesterday afternoon.  On Heparin gtts.  OBJECTIVE:  GEN-  NAD, sitting upright in bed NECK-  Large 3cm + lymph nodes in neck level 2 and level 1 bilaterally with multiple lymph nodes in parotid glands as well  IMPRESSION:  Bilateral PEs and Generalized lymphadenopathy most likely Lymphoma s/p core biopsy  PLAN:  Defer to PE anticoagulation to Medicine.  Oncology has seen patient and is on board and awaiting biopsy results.  Will continue to follow.  Artin Mceuen 12/27/2017, 6:59 AM

## 2017-12-27 NOTE — Discharge Summary (Signed)
[employer] [facility] [place] at [facility], 67 y.o., DOB 09-26-1950, MRN [id number]. Admission date: 12/25/2017 Discharge Date 12/27/2017 Primary MD [name], MD Admitting Physician Lance Coon, MD  Admission Diagnosis  Acute pulmonary embolism, unspecified pulmonary embolism type, unspecified whether acute cor pulmonale present [facility]) [I26.99]  Discharge Diagnosis   Principal Problem: Bilateral pulmonary embolism Lymphadenopathy status post biopsy Diabetes type 2 Essential hypertension PAD Peripheral arterial disease Hyperlipidemia Chronic kidney disease stage III  Hospital Course  [name]  is a 67 y.o. male who presents with chief complaint as above.  Patient presents to the ED tonight after being contacted by ENT physician and told he had bilateral pulmonary emboli on r CT scan of the chest.  Patient was sent to the ED and admitted for bilateral pulmonary embolisms.  He was started on IV heparin.  He also had lymphadenopathy was seen by oncology.  Patient underwent biopsy of the lymph nodes pathology currently pending.  Due to bilateral pulmonary embolism and likely malignancy he will need Lovenox therapy according to hematology.  Patient will have other work-up set up by hematology they will come and evaluate the patient later today.    Patient has been insisting to go home since his admission.         Consults  hematology/oncology  Significant Tests:  See full reports for all details    Ct Soft Tissue Neck W Contrast  Result Date: 12/25/2017 CLINICAL DATA:  Bilateral submandibular swelling. EXAM: CT NECK WITH CONTRAST TECHNIQUE: Multidetector CT imaging of the neck was performed using the standard protocol following the bolus administration of intravenous contrast. CONTRAST:  57mL ISOVUE-300 IOPAMIDOL (ISOVUE-300) INJECTION 61% COMPARISON:  None. FINDINGS: Pharynx and larynx: No thickening of Waldeyer's ring. Salivary glands:  Masses in the bilateral parotids are attributed to lymph nodes. The submandibular glands are distorted by submandibular adenopathy. Thyroid: Negative Lymph nodes: 5 bulky and diffuse homogeneously enhancing lymph nodes throughout the bilateral neck, supraclavicular fossa, and axillae. 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 Visualized orbits: Negative Mastoids and visualized paranasal sinuses: Clear Skeleton: Bulky spondylosis and degenerative facet spurring. Upper chest: Negative IMPRESSION: Bulky and generalized cervical and upper thoracic adenopathy that is most consistent with lymphoma. Electronically Signed   By: Monte Fantasia M.D.   On: 12/25/2017 14:23   Ct Chest W Contrast  Result Date: 12/25/2017 CLINICAL DATA:  Bilateral neck and axillary adenopathy EXAM: CT CHEST, ABDOMEN, AND PELVIS WITH CONTRAST TECHNIQUE: Multidetector CT imaging of the chest, abdomen and pelvis was performed following the standard protocol during bolus administration of intravenous contrast. CONTRAST:  42mL ISOVUE-300 IOPAMIDOL (ISOVUE-300) INJECTION 61% COMPARISON:  None. FINDINGS: CT CHEST FINDINGS Cardiovascular: The heart is normal in size. No pericardial effusion. The aorta is normal in caliber. Scattered atherosclerotic calcifications. No dissection. The branch vessels are patent. Dense coronary artery calcifications. Bilateral fairly significant pulmonary emboli are noted. Mediastinum/Nodes: Bulky supraclavicular and axillary lymphadenopathy. Index nodal mass in the left supraclavicular region on image number 1 measures 3.6 cm. Right-sided supraclavicular node on image number 1 measures 2.6 cm. Index right axillary lymph node on image number 13 measures 4.4 cm. Index node in the left axilla on image number 13 measures 4.7 cm. Right paratracheal lymph node on image number 20 measures 16 mm and left paratracheal/AP window node on image number 22 measures 19 mm. The  esophagus is grossly normal. Lungs/Pleura: No acute pulmonary findings. No worrisome pulmonary lesions. No pleural  effusions or pleural nodules. Musculoskeletal: No significant bony findings. Moderate degenerative changes involving the spine. CT ABDOMEN PELVIS FINDINGS Hepatobiliary: No focal hepatic lesions or intrahepatic biliary dilatation. The gallbladder is normal. Pancreas: No mass, inflammation or ductal dilatation. Spleen: Moderate splenomegaly. The spleen measures 15.5 x 15.0 x 11.5 cm. No focal lesions. Adrenals/Urinary Tract: The adrenal glands and kidneys are unremarkable. Stomach/Bowel: The stomach, duodenum, small bowel and colon are grossly normal. No acute inflammatory changes, mass lesions or obstructive findings. The terminal ileum and appendix are normal. Vascular/Lymphatic: Extensive lymphadenopathy in the abdomen and pelvis. Cluster of retroperitoneal lymph nodes on image number 78 measures 10.6 x 5.4 cm and surrounds the aorta. 3.3 cm of celiac axis lymph node on image number 61. Numerous mesenteric lymph nodes. Bulky left periaortic nodal mass continues down into the pelvis. Index node on image number 95 measures 5 cm. Extensive pelvic adenopathy. Bulky operator region lymph nodes have significant mass effect in the midline on the sigmoid colon and especially the bladder which is markedly compressed. The right nodal mass measures 6.7 cm and the left nodal mass measures 6.7 cm. Extensive inguinal lymphadenopathy. Index node on the left measures 4.9 cm on image number 122 and index node on the right measures 3.9 cm on image number 126. Extensive atherosclerotic calcifications involving the aorta and iliac arteries. Reproductive: The prostate gland and seminal vesicles are grossly normal. Other: No free pelvic fluid collections. Musculoskeletal: No significant bony findings. IMPRESSION: 1. Fairly significant bilateral pulmonary emboli. 2. Bulky supraclavicular, axillary, mediastinal, mesenteric,  retroperitoneal, pelvic and inguinal lymphadenopathy most consistent with lymphoma. 3. Splenomegaly without focal splenic lesions. 4. Bulky pelvic adenopathy is compressing the bladder and sigmoid colon. These results were called by telephone at the time of interpretation on 12/25/2017 at 5:00 pm to Dr. Carloyn Manner , who verbally acknowledged these results. Electronically Signed   By: Marijo Sanes M.D.   On: 12/25/2017 17:01   Ct Abdomen Pelvis W Contrast  Result Date: 12/25/2017 CLINICAL DATA:  Bilateral neck and axillary adenopathy EXAM: CT CHEST, ABDOMEN, AND PELVIS WITH CONTRAST TECHNIQUE: Multidetector CT imaging of the chest, abdomen and pelvis was performed following the standard protocol during bolus administration of intravenous contrast. CONTRAST:  63mL ISOVUE-300 IOPAMIDOL (ISOVUE-300) INJECTION 61% COMPARISON:  None. FINDINGS: CT CHEST FINDINGS Cardiovascular: The heart is normal in size. No pericardial effusion. The aorta is normal in caliber. Scattered atherosclerotic calcifications. No dissection. The branch vessels are patent. Dense coronary artery calcifications. Bilateral fairly significant pulmonary emboli are noted. Mediastinum/Nodes: Bulky supraclavicular and axillary lymphadenopathy. Index nodal mass in the left supraclavicular region on image number 1 measures 3.6 cm. Right-sided supraclavicular node on image number 1 measures 2.6 cm. Index right axillary lymph node on image number 13 measures 4.4 cm. Index node in the left axilla on image number 13 measures 4.7 cm. Right paratracheal lymph node on image number 20 measures 16 mm and left paratracheal/AP window node on image number 22 measures 19 mm. The esophagus is grossly normal. Lungs/Pleura: No acute pulmonary findings. No worrisome pulmonary lesions. No pleural effusions or pleural nodules. Musculoskeletal: No significant bony findings. Moderate degenerative changes involving the spine. CT ABDOMEN PELVIS FINDINGS Hepatobiliary:  No focal hepatic lesions or intrahepatic biliary dilatation. The gallbladder is normal. Pancreas: No mass, inflammation or ductal dilatation. Spleen: Moderate splenomegaly. The spleen measures 15.5 x 15.0 x 11.5 cm. No focal lesions. Adrenals/Urinary Tract: The adrenal glands and kidneys are unremarkable. Stomach/Bowel: The stomach, duodenum, small bowel and colon are grossly  normal. No acute inflammatory changes, mass lesions or obstructive findings. The terminal ileum and appendix are normal. Vascular/Lymphatic: Extensive lymphadenopathy in the abdomen and pelvis. Cluster of retroperitoneal lymph nodes on image number 78 measures 10.6 x 5.4 cm and surrounds the aorta. 3.3 cm of celiac axis lymph node on image number 61. Numerous mesenteric lymph nodes. Bulky left periaortic nodal mass continues down into the pelvis. Index node on image number 95 measures 5 cm. Extensive pelvic adenopathy. Bulky operator region lymph nodes have significant mass effect in the midline on the sigmoid colon and especially the bladder which is markedly compressed. The right nodal mass measures 6.7 cm and the left nodal mass measures 6.7 cm. Extensive inguinal lymphadenopathy. Index node on the left measures 4.9 cm on image number 122 and index node on the right measures 3.9 cm on image number 126. Extensive atherosclerotic calcifications involving the aorta and iliac arteries. Reproductive: The prostate gland and seminal vesicles are grossly normal. Other: No free pelvic fluid collections. Musculoskeletal: No significant bony findings. IMPRESSION: 1. Fairly significant bilateral pulmonary emboli. 2. Bulky supraclavicular, axillary, mediastinal, mesenteric, retroperitoneal, pelvic and inguinal lymphadenopathy most consistent with lymphoma. 3. Splenomegaly without focal splenic lesions. 4. Bulky pelvic adenopathy is compressing the bladder and sigmoid colon. These results were called by telephone at the time of interpretation on  12/25/2017 at 5:00 pm to Dr. Carloyn Manner , who verbally acknowledged these results. Electronically Signed   By: Marijo Sanes M.D.   On: 12/25/2017 17:01   [place] Core Biopsy (lymph Nodes)  Result Date: 12/26/2017 CLINICAL DATA:  Extensive multilevel lymphadenopathy. No known primary. EXAM: ULTRASOUND GUIDED CORE BIOPSY OF LEFT INGUINAL ADENOPATHY MEDICATIONS: Intravenous Fentanyl and Versed were administered as conscious sedation during continuous monitoring of the patient's level of consciousness and physiological / cardiorespiratory status by the radiology RN, with a total moderate sedation time of 11 minutes. PROCEDURE: The procedure, risks, benefits, and alternatives were explained to the patient. Questions regarding the procedure were encouraged and answered. The patient understands and consents to the procedure. Survey ultrasound of the left inguinal region performed. Adenopathy with nodes measured up to 5.7 cm were identified. An appropriate skin entry site was determined and marked. The operative field was prepped with chlorhexidine in a sterile fashion, and a sterile drape was applied covering the operative field. A sterile gown and sterile gloves were used for the procedure. Local anesthesia was provided with 1% Lidocaine. Under real-time ultrasound guidance, a 17 gauge trocar needle was advanced to the margin of the lesion. Once needle tip position was confirmed, coaxial 18-gauge core biopsy samples were obtained, submitted in saline to surgical pathology. The guide needle was removed. Postprocedure images show no hematoma or other apparent complication. The patient tolerated the procedure well. COMPLICATIONS: None. FINDINGS: Marked left inguinal adenopathy was confirmed. Representative core biopsy samples obtained as above IMPRESSION: 1. Technically successful ultrasound-guided core biopsy, left inguinal adenopathy Electronically Signed   By: Lucrezia Europe M.D.   On: 12/26/2017 14:41       Today    Subjective:   [name] patient very anxious to go home he is been asking to be discharged since coming to the hospital Objective:   Blood pressure 126/67, pulse 77, temperature 98 F (36.7 C), temperature source Oral, resp. rate 20, height 6\' 1"  (1.854 m), weight 108.4 kg, SpO2 97 %.  .  Intake/Output Summary (Last 24 hours) at 12/27/2017 1349 Last data filed at 12/27/2017 1022 Gross per 24 hour  Intake 687.26  ml  Output 500 ml  Net 187.26 ml    Exam VITAL SIGNS: Blood pressure 126/67, pulse 77, temperature 98 F (36.7 C), temperature source Oral, resp. rate 20, height 6\' 1"  (1.854 m), weight 108.4 kg, SpO2 97 %.  GENERAL:  67 y.o.-year-old patient lying in the bed with no acute distress.  EYES: Pupils equal, round, reactive to light and accommodation. No scleral icterus. Extraocular muscles intact.  HEENT: Head atraumatic, normocephalic. Oropharynx and nasopharynx clear.  NECK:  Supple, no jugular venous distention. No thyroid enlargement, no tenderness.  LUNGS: Normal breath sounds bilaterally, no wheezing, rales,rhonchi or crepitation. No use of accessory muscles of respiration.  CARDIOVASCULAR: S1, S2 normal. No murmurs, rubs, or gallops.  ABDOMEN: Soft, nontender, nondistended. Bowel sounds present. No organomegaly or mass.  EXTREMITIES: No pedal edema, cyanosis, or clubbing.  NEUROLOGIC: Cranial nerves II through XII are intact. Muscle strength 5/5 in all extremities. Sensation intact. Gait not checked.  PSYCHIATRIC: The patient is alert and oriented x 3.  SKIN: No obvious rash, lesion, or ulcer.   Data Review     CBC w Diff:  Lab Results  Component Value Date   WBC 12.6 (H) 12/27/2017   HGB 11.9 (L) 12/27/2017   HCT 38.9 (L) 12/27/2017   PLT 259 12/27/2017   LYMPHOPCT 61 12/27/2017   MONOPCT 4 12/27/2017   EOSPCT 3 12/27/2017   BASOPCT 1 12/27/2017   CMP:  Lab Results  Component Value Date   NA 143 12/26/2017   K 4.1 12/26/2017   CL 107 12/26/2017    CO2 27 12/26/2017   BUN 40 (H) 12/26/2017   CREATININE 1.57 (H) 12/26/2017   CREATININE 0.88 11/04/2010   PROT 6.7 12/26/2017   ALBUMIN 4.2 12/26/2017   BILITOT 0.6 12/26/2017   ALKPHOS 63 12/26/2017   AST 17 12/26/2017   ALT 14 12/26/2017  .  Micro Results No results found for this or any previous visit (from the past 240 hour(s)).      Code Status Orders  (From admission, onward)         Start     Ordered   12/26/17 0032  Full code  Continuous     12/26/17 0031        Code Status History    Date Active Date Inactive Code Status Order ID Comments User Context   06/08/2011 1642 06/12/2011 1822 Full Code [id number]  Scarlette Calico, RN Inpatient          Follow-up Information    [name], MD. Go on 01/01/2018.   Specialty:  Internal Medicine Why:  @10 :15 AM Contact information: [address]   [place] [address] [phone]        Cammie Sickle, MD. Go on 01/04/2018.   Specialties:  Internal Medicine, Oncology Why:  @1 :00 PM Contact information: [place] [address] [phone]           Discharge Medications   Allergies as of 12/27/2017      Reactions   Penicillins Anaphylaxis, Other (See Comments)   Has patient had a PCN reaction causing immediate rash, facial/tongue/throat swelling, SOB or lightheadedness with hypotension: Unknown Has patient had a PCN reaction causing severe rash involving mucus membranes or skin necrosis: Unknown Has patient had a PCN reaction that required hospitalization: Unknown Has patient had a PCN reaction occurring within the last 10 years: No If all of the above answers are "NO", then may proceed with Cephalosporin use.  Medication List    TAKE these medications   aspirin EC 81 MG tablet Take 81 mg by mouth daily.   ECHINACEA PO Take 760 mg by mouth daily.   enoxaparin 120 MG/0.8ML injection Commonly known as:  LOVENOX Inject 0.72 mLs (110 mg total) into the  skin every 12 (twelve) hours.   fluticasone 50 MCG/ACT nasal spray Commonly known as:  FLONASE Place 2 sprays into both nostrils daily as needed for allergies or rhinitis.   fluticasone 50 MCG/ACT nasal spray Commonly known as:  FLONASE Place 2 sprays into the nose daily.   Ginseng 100 MG Caps Take 100 mg by mouth daily.   hydrochlorothiazide 12.5 MG capsule Commonly known as:  MICROZIDE Take 12.5 mg by mouth daily.   JANUVIA 100 MG tablet Generic drug:  sitaGLIPtin Take 100 mg by mouth daily.   lisinopril 40 MG tablet Commonly known as:  PRINIVIL,ZESTRIL Take 40 mg by mouth daily.   metFORMIN 500 MG tablet Commonly known as:  GLUCOPHAGE Take 1,000 mg by mouth 2 (two) times daily.   OMEGA 3 500 500 MG Caps Take 500 mg by mouth daily.   pravastatin 40 MG tablet Commonly known as:  PRAVACHOL Take 40 mg by mouth daily.   Saw Palmetto 450 MG Caps Take 450 mg by mouth daily.          Total Time in preparing paper work, data evaluation and todays exam - 75 minutes  Dustin Flock M.D on 12/27/2017 at [place]  [phone]

## 2017-12-27 NOTE — Progress Notes (Signed)
Pt axox4, no acute distress. Discharge instructions given to pt and wife, verbalized understanding. RN offered wheelchair to visitors entrance, pt refused x3. Ambulated to entrance.

## 2017-12-27 NOTE — Progress Notes (Signed)
ANTICOAGULATION CONSULT NOTE - Initial Consult  Pharmacy Consult for full dose lovenox Indication: pulmonary embolus  Allergies  Allergen Reactions  . Penicillins Anaphylaxis and Other (See Comments)    Has patient had a PCN reaction causing immediate rash, facial/tongue/throat swelling, SOB or lightheadedness with hypotension: Unknown Has patient had a PCN reaction causing severe rash involving mucus membranes or skin necrosis: Unknown Has patient had a PCN reaction that required hospitalization: Unknown Has patient had a PCN reaction occurring within the last 10 years: No If all of the above answers are "NO", then may proceed with Cephalosporin use.     Patient Measurements: Height: 6\' 1"  (185.4 cm) Weight: 239 lb (108.4 kg) IBW/kg (Calculated) : 79.9  Vital Signs: Temp: 97.8 F (36.6 C) (10/24 0434) Temp Source: Oral (10/24 0434) BP: 161/80 (10/24 0434) Pulse Rate: 82 (10/24 0434)  Labs: Recent Labs    12/25/17 1106  12/25/17 2029 12/26/17 0628 12/26/17 2159 12/27/17 0432  HGB  --    < > 12.8* 11.9*  --  11.9*  HCT  --   --  39.6 38.4*  --  38.9*  PLT  --   --  294 263  --  259  APTT  --   --  30  --   --   --   LABPROT  --   --  12.5  --   --   --   INR  --   --  0.94  --   --   --   HEPARINUNFRC  --   --   --   --  0.46 0.58  CREATININE 1.50*  --  1.59* 1.57*  --   --   TROPONINI  --   --  <0.03  --   --   --    < > = values in this interval not displayed.    Estimated Creatinine Clearance: 60.6 mL/min (A) (by C-G formula based on SCr of 1.57 mg/dL (H)).   Medical History: Past Medical History:  Diagnosis Date  . Anxiety   . Arthritis   . 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 HOH (hard of hearing)   . Hypertension   . 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 Seasonal allergies   . Type II diabetes mellitus (HCC)    diet controlled    Medications:  Scheduled:  . aspirin EC  81 mg Oral Daily  . enoxaparin (LOVENOX) injection  1  mg/kg Subcutaneous Q12H  . insulin aspart  0-9 Units Subcutaneous Q6H  . pravastatin  40 mg Oral Daily    Assessment: Patient admitted for neck swelling and lymphadenopathy and was seen by ENT to r/o lymphoma and told to come in by MD office after CT showed bilateral PE's.  Patient is not on any anticoagulation PTA.  Goal of Therapy:  Monitor platelets by anticoagulation protocol: Yes   Plan:  Will discontinue heparin drip and restart patient on enoxaparin 110 mg every 12 hours.  Will monitor daily CBC's and renal function while on lovenox therapy.  Pernell Dupre, PharmD, BCPS Clinical Pharmacist 12/27/2017 9:06 AM

## 2017-12-27 NOTE — Progress Notes (Signed)
[name]   DOB:Apr 21, 1950   RK#:[id number]    Subjective: Patient had a biopsy yesterday afternoon.  States his breathing is improved.  Denies any worsening cough or chest pain.  Is walking the hallways.  He eager to go home.  Objective:  Vitals:   12/27/17 0434 12/27/17 1226  BP: (!) 161/80 126/67  Pulse: 82 77  Resp: 17 20  Temp: 97.8 F (36.6 C) 98 F (36.7 C)  SpO2: 97% 97%     Intake/Output Summary (Last 24 hours) at 12/27/2017 1751 Last data filed at 12/27/2017 1419 Gross per 24 hour  Intake 927.26 ml  Output 500 ml  Net 427.26 ml    GENERAL: Well-nourished well-developed; Alert, no distress and comfortable.    Accompanied by his wife. EYES: no pallor or icterus OROPHARYNX: no thrush or ulceration. NECK: supple,  LYMPH: Significant bulky palpable lymphadenopathy in the cervical, axillary and inguinal regions LUNGS: decreased breath sounds to auscultation at bases and  No wheeze or crackles HEART/CVS: regular rate & rhythm and no murmurs; No lower extremity edema ABDOMEN: abdomen soft, non-tender and normal bowel sounds Musculoskeletal:no cyanosis of digits and no clubbing  PSYCH: alert & oriented x 3 with fluent speech NEURO: no focal motor/sensory deficits SKIN:  no rashes or significant lesions    Labs:  Lab Results  Component Value Date   WBC 12.6 (H) 12/27/2017   HGB 11.9 (L) 12/27/2017   HCT 38.9 (L) 12/27/2017   MCV 87.4 12/27/2017   PLT 259 12/27/2017   NEUTROABS 3.9 12/27/2017    Lab Results  Component Value Date   NA 143 12/26/2017   K 4.1 12/26/2017   CL 107 12/26/2017   CO2 27 12/26/2017    Studies:  [place] Core Biopsy (lymph Nodes)  Result Date: 12/26/2017 CLINICAL DATA:  Extensive multilevel lymphadenopathy. No known primary. EXAM: ULTRASOUND GUIDED CORE BIOPSY OF LEFT INGUINAL ADENOPATHY MEDICATIONS: Intravenous Fentanyl and Versed were administered as conscious sedation during continuous monitoring of the patient's level of  consciousness and physiological / cardiorespiratory status by the radiology RN, with a total moderate sedation time of 11 minutes. PROCEDURE: The procedure, risks, benefits, and alternatives were explained to the patient. Questions regarding the procedure were encouraged and answered. The patient understands and consents to the procedure. Survey ultrasound of the left inguinal region performed. Adenopathy with nodes measured up to 5.7 cm were identified. An appropriate skin entry site was determined and marked. The operative field was prepped with chlorhexidine in a sterile fashion, and a sterile drape was applied covering the operative field. A sterile gown and sterile gloves were used for the procedure. Local anesthesia was provided with 1% Lidocaine. Under real-time ultrasound guidance, a 17 gauge trocar needle was advanced to the margin of the lesion. Once needle tip position was confirmed, coaxial 18-gauge core biopsy samples were obtained, submitted in saline to surgical pathology. The guide needle was removed. Postprocedure images show no hematoma or other apparent complication. The patient tolerated the procedure well. COMPLICATIONS: None. FINDINGS: Marked left inguinal adenopathy was confirmed. Representative core biopsy samples obtained as above IMPRESSION: 1. Technically successful ultrasound-guided core biopsy, left inguinal adenopathy Electronically Signed   By: Lucrezia Europe M.D.   On: 12/26/2017 14:41    Assessment & Plan:   #67 year old male patient admitted the hospital for worsening shortness of breath-noted to have PE; imaging shows multiple enlarged lymph nodes  #Multiple enlarged lymph nodes-bulky adenopathy-status post core biopsy of the left inguinal lymph node-preliminary pathology positive for  lymphoma-question SLL.  Final pathology pending.  Discussed that patient will benefit from chemotherapy/targeted therapy based upon final pathology.  If SLL-patient will need [place] mutation status.    #Elevated uric acid/slightly elevated creatinine 1.5-recommend allopurinol 300 twice daily; prescription sent.  Discussed regarding TLS prophylaxis.  #Bilateral PE acute-recommend Lovenox 1 mg/kg twice a day; patient should be able to pick up the prescription from the pharmacy this evening.  #Patient will need a PET scan; discussed the possible need for a bone marrow biopsy.  PET scan ordered outpatient; patient will follow-up with me in approximately week or so to review the pathology treatment plan.  # 40 minutes face-to-face with the patient discussing the above plan of care; more than 50% of time spent on prognosis/ natural history; counseling and coordination.  Discussed with the patient and family detail.  Also discussed with Dr. Posey Pronto.  Cammie Sickle, MD 12/27/2017  5:51 PM

## 2017-12-28 ENCOUNTER — Encounter: Payer: Self-pay | Admitting: Internal Medicine

## 2017-12-28 LAB — MULTIPLE MYELOMA PANEL, SERUM
Albumin SerPl Elph-Mcnc: 3.6 g/dL (ref 2.9–4.4)
Albumin/Glob SerPl: 1.4 (ref 0.7–1.7)
Alpha 1: 0.2 g/dL (ref 0.0–0.4)
Alpha2 Glob SerPl Elph-Mcnc: 0.7 g/dL (ref 0.4–1.0)
B-Globulin SerPl Elph-Mcnc: 0.9 g/dL (ref 0.7–1.3)
Gamma Glob SerPl Elph-Mcnc: 0.9 g/dL (ref 0.4–1.8)
Globulin, Total: 2.7 g/dL (ref 2.2–3.9)
IgA: 122 mg/dL (ref 61–437)
IgG (Immunoglobin G), Serum: 945 mg/dL (ref 700–1600)
IgM (Immunoglobulin M), Srm: 29 mg/dL (ref 20–172)
Total Protein ELP: 6.3 g/dL (ref 6.0–8.5)

## 2017-12-28 LAB — SURGICAL PATHOLOGY

## 2018-01-01 ENCOUNTER — Encounter
Admission: RE | Admit: 2018-01-01 | Discharge: 2018-01-01 | Disposition: A | Discharge status: Home / Self Care | Payer: Federal, State, Local not specified - PPO | Source: Ambulatory Visit | Attending: Internal Medicine | Admitting: Internal Medicine

## 2018-01-01 DIAGNOSIS — C8308 Small cell B-cell lymphoma, lymph nodes of multiple sites: Secondary | ICD-10-CM

## 2018-01-01 LAB — GLUCOSE, CAPILLARY: Glucose-Capillary: 133 mg/dL — ABNORMAL HIGH (ref 70–99)

## 2018-01-01 MED ORDER — FLUDEOXYGLUCOSE F - 18 (FDG) INJECTION
12.4000 | Freq: Once | INTRAVENOUS | Status: AC | PRN
  Administered 2018-01-01: 12.8 via INTRAVENOUS

## 2018-01-02 ENCOUNTER — Telehealth: Payer: Self-pay

## 2018-01-02 NOTE — Telephone Encounter (Signed)
EMMI flagged for sad, hopeless, anxious. CSW attempted to call patient to discuss concerns. Patient did not answer. CSW left voicemail. CSW will make second attempt tomorrow.   [place], [place]

## 2018-01-03 ENCOUNTER — Telehealth: Payer: Self-pay

## 2018-01-03 NOTE — Telephone Encounter (Signed)
CSW made second attempt at [facility] call follow up. CSW attempted to contact patient again but no one is available at phone number listed. CSW left another voicemail with CSW contact information.   [place], [place]

## 2018-01-04 ENCOUNTER — Telehealth: Payer: Self-pay | Admitting: Pharmacist

## 2018-01-04 ENCOUNTER — Inpatient Hospital Stay: Payer: Medicare Other

## 2018-01-04 ENCOUNTER — Inpatient Hospital Stay: Payer: Medicare Other | Attending: Internal Medicine | Admitting: Internal Medicine

## 2018-01-04 VITALS — BP 161/83 | HR 88 | Temp 97.8°F | Resp 16

## 2018-01-04 DIAGNOSIS — I2699 Other pulmonary embolism without acute cor pulmonale: Secondary | ICD-10-CM | POA: Insufficient documentation

## 2018-01-04 DIAGNOSIS — C8308 Small cell B-cell lymphoma, lymph nodes of multiple sites: Secondary | ICD-10-CM

## 2018-01-04 DIAGNOSIS — Z7984 Long term (current) use of oral hypoglycemic drugs: Secondary | ICD-10-CM

## 2018-01-04 DIAGNOSIS — Z87891 Personal history of nicotine dependence: Secondary | ICD-10-CM | POA: Insufficient documentation

## 2018-01-04 DIAGNOSIS — Z7901 Long term (current) use of anticoagulants: Secondary | ICD-10-CM

## 2018-01-04 DIAGNOSIS — E1122 Type 2 diabetes mellitus with diabetic chronic kidney disease: Secondary | ICD-10-CM | POA: Insufficient documentation

## 2018-01-04 DIAGNOSIS — N182 Chronic kidney disease, stage 2 (mild): Secondary | ICD-10-CM | POA: Diagnosis not present

## 2018-01-04 DIAGNOSIS — I129 Hypertensive chronic kidney disease with stage 1 through stage 4 chronic kidney disease, or unspecified chronic kidney disease: Secondary | ICD-10-CM | POA: Insufficient documentation

## 2018-01-04 DIAGNOSIS — Z79899 Other long term (current) drug therapy: Secondary | ICD-10-CM | POA: Insufficient documentation

## 2018-01-04 LAB — CBC WITH DIFFERENTIAL/PLATELET
Abs Immature Granulocytes: 0.03 10*3/uL (ref 0.00–0.07)
Basophils Absolute: 0.1 10*3/uL (ref 0.0–0.1)
Basophils Relative: 1 %
Eosinophils Absolute: 0.2 10*3/uL (ref 0.0–0.5)
Eosinophils Relative: 2 %
HCT: 39.6 % (ref 39.0–52.0)
Hemoglobin: 12.4 g/dL — ABNORMAL LOW (ref 13.0–17.0)
Immature Granulocytes: 0 %
Lymphocytes Relative: 52 %
Lymphs Abs: 5.8 10*3/uL — ABNORMAL HIGH (ref 0.7–4.0)
MCH: 26.5 pg (ref 26.0–34.0)
MCHC: 31.3 g/dL (ref 30.0–36.0)
MCV: 84.6 fL (ref 80.0–100.0)
Monocytes Absolute: 1.4 10*3/uL — ABNORMAL HIGH (ref 0.1–1.0)
Monocytes Relative: 13 %
Neutro Abs: 3.5 10*3/uL (ref 1.7–7.7)
Neutrophils Relative %: 32 %
Platelets: 254 10*3/uL (ref 150–400)
RBC: 4.68 MIL/uL (ref 4.22–5.81)
RDW: 13.7 % (ref 11.5–15.5)
Smear Review: ADEQUATE
WBC Morphology: 24
WBC: 11 10*3/uL — ABNORMAL HIGH (ref 4.0–10.5)
nRBC: 0 % (ref 0.0–0.2)

## 2018-01-04 LAB — BASIC METABOLIC PANEL
Anion gap: 7 (ref 5–15)
BUN: 38 mg/dL — ABNORMAL HIGH (ref 8–23)
CO2: 28 mmol/L (ref 22–32)
Calcium: 9.8 mg/dL (ref 8.9–10.3)
Chloride: 102 mmol/L (ref 98–111)
Creatinine, Ser: 1.43 mg/dL — ABNORMAL HIGH (ref 0.61–1.24)
GFR calc Af Amer: 58 mL/min — ABNORMAL LOW (ref >60)
GFR calc non Af Amer: 50 mL/min — ABNORMAL LOW (ref >60)
Glucose, Bld: 135 mg/dL — ABNORMAL HIGH (ref 70–99)
Potassium: 4.9 mmol/L (ref 3.5–5.1)
Sodium: 137 mmol/L (ref 135–145)

## 2018-01-04 LAB — LACTATE DEHYDROGENASE: LDH: 271 U/L — ABNORMAL HIGH (ref 98–192)

## 2018-01-04 MED ORDER — IBRUTINIB 420 MG PO TABS: 420.0000 mg | ORAL_TABLET | Freq: Every day | ORAL | 3 refills | Status: DC

## 2018-01-04 MED ORDER — ENOXAPARIN SODIUM 120 MG/0.8ML ~~LOC~~ SOLN: 1.0000 mg/kg | Freq: Two times a day (BID) | SUBCUTANEOUS | 0 refills | Status: DC

## 2018-01-04 NOTE — Assessment & Plan Note (Addendum)
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  Hold off bone marrow biopsy.  Ordered FISH testing on the peripheral blood/and also IGVH mutation status.  # Discussed multiple treatment options available for the patient-including but not limited to-Gazyva; ibrutinib; and also venetoclax-based treatments.  Await the results of our testing to start the treatment.  Discussed the treatments are usually to control the disease and not curative.  #Bilateral PE-on Lovenox; continue Lovenox.  New prescription sent.  #Hypertension-recommend close monitoring of blood pressure.  #Diabetes-well-controlled as per patient.  Monitor closely.  DISPOSITION:  #Labs today;  # Follow up in 2 weeks/labs- cbc/bmp Cc; Dr.Tate

## 2018-01-04 NOTE — Telephone Encounter (Signed)
Oral Oncology Pharmacist Encounter  Received new prescription for Imbruvica (ibrutinib) for the treatment of Small cell B-cell lymphoma, planned duration until disease progression or unacceptable drug toxicity.  CBC from 01/04/18 assessed, no relevant lab abnormalities. Prescription dose and frequency assessed.   Current medication list in Epic reviewed, a few DDIs with ibrutinib identified: - Echinacea should be used with caution, if at all, in patients receiving therapeutic immunosuppressants - Ibrutinib may enhance the adverse effect of medications with antiplatelet properties such as aspirin. Monitor CBC and s/sx of bleeding. - Ibrutinib may enhance the adverse effect of anticoagulants like enoxaparin. Monitor CBC and s/sx of bleeding. -Omega-3 Fatty Acids may enhance the antiplatelet effect of Ibrutinib. Monitor CBC and s/sx of bleeding.  Prescription has been e-scribed to the [facility] for benefits analysis and approval.  Oral Oncology Clinic will continue to follow for insurance authorization, copayment issues, initial counseling and start date.  Darl Pikes, PharmD, BCPS, [facility] Hematology/Oncology Clinical Pharmacist [facility] [phone]  01/04/2018 4:12 PM

## 2018-01-04 NOTE — Progress Notes (Signed)
[place] NOTE  Patient Care Team: [name], MD as PCP - General (Internal Medicine)  CHIEF COMPLAINTS/PURPOSE OF CONSULTATION:  SMALL CELL LYMPHOMA  #  Oncology History   # October 2019- SMALL CELL LYMPHOMA; ki-67-10%. STAGE- III/ IV; PET scan- bulky LN above/below Diaphragm; No spleen/liver/bone; FISH/[facility] pending.   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 [name] 67 y.o.  male was recently admitted to the hospital for diagnosis of PE.  Patient was recently referred to ENT for swelling of his under the neck lymph nodes.  As part of the work-up patient had a CT of the chest and pelvis that showed incidental bilateral significant PE which led to the hospital admission.  In the hospital patient had a core biopsy of the right inguinal lymph node.  Patient is here to discuss the recent PET scan.  Patient continues to be Lovenox.  Denies any blood in stools black or stools.  Breathing is improved.   Review of Systems  Constitutional: Negative for chills, diaphoresis, fever, malaise/fatigue and weight loss.  HENT: Negative for nosebleeds and sore throat.   Eyes: Negative for double vision.  Respiratory: Negative for cough, hemoptysis, sputum production, shortness of breath and wheezing.   Cardiovascular: Negative for chest pain, palpitations, orthopnea and leg swelling.  Gastrointestinal: Negative for abdominal pain, blood in stool, constipation, diarrhea, heartburn, melena, nausea and vomiting.  Genitourinary: Negative for dysuria, frequency and urgency.  Musculoskeletal: Negative for back  pain and joint pain.  Skin: Negative.  Negative for itching and rash.  Neurological: Negative for dizziness, tingling, focal weakness, weakness and headaches.  Endo/Heme/Allergies: Does not bruise/bleed easily.  Psychiatric/Behavioral: Negative for depression. The patient is not nervous/anxious and does not have insomnia.      MEDICAL HISTORY:  Past Medical History:  Diagnosis Date  . Anxiety   . Arthritis   . 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 HOH (hard of hearing)   . Hypertension   . Osteomyelitis of toe ([place]) 06/08/11   right foot  . Seasonal allergies   . Type II diabetes mellitus ([place])    diet controlled    SURGICAL HISTORY: Past Surgical History:  Procedure Laterality Date  . AMPUTATION  06/08/2011   Procedure: AMPUTATION RAY;  Surgeon: Wylene Simmer, MD;  Location: [facility];  Service: Orthopedics;  Laterality: Right;  [place] Amputation  . [employer]   "crushed"  . FRACTURE SURGERY    . 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 PILONIDAL CYST / SINUS EXCISION  1970's  . TOE AMPUTATION  06/08/11   partial; right great toe    SOCIAL HISTORY: Social History   Socioeconomic History  . Marital status: Married    Spouse name: Not on file  . Number of children: Not on file  . Years of education: Not on file  . Highest education level: Not on file  Occupational History  . Not on file  Social Needs  . Financial  resource strain: Not on file  . Food insecurity:    Worry: Not on file    Inability: Not on file  . Transportation needs:    Medical: Not on file    Non-medical: Not on file  Tobacco Use  . Smoking status: Former Smoker    Packs/day: 1.50    Years: 22.00    Pack years: 33.00    Types: Cigarettes    Last attempt to quit: 05/24/2011    Years since quitting: 6.6  . Smokeless tobacco: Former Systems developer    Types: [place] date: 03/07/1983  Substance and Sexual Activity  . Alcohol use: Yes    Comment: 06/08/11 "no liquor for 2 1/2  years; last beer 05/31/11"  . Drug use: No  . Sexual activity: Yes  Lifestyle  . Physical activity:    Days per week: Not on file    Minutes per session: Not on file  . Stress: Not on file  Relationships  . Social connections:    Talks on phone: Not on file    Gets together: Not on file    Attends religious service: Not on file    Active member of club or organization: Not on file    Attends meetings of clubs or organizations: Not on file    Relationship status: Not on file  . Intimate partner violence:    Fear of current or ex partner: Not on file    Emotionally abused: Not on file    Physically abused: Not on file    Forced sexual activity: Not on file  Other Topics Concern  . Not on file  Social History Narrative   Married    FAMILY HISTORY: Family History  Problem Relation Age of Onset  . Prostate cancer Father   . Hypertension Father   . Other Mother        VARICOSE VEINS    ALLERGIES:  is allergic to penicillins.  MEDICATIONS:  Current Outpatient Medications  Medication Sig Dispense Refill  . allopurinol (ZYLOPRIM) 300 MG tablet Take 1 tablet (300 mg total) by mouth 2 (two) times daily. 120 tablet 3  . aspirin EC 81 MG tablet Take 81 mg by mouth daily.    Marland Kitchen ECHINACEA PO Take 760 mg by mouth daily.    Marland Kitchen enoxaparin (LOVENOX) 120 MG/0.8ML injection Inject 0.72 mLs (110 mg total) into the skin every 12 (twelve) hours. 43.2 mL 0  . fluticasone (FLONASE) 50 MCG/ACT nasal spray Place 2 sprays into the nose daily. 16 g 11  . fluticasone (FLONASE) 50 MCG/ACT nasal spray Place 2 sprays into both nostrils daily as needed for allergies or rhinitis.    . Ginseng 100 MG CAPS Take 100 mg by mouth daily.    . hydrochlorothiazide (MICROZIDE) 12.5 MG capsule Take 12.5 mg by mouth daily.    . Ibrutinib 420 MG TABS Take 420 mg by mouth daily. 28 tablet 3  . JANUVIA 100 MG tablet Take 100 mg by mouth daily.  3  . lisinopril (PRINIVIL,ZESTRIL) 40 MG tablet Take 40 mg by mouth daily.     . metFORMIN (GLUCOPHAGE) 500 MG tablet Take 1,000 mg by mouth 2 (two) times daily.    . Omega-3 Fatty Acids (OMEGA 3 500) 500 MG CAPS Take 500 mg by mouth daily.    . pravastatin (PRAVACHOL) 40 MG tablet Take 40 mg by mouth daily.    . Saw Palmetto 450 MG CAPS Take 450 mg by mouth daily.  No current facility-administered medications for this visit.       Marland Kitchen  PHYSICAL EXAMINATION: ECOG PERFORMANCE STATUS: 1 - Symptomatic but completely ambulatory  Vitals:   01/04/18 1325  BP: (!) 161/83  Pulse: 88  Resp: 16  Temp: 97.8 F (36.6 C)   There were no vitals filed for this visit.  Physical Exam  Constitutional: He is oriented to person, place, and time and well-developed, well-nourished, and in no distress.  Accompanied by his wife.  Bilateral hard of hearing.  HENT:  Head: Normocephalic and atraumatic.  Mouth/Throat: Oropharynx is clear and moist. No oropharyngeal exudate.  Eyes: Pupils are equal, round, and reactive to light.  Neck: Normal range of motion. Neck supple.  Cardiovascular: Normal rate and regular rhythm.  Pulmonary/Chest: No respiratory distress. He has no wheezes.  Abdominal: Soft. Bowel sounds are normal. He exhibits no distension and no mass. There is no tenderness. There is no rebound and no guarding.  Musculoskeletal: Normal range of motion. He exhibits no edema or tenderness.  Lymphadenopathy:  Bilateral bulky adenopathy in the neck underarms groins.  Neurological: He is alert and oriented to person, place, and time.  Skin: Skin is warm.  Psychiatric: Affect normal.     LABORATORY DATA:  I have reviewed the data as listed Lab Results  Component Value Date   WBC 11.0 (H) 01/04/2018   HGB 12.4 (L) 01/04/2018   HCT 39.6 01/04/2018   MCV 84.6 01/04/2018   PLT 254 01/04/2018   Recent Labs    12/25/17 2029 12/26/17 0628 12/26/17 1000 01/04/18 1409  NA 139 143  --  137  K 4.5 4.1  --  4.9  CL 104 107  --  102  CO2 22 27  --  28  GLUCOSE  189* 143*  --  135*  BUN 37* 40*  --  38*  CREATININE 1.59* 1.57*  --  1.43*  CALCIUM 9.3 8.8*  --  9.8  GFRNONAA 44* 45*  --  50*  GFRAA 51* 52*  --  58*  PROT  --   --  6.7  --   ALBUMIN  --   --  4.2  --   AST  --   --  17  --   ALT  --   --  14  --   ALKPHOS  --   --  63  --   BILITOT  --   --  0.6  --   BILIDIR  --   --  0.1  --   IBILI  --   --  0.5  --     RADIOGRAPHIC STUDIES: I have personally reviewed the radiological images as listed and agreed with the findings in the report. Ct Soft Tissue Neck W Contrast  Result Date: 12/25/2017 CLINICAL DATA:  Bilateral submandibular swelling. EXAM: CT NECK WITH CONTRAST TECHNIQUE: Multidetector CT imaging of the neck was performed using the standard protocol following the bolus administration of intravenous contrast. CONTRAST:  77m ISOVUE-300 IOPAMIDOL (ISOVUE-300) INJECTION 61% COMPARISON:  None. FINDINGS: Pharynx and larynx: No thickening of Waldeyer's ring. Salivary glands: Masses in the bilateral parotids are attributed to lymph nodes. The submandibular glands are distorted by submandibular adenopathy. Thyroid: Negative Lymph nodes: 5 bulky and diffuse homogeneously enhancing lymph nodes throughout the bilateral neck, supraclavicular fossa, and axillae. 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 Visualized orbits: Negative Mastoids and visualized paranasal sinuses: Clear Skeleton: Bulky spondylosis and degenerative facet spurring. Upper chest:  Negative IMPRESSION: Bulky and generalized cervical and upper thoracic adenopathy that is most consistent with lymphoma. Electronically Signed   By: Monte Fantasia M.D.   On: 12/25/2017 14:23   Ct Chest W Contrast  Result Date: 12/25/2017 CLINICAL DATA:  Bilateral neck and axillary adenopathy EXAM: CT CHEST, ABDOMEN, AND PELVIS WITH CONTRAST TECHNIQUE: Multidetector CT imaging of the chest, abdomen and pelvis was performed following the  standard protocol during bolus administration of intravenous contrast. CONTRAST:  10m ISOVUE-300 IOPAMIDOL (ISOVUE-300) INJECTION 61% COMPARISON:  None. FINDINGS: CT CHEST FINDINGS Cardiovascular: The heart is normal in size. No pericardial effusion. The aorta is normal in caliber. Scattered atherosclerotic calcifications. No dissection. The branch vessels are patent. Dense coronary artery calcifications. Bilateral fairly significant pulmonary emboli are noted. Mediastinum/Nodes: Bulky supraclavicular and axillary lymphadenopathy. Index nodal mass in the left supraclavicular region on image number 1 measures 3.6 cm. Right-sided supraclavicular node on image number 1 measures 2.6 cm. Index right axillary lymph node on image number 13 measures 4.4 cm. Index node in the left axilla on image number 13 measures 4.7 cm. Right paratracheal lymph node on image number 20 measures 16 mm and left paratracheal/AP window node on image number 22 measures 19 mm. The esophagus is grossly normal. Lungs/Pleura: No acute pulmonary findings. No worrisome pulmonary lesions. No pleural effusions or pleural nodules. Musculoskeletal: No significant bony findings. Moderate degenerative changes involving the spine. CT ABDOMEN PELVIS FINDINGS Hepatobiliary: No focal hepatic lesions or intrahepatic biliary dilatation. The gallbladder is normal. Pancreas: No mass, inflammation or ductal dilatation. Spleen: Moderate splenomegaly. The spleen measures 15.5 x 15.0 x 11.5 cm. No focal lesions. Adrenals/Urinary Tract: The adrenal glands and kidneys are unremarkable. Stomach/Bowel: The stomach, duodenum, small bowel and colon are grossly normal. No acute inflammatory changes, mass lesions or obstructive findings. The terminal ileum and appendix are normal. Vascular/Lymphatic: Extensive lymphadenopathy in the abdomen and pelvis. Cluster of retroperitoneal lymph nodes on image number 78 measures 10.6 x 5.4 cm and surrounds the aorta. 3.3 cm of celiac  axis lymph node on image number 61. Numerous mesenteric lymph nodes. Bulky left periaortic nodal mass continues down into the pelvis. Index node on image number 95 measures 5 cm. Extensive pelvic adenopathy. Bulky operator region lymph nodes have significant mass effect in the midline on the sigmoid colon and especially the bladder which is markedly compressed. The right nodal mass measures 6.7 cm and the left nodal mass measures 6.7 cm. Extensive inguinal lymphadenopathy. Index node on the left measures 4.9 cm on image number 122 and index node on the right measures 3.9 cm on image number 126. Extensive atherosclerotic calcifications involving the aorta and iliac arteries. Reproductive: The prostate gland and seminal vesicles are grossly normal. Other: No free pelvic fluid collections. Musculoskeletal: No significant bony findings. IMPRESSION: 1. Fairly significant bilateral pulmonary emboli. 2. Bulky supraclavicular, axillary, mediastinal, mesenteric, retroperitoneal, pelvic and inguinal lymphadenopathy most consistent with lymphoma. 3. Splenomegaly without focal splenic lesions. 4. Bulky pelvic adenopathy is compressing the bladder and sigmoid colon. These results were called by telephone at the time of interpretation on 12/25/2017 at 5:00 pm to Dr. CCarloyn Manner, who verbally acknowledged these results. Electronically Signed   By: PMarijo SanesM.D.   On: 12/25/2017 17:01   Ct Abdomen Pelvis W Contrast  Result Date: 12/25/2017 CLINICAL DATA:  Bilateral neck and axillary adenopathy EXAM: CT CHEST, ABDOMEN, AND PELVIS WITH CONTRAST TECHNIQUE: Multidetector CT imaging of the chest, abdomen and pelvis was performed following the standard protocol during  bolus administration of intravenous contrast. CONTRAST:  80m ISOVUE-300 IOPAMIDOL (ISOVUE-300) INJECTION 61% COMPARISON:  None. FINDINGS: CT CHEST FINDINGS Cardiovascular: The heart is normal in size. No pericardial effusion. The aorta is normal in caliber.  Scattered atherosclerotic calcifications. No dissection. The branch vessels are patent. Dense coronary artery calcifications. Bilateral fairly significant pulmonary emboli are noted. Mediastinum/Nodes: Bulky supraclavicular and axillary lymphadenopathy. Index nodal mass in the left supraclavicular region on image number 1 measures 3.6 cm. Right-sided supraclavicular node on image number 1 measures 2.6 cm. Index right axillary lymph node on image number 13 measures 4.4 cm. Index node in the left axilla on image number 13 measures 4.7 cm. Right paratracheal lymph node on image number 20 measures 16 mm and left paratracheal/AP window node on image number 22 measures 19 mm. The esophagus is grossly normal. Lungs/Pleura: No acute pulmonary findings. No worrisome pulmonary lesions. No pleural effusions or pleural nodules. Musculoskeletal: No significant bony findings. Moderate degenerative changes involving the spine. CT ABDOMEN PELVIS FINDINGS Hepatobiliary: No focal hepatic lesions or intrahepatic biliary dilatation. The gallbladder is normal. Pancreas: No mass, inflammation or ductal dilatation. Spleen: Moderate splenomegaly. The spleen measures 15.5 x 15.0 x 11.5 cm. No focal lesions. Adrenals/Urinary Tract: The adrenal glands and kidneys are unremarkable. Stomach/Bowel: The stomach, duodenum, small bowel and colon are grossly normal. No acute inflammatory changes, mass lesions or obstructive findings. The terminal ileum and appendix are normal. Vascular/Lymphatic: Extensive lymphadenopathy in the abdomen and pelvis. Cluster of retroperitoneal lymph nodes on image number 78 measures 10.6 x 5.4 cm and surrounds the aorta. 3.3 cm of celiac axis lymph node on image number 61. Numerous mesenteric lymph nodes. Bulky left periaortic nodal mass continues down into the pelvis. Index node on image number 95 measures 5 cm. Extensive pelvic adenopathy. Bulky operator region lymph nodes have significant mass effect in the midline  on the sigmoid colon and especially the bladder which is markedly compressed. The right nodal mass measures 6.7 cm and the left nodal mass measures 6.7 cm. Extensive inguinal lymphadenopathy. Index node on the left measures 4.9 cm on image number 122 and index node on the right measures 3.9 cm on image number 126. Extensive atherosclerotic calcifications involving the aorta and iliac arteries. Reproductive: The prostate gland and seminal vesicles are grossly normal. Other: No free pelvic fluid collections. Musculoskeletal: No significant bony findings. IMPRESSION: 1. Fairly significant bilateral pulmonary emboli. 2. Bulky supraclavicular, axillary, mediastinal, mesenteric, retroperitoneal, pelvic and inguinal lymphadenopathy most consistent with lymphoma. 3. Splenomegaly without focal splenic lesions. 4. Bulky pelvic adenopathy is compressing the bladder and sigmoid colon. These results were called by telephone at the time of interpretation on 12/25/2017 at 5:00 pm to Dr. CCarloyn Manner, who verbally acknowledged these results. Electronically Signed   By: PMarijo SanesM.D.   On: 12/25/2017 17:01   Nm Pet Image Initial (pi) Skull Base To Thigh  Result Date: 01/01/2018 CLINICAL DATA:  Initial treatment strategy for newly diagnosed B-cell lymphoma. EXAM: NUCLEAR MEDICINE PET SKULL BASE TO THIGH TECHNIQUE: 12.8 mCi F-18 FDG was injected intravenously. Full-ring PET imaging was performed from the skull base to thigh after the radiotracer. CT data was obtained and used for attenuation correction and anatomic localization. Fasting blood glucose: 133 mg/dl COMPARISON:  CT chest abdomen pelvis dated 12/25/2017 FINDINGS: Mediastinal blood pool activity: SUV max 1.98 NECK: Bulky bilateral cervical lymphadenopathy. Index 5.8 cm short axis left level 2 node (series 3/image 48), max SUV 4.8. Incidental CT findings: none CHEST: Bulky bilateral supraclavicular  and axillary lymphadenopathy. Index 4.8 cm short axis right  axillary node (series 3/image 92), max SUV 4.5. Small mediastinal lymph nodes, including a 2.2 cm short axis AP window node (series 3/image 95), max SUV 3.9. No suspicious pulmonary nodules. Incidental CT findings: Mild atherosclerotic calcifications of the aortic arch. Three vessel coronary atherosclerosis. ABDOMEN/PELVIS: Mild upper abdominal lymphadenopathy, including a 2.3 cm short axis peripancreatic node (series 3/image 163), max SUV 5.0. Bulky para-aortic lymphadenopathy, including a 3.2 cm left para-aortic node (series 3/image 190), max SUV 4.7. Additional bulky retroperitoneal lymphadenopathy, including a 5.2 cm short axis lower left para-aortic node (series 3/image 216), max SUV 14.5. Bulky bilateral pelvic lymphadenopathy, including a 6.6 cm short axis right external iliac node (series 3/image 263) with max SUV 5.0 and a 4.8 cm short axis left inguinal node (series 3/image 263) with max SUV 6.7. Mild splenomegaly. No abnormal hypermetabolism of the liver, pancreas, or adrenal glands. Incidental CT findings: Atherosclerotic calcifications the abdominal aorta and branch vessels. Thick-walled bladder, although underdistended. Small fat containing right inguinal hernia. SKELETON: No focal hypermetabolic activity to suggest skeletal metastasis. Incidental CT findings: Degenerative changes of the visualized thoracolumbar spine. IMPRESSION: Bulky cervical, thoracic, and abdominopelvic lymphadenopathy, corresponding to known lymphoma, with index lesions as above. Mild splenomegaly. Electronically Signed   By: Julian Hy M.D.   On: 01/01/2018 15:48   [place] Core Biopsy (lymph Nodes)  Result Date: 12/26/2017 CLINICAL DATA:  Extensive multilevel lymphadenopathy. No known primary. EXAM: ULTRASOUND GUIDED CORE BIOPSY OF LEFT INGUINAL ADENOPATHY MEDICATIONS: Intravenous Fentanyl and Versed were administered as conscious sedation during continuous monitoring of the patient's level of consciousness and  physiological / cardiorespiratory status by the radiology RN, with a total moderate sedation time of 11 minutes. PROCEDURE: The procedure, risks, benefits, and alternatives were explained to the patient. Questions regarding the procedure were encouraged and answered. The patient understands and consents to the procedure. Survey ultrasound of the left inguinal region performed. Adenopathy with nodes measured up to 5.7 cm were identified. An appropriate skin entry site was determined and marked. The operative field was prepped with chlorhexidine in a sterile fashion, and a sterile drape was applied covering the operative field. A sterile gown and sterile gloves were used for the procedure. Local anesthesia was provided with 1% Lidocaine. Under real-time ultrasound guidance, a 17 gauge trocar needle was advanced to the margin of the lesion. Once needle tip position was confirmed, coaxial 18-gauge core biopsy samples were obtained, submitted in saline to surgical pathology. The guide needle was removed. Postprocedure images show no hematoma or other apparent complication. The patient tolerated the procedure well. COMPLICATIONS: None. FINDINGS: Marked left inguinal adenopathy was confirmed. Representative core biopsy samples obtained as above IMPRESSION: 1. Technically successful ultrasound-guided core biopsy, left inguinal adenopathy Electronically Signed   By: Lucrezia Europe M.D.   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  Hold off bone marrow biopsy.  Ordered FISH testing on the peripheral blood/and also IGVH mutation status.  # Discussed multiple treatment options available for the patient-including but not limited to-Gazyva; ibrutinib; and also venetoclax-based treatments.  Await the results of our testing to start the treatment.  Discussed the treatments are usually  to control the disease and not curative.  #Bilateral PE-on Lovenox; continue Lovenox.  New prescription sent.  #Hypertension-recommend close monitoring of blood pressure.  #  Diabetes-well-controlled as per patient.  Monitor closely.  DISPOSITION:  #Labs today;  # Follow up in 2 weeks/labs- cbc/bmp Cc; Dr.Tate    All questions were answered. The patient knows to call the clinic with any problems, questions or concerns.       Cammie Sickle, MD 01/13/2018 6:07 PM

## 2018-01-07 ENCOUNTER — Telehealth: Payer: Self-pay | Admitting: Pharmacy Technician

## 2018-01-07 LAB — IMMUNOGLOBULINS A/E/G/M, SERUM
IgA: 118 mg/dL (ref 61–437)
IgE (Immunoglobulin E), Serum: <2 IU/mL — ABNORMAL LOW (ref 6–495)
IgG (Immunoglobin G), Serum: 938 mg/dL (ref 700–1600)
IgM (Immunoglobulin M), Srm: 28 mg/dL (ref 20–172)

## 2018-01-07 MED ORDER — IBRUTINIB 420 MG PO TABS: 420.0000 mg | ORAL_TABLET | Freq: Every day | ORAL | 3 refills | Status: DC

## 2018-01-07 MED FILL — IMBRUVICA 420 MG TAB: 420 | 28 days supply | Qty: 28 | Fill #0

## 2018-01-07 NOTE — Telephone Encounter (Signed)
Oral Oncology Patient Advocate Encounter  Received notification from [employer] that prior authorization for Imbruvica is required.  PA submitted on CoverMyMeds Key [id number]  Status is pending  Oral Oncology Clinic will continue to follow.

## 2018-01-07 NOTE — Telephone Encounter (Signed)
Oral Chemotherapy Pharmacist Encounter  Dr. Rogue Bussing would like for [name] to start the medication as soon as he receives it. Expected delivery 01/08/18. Patient instructed to begin medication when it is received.   Patient Education I spoke with patient's wife [name] for overview of new oral chemotherapy medication: Imbruvica (ibrutinib) for the treatment of Small cell B-cell lymphoma, planned duration until disease progression or unacceptable drug toxicity.   Counseled [name] on administration, dosing, side effects, monitoring, drug-food interactions, safe handling, storage, and disposal. Patient will take 420 mg by mouth daily.  Side effects include but not limited to: decreased wbc/hgb/plt, N/V/D, fatigue.    Reviewed with patient importance of keeping a medication schedule and plan for any missed doses.  [name] voiced understanding and appreciation. All questions answered. Medication handout placed in the mail.  Provided patient with [facility] phone number. Patient knows to call the office with questions or concerns. Oral Chemotherapy Navigation Clinic will continue to follow.  [name], PharmD, BCPS, [facility] Hematology/Oncology Clinical Pharmacist [facility] [phone]  01/07/2018 2:15 PM

## 2018-01-07 NOTE — Telephone Encounter (Signed)
Oral Oncology Patient Advocate Encounter  Prior Authorization for [name] has been approved.    PA# [id number] Effective dates: 12/08/17 through 01/07/19  Patients co-pay is $50.00. I have obtained a copay card to reduce the monthly copay to $10.  Oral Oncology Clinic will continue to follow.   [place] Patient [place] Phone [phone] Fax [phone] 01/07/2018 10:34 AM

## 2018-01-07 NOTE — Telephone Encounter (Signed)
Oral Oncology Patient Advocate Encounter   Was successful in obtaining a copay card for [name].  This copay card will make the patients copay $10.  I have spoken with the patient.    The billing information is as follows and has been shared with the [name].  RxBin: [id number] Member ID: [id number] Group ID: [id number]   [name] [name] Patient [name] Phone [phone] Fax [phone] 01/07/2018 10:35 AM

## 2018-01-08 NOTE — Telephone Encounter (Signed)
Spoke to patient today. He did receive his medication and took the first dose today.

## 2018-01-08 NOTE — Telephone Encounter (Addendum)
[facility] shipped medication 11/4.  Patient should receive 11/5.  [place] Patient [place] Phone [phone] Fax [phone] 01/08/2018 8:13 AM

## 2018-01-11 LAB — MISC LABCORP TEST (SEND OUT): Labcorp test code: 113753

## 2018-01-18 ENCOUNTER — Inpatient Hospital Stay (HOSPITAL_BASED_OUTPATIENT_CLINIC_OR_DEPARTMENT_OTHER): Payer: Medicare Other | Admitting: Internal Medicine

## 2018-01-18 ENCOUNTER — Other Ambulatory Visit: Payer: Self-pay

## 2018-01-18 ENCOUNTER — Inpatient Hospital Stay: Payer: Medicare Other

## 2018-01-18 VITALS — BP 137/67 | HR 80 | Temp 98.3°F | Wt 214.0 lb

## 2018-01-18 DIAGNOSIS — R040 Epistaxis: Secondary | ICD-10-CM

## 2018-01-18 DIAGNOSIS — E1122 Type 2 diabetes mellitus with diabetic chronic kidney disease: Secondary | ICD-10-CM | POA: Diagnosis not present

## 2018-01-18 DIAGNOSIS — Z87891 Personal history of nicotine dependence: Secondary | ICD-10-CM

## 2018-01-18 DIAGNOSIS — I2699 Other pulmonary embolism without acute cor pulmonale: Secondary | ICD-10-CM

## 2018-01-18 DIAGNOSIS — C8308 Small cell B-cell lymphoma, lymph nodes of multiple sites: Secondary | ICD-10-CM

## 2018-01-18 DIAGNOSIS — I129 Hypertensive chronic kidney disease with stage 1 through stage 4 chronic kidney disease, or unspecified chronic kidney disease: Secondary | ICD-10-CM | POA: Diagnosis not present

## 2018-01-18 DIAGNOSIS — N183 Chronic kidney disease, stage 3 (moderate): Secondary | ICD-10-CM

## 2018-01-18 LAB — CBC WITH DIFFERENTIAL/PLATELET
Abs Immature Granulocytes: 0.03 10*3/uL (ref 0.00–0.07)
Basophils Absolute: 0.2 10*3/uL — ABNORMAL HIGH (ref 0.0–0.1)
Basophils Relative: 2 %
Eosinophils Absolute: 0.5 10*3/uL (ref 0.0–0.5)
Eosinophils Relative: 4 %
HCT: 37.6 % — ABNORMAL LOW (ref 39.0–52.0)
Hemoglobin: 12.1 g/dL — ABNORMAL LOW (ref 13.0–17.0)
Immature Granulocytes: 0 %
Lymphocytes Relative: 61 %
Lymphs Abs: 9.2 10*3/uL — ABNORMAL HIGH (ref 0.7–4.0)
MCH: 27.1 pg (ref 26.0–34.0)
MCHC: 32.2 g/dL (ref 30.0–36.0)
MCV: 84.3 fL (ref 80.0–100.0)
Monocytes Absolute: 0.5 10*3/uL (ref 0.1–1.0)
Monocytes Relative: 3 %
Neutro Abs: 4.4 10*3/uL (ref 1.7–7.7)
Neutrophils Relative %: 30 %
Platelets: 353 10*3/uL (ref 150–400)
RBC: 4.46 MIL/uL (ref 4.22–5.81)
RDW: 13.7 % (ref 11.5–15.5)
WBC Morphology: ABNORMAL
WBC: 14.8 10*3/uL — ABNORMAL HIGH (ref 4.0–10.5)
nRBC: 0 % (ref 0.0–0.2)

## 2018-01-18 LAB — BASIC METABOLIC PANEL
Anion gap: 8 (ref 5–15)
BUN: 54 mg/dL — ABNORMAL HIGH (ref 8–23)
CO2: 25 mmol/L (ref 22–32)
Calcium: 9.5 mg/dL (ref 8.9–10.3)
Chloride: 103 mmol/L (ref 98–111)
Creatinine, Ser: 1.75 mg/dL — ABNORMAL HIGH (ref 0.61–1.24)
GFR calc Af Amer: 45 mL/min — ABNORMAL LOW (ref >60)
GFR calc non Af Amer: 39 mL/min — ABNORMAL LOW (ref >60)
Glucose, Bld: 203 mg/dL — ABNORMAL HIGH (ref 70–99)
Potassium: 5 mmol/L (ref 3.5–5.1)
Sodium: 136 mmol/L (ref 135–145)

## 2018-01-18 LAB — PHOSPHORUS: Phosphorus: 5.1 mg/dL — ABNORMAL HIGH (ref 2.5–4.6)

## 2018-01-18 LAB — FISH HES LEUKEMIA, 4Q12 REA

## 2018-01-18 LAB — MAGNESIUM: Magnesium: 2.2 mg/dL (ref 1.7–2.4)

## 2018-01-18 LAB — URIC ACID: Uric Acid, Serum: 4.9 mg/dL (ref 3.7–8.6)

## 2018-01-18 LAB — LACTATE DEHYDROGENASE: LDH: 114 U/L (ref 98–192)

## 2018-01-18 NOTE — Progress Notes (Signed)
[place] NOTE  Patient Care Team: [name], MD as PCP - General (Internal Medicine)  CHIEF COMPLAINTS/PURPOSE OF CONSULTATION:  SMALL CELL LYMPHOMA  #  Oncology History   # October 2019- SMALL CELL LYMPHOMA; ki-67-10%. 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 [name] 66 y.o.  male above new diagnosis of SLL/CLL on ibrutinib; incidental bilateral PE-Lovenox is here for follow-up.  Patient noted to have improvement of his neck adenopathy; also underarm lymph nodes.   He continues to be Lovenox.  Complains of pain at the site of injection.  However breathing improved.  Does admit to intermittent nosebleeds x2.  No blood in stools or black or stools.   Review of Systems  Constitutional: Negative for chills, diaphoresis, fever, malaise/fatigue and weight loss.  HENT: Positive for nosebleeds. Negative for sore throat.   Eyes: Negative for double vision.  Respiratory: Negative for cough, hemoptysis, sputum production, shortness of breath and wheezing.   Cardiovascular: Negative for chest pain, palpitations, orthopnea and leg swelling.  Gastrointestinal: Positive for nausea and vomiting. Negative for abdominal pain, blood in stool, constipation, diarrhea, heartburn and melena.  Genitourinary: Negative for dysuria, frequency and urgency.  Musculoskeletal: Negative for back pain  and joint pain.  Skin: Negative.  Negative for itching and rash.  Neurological: Negative for dizziness, tingling, focal weakness, weakness and headaches.  Endo/Heme/Allergies: Does not bruise/bleed easily.  Psychiatric/Behavioral: Negative for depression. The patient is not nervous/anxious and does not have insomnia.      MEDICAL HISTORY:  Past Medical History:  Diagnosis Date  . Anxiety   . Arthritis   . 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 HOH (hard of hearing)   . Hypertension   . Osteomyelitis of toe ([place]) 06/08/11   right foot  . Seasonal allergies   . Type II diabetes mellitus ([place])    diet controlled    SURGICAL HISTORY: Past Surgical History:  Procedure Laterality Date  . AMPUTATION  06/08/2011   Procedure: AMPUTATION RAY;  Surgeon: Wylene Simmer, MD;  Location: [facility];  Service: Orthopedics;  Laterality: Right;  Righ Hallux Amputation  . [place]   "crushed"  . FRACTURE SURGERY    . 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 PILONIDAL CYST / SINUS EXCISION  1970's  . TOE AMPUTATION  06/08/11   partial; right great toe    SOCIAL HISTORY: Social History   Socioeconomic History  . Marital status: Married    Spouse name: Not on file  . Number of children: Not on file  . Years of education: Not on file  . Highest education level: Not on file  Occupational History  . Not on file  Social Needs  . Financial resource strain: Not on file  . Food insecurity:    Worry: Not on file  Inability: Not on file  . Transportation needs:    Medical: Not on file    Non-medical: Not on file  Tobacco Use  . Smoking status: Former Smoker    Packs/day: 1.50    Years: 22.00    Pack years: 33.00    Types: Cigarettes    Last attempt to quit: 05/24/2011    Years since quitting: 6.6  . Smokeless tobacco: Former Systems developer    Types: [place] date: 03/07/1983  Substance and Sexual Activity  . Alcohol use: Yes    Comment: 06/08/11 "no liquor for 2 1/2 years;  last beer 05/31/11"  . Drug use: No  . Sexual activity: Yes  Lifestyle  . Physical activity:    Days per week: Not on file    Minutes per session: Not on file  . Stress: Not on file  Relationships  . Social connections:    Talks on phone: Not on file    Gets together: Not on file    Attends religious service: Not on file    Active member of club or organization: Not on file    Attends meetings of clubs or organizations: Not on file    Relationship status: Not on file  . Intimate partner violence:    Fear of current or ex partner: Not on file    Emotionally abused: Not on file    Physically abused: Not on file    Forced sexual activity: Not on file  Other Topics Concern  . Not on file  Social History Narrative   Married    FAMILY HISTORY: Family History  Problem Relation Age of Onset  . Prostate cancer Father   . Hypertension Father   . Other Mother        VARICOSE VEINS    ALLERGIES:  is allergic to penicillins.  MEDICATIONS:  Current Outpatient Medications  Medication Sig Dispense Refill  . allopurinol (ZYLOPRIM) 300 MG tablet Take 1 tablet (300 mg total) by mouth 2 (two) times daily. 120 tablet 3  . aspirin EC 81 MG tablet Take 81 mg by mouth daily.    Marland Kitchen ECHINACEA PO Take 760 mg by mouth daily.    Marland Kitchen enoxaparin (LOVENOX) 120 MG/0.8ML injection Inject 0.72 mLs (110 mg total) into the skin every 12 (twelve) hours. 43.2 mL 0  . fluticasone (FLONASE) 50 MCG/ACT nasal spray Place 2 sprays into both nostrils daily as needed for allergies or rhinitis.    . Ginseng 100 MG CAPS Take 100 mg by mouth daily.    . hydrochlorothiazide (MICROZIDE) 12.5 MG capsule Take 12.5 mg by mouth daily.    . Ibrutinib 420 MG TABS Take 420 mg by mouth daily. 28 tablet 3  . JANUVIA 100 MG tablet Take 100 mg by mouth daily.  3  . lisinopril (PRINIVIL,ZESTRIL) 40 MG tablet Take 40 mg by mouth daily.    . metFORMIN (GLUCOPHAGE) 500 MG tablet Take 1,000 mg by mouth 2 (two) times daily.    .  Omega-3 Fatty Acids (OMEGA 3 500) 500 MG CAPS Take 500 mg by mouth daily.    . ONE TOUCH ULTRA TEST test strip USE 1 STRIP TO CHECK GLUCOSE ONCE A WEEK  99  . pravastatin (PRAVACHOL) 40 MG tablet Take 40 mg by mouth daily.    . Saw Palmetto 450 MG CAPS Take 450 mg by mouth daily.    . fluticasone (FLONASE) 50 MCG/ACT nasal spray Place 2 sprays into the nose daily. 16 g  11   No current facility-administered medications for this visit.       Marland Kitchen  PHYSICAL EXAMINATION: ECOG PERFORMANCE STATUS: 1 - Symptomatic but completely ambulatory  Vitals:   01/18/18 1121  BP: 137/67  Pulse: 80  Temp: 98.3 F (36.8 C)   Filed Weights   01/18/18 1121  Weight: 214 lb (97.1 kg)    Physical Exam  Constitutional: He is oriented to person, place, and time and well-developed, well-nourished, and in no distress.  Accompanied by his wife.  Bilateral hard of hearing.  HENT:  Head: Normocephalic and atraumatic.  Mouth/Throat: Oropharynx is clear and moist. No oropharyngeal exudate.  Eyes: Pupils are equal, round, and reactive to light.  Neck: Normal range of motion. Neck supple.  Cardiovascular: Normal rate and regular rhythm.  Pulmonary/Chest: No respiratory distress. He has no wheezes.  Abdominal: Soft. Bowel sounds are normal. He exhibits no distension and no mass. There is no tenderness. There is no rebound and no guarding.  Musculoskeletal: Normal range of motion. He exhibits no edema or tenderness.  Lymphadenopathy:  Positive for adenopathy in the neck underarms groins (improved; currently 2 to 3 cm in size)  Neurological: He is alert and oriented to person, place, and time.  Skin: Skin is warm.  Psychiatric: Affect normal.     LABORATORY DATA:  I have reviewed the data as listed Lab Results  Component Value Date   WBC 14.8 (H) 01/18/2018   HGB 12.1 (L) 01/18/2018   HCT 37.6 (L) 01/18/2018   MCV 84.3 01/18/2018   PLT 353 01/18/2018   Recent Labs    12/26/17 0628 12/26/17 1000  01/04/18 1409 01/18/18 1037  NA 143  --  137 136  K 4.1  --  4.9 5.0  CL 107  --  102 103  CO2 27  --  28 25  GLUCOSE 143*  --  135* 203*  BUN 40*  --  38* 54*  CREATININE 1.57*  --  1.43* 1.75*  CALCIUM 8.8*  --  9.8 9.5  GFRNONAA 45*  --  50* 39*  GFRAA 52*  --  58* 45*  PROT  --  6.7  --   --   ALBUMIN  --  4.2  --   --   AST  --  17  --   --   ALT  --  14  --   --   ALKPHOS  --  63  --   --   BILITOT  --  0.6  --   --   BILIDIR  --  0.1  --   --   IBILI  --  0.5  --   --     RADIOGRAPHIC STUDIES: I have personally reviewed the radiological images as listed and agreed with the findings in the report. Ct Soft Tissue Neck W Contrast  Result Date: 12/25/2017 CLINICAL DATA:  Bilateral submandibular swelling. EXAM: CT NECK WITH CONTRAST TECHNIQUE: Multidetector CT imaging of the neck was performed using the standard protocol following the bolus administration of intravenous contrast. CONTRAST:  73m ISOVUE-300 IOPAMIDOL (ISOVUE-300) INJECTION 61% COMPARISON:  None. FINDINGS: Pharynx and larynx: No thickening of Waldeyer's ring. Salivary glands: Masses in the bilateral parotids are attributed to lymph nodes. The submandibular glands are distorted by submandibular adenopathy. Thyroid: Negative Lymph nodes: 5 bulky and diffuse homogeneously enhancing lymph nodes throughout the bilateral neck, supraclavicular fossa, and axillae. 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 Visualized orbits: Negative Mastoids  and visualized paranasal sinuses: Clear Skeleton: Bulky spondylosis and degenerative facet spurring. Upper chest: Negative IMPRESSION: Bulky and generalized cervical and upper thoracic adenopathy that is most consistent with lymphoma. Electronically Signed   By: Monte Fantasia M.D.   On: 12/25/2017 14:23   Ct Chest W Contrast  Result Date: 12/25/2017 CLINICAL DATA:  Bilateral neck and axillary adenopathy EXAM: CT CHEST, ABDOMEN, AND  PELVIS WITH CONTRAST TECHNIQUE: Multidetector CT imaging of the chest, abdomen and pelvis was performed following the standard protocol during bolus administration of intravenous contrast. CONTRAST:  77m ISOVUE-300 IOPAMIDOL (ISOVUE-300) INJECTION 61% COMPARISON:  None. FINDINGS: CT CHEST FINDINGS Cardiovascular: The heart is normal in size. No pericardial effusion. The aorta is normal in caliber. Scattered atherosclerotic calcifications. No dissection. The branch vessels are patent. Dense coronary artery calcifications. Bilateral fairly significant pulmonary emboli are noted. Mediastinum/Nodes: Bulky supraclavicular and axillary lymphadenopathy. Index nodal mass in the left supraclavicular region on image number 1 measures 3.6 cm. Right-sided supraclavicular node on image number 1 measures 2.6 cm. Index right axillary lymph node on image number 13 measures 4.4 cm. Index node in the left axilla on image number 13 measures 4.7 cm. Right paratracheal lymph node on image number 20 measures 16 mm and left paratracheal/AP window node on image number 22 measures 19 mm. The esophagus is grossly normal. Lungs/Pleura: No acute pulmonary findings. No worrisome pulmonary lesions. No pleural effusions or pleural nodules. Musculoskeletal: No significant bony findings. Moderate degenerative changes involving the spine. CT ABDOMEN PELVIS FINDINGS Hepatobiliary: No focal hepatic lesions or intrahepatic biliary dilatation. The gallbladder is normal. Pancreas: No mass, inflammation or ductal dilatation. Spleen: Moderate splenomegaly. The spleen measures 15.5 x 15.0 x 11.5 cm. No focal lesions. Adrenals/Urinary Tract: The adrenal glands and kidneys are unremarkable. Stomach/Bowel: The stomach, duodenum, small bowel and colon are grossly normal. No acute inflammatory changes, mass lesions or obstructive findings. The terminal ileum and appendix are normal. Vascular/Lymphatic: Extensive lymphadenopathy in the abdomen and pelvis.  Cluster of retroperitoneal lymph nodes on image number 78 measures 10.6 x 5.4 cm and surrounds the aorta. 3.3 cm of celiac axis lymph node on image number 61. Numerous mesenteric lymph nodes. Bulky left periaortic nodal mass continues down into the pelvis. Index node on image number 95 measures 5 cm. Extensive pelvic adenopathy. Bulky operator region lymph nodes have significant mass effect in the midline on the sigmoid colon and especially the bladder which is markedly compressed. The right nodal mass measures 6.7 cm and the left nodal mass measures 6.7 cm. Extensive inguinal lymphadenopathy. Index node on the left measures 4.9 cm on image number 122 and index node on the right measures 3.9 cm on image number 126. Extensive atherosclerotic calcifications involving the aorta and iliac arteries. Reproductive: The prostate gland and seminal vesicles are grossly normal. Other: No free pelvic fluid collections. Musculoskeletal: No significant bony findings. IMPRESSION: 1. Fairly significant bilateral pulmonary emboli. 2. Bulky supraclavicular, axillary, mediastinal, mesenteric, retroperitoneal, pelvic and inguinal lymphadenopathy most consistent with lymphoma. 3. Splenomegaly without focal splenic lesions. 4. Bulky pelvic adenopathy is compressing the bladder and sigmoid colon. These results were called by telephone at the time of interpretation on 12/25/2017 at 5:00 pm to Dr. CCarloyn Manner, who verbally acknowledged these results. Electronically Signed   By: PMarijo SanesM.D.   On: 12/25/2017 17:01   Ct Abdomen Pelvis W Contrast  Result Date: 12/25/2017 CLINICAL DATA:  Bilateral neck and axillary adenopathy EXAM: CT CHEST, ABDOMEN, AND PELVIS WITH CONTRAST TECHNIQUE: Multidetector CT  imaging of the chest, abdomen and pelvis was performed following the standard protocol during bolus administration of intravenous contrast. CONTRAST:  35m ISOVUE-300 IOPAMIDOL (ISOVUE-300) INJECTION 61% COMPARISON:  None.  FINDINGS: CT CHEST FINDINGS Cardiovascular: The heart is normal in size. No pericardial effusion. The aorta is normal in caliber. Scattered atherosclerotic calcifications. No dissection. The branch vessels are patent. Dense coronary artery calcifications. Bilateral fairly significant pulmonary emboli are noted. Mediastinum/Nodes: Bulky supraclavicular and axillary lymphadenopathy. Index nodal mass in the left supraclavicular region on image number 1 measures 3.6 cm. Right-sided supraclavicular node on image number 1 measures 2.6 cm. Index right axillary lymph node on image number 13 measures 4.4 cm. Index node in the left axilla on image number 13 measures 4.7 cm. Right paratracheal lymph node on image number 20 measures 16 mm and left paratracheal/AP window node on image number 22 measures 19 mm. The esophagus is grossly normal. Lungs/Pleura: No acute pulmonary findings. No worrisome pulmonary lesions. No pleural effusions or pleural nodules. Musculoskeletal: No significant bony findings. Moderate degenerative changes involving the spine. CT ABDOMEN PELVIS FINDINGS Hepatobiliary: No focal hepatic lesions or intrahepatic biliary dilatation. The gallbladder is normal. Pancreas: No mass, inflammation or ductal dilatation. Spleen: Moderate splenomegaly. The spleen measures 15.5 x 15.0 x 11.5 cm. No focal lesions. Adrenals/Urinary Tract: The adrenal glands and kidneys are unremarkable. Stomach/Bowel: The stomach, duodenum, small bowel and colon are grossly normal. No acute inflammatory changes, mass lesions or obstructive findings. The terminal ileum and appendix are normal. Vascular/Lymphatic: Extensive lymphadenopathy in the abdomen and pelvis. Cluster of retroperitoneal lymph nodes on image number 78 measures 10.6 x 5.4 cm and surrounds the aorta. 3.3 cm of celiac axis lymph node on image number 61. Numerous mesenteric lymph nodes. Bulky left periaortic nodal mass continues down into the pelvis. Index node on image  number 95 measures 5 cm. Extensive pelvic adenopathy. Bulky operator region lymph nodes have significant mass effect in the midline on the sigmoid colon and especially the bladder which is markedly compressed. The right nodal mass measures 6.7 cm and the left nodal mass measures 6.7 cm. Extensive inguinal lymphadenopathy. Index node on the left measures 4.9 cm on image number 122 and index node on the right measures 3.9 cm on image number 126. Extensive atherosclerotic calcifications involving the aorta and iliac arteries. Reproductive: The prostate gland and seminal vesicles are grossly normal. Other: No free pelvic fluid collections. Musculoskeletal: No significant bony findings. IMPRESSION: 1. Fairly significant bilateral pulmonary emboli. 2. Bulky supraclavicular, axillary, mediastinal, mesenteric, retroperitoneal, pelvic and inguinal lymphadenopathy most consistent with lymphoma. 3. Splenomegaly without focal splenic lesions. 4. Bulky pelvic adenopathy is compressing the bladder and sigmoid colon. These results were called by telephone at the time of interpretation on 12/25/2017 at 5:00 pm to Dr. CCarloyn Manner, who verbally acknowledged these results. Electronically Signed   By: PMarijo SanesM.D.   On: 12/25/2017 17:01   Nm Pet Image Initial (pi) Skull Base To Thigh  Result Date: 01/01/2018 CLINICAL DATA:  Initial treatment strategy for newly diagnosed B-cell lymphoma. EXAM: NUCLEAR MEDICINE PET SKULL BASE TO THIGH TECHNIQUE: 12.8 mCi F-18 FDG was injected intravenously. Full-ring PET imaging was performed from the skull base to thigh after the radiotracer. CT data was obtained and used for attenuation correction and anatomic localization. Fasting blood glucose: 133 mg/dl COMPARISON:  CT chest abdomen pelvis dated 12/25/2017 FINDINGS: Mediastinal blood pool activity: SUV max 1.98 NECK: Bulky bilateral cervical lymphadenopathy. Index 5.8 cm short axis left level 2 node (  series 3/image 48), max SUV 4.8.  Incidental CT findings: none CHEST: Bulky bilateral supraclavicular and axillary lymphadenopathy. Index 4.8 cm short axis right axillary node (series 3/image 92), max SUV 4.5. Small mediastinal lymph nodes, including a 2.2 cm short axis AP window node (series 3/image 95), max SUV 3.9. No suspicious pulmonary nodules. Incidental CT findings: Mild atherosclerotic calcifications of the aortic arch. Three vessel coronary atherosclerosis. ABDOMEN/PELVIS: Mild upper abdominal lymphadenopathy, including a 2.3 cm short axis peripancreatic node (series 3/image 163), max SUV 5.0. Bulky para-aortic lymphadenopathy, including a 3.2 cm left para-aortic node (series 3/image 190), max SUV 4.7. Additional bulky retroperitoneal lymphadenopathy, including a 5.2 cm short axis lower left para-aortic node (series 3/image 216), max SUV 14.5. Bulky bilateral pelvic lymphadenopathy, including a 6.6 cm short axis right external iliac node (series 3/image 263) with max SUV 5.0 and a 4.8 cm short axis left inguinal node (series 3/image 263) with max SUV 6.7. Mild splenomegaly. No abnormal hypermetabolism of the liver, pancreas, or adrenal glands. Incidental CT findings: Atherosclerotic calcifications the abdominal aorta and branch vessels. Thick-walled bladder, although underdistended. Small fat containing right inguinal hernia. SKELETON: No focal hypermetabolic activity to suggest skeletal metastasis. Incidental CT findings: Degenerative changes of the visualized thoracolumbar spine. IMPRESSION: Bulky cervical, thoracic, and abdominopelvic lymphadenopathy, corresponding to known lymphoma, with index lesions as above. Mild splenomegaly. Electronically Signed   By: Julian Hy M.D.   On: 01/01/2018 15:48   [place] Core Biopsy (lymph Nodes)  Result Date: 12/26/2017 CLINICAL DATA:  Extensive multilevel lymphadenopathy. No known primary. EXAM: ULTRASOUND GUIDED CORE BIOPSY OF LEFT INGUINAL ADENOPATHY MEDICATIONS: Intravenous Fentanyl and  Versed were administered as conscious sedation during continuous monitoring of the patient's level of consciousness and physiological / cardiorespiratory status by the radiology RN, with a total moderate sedation time of 11 minutes. PROCEDURE: The procedure, risks, benefits, and alternatives were explained to the patient. Questions regarding the procedure were encouraged and answered. The patient understands and consents to the procedure. Survey ultrasound of the left inguinal region performed. Adenopathy with nodes measured up to 5.7 cm were identified. An appropriate skin entry site was determined and marked. The operative field was prepped with chlorhexidine in a sterile fashion, and a sterile drape was applied covering the operative field. A sterile gown and sterile gloves were used for the procedure. Local anesthesia was provided with 1% Lidocaine. Under real-time ultrasound guidance, a 17 gauge trocar needle was advanced to the margin of the lesion. Once needle tip position was confirmed, coaxial 18-gauge core biopsy samples were obtained, submitted in saline to surgical pathology. The guide needle was removed. Postprocedure images show no hematoma or other apparent complication. The patient tolerated the procedure well. COMPLICATIONS: None. FINDINGS: Marked left inguinal adenopathy was confirmed. Representative core biopsy samples obtained as above IMPRESSION: 1. Technically successful ultrasound-guided core biopsy, left inguinal adenopathy Electronically Signed   By: Lucrezia Europe M.D.   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  #Significant clinical response noted-continue ibrutinib.;  See discussion below  #Acute renal failure creatinine 1.7; baseline 1.3; on allopurinol.  Question tumor lysis.  Check LDH; mag phosphorus uric  acid.  Recommend holding holding hydrochlorothiazide/lisinopril.  #Bilateral PE continue Lovenox.  # Hypertension-controlled; but with acute renal fialure [creat 1.75]; see above  # Easy bruising/nosebleeds-second to Lovenox/ibrutinib.  Monitor closely.  DISPOSITION:  # Add LDH today; mag;phos; uric acid.  # Follow up in 1 weeks/labs- cbc/bmp/ldh/uric acid/phos/mag- Dr.B  Cc; Dr.Tate    All questions were answered. The patient knows to call the clinic with any problems, questions or concerns.       Cammie Sickle, MD 01/18/2018 12:58 PM

## 2018-01-18 NOTE — Assessment & Plan Note (Addendum)
#   Small B-cell lymphoma above and below the diaphragm; on Ibrutinib 420 mg/day; for the last 10 days.  #Significant clinical response noted-continue ibrutinib.;  See discussion below  #Acute renal failure creatinine 1.7; baseline 1.3; on allopurinol.  Question tumor lysis.  Check LDH; mag phosphorus uric acid.  Recommend holding holding hydrochlorothiazide/lisinopril.  #Bilateral PE continue Lovenox.  # Hypertension-controlled; but with acute renal fialure [creat 1.75]; see above  # Easy bruising/nosebleeds-second to Lovenox/ibrutinib.  Monitor closely.  Also stop aspirin.  DISPOSITION:  # Add LDH today; mag;phos; uric acid.  # Follow up in 1 weeks/labs- cbc/bmp/ldh/uric acid/phos/mag- Dr.B  Cc; Dr.Tate

## 2018-01-18 NOTE — Progress Notes (Signed)
Patient stated that he had lost 14 lbs since 12/25/2017. Patient stated that he has to eat in intervals of two hours because if not, he gets nauseated and might vomit.

## 2018-01-18 NOTE — Patient Instructions (Addendum)
#   HOLD Asprin/Lisinopril/hydrocholrthiazide; check BP at home daily- and call us if BP > 150/90

## 2018-01-25 ENCOUNTER — Ambulatory Visit: Payer: Federal, State, Local not specified - PPO | Admitting: Internal Medicine

## 2018-01-25 ENCOUNTER — Other Ambulatory Visit: Payer: Federal, State, Local not specified - PPO

## 2018-01-29 ENCOUNTER — Other Ambulatory Visit: Payer: Self-pay

## 2018-01-29 ENCOUNTER — Inpatient Hospital Stay (HOSPITAL_BASED_OUTPATIENT_CLINIC_OR_DEPARTMENT_OTHER): Payer: Medicare Other | Admitting: Internal Medicine

## 2018-01-29 ENCOUNTER — Telehealth: Payer: Self-pay | Admitting: Pharmacy Technician

## 2018-01-29 ENCOUNTER — Inpatient Hospital Stay: Payer: Medicare Other

## 2018-01-29 VITALS — BP 166/77 | HR 77 | Temp 98.2°F | Resp 18 | Ht 73.0 in | Wt 216.1 lb

## 2018-01-29 DIAGNOSIS — I129 Hypertensive chronic kidney disease with stage 1 through stage 4 chronic kidney disease, or unspecified chronic kidney disease: Secondary | ICD-10-CM

## 2018-01-29 DIAGNOSIS — Z7901 Long term (current) use of anticoagulants: Secondary | ICD-10-CM

## 2018-01-29 DIAGNOSIS — N183 Chronic kidney disease, stage 3 (moderate): Secondary | ICD-10-CM

## 2018-01-29 DIAGNOSIS — C8308 Small cell B-cell lymphoma, lymph nodes of multiple sites: Secondary | ICD-10-CM | POA: Diagnosis not present

## 2018-01-29 DIAGNOSIS — E1122 Type 2 diabetes mellitus with diabetic chronic kidney disease: Secondary | ICD-10-CM | POA: Diagnosis not present

## 2018-01-29 DIAGNOSIS — I2699 Other pulmonary embolism without acute cor pulmonale: Secondary | ICD-10-CM

## 2018-01-29 LAB — BASIC METABOLIC PANEL
Anion gap: 6 (ref 5–15)
BUN: 29 mg/dL — ABNORMAL HIGH (ref 8–23)
CO2: 27 mmol/L (ref 22–32)
Calcium: 9 mg/dL (ref 8.9–10.3)
Chloride: 103 mmol/L (ref 98–111)
Creatinine, Ser: 1.23 mg/dL (ref 0.61–1.24)
GFR calc Af Amer: >60 mL/min (ref >60)
GFR calc non Af Amer: >60 mL/min (ref >60)
Glucose, Bld: 168 mg/dL — ABNORMAL HIGH (ref 70–99)
Potassium: 4.5 mmol/L (ref 3.5–5.1)
Sodium: 136 mmol/L (ref 135–145)

## 2018-01-29 LAB — CBC WITH DIFFERENTIAL/PLATELET
Abs Immature Granulocytes: 0.04 10*3/uL (ref 0.00–0.07)
Basophils Absolute: 0.2 10*3/uL — ABNORMAL HIGH (ref 0.0–0.1)
Basophils Relative: 1 %
Eosinophils Absolute: 0.2 10*3/uL (ref 0.0–0.5)
Eosinophils Relative: 1 %
HCT: 39.3 % (ref 39.0–52.0)
Hemoglobin: 12.1 g/dL — ABNORMAL LOW (ref 13.0–17.0)
Immature Granulocytes: 0 %
Lymphocytes Relative: 54 %
Lymphs Abs: 7 10*3/uL — ABNORMAL HIGH (ref 0.7–4.0)
MCH: 26.4 pg (ref 26.0–34.0)
MCHC: 30.8 g/dL (ref 30.0–36.0)
MCV: 85.8 fL (ref 80.0–100.0)
Monocytes Absolute: 0.3 10*3/uL (ref 0.1–1.0)
Monocytes Relative: 2 %
Neutro Abs: 5.5 10*3/uL (ref 1.7–7.7)
Neutrophils Relative %: 42 %
Platelets: 283 10*3/uL (ref 150–400)
RBC: 4.58 MIL/uL (ref 4.22–5.81)
RDW: 14.1 % (ref 11.5–15.5)
WBC: 13.2 10*3/uL — ABNORMAL HIGH (ref 4.0–10.5)
nRBC: 0 % (ref 0.0–0.2)

## 2018-01-29 LAB — PHOSPHORUS: Phosphorus: 4.1 mg/dL (ref 2.5–4.6)

## 2018-01-29 LAB — URIC ACID: Uric Acid, Serum: 3.4 mg/dL — ABNORMAL LOW (ref 3.7–8.6)

## 2018-01-29 LAB — LACTATE DEHYDROGENASE: LDH: 104 U/L (ref 98–192)

## 2018-01-29 MED ORDER — APIXABAN 5 MG PO TABS: 5.0000 mg | ORAL_TABLET | Freq: Two times a day (BID) | ORAL | 5 refills | Status: DC

## 2018-01-29 MED FILL — IMBRUVICA 420 MG TAB: 420 | 28 days supply | Qty: 28 | Fill #1

## 2018-01-29 NOTE — Progress Notes (Signed)
[facility] NOTE  Patient Care Team: [name], MD as PCP - General (Internal Medicine)  CHIEF COMPLAINTS/PURPOSE OF CONSULTATION:  SMALL CELL LYMPHOMA  #  Oncology History   # October 2019- SMALL CELL LYMPHOMA; ki-67-10%. STAGE- III/ IV; PET scan- bulky LN above/below Diaphragm; No spleen/liver/bone;   # 22nd October 2019- Bil PE on lovenox   # DM/HTN/ CKD- stage III -----------------------------------------------   MOLECULAR TESTING: Trisomy 12. 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 [name] 67 y.o.  male above new diagnosis of SLL/CLL on ibrutinib; incidental bilateral PE-Lovenox is here for follow-up.  Patient complains of pain around the abdomen from multiple injections.  Otherwise he continues to note improvement of his adenopathy.  Also improvement of his lymph nodes in underarm.  His breathing is improved.  One episode of nausea vomiting after he had a big meal. Nosebleeds improved.   Review of Systems  Constitutional: Negative for chills, diaphoresis, fever, malaise/fatigue and weight loss.  HENT: Positive for nosebleeds. Negative for sore throat.   Eyes: Negative for double vision.  Respiratory: Negative for cough, hemoptysis, sputum production, shortness of breath and wheezing.   Cardiovascular: Negative for chest pain, palpitations, orthopnea and leg swelling.  Gastrointestinal: Positive for nausea and vomiting. Negative for abdominal pain, blood in stool, constipation, diarrhea, heartburn and melena.  Genitourinary: Negative for  dysuria, frequency and urgency.  Musculoskeletal: Negative for back pain and joint pain.  Skin: Negative.  Negative for itching and rash.  Neurological: Negative for dizziness, tingling, focal weakness, weakness and headaches.  Endo/Heme/Allergies: Does not bruise/bleed easily.  Psychiatric/Behavioral: Negative for depression. The patient is not nervous/anxious and does not have insomnia.      MEDICAL HISTORY:  Past Medical History:  Diagnosis Date  . Anxiety   . Arthritis   . 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 HOH (hard of hearing)   . Hypertension   . Osteomyelitis of toe ([facility]) 06/08/11   right foot  . Seasonal allergies   . Type II diabetes mellitus ([place])    diet controlled    SURGICAL HISTORY: Past Surgical History:  Procedure Laterality Date  . AMPUTATION  06/08/2011   Procedure: AMPUTATION RAY;  Surgeon: Wylene Simmer, MD;  Location: [facility];  Service: Orthopedics;  Laterality: Right;  Righ Hallux Amputation  . [place]   "crushed"  . FRACTURE SURGERY    . 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 PILONIDAL CYST / SINUS EXCISION  1970's  . TOE AMPUTATION  06/08/11   partial; right great toe    SOCIAL HISTORY: Social History   Socioeconomic History  . Marital status: Married    Spouse name: Not on file  . Number of children: Not on file  . Years of education: Not on file  . Highest education level: Not on file  Occupational History  . Not on file  Social Needs  . Financial resource strain: Not on file  .  Food insecurity:    Worry: Not on file    Inability: Not on file  . Transportation needs:    Medical: Not on file    Non-medical: Not on file  Tobacco Use  . Smoking status: Former Smoker    Packs/day: 1.50    Years: 22.00    Pack years: 33.00    Types: Cigarettes    Last attempt to quit: 05/24/2011    Years since quitting: 6.6  . Smokeless tobacco: Former Systems developer    Types: [place] date: 03/07/1983  Substance and Sexual  Activity  . Alcohol use: Yes    Comment: 06/08/11 "no liquor for 2 1/2 years; last beer 05/31/11"  . Drug use: No  . Sexual activity: Yes  Lifestyle  . Physical activity:    Days per week: Not on file    Minutes per session: Not on file  . Stress: Not on file  Relationships  . Social connections:    Talks on phone: Not on file    Gets together: Not on file    Attends religious service: Not on file    Active member of club or organization: Not on file    Attends meetings of clubs or organizations: Not on file    Relationship status: Not on file  . Intimate partner violence:    Fear of current or ex partner: Not on file    Emotionally abused: Not on file    Physically abused: Not on file    Forced sexual activity: Not on file  Other Topics Concern  . Not on file  Social History Narrative   Married    FAMILY HISTORY: Family History  Problem Relation Age of Onset  . Prostate cancer Father   . Hypertension Father   . Other Mother        VARICOSE VEINS    ALLERGIES:  is allergic to penicillins.  MEDICATIONS:  Current Outpatient Medications  Medication Sig Dispense Refill  . allopurinol (ZYLOPRIM) 300 MG tablet Take 1 tablet (300 mg total) by mouth 2 (two) times daily. 120 tablet 3  . aspirin EC 81 MG tablet Take 81 mg by mouth daily.    Marland Kitchen ECHINACEA PO Take 760 mg by mouth daily.    Marland Kitchen enoxaparin (LOVENOX) 120 MG/0.8ML injection Inject 0.72 mLs (110 mg total) into the skin every 12 (twelve) hours. 43.2 mL 0  . fluticasone (FLONASE) 50 MCG/ACT nasal spray Place 2 sprays into both nostrils daily as needed for allergies or rhinitis.    . Ginseng 100 MG CAPS Take 100 mg by mouth daily.    . hydrochlorothiazide (MICROZIDE) 12.5 MG capsule Take 12.5 mg by mouth daily.    Marland Kitchen JANUVIA 100 MG tablet Take 100 mg by mouth daily.  3  . lisinopril (PRINIVIL,ZESTRIL) 40 MG tablet Take 40 mg by mouth daily.    . metFORMIN (GLUCOPHAGE) 500 MG tablet Take 1,000 mg by mouth 2 (two) times daily.     . Omega-3 Fatty Acids (OMEGA 3 500) 500 MG CAPS Take 500 mg by mouth daily.    . ONE TOUCH ULTRA TEST test strip USE 1 STRIP TO CHECK GLUCOSE ONCE A WEEK  99  . pravastatin (PRAVACHOL) 40 MG tablet Take 40 mg by mouth daily.    . Saw Palmetto 450 MG CAPS Take 450 mg by mouth daily.    Marland Kitchen apixaban (ELIQUIS) 5 MG TABS tablet Take 1 tablet (5 mg total) by mouth 2 (two) times daily.  60 tablet 5  . fluticasone (FLONASE) 50 MCG/ACT nasal spray Place 2 sprays into the nose daily. 16 g 11  . Ibrutinib 420 MG TABS Take 420 mg by mouth daily. 28 tablet 3   No current facility-administered medications for this visit.       Marland Kitchen  PHYSICAL EXAMINATION: ECOG PERFORMANCE STATUS: 1 - Symptomatic but completely ambulatory  Vitals:   01/29/18 1340  BP: (!) 166/77  Pulse: 77  Resp: 18  Temp: 98.2 F (36.8 C)   Filed Weights   01/29/18 1340  Weight: 216 lb 0.8 oz (98 kg)    Physical Exam  Constitutional: He is oriented to person, place, and time and well-developed, well-nourished, and in no distress.  Accompanied by his wife.  Bilateral hard of hearing.  HENT:  Head: Normocephalic and atraumatic.  Mouth/Throat: Oropharynx is clear and moist. No oropharyngeal exudate.  Eyes: Pupils are equal, round, and reactive to light.  Neck: Normal range of motion. Neck supple.  Cardiovascular: Normal rate and regular rhythm.  Pulmonary/Chest: No respiratory distress. He has no wheezes.  Abdominal: Soft. Bowel sounds are normal. He exhibits no distension and no mass. There is no tenderness. There is no rebound and no guarding.  Musculoskeletal: Normal range of motion. He exhibits no edema or tenderness.  Lymphadenopathy:  Positive for adenopathy in the neck underarms groins (improved; currently 2 to 3 cm in size)  Neurological: He is alert and oriented to person, place, and time.  Skin: Skin is warm.  Psychiatric: Affect normal.     LABORATORY DATA:  I have reviewed the data as listed Lab Results   Component Value Date   WBC 13.2 (H) 01/29/2018   HGB 12.1 (L) 01/29/2018   HCT 39.3 01/29/2018   MCV 85.8 01/29/2018   PLT 283 01/29/2018   Recent Labs    12/26/17 1000 01/04/18 1409 01/18/18 1037 01/29/18 1320  NA  --  137 136 136  K  --  4.9 5.0 4.5  CL  --  102 103 103  CO2  --  '28 25 27  ' GLUCOSE  --  135* 203* 168*  BUN  --  38* 54* 29*  CREATININE  --  1.43* 1.75* 1.23  CALCIUM  --  9.8 9.5 9.0  GFRNONAA  --  50* 39* >60  GFRAA  --  58* 45* >60  PROT 6.7  --   --   --   ALBUMIN 4.2  --   --   --   AST 17  --   --   --   ALT 14  --   --   --   ALKPHOS 63  --   --   --   BILITOT 0.6  --   --   --   BILIDIR 0.1  --   --   --   IBILI 0.5  --   --   --     RADIOGRAPHIC STUDIES: I have personally reviewed the radiological images as listed and agreed with the findings in the report. Nm Pet Image Initial (pi) Skull Base To Thigh  Result Date: 01/01/2018 CLINICAL DATA:  Initial treatment strategy for newly diagnosed B-cell lymphoma. EXAM: NUCLEAR MEDICINE PET SKULL BASE TO THIGH TECHNIQUE: 12.8 mCi F-18 FDG was injected intravenously. Full-ring PET imaging was performed from the skull base to thigh after the radiotracer. CT data was obtained and used for attenuation correction and anatomic localization. Fasting blood glucose: 133 mg/dl COMPARISON:  CT chest abdomen pelvis  dated 12/25/2017 FINDINGS: Mediastinal blood pool activity: SUV max 1.98 NECK: Bulky bilateral cervical lymphadenopathy. Index 5.8 cm short axis left level 2 node (series 3/image 48), max SUV 4.8. Incidental CT findings: none CHEST: Bulky bilateral supraclavicular and axillary lymphadenopathy. Index 4.8 cm short axis right axillary node (series 3/image 92), max SUV 4.5. Small mediastinal lymph nodes, including a 2.2 cm short axis AP window node (series 3/image 95), max SUV 3.9. No suspicious pulmonary nodules. Incidental CT findings: Mild atherosclerotic calcifications of the aortic arch. Three vessel coronary  atherosclerosis. ABDOMEN/PELVIS: Mild upper abdominal lymphadenopathy, including a 2.3 cm short axis peripancreatic node (series 3/image 163), max SUV 5.0. Bulky para-aortic lymphadenopathy, including a 3.2 cm left para-aortic node (series 3/image 190), max SUV 4.7. Additional bulky retroperitoneal lymphadenopathy, including a 5.2 cm short axis lower left para-aortic node (series 3/image 216), max SUV 14.5. Bulky bilateral pelvic lymphadenopathy, including a 6.6 cm short axis right external iliac node (series 3/image 263) with max SUV 5.0 and a 4.8 cm short axis left inguinal node (series 3/image 263) with max SUV 6.7. Mild splenomegaly. No abnormal hypermetabolism of the liver, pancreas, or adrenal glands. Incidental CT findings: Atherosclerotic calcifications the abdominal aorta and branch vessels. Thick-walled bladder, although underdistended. Small fat containing right inguinal hernia. SKELETON: No focal hypermetabolic activity to suggest skeletal metastasis. Incidental CT findings: Degenerative changes of the visualized thoracolumbar spine. IMPRESSION: Bulky cervical, thoracic, and abdominopelvic lymphadenopathy, corresponding to known lymphoma, with index lesions as above. Mild splenomegaly. Electronically Signed   By: Julian Hy M.D.   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  I GVH mutated.  On Ibrutinib 420 mg/day; # Significant clinical response noted-continue ibrutinib.  Improved.  # Bilateral PE continue Lovenox 120 BID; patient tired of getting shots.  Recommend Eliquis 5 mg twice daily prescription sent to his pharmacy.  He will call us if having issues with filling Eliquis.  Will check with oral pharmacy.  # CKD- stable;  baseline 1.3; on allopurinol.   # HTN- better controlled; HOLD off Lisnipril/HCTZ. Continue checking BP medications.   # Constipation: recommend  Miralax as needed.   DISPOSITION:  # Follow up in 3 weeks/labs-cbc/bmp-Dr.B  Cc; Dr.Tate    All questions were answered. The patient knows to call the clinic with any problems, questions or concerns.       Cammie Sickle, MD 01/29/2018 3:26 PM

## 2018-01-29 NOTE — Assessment & Plan Note (Addendum)
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  On Ibrutinib 420 mg/day; # Significant clinical response noted-continue ibrutinib.  Improved.  # Bilateral PE continue Lovenox 120 BID; patient tired of getting shots.  Recommend Eliquis 5 mg twice daily prescription sent to his pharmacy.  He will call us if having issues with filling Eliquis.  Will check with oral pharmacy.  # CKD- stable;  baseline 1.3; on allopurinol.   # HTN- better controlled; HOLD off Lisnipril/HCTZ. Continue checking BP medications.   # Constipation: recommend Miralax as needed.   DISPOSITION:  # Follow up in 3 weeks/labs-cbc/bmp-Dr.B  Cc; Dr.Tate

## 2018-01-29 NOTE — Telephone Encounter (Signed)
Oral Oncology Patient Advocate Encounter   Was successful in obtaining a copay card for Eliquis.  This copay card will make the patients copay $30.  The billing information is as follows and has been shared with [facility].   RxBin: [id number] PCN: [id number] Member ID: [id number] Group ID: [id number]   [name] Patient [place] Phone [phone] Fax [phone] 01/29/2018 3:59 PM

## 2018-02-19 ENCOUNTER — Inpatient Hospital Stay: Payer: Medicare Other | Attending: Internal Medicine | Admitting: Internal Medicine

## 2018-02-19 ENCOUNTER — Inpatient Hospital Stay: Payer: Medicare Other

## 2018-02-19 ENCOUNTER — Encounter: Payer: Self-pay | Admitting: Internal Medicine

## 2018-02-19 VITALS — BP 161/66 | HR 73 | Temp 97.5°F | Resp 20 | Wt 217.0 lb

## 2018-02-19 DIAGNOSIS — Z7901 Long term (current) use of anticoagulants: Secondary | ICD-10-CM

## 2018-02-19 DIAGNOSIS — C8308 Small cell B-cell lymphoma, lymph nodes of multiple sites: Secondary | ICD-10-CM

## 2018-02-19 DIAGNOSIS — I2699 Other pulmonary embolism without acute cor pulmonale: Secondary | ICD-10-CM | POA: Insufficient documentation

## 2018-02-19 DIAGNOSIS — I129 Hypertensive chronic kidney disease with stage 1 through stage 4 chronic kidney disease, or unspecified chronic kidney disease: Secondary | ICD-10-CM | POA: Insufficient documentation

## 2018-02-19 DIAGNOSIS — Z87891 Personal history of nicotine dependence: Secondary | ICD-10-CM | POA: Insufficient documentation

## 2018-02-19 DIAGNOSIS — E1122 Type 2 diabetes mellitus with diabetic chronic kidney disease: Secondary | ICD-10-CM

## 2018-02-19 DIAGNOSIS — N183 Chronic kidney disease, stage 3 (moderate): Secondary | ICD-10-CM | POA: Diagnosis not present

## 2018-02-19 LAB — CBC WITH DIFFERENTIAL/PLATELET
Abs Immature Granulocytes: 0.06 10*3/uL (ref 0.00–0.07)
Basophils Absolute: 0.1 10*3/uL (ref 0.0–0.1)
Basophils Relative: 1 %
Eosinophils Absolute: 0.1 10*3/uL (ref 0.0–0.5)
Eosinophils Relative: 1 %
HCT: 40.4 % (ref 39.0–52.0)
Hemoglobin: 12.6 g/dL — ABNORMAL LOW (ref 13.0–17.0)
Immature Granulocytes: 1 %
Lymphocytes Relative: 51 %
Lymphs Abs: 6.5 10*3/uL — ABNORMAL HIGH (ref 0.7–4.0)
MCH: 26.8 pg (ref 26.0–34.0)
MCHC: 31.2 g/dL (ref 30.0–36.0)
MCV: 85.8 fL (ref 80.0–100.0)
Monocytes Absolute: 0.6 10*3/uL (ref 0.1–1.0)
Monocytes Relative: 5 %
Neutro Abs: 5.1 10*3/uL (ref 1.7–7.7)
Neutrophils Relative %: 41 %
Platelets: 305 10*3/uL (ref 150–400)
RBC: 4.71 MIL/uL (ref 4.22–5.81)
RDW: 14.7 % (ref 11.5–15.5)
WBC: 12.4 10*3/uL — ABNORMAL HIGH (ref 4.0–10.5)
nRBC: 0 % (ref 0.0–0.2)

## 2018-02-19 LAB — BASIC METABOLIC PANEL
Anion gap: 8 (ref 5–15)
BUN: 20 mg/dL (ref 8–23)
CO2: 27 mmol/L (ref 22–32)
Calcium: 8.6 mg/dL — ABNORMAL LOW (ref 8.9–10.3)
Chloride: 101 mmol/L (ref 98–111)
Creatinine, Ser: 1.18 mg/dL (ref 0.61–1.24)
GFR calc Af Amer: >60 mL/min (ref >60)
GFR calc non Af Amer: >60 mL/min (ref >60)
Glucose, Bld: 168 mg/dL — ABNORMAL HIGH (ref 70–99)
Potassium: 4.4 mmol/L (ref 3.5–5.1)
Sodium: 136 mmol/L (ref 135–145)

## 2018-02-19 NOTE — Assessment & Plan Note (Addendum)
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 On Ibrutinib 420 mg/day; improved; continue ibrutinib. Improved.  # Bilateral PE-stable.  On eliquis 5 mg BID.  Recommend holding of any biking given the risk of fall.  # CKD-stage III improved.  # HTN- ~140 at home; restart lisinopril; continue to hold off hydrochlorothiazide.  # Constipation: improved; on Miralax as needed.   DISPOSITION:  # Follow up in 4 weeks/labs-cbc/bmp-Dr.B  Cc; Dr.Tate

## 2018-02-19 NOTE — Progress Notes (Signed)
[facility] NOTE  Patient Care Team: [name], MD as PCP - General (Internal Medicine)  CHIEF COMPLAINTS/PURPOSE OF CONSULTATION:  SMALL CELL LYMPHOMA  #  Oncology History   # October 2019- SMALL CELL LYMPHOMA; ki-67-10%. STAGE- III/ IV; PET scan- bulky LN above/below Diaphragm; No spleen/liver/bone;   # 22nd October 2019- Bil PE on lovenox xstopped; NOV 2019- Eliquis 5 mg BID.   # DM/HTN/ CKD- stage III -----------------------------------------------   MOLECULAR TESTING: Trisomy 12. 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 [name] 67 y.o.  male above new diagnosis of SLL/CLL on ibrutinib; incidental bilateral PE is here for follow-up.  Since the last visit patient has been switched over to Eliquis; given his intolerance to Lovenox.  Patient appetite is good.  Denies any blood in stools black or stools.  Denies any nausea vomiting.  No further nosebleeds.   Review of Systems  Constitutional: Negative for chills, diaphoresis, fever, malaise/fatigue and weight loss.  HENT: Negative for sore throat.   Eyes: Negative for double vision.  Respiratory: Negative for cough, hemoptysis, sputum production, shortness of breath and wheezing.   Cardiovascular: Negative for chest pain, palpitations, orthopnea and leg swelling.  Gastrointestinal: Positive for nausea and vomiting. Negative for abdominal pain, blood in stool, constipation, diarrhea, heartburn and melena.  Genitourinary: Negative for dysuria, frequency and urgency.  Musculoskeletal: Negative for  back pain and joint pain.  Skin: Negative.  Negative for itching and rash.  Neurological: Negative for dizziness, tingling, focal weakness, weakness and headaches.  Endo/Heme/Allergies: Does not bruise/bleed easily.  Psychiatric/Behavioral: Negative for depression. The patient is not nervous/anxious and does not have insomnia.      MEDICAL HISTORY:  Past Medical History:  Diagnosis Date  . Anxiety   . Arthritis   . 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 HOH (hard of hearing)   . Hypertension   . Osteomyelitis of toe ([facility]) 06/08/11   right foot  . Seasonal allergies   . Type II diabetes mellitus ([place])    diet controlled    SURGICAL HISTORY: Past Surgical History:  Procedure Laterality Date  . AMPUTATION  06/08/2011   Procedure: AMPUTATION RAY;  Surgeon: Wylene Simmer, MD;  Location: [facility];  Service: Orthopedics;  Laterality: Right;  Righ Hallux Amputation  . [place]   "crushed"  . FRACTURE SURGERY    . 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 PILONIDAL CYST / SINUS EXCISION  1970's  . TOE AMPUTATION  06/08/11   partial; right great toe    SOCIAL HISTORY: Social History   Socioeconomic History  . Marital status: Married    Spouse name: Not on file  . Number of children: Not on file  . Years of education: Not on file  . Highest education level: Not on file  Occupational History  . Not on file  Social Needs  . Financial resource strain: Not on file  . Food insecurity:  Worry: Not on file    Inability: Not on file  . Transportation needs:    Medical: Not on file    Non-medical: Not on file  Tobacco Use  . Smoking status: Former Smoker    Packs/day: 1.50    Years: 22.00    Pack years: 33.00    Types: Cigarettes    Last attempt to quit: 05/24/2011    Years since quitting: 6.7  . Smokeless tobacco: Former Systems developer    Types: [place] date: 03/07/1983  Substance and Sexual Activity  . Alcohol use: Yes    Comment: 06/08/11 "no liquor for 2  1/2 years; last beer 05/31/11"  . Drug use: No  . Sexual activity: Yes  Lifestyle  . Physical activity:    Days per week: Not on file    Minutes per session: Not on file  . Stress: Not on file  Relationships  . Social connections:    Talks on phone: Not on file    Gets together: Not on file    Attends religious service: Not on file    Active member of club or organization: Not on file    Attends meetings of clubs or organizations: Not on file    Relationship status: Not on file  . Intimate partner violence:    Fear of current or ex partner: Not on file    Emotionally abused: Not on file    Physically abused: Not on file    Forced sexual activity: Not on file  Other Topics Concern  . Not on file  Social History Narrative   Married    FAMILY HISTORY: Family History  Problem Relation Age of Onset  . Prostate cancer Father   . Hypertension Father   . Other Mother        VARICOSE VEINS    ALLERGIES:  is allergic to penicillins.  MEDICATIONS:  Current Outpatient Medications  Medication Sig Dispense Refill  . allopurinol (ZYLOPRIM) 300 MG tablet Take 1 tablet (300 mg total) by mouth 2 (two) times daily. 120 tablet 3  . apixaban (ELIQUIS) 5 MG TABS tablet Take 1 tablet (5 mg total) by mouth 2 (two) times daily. 60 tablet 5  . aspirin EC 81 MG tablet Take 81 mg by mouth daily.    Marland Kitchen ECHINACEA PO Take 760 mg by mouth daily.    . fluticasone (FLONASE) 50 MCG/ACT nasal spray Place 2 sprays into both nostrils daily as needed for allergies or rhinitis.    . Ginseng 100 MG CAPS Take 100 mg by mouth daily.    . hydrochlorothiazide (MICROZIDE) 12.5 MG capsule Take 12.5 mg by mouth daily.    . Ibrutinib 420 MG TABS Take 420 mg by mouth daily. 28 tablet 3  . JANUVIA 100 MG tablet Take 100 mg by mouth daily.  3  . lisinopril (PRINIVIL,ZESTRIL) 40 MG tablet Take 40 mg by mouth daily.    . metFORMIN (GLUCOPHAGE) 500 MG tablet Take 1,000 mg by mouth 2 (two) times daily.    . Omega-3 Fatty  Acids (OMEGA 3 500) 500 MG CAPS Take 500 mg by mouth daily.    . ONE TOUCH ULTRA TEST test strip USE 1 STRIP TO CHECK GLUCOSE ONCE A WEEK  99  . pravastatin (PRAVACHOL) 40 MG tablet Take 40 mg by mouth daily.    . Saw Palmetto 450 MG CAPS Take 450 mg by mouth daily.    Marland Kitchen enoxaparin (LOVENOX) 120 MG/0.8ML injection Inject 0.72 mLs (  110 mg total) into the skin every 12 (twelve) hours. 43.2 mL 0  . fluticasone (FLONASE) 50 MCG/ACT nasal spray Place 2 sprays into the nose daily. 16 g 11   No current facility-administered medications for this visit.       Marland Kitchen  PHYSICAL EXAMINATION: ECOG PERFORMANCE STATUS: 1 - Symptomatic but completely ambulatory  Vitals:   02/19/18 1346 02/19/18 1415  BP:  (!) 161/66  Pulse: 81 73  Resp:  20  Temp: (!) 97.5 F (36.4 C)    Filed Weights   02/19/18 1346  Weight: 217 lb (98.4 kg)    Physical Exam  Constitutional: He is oriented to person, place, and time and well-developed, well-nourished, and in no distress.  Accompanied by his wife.  Bilateral hard of hearing.  HENT:  Head: Normocephalic and atraumatic.  Mouth/Throat: Oropharynx is clear and moist. No oropharyngeal exudate.  Eyes: Pupils are equal, round, and reactive to light.  Neck: Normal range of motion. Neck supple.  Cardiovascular: Normal rate and regular rhythm.  Pulmonary/Chest: No respiratory distress. He has no wheezes.  Abdominal: Soft. Bowel sounds are normal. He exhibits no distension and no mass. There is no abdominal tenderness. There is no rebound and no guarding.  Musculoskeletal: Normal range of motion.        General: No tenderness or edema.  Lymphadenopathy:  Significant improvement of the lymphadenopathy in the neck/resolved; scattered approximately 1 cm few lymph nodes present in the right underarm.  Neurological: He is alert and oriented to person, place, and time.  Skin: Skin is warm.  Psychiatric: Affect normal.     LABORATORY DATA:  I have reviewed the data as  listed Lab Results  Component Value Date   WBC 12.4 (H) 02/19/2018   HGB 12.6 (L) 02/19/2018   HCT 40.4 02/19/2018   MCV 85.8 02/19/2018   PLT 305 02/19/2018   Recent Labs    12/26/17 1000  01/18/18 1037 01/29/18 1320 02/19/18 1324  NA  --    < > 136 136 136  K  --    < > 5.0 4.5 4.4  CL  --    < > 103 103 101  CO2  --    < > '25 27 27  ' GLUCOSE  --    < > 203* 168* 168*  BUN  --    < > 54* 29* 20  CREATININE  --    < > 1.75* 1.23 1.18  CALCIUM  --    < > 9.5 9.0 8.6*  GFRNONAA  --    < > 39* >60 >60  GFRAA  --    < > 45* >60 >60  PROT 6.7  --   --   --   --   ALBUMIN 4.2  --   --   --   --   AST 17  --   --   --   --   ALT 14  --   --   --   --   ALKPHOS 63  --   --   --   --   BILITOT 0.6  --   --   --   --   BILIDIR 0.1  --   --   --   --   IBILI 0.5  --   --   --   --    < > = values in this interval not displayed.    RADIOGRAPHIC STUDIES: I have personally reviewed the radiological images  as listed and agreed with the findings in the report. No results found.  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  I GVH mutated. On Ibrutinib 420 mg/day; improved; continue ibrutinib. Improved.  # Bilateral PE-stable.  On eliquis 5 mg BID.  Recommend holding of any biking given the risk of fall.  # CKD-stage III improved.  # HTN- ~140 at home; restart lisinopril; continue to hold off hydrochlorothiazide.  # Constipation: improved; on Miralax as needed.   DISPOSITION:  # Follow up in 4 weeks/labs-cbc/bmp-Dr.B  Cc; Dr.Tate  All questions were answered. The patient knows to call the clinic with any problems, questions or concerns.     Cammie Sickle, MD 02/19/2018 3:29 PM

## 2018-02-28 MED FILL — IMBRUVICA 420 MG TAB: 420 | 28 days supply | Qty: 28 | Fill #2

## 2018-03-13 ENCOUNTER — Other Ambulatory Visit: Payer: Self-pay

## 2018-03-13 DIAGNOSIS — C8308 Small cell B-cell lymphoma, lymph nodes of multiple sites: Secondary | ICD-10-CM

## 2018-03-19 ENCOUNTER — Inpatient Hospital Stay: Payer: Medicare Other

## 2018-03-19 ENCOUNTER — Inpatient Hospital Stay: Payer: Medicare Other | Attending: Internal Medicine | Admitting: Internal Medicine

## 2018-03-19 ENCOUNTER — Other Ambulatory Visit: Payer: Self-pay

## 2018-03-19 VITALS — BP 174/78 | HR 82 | Temp 98.1°F | Resp 20 | Ht 73.0 in | Wt 219.4 lb

## 2018-03-19 DIAGNOSIS — Z7901 Long term (current) use of anticoagulants: Secondary | ICD-10-CM | POA: Insufficient documentation

## 2018-03-19 DIAGNOSIS — N182 Chronic kidney disease, stage 2 (mild): Secondary | ICD-10-CM | POA: Diagnosis not present

## 2018-03-19 DIAGNOSIS — Z87891 Personal history of nicotine dependence: Secondary | ICD-10-CM | POA: Insufficient documentation

## 2018-03-19 DIAGNOSIS — I129 Hypertensive chronic kidney disease with stage 1 through stage 4 chronic kidney disease, or unspecified chronic kidney disease: Secondary | ICD-10-CM | POA: Insufficient documentation

## 2018-03-19 DIAGNOSIS — E1122 Type 2 diabetes mellitus with diabetic chronic kidney disease: Secondary | ICD-10-CM | POA: Diagnosis not present

## 2018-03-19 DIAGNOSIS — C8308 Small cell B-cell lymphoma, lymph nodes of multiple sites: Secondary | ICD-10-CM | POA: Diagnosis present

## 2018-03-19 DIAGNOSIS — I2699 Other pulmonary embolism without acute cor pulmonale: Secondary | ICD-10-CM | POA: Diagnosis not present

## 2018-03-19 LAB — CBC WITH DIFFERENTIAL/PLATELET
Abs Immature Granulocytes: 0.04 10*3/uL (ref 0.00–0.07)
Basophils Absolute: 0.1 10*3/uL (ref 0.0–0.1)
Basophils Relative: 1 %
Eosinophils Absolute: 0.1 10*3/uL (ref 0.0–0.5)
Eosinophils Relative: 1 %
HCT: 41.7 % (ref 39.0–52.0)
Hemoglobin: 13.4 g/dL (ref 13.0–17.0)
Immature Granulocytes: 0 %
Lymphocytes Relative: 40 %
Lymphs Abs: 5.1 10*3/uL — ABNORMAL HIGH (ref 0.7–4.0)
MCH: 27.5 pg (ref 26.0–34.0)
MCHC: 32.1 g/dL (ref 30.0–36.0)
MCV: 85.6 fL (ref 80.0–100.0)
Monocytes Absolute: 0.7 10*3/uL (ref 0.1–1.0)
Monocytes Relative: 5 %
Neutro Abs: 6.8 10*3/uL (ref 1.7–7.7)
Neutrophils Relative %: 53 %
Platelets: 264 10*3/uL (ref 150–400)
RBC: 4.87 MIL/uL (ref 4.22–5.81)
RDW: 15.1 % (ref 11.5–15.5)
WBC: 12.8 10*3/uL — ABNORMAL HIGH (ref 4.0–10.5)
nRBC: 0 % (ref 0.0–0.2)

## 2018-03-19 LAB — COMPREHENSIVE METABOLIC PANEL
ALT: 13 U/L (ref 0–44)
AST: 18 U/L (ref 15–41)
Albumin: 3.7 g/dL (ref 3.5–5.0)
Alkaline Phosphatase: 49 U/L (ref 38–126)
Anion gap: 10 (ref 5–15)
BUN: 23 mg/dL (ref 8–23)
CO2: 25 mmol/L (ref 22–32)
Calcium: 8.9 mg/dL (ref 8.9–10.3)
Chloride: 103 mmol/L (ref 98–111)
Creatinine, Ser: 1.15 mg/dL (ref 0.61–1.24)
GFR calc Af Amer: >60 mL/min (ref >60)
GFR calc non Af Amer: >60 mL/min (ref >60)
Glucose, Bld: 151 mg/dL — ABNORMAL HIGH (ref 70–99)
Potassium: 4.4 mmol/L (ref 3.5–5.1)
Sodium: 138 mmol/L (ref 135–145)
Total Bilirubin: 0.5 mg/dL (ref 0.3–1.2)
Total Protein: 6.6 g/dL (ref 6.5–8.1)

## 2018-03-19 LAB — URIC ACID: Uric Acid, Serum: 3.6 mg/dL — ABNORMAL LOW (ref 3.7–8.6)

## 2018-03-19 LAB — PHOSPHORUS: Phosphorus: 3.7 mg/dL (ref 2.5–4.6)

## 2018-03-19 LAB — LACTATE DEHYDROGENASE: LDH: 142 U/L (ref 98–192)

## 2018-03-19 MED ORDER — AMLODIPINE BESYLATE 5 MG PO TABS: 5.0000 mg | ORAL_TABLET | Freq: Every day | ORAL | 6 refills | Status: DC

## 2018-03-19 NOTE — Assessment & Plan Note (Addendum)
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 On Ibrutinib 420 mg/day; partial response noted.  Stable.  Hemoglobin platelets normal at 13.  White count pending.  # Continue ibrutinib-tolerating well except elevated blood pressure. Will plan re-imaging in approximately 1 month from now.  # Bilateral PE On eliquis 5 mg BID. Stable.   # CKD-stage-II/stable.  Awaiting renal function today.  # HTN- ~140-160s at home-poorly controlled secondary to ibrutinib.; currently on lisinopril 40 mg a day.  Add norvasc 5 mg/day.   DISPOSITION:  # Follow up in 4 weeks/labs-cbc/bmp/ldh/ CT scans 1-2 days prior /[place]- Dr.B  Cc; Dr.Tate

## 2018-03-19 NOTE — Progress Notes (Signed)
[place] NOTE  Patient Care Team: [name], MD as PCP - General (Internal Medicine)  CHIEF COMPLAINTS/PURPOSE OF CONSULTATION:  SMALL CELL LYMPHOMA  #  Oncology History   # October 2019- SMALL CELL LYMPHOMA; ki-67-10%. STAGE- III/ IV; PET scan- bulky LN above/below Diaphragm; No spleen/liver/bone;   # 22nd October 2019- Bil PE on lovenox xstopped; NOV 2019- Eliquis 5 mg BID.   # DM/HTN/ CKD- stage III -----------------------------------------------   MOLECULAR TESTING: Trisomy 12. 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 [name] 68 y.o.  male above new diagnosis of SLL/CLL on ibrutinib; incidental bilateral PE is here for follow-up.  Patient continues to notice improvement of his lymph nodes in his neck and his underarms.  Appetite is good.  No unusual fatigue or joint pains.  Denies any muscle aches.  Patient continues to be on Eliquis for his bilateral PE.  Denies any worsening shortness of breath or cough.  Denies any easy bruising or bleeding.   Review of Systems  Constitutional: Negative for chills, diaphoresis, fever, malaise/fatigue and weight loss.  HENT: Negative for sore throat.   Eyes: Negative for double vision.  Respiratory: Negative for cough, hemoptysis, sputum production, shortness of breath and wheezing.   Cardiovascular: Negative for chest pain, palpitations, orthopnea and leg swelling.  Gastrointestinal: Negative for abdominal pain, blood in stool, constipation, diarrhea, heartburn and melena.  Genitourinary: Negative for  dysuria, frequency and urgency.  Musculoskeletal: Negative for back pain and joint pain.  Skin: Negative.  Negative for itching and rash.  Neurological: Negative for dizziness, tingling, focal weakness, weakness and headaches.  Endo/Heme/Allergies: Does not bruise/bleed easily.  Psychiatric/Behavioral: Negative for depression. The patient is not nervous/anxious and does not have insomnia.      MEDICAL HISTORY:  Past Medical History:  Diagnosis Date  . Anxiety   . Arthritis   . 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 HOH (hard of hearing)   . Hypertension   . Osteomyelitis of toe ([facility]) 06/08/11   right foot  . Seasonal allergies   . Type II diabetes mellitus ([place])    diet controlled    SURGICAL HISTORY: Past Surgical History:  Procedure Laterality Date  . AMPUTATION  06/08/2011   Procedure: AMPUTATION RAY;  Surgeon: Wylene Simmer, MD;  Location: [facility];  Service: Orthopedics;  Laterality: Right;  [place] Amputation  . [place]   "crushed"  . FRACTURE SURGERY    . 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 PILONIDAL CYST / SINUS EXCISION  1970's  . TOE AMPUTATION  06/08/11   partial; right great toe    SOCIAL HISTORY: Social History   Socioeconomic History  . Marital status: Married    Spouse name: Not on file  . Number of children: Not on file  . Years of education: Not on file  . Highest education level: Not on file  Occupational History  . Not on file  Social Needs  .  Financial resource strain: Not on file  . Food insecurity:    Worry: Not on file    Inability: Not on file  . Transportation needs:    Medical: Not on file    Non-medical: Not on file  Tobacco Use  . Smoking status: Former Smoker    Packs/day: 1.50    Years: 22.00    Pack years: 33.00    Types: Cigarettes    Last attempt to quit: 05/24/2011    Years since quitting: 6.8  . Smokeless tobacco: Former Systems developer    Types: [place] date: 03/07/1983  Substance and Sexual  Activity  . Alcohol use: Yes    Comment: 06/08/11 "no liquor for 2 1/2 years; last beer 05/31/11"  . Drug use: No  . Sexual activity: Yes  Lifestyle  . Physical activity:    Days per week: Not on file    Minutes per session: Not on file  . Stress: Not on file  Relationships  . Social connections:    Talks on phone: Not on file    Gets together: Not on file    Attends religious service: Not on file    Active member of club or organization: Not on file    Attends meetings of clubs or organizations: Not on file    Relationship status: Not on file  . Intimate partner violence:    Fear of current or ex partner: Not on file    Emotionally abused: Not on file    Physically abused: Not on file    Forced sexual activity: Not on file  Other Topics Concern  . Not on file  Social History Narrative   Married    FAMILY HISTORY: Family History  Problem Relation Age of Onset  . Prostate cancer Father   . Hypertension Father   . Other Mother        VARICOSE VEINS    ALLERGIES:  is allergic to penicillins.  MEDICATIONS:  Current Outpatient Medications  Medication Sig Dispense Refill  . allopurinol (ZYLOPRIM) 300 MG tablet Take 1 tablet (300 mg total) by mouth 2 (two) times daily. 120 tablet 3  . apixaban (ELIQUIS) 5 MG TABS tablet Take 1 tablet (5 mg total) by mouth 2 (two) times daily. 60 tablet 5  . ECHINACEA PO Take 760 mg by mouth daily.    . fluticasone (FLONASE) 50 MCG/ACT nasal spray Place 2 sprays into both nostrils daily as needed for allergies or rhinitis.    . Ginseng 100 MG CAPS Take 100 mg by mouth daily.    . Ibrutinib 420 MG TABS Take 420 mg by mouth daily. 28 tablet 3  . JANUVIA 100 MG tablet Take 100 mg by mouth daily.  3  . lisinopril (PRINIVIL,ZESTRIL) 40 MG tablet Take 40 mg by mouth daily.    . metFORMIN (GLUCOPHAGE) 500 MG tablet Take 1,000 mg by mouth 2 (two) times daily.    . Omega-3 Fatty Acids (OMEGA 3 500) 500 MG CAPS Take 500 mg by mouth daily.    . ONE  TOUCH ULTRA TEST test strip USE 1 STRIP TO CHECK GLUCOSE ONCE A WEEK  99  . pravastatin (PRAVACHOL) 40 MG tablet Take 40 mg by mouth daily.    . Saw Palmetto 450 MG CAPS Take 450 mg by mouth daily.    Marland Kitchen amLODipine (NORVASC) 5 MG tablet Take 1 tablet (5 mg total) by mouth daily. 30 tablet 6   No current facility-administered medications for this  visit.       Marland Kitchen  PHYSICAL EXAMINATION: ECOG PERFORMANCE STATUS: 1 - Symptomatic but completely ambulatory  Vitals:   03/19/18 1429 03/19/18 1430  BP: (!) 201/83 (!) 174/78  Pulse: 82   Resp: 20   Temp: 98.1 F (36.7 C)    Filed Weights   03/19/18 1429  Weight: 219 lb 5.7 oz (99.5 kg)    Physical Exam  Constitutional: He is oriented to person, place, and time and well-developed, well-nourished, and in no distress.  Accompanied by his wife.  Bilateral hard of hearing.  HENT:  Head: Normocephalic and atraumatic.  Mouth/Throat: Oropharynx is clear and moist. No oropharyngeal exudate.  Eyes: Pupils are equal, round, and reactive to light.  Neck: Normal range of motion. Neck supple.  Cardiovascular: Normal rate and regular rhythm.  Pulmonary/Chest: No respiratory distress. He has no wheezes.  Abdominal: Soft. Bowel sounds are normal. He exhibits no distension and no mass. There is no abdominal tenderness. There is no rebound and no guarding.  Musculoskeletal: Normal range of motion.        General: No tenderness or edema.  Lymphadenopathy:  Significant improvement of the lymphadenopathy in the neck/resolved; scattered approximately 1 cm few lymph nodes present in the right left underarm.  Neurological: He is alert and oriented to person, place, and time.  Skin: Skin is warm.  Psychiatric: Affect normal.     LABORATORY DATA:  I have reviewed the data as listed Lab Results  Component Value Date   WBC PENDING 03/19/2018   HGB 13.4 03/19/2018   HCT 41.7 03/19/2018   MCV 85.6 03/19/2018   PLT 264 03/19/2018   Recent Labs     12/26/17 1000  01/18/18 1037 01/29/18 1320 02/19/18 1324  NA  --    < > 136 136 136  K  --    < > 5.0 4.5 4.4  CL  --    < > 103 103 101  CO2  --    < > '25 27 27  ' GLUCOSE  --    < > 203* 168* 168*  BUN  --    < > 54* 29* 20  CREATININE  --    < > 1.75* 1.23 1.18  CALCIUM  --    < > 9.5 9.0 8.6*  GFRNONAA  --    < > 39* >60 >60  GFRAA  --    < > 45* >60 >60  PROT 6.7  --   --   --   --   ALBUMIN 4.2  --   --   --   --   AST 17  --   --   --   --   ALT 14  --   --   --   --   ALKPHOS 63  --   --   --   --   BILITOT 0.6  --   --   --   --   BILIDIR 0.1  --   --   --   --   IBILI 0.5  --   --   --   --    < > = values in this interval not displayed.    RADIOGRAPHIC STUDIES: I have personally reviewed the radiological images as listed and agreed with the findings in the report. No results found.  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  I GVH mutated. On Ibrutinib 420 mg/day; partial response noted.  Stable.  Hemoglobin platelets normal at 13.  White count pending.  # Continue ibrutinib-tolerating well except elevated blood pressure. Will plan re-imaging in approximately 1 month from now.  # Bilateral PE On eliquis 5 mg BID. Stable.   # CKD-stage-II/stable.  Awaiting renal function today.  # HTN- ~140-160s at home-poorly controlled secondary to ibrutinib.; currently on lisinopril 40 mg a day.  Add norvasc 5 mg/day.   DISPOSITION:  # Follow up in 4 weeks/labs-cbc/bmp/ldh/ CT scans 1-2 days prior /[place]- Dr.B  Cc; Dr.Tate  All questions were answered. The patient knows to call the clinic with any problems, questions or concerns.     Cammie Sickle, MD 03/19/2018 3:16 PM

## 2018-03-27 MED FILL — IMBRUVICA 420 MG TAB: 420 | 28 days supply | Qty: 28 | Fill #3

## 2018-04-12 ENCOUNTER — Ambulatory Visit
Admission: RE | Admit: 2018-04-12 | Discharge: 2018-04-12 | Disposition: A | Discharge status: Home / Self Care | Payer: Medicare Other | Source: Ambulatory Visit | Attending: Internal Medicine | Admitting: Internal Medicine

## 2018-04-12 DIAGNOSIS — C8308 Small cell B-cell lymphoma, lymph nodes of multiple sites: Secondary | ICD-10-CM | POA: Insufficient documentation

## 2018-04-12 MED ORDER — IOPAMIDOL (ISOVUE-300) INJECTION 61%
100.0000 mL | Freq: Once | INTRAVENOUS | Status: AC | PRN
  Administered 2018-04-12: 100 mL via INTRAVENOUS

## 2018-04-16 ENCOUNTER — Encounter: Payer: Self-pay | Admitting: Internal Medicine

## 2018-04-16 ENCOUNTER — Inpatient Hospital Stay: Payer: Medicare Other

## 2018-04-16 ENCOUNTER — Other Ambulatory Visit: Payer: Self-pay

## 2018-04-16 ENCOUNTER — Inpatient Hospital Stay: Payer: Medicare Other | Attending: Internal Medicine | Admitting: Internal Medicine

## 2018-04-16 VITALS — BP 169/87 | HR 88 | Temp 97.9°F | Resp 20 | Ht 73.0 in | Wt 218.0 lb

## 2018-04-16 DIAGNOSIS — Z7901 Long term (current) use of anticoagulants: Secondary | ICD-10-CM | POA: Insufficient documentation

## 2018-04-16 DIAGNOSIS — C8308 Small cell B-cell lymphoma, lymph nodes of multiple sites: Secondary | ICD-10-CM

## 2018-04-16 DIAGNOSIS — I129 Hypertensive chronic kidney disease with stage 1 through stage 4 chronic kidney disease, or unspecified chronic kidney disease: Secondary | ICD-10-CM | POA: Diagnosis not present

## 2018-04-16 DIAGNOSIS — N182 Chronic kidney disease, stage 2 (mild): Secondary | ICD-10-CM | POA: Insufficient documentation

## 2018-04-16 DIAGNOSIS — E1122 Type 2 diabetes mellitus with diabetic chronic kidney disease: Secondary | ICD-10-CM | POA: Insufficient documentation

## 2018-04-16 DIAGNOSIS — Z87891 Personal history of nicotine dependence: Secondary | ICD-10-CM | POA: Diagnosis not present

## 2018-04-16 DIAGNOSIS — I2699 Other pulmonary embolism without acute cor pulmonale: Secondary | ICD-10-CM

## 2018-04-16 LAB — CBC WITH DIFFERENTIAL/PLATELET
Abs Immature Granulocytes: 0.08 10*3/uL — ABNORMAL HIGH (ref 0.00–0.07)
Basophils Absolute: 0.1 10*3/uL (ref 0.0–0.1)
Basophils Relative: 1 %
Eosinophils Absolute: 0 10*3/uL (ref 0.0–0.5)
Eosinophils Relative: 0 %
HCT: 39.9 % (ref 39.0–52.0)
Hemoglobin: 12.4 g/dL — ABNORMAL LOW (ref 13.0–17.0)
Immature Granulocytes: 1 %
Lymphocytes Relative: 32 %
Lymphs Abs: 3.5 10*3/uL (ref 0.7–4.0)
MCH: 26.4 pg (ref 26.0–34.0)
MCHC: 31.1 g/dL (ref 30.0–36.0)
MCV: 84.9 fL (ref 80.0–100.0)
Monocytes Absolute: 0.6 10*3/uL (ref 0.1–1.0)
Monocytes Relative: 6 %
Neutro Abs: 6.6 10*3/uL (ref 1.7–7.7)
Neutrophils Relative %: 60 %
Platelets: 290 10*3/uL (ref 150–400)
RBC: 4.7 MIL/uL (ref 4.22–5.81)
RDW: 15.2 % (ref 11.5–15.5)
WBC: 10.9 10*3/uL — ABNORMAL HIGH (ref 4.0–10.5)
nRBC: 0 % (ref 0.0–0.2)

## 2018-04-16 LAB — COMPREHENSIVE METABOLIC PANEL
ALT: 12 U/L (ref 0–44)
AST: 13 U/L — ABNORMAL LOW (ref 15–41)
Albumin: 3.8 g/dL (ref 3.5–5.0)
Alkaline Phosphatase: 79 U/L (ref 38–126)
Anion gap: 6 (ref 5–15)
BUN: 21 mg/dL (ref 8–23)
CO2: 28 mmol/L (ref 22–32)
Calcium: 9.2 mg/dL (ref 8.9–10.3)
Chloride: 104 mmol/L (ref 98–111)
Creatinine, Ser: 1.22 mg/dL (ref 0.61–1.24)
GFR calc Af Amer: >60 mL/min (ref >60)
GFR calc non Af Amer: >60 mL/min (ref >60)
Glucose, Bld: 159 mg/dL — ABNORMAL HIGH (ref 70–99)
Potassium: 4.5 mmol/L (ref 3.5–5.1)
Sodium: 138 mmol/L (ref 135–145)
Total Bilirubin: 0.5 mg/dL (ref 0.3–1.2)
Total Protein: 6.8 g/dL (ref 6.5–8.1)

## 2018-04-16 LAB — LACTATE DEHYDROGENASE: LDH: 142 U/L (ref 98–192)

## 2018-04-16 MED ORDER — IBRUTINIB 420 MG PO TABS: 420.0000 mg | ORAL_TABLET | Freq: Every day | ORAL | 3 refills | Status: DC

## 2018-04-16 NOTE — Assessment & Plan Note (Addendum)
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 On Ibrutinib 420 mg/day; FEB 2020- CT N/C/A/P- significant response noted.  Hemoglobin platelets normal at 13.  White count-11. Will refill ibrutinib.   # Continue Ibrutinib for now.  Tolerating fairly well.  # Bilateral PE On eliquis 5 mg BID. Stable; no excessive bleeding noted.   # CKD-stage-II/-STABLE.    # HTN- ~140-150s- sec to  ibrutinib.; currently on lisinopril 40 mg a day;  norvasc 5 mg/day. STABLE: reviewed the home log.   #Physical activity patient wants to ride his bike.  I think is reasonable as long as he wears his helmet and careful about not falling.  Patient's wife in agreement.  DISPOSITION:  # Follow up in 4 weeks/labs-cbc/bmp/ldh- Dr.B  # I reviewed the blood work- with the patient in detail; also reviewed the imaging independently [as summarized above]; and with the patient in detail.   # 25 minutes face-to-face with the patient discussing the above plan of care; more than 50% of time spent on prognosis/ natural history; counseling and coordination.  Cc; Dr.Tate

## 2018-04-16 NOTE — Progress Notes (Signed)
[name] NOTE  Patient Care Team: [name], MD as PCP - General (Internal Medicine)  CHIEF COMPLAINTS/PURPOSE OF CONSULTATION:  SMALL CELL LYMPHOMA  #  Oncology History   # October 2019- SMALL CELL LYMPHOMA; ki-67-10%. STAGE- III/ IV; PET scan- bulky LN above/below Diaphragm; No spleen/liver/bone;   # 22nd October 2019- Bil PE on lovenox xstopped; NOV 2019- Eliquis 5 mg BID.   # DM/HTN/ CKD- stage III -----------------------------------------------   MOLECULAR TESTING: Trisomy 12. 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 [name] 68 y.o.  male above new diagnosis of SLL/CLL on ibrutinib; incidental bilateral PE is here for follow-up.  Patient denies any joint pains.  Denies any muscle ache.  Denies any easy bruising or bleeding.  He continues to been Eliquis for his bilateral PE.  No falls.  He is frustrated that he cannot ride his bike.  Review of Systems  Constitutional: Negative for chills, diaphoresis, fever, malaise/fatigue and weight loss.  HENT: Negative for sore throat.   Eyes: Negative for double vision.  Respiratory: Negative for cough, hemoptysis, sputum production, shortness of breath and wheezing.   Cardiovascular: Negative for chest pain, palpitations, orthopnea and leg swelling.  Gastrointestinal: Negative for abdominal pain, blood in stool, constipation, diarrhea, heartburn and melena.  Genitourinary: Negative for dysuria, frequency and urgency.  Musculoskeletal: Negative for back pain and joint pain.  Skin: Negative.  Negative for  itching and rash.  Neurological: Negative for dizziness, tingling, focal weakness, weakness and headaches.  Endo/Heme/Allergies: Does not bruise/bleed easily.  Psychiatric/Behavioral: Negative for depression. The patient is not nervous/anxious and does not have insomnia.      MEDICAL HISTORY:  Past Medical History:  Diagnosis Date  . Anxiety   . Arthritis   . 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 HOH (hard of hearing)   . Hypertension   . Osteomyelitis of toe ([place]) 06/08/11   right foot  . Seasonal allergies   . Type II diabetes mellitus ([place])    diet controlled    SURGICAL HISTORY: Past Surgical History:  Procedure Laterality Date  . AMPUTATION  06/08/2011   Procedure: AMPUTATION RAY;  Surgeon: Wylene Simmer, MD;  Location: [facility];  Service: Orthopedics;  Laterality: Right;  [place] Amputation  . [place]   "crushed"  . FRACTURE SURGERY    . 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 PILONIDAL CYST / SINUS EXCISION  1970's  . TOE AMPUTATION  06/08/11   partial; right great toe    SOCIAL HISTORY: Social History   Socioeconomic History  . Marital status: Married    Spouse name: Not on file  . Number of children: Not on file  . Years of education: Not on file  . Highest education level: Not on file  Occupational History  . Not on file  Social Needs  . Financial resource strain: Not on file  . Food insecurity:    Worry: Not on file    Inability:  Not on file  . Transportation needs:    Medical: Not on file    Non-medical: Not on file  Tobacco Use  . Smoking status: Former Smoker    Packs/day: 1.50    Years: 22.00    Pack years: 33.00    Types: Cigarettes    Last attempt to quit: 05/24/2011    Years since quitting: 6.9  . Smokeless tobacco: Former Systems developer    Types: [place] date: 03/07/1983  Substance and Sexual Activity  . Alcohol use: Yes    Comment: 06/08/11 "no liquor for 2 1/2 years; last beer 05/31/11"  . Drug use: No  . Sexual  activity: Yes  Lifestyle  . Physical activity:    Days per week: Not on file    Minutes per session: Not on file  . Stress: Not on file  Relationships  . Social connections:    Talks on phone: Not on file    Gets together: Not on file    Attends religious service: Not on file    Active member of club or organization: Not on file    Attends meetings of clubs or organizations: Not on file    Relationship status: Not on file  . Intimate partner violence:    Fear of current or ex partner: Not on file    Emotionally abused: Not on file    Physically abused: Not on file    Forced sexual activity: Not on file  Other Topics Concern  . Not on file  Social History Narrative   Married    FAMILY HISTORY: Family History  Problem Relation Age of Onset  . Prostate cancer Father   . Hypertension Father   . Other Mother        VARICOSE VEINS    ALLERGIES:  is allergic to penicillins.  MEDICATIONS:  Current Outpatient Medications  Medication Sig Dispense Refill  . allopurinol (ZYLOPRIM) 300 MG tablet Take 1 tablet (300 mg total) by mouth 2 (two) times daily. 120 tablet 3  . amLODipine (NORVASC) 5 MG tablet Take 1 tablet (5 mg total) by mouth daily. 30 tablet 6  . apixaban (ELIQUIS) 5 MG TABS tablet Take 1 tablet (5 mg total) by mouth 2 (two) times daily. 60 tablet 5  . ECHINACEA PO Take 760 mg by mouth daily.    . fluticasone (FLONASE) 50 MCG/ACT nasal spray Place 2 sprays into both nostrils daily as needed for allergies or rhinitis.    . Ginseng 100 MG CAPS Take 100 mg by mouth daily.    . Ibrutinib 420 MG TABS Take 420 mg by mouth daily. 28 tablet 3  . JANUVIA 100 MG tablet Take 100 mg by mouth daily.  3  . lisinopril (PRINIVIL,ZESTRIL) 40 MG tablet Take 40 mg by mouth daily.    . metFORMIN (GLUCOPHAGE) 500 MG tablet Take 1,000 mg by mouth 2 (two) times daily.    . Omega-3 Fatty Acids (OMEGA 3 500) 500 MG CAPS Take 500 mg by mouth daily.    . ONE TOUCH ULTRA TEST test strip USE 1  STRIP TO CHECK GLUCOSE ONCE A WEEK  99  . pravastatin (PRAVACHOL) 40 MG tablet Take 40 mg by mouth daily.    . Saw Palmetto 450 MG CAPS Take 450 mg by mouth daily.    . mupirocin ointment (BACTROBAN) 2 % APPLY WITH Q TIP THREE TIMES DAILY AS NEEDED FOR DRYNESS AND CRUSTING  3   No current facility-administered medications for  this visit.       Marland Kitchen  PHYSICAL EXAMINATION: ECOG PERFORMANCE STATUS: 1 - Symptomatic but completely ambulatory  Vitals:   04/16/18 1445 04/16/18 1500  BP: (!) 179/84 (!) 169/87  Pulse: 90 88  Resp: 20   Temp: 97.9 F (36.6 C)    Filed Weights   04/16/18 1457  Weight: 218 lb (98.9 kg)    Physical Exam  Constitutional: He is oriented to person, place, and time and well-developed, well-nourished, and in no distress.  Accompanied by his wife.  Bilateral hard of hearing.  HENT:  Head: Normocephalic and atraumatic.  Mouth/Throat: Oropharynx is clear and moist. No oropharyngeal exudate.  Eyes: Pupils are equal, round, and reactive to light.  Neck: Normal range of motion. Neck supple.  Cardiovascular: Normal rate and regular rhythm.  Pulmonary/Chest: No respiratory distress. He has no wheezes.  Abdominal: Soft. Bowel sounds are normal. He exhibits no distension and no mass. There is no abdominal tenderness. There is no rebound and no guarding.  Musculoskeletal: Normal range of motion.        General: No tenderness or edema.  Lymphadenopathy:  Significant improvement of the lymphadenopathy in the neck/resolved; scattered approximately 1 cm few lymph nodes present in the right left underarm.  Neurological: He is alert and oriented to person, place, and time.  Skin: Skin is warm.  Psychiatric: Affect normal.     LABORATORY DATA:  I have reviewed the data as listed Lab Results  Component Value Date   WBC 10.9 (H) 04/16/2018   HGB 12.4 (L) 04/16/2018   HCT 39.9 04/16/2018   MCV 84.9 04/16/2018   PLT 290 04/16/2018   Recent Labs    12/26/17 1000   02/19/18 1324 03/19/18 1414 04/16/18 1407  NA  --    < > 136 138 138  K  --    < > 4.4 4.4 4.5  CL  --    < > 101 103 104  CO2  --    < > _0 GLUCOSE  --    < > 168* 151* 159*  BUN  --    < > _1 CREATININE  --    < > 1.18 1.15 1.22  CALCIUM  --    < > 8.6* 8.9 9.2  GFRNONAA  --    < > >60 >60 >60  GFRAA  --    < > >60 >60 >60  PROT 6.7  --   --  6.6 6.8  ALBUMIN 4.2  --   --  3.7 3.8  AST 17  --   --  18 13*  ALT 14  --   --  13 12  ALKPHOS 63  --   --  49 79  BILITOT 0.6  --   --  0.5 0.5  BILIDIR 0.1  --   --   --   --   IBILI 0.5  --   --   --   --    < > = values in this interval not displayed.    RADIOGRAPHIC STUDIES: I have personally reviewed the radiological images as listed and agreed with the findings in the report. Ct Soft Tissue Neck W Contrast  Result Date: 04/12/2018 CLINICAL DATA:  B-cell lymphoma.  Follow-up. EXAM: CT NECK WITH CONTRAST TECHNIQUE: Multidetector CT imaging of the neck was performed using the standard protocol following the bolus administration of intravenous contrast. CONTRAST:  115m ISOVUE-300 IOPAMIDOL (ISOVUE-300) INJECTION 61% COMPARISON:  12/25/2017 FINDINGS: Pharynx and larynx: No mucosal or submucosal lesion. Salivary glands: Parotid and submandibular glands are normal. Intraparotid nodes previously seen are no longer visible. Thyroid: Normal Lymph nodes: Marked positive response to therapy. Bulky lymphadenopathy throughout both sides of the neck has markedly diminished. Very slight prominence of the level 2, level 3, level 4 and level 5 lymph nodes. For example, 5.8 cm in diameter level 5 nodal mass on the previous study now measures 1 cm in diameter. Similar reduction and nodal volume throughout. Slight multiplicity of bilateral supraclavicular nodes, but markedly reduced in volume when compared to the previous study. No new or progressive nodes. Vascular: Normal Limited intracranial: Normal Visualized orbits: Normal Mastoids and  visualized paranasal sinuses: Small mastoid effusions. Paranasal sinuses are clear. Skeleton: Ordinary spondylosis and facet osteoarthritis. Upper chest: See results of chest CT. Other: None IMPRESSION: Marked positive response to therapy. Massive widespread bilateral cervical lymphadenopathy has diminished. Estimated volume decreased by 90% patient does still show a multiplicity of slightly prominent nodes throughout both sides of the neck, but none are new or enlarging. Electronically Signed   By: Nelson Chimes M.D.   On: 04/12/2018 12:54   Ct Chest W Contrast  Result Date: 04/12/2018 CLINICAL DATA:  Small B-cell lymphocytic lymphoma restaging. 15 pound weight loss. Intermittent hemoptysis. EXAM: CT CHEST, ABDOMEN, AND PELVIS WITH CONTRAST TECHNIQUE: Multidetector CT imaging of the chest, abdomen and pelvis was performed following the standard protocol during bolus administration of intravenous contrast. CONTRAST:  124m ISOVUE-300 IOPAMIDOL (ISOVUE-300) INJECTION 61% COMPARISON:  Multiple exams, including PET-CT from 01/01/2018 and diagnostic CT from 12/25/2017 FINDINGS: CT CHEST FINDINGS Cardiovascular: Coronary, aortic arch, and branch vessel atherosclerotic vascular disease. Mild cardiomegaly. Although today's exam was not performed as a pulmonary embolus CT angiogram, we do not demonstrate obvious filling defect in the pulmonary arterial tree centrally. Mediastinum/Nodes: Significant improvement in prior supraclavicular, axillary, subpectoral, and mediastinal adenopathy. An index left axillary node on image 16/4 measures 1.8 cm in short axis, previously 4.8 cm. An index right paratracheal node measures 0.4 cm in short axis on image 21/4, previously 1.6 cm. An index right axillary lymph node measures 1.4 cm in short axis on image 13/4, previously 4.4 cm. An index left supraclavicular node measures 1.2 cm in short axis on image 7/4, formerly 2.2 cm. Lungs/Pleura: 0.7 by 0.6 cm subpleural nodule along the  right upper lobe on image 64/6, unchanged. 0.6 by 0.5 cm left lower lobe subpleural nodule on image 105/6, unchanged. Stable subpleural nodularity along the left hemidiaphragm. Musculoskeletal: Chronic degenerative sternoclavicular arthropathy. Thoracic spondylosis with multilevel bridging spurring. CT ABDOMEN PELVIS FINDINGS Hepatobiliary: Unremarkable Pancreas: Unremarkable Spleen: Unremarkable.  No splenomegaly. 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 Hazy right suprarenal nodule on image 101/8 measuring 1.3 by 1.1 cm, roughly stable, not previously hypermetabolic, possibly a peripheral adrenal adenoma or a small cyst although continuity with the adrenal gland and kidney is not well shown. A smaller adenoma of the medial limb of the left adrenal gland is present. Stomach/Bowel: Prominent stool throughout the colon favors constipation. Vascular/Lymphatic: Aortoiliac atherosclerotic vascular disease. Significant improvement in the abdominal and pelvic adenopathy. For example, an individual left periaortic lymph node measures 1.9 cm in short axis on image 78/4, formerly 3.2 cm. However, there still some bulky adenopathy observed. A left lower para-aortic lymph node measures 4.1 cm in short axis on image 95/4, previously 5.2 cm. A left common iliac node measures 3.4 cm in short axis on image 107/4, previously  4.5 cm. A left external iliac node measures 3.9 cm in short axis on image 117/4, previously 6.8 cm. Similar improvement is present throughout the pelvis and retroperitoneum. A peripancreatic node measuring 1.2 cm in short axis on image 62/4 previously measured 3.3 cm. The previous mesenteric adenopathy has likewise considerably improved. Reproductive: Coarse calcifications centrally in the prostate gland. Other: No supplemental non-categorized findings. Musculoskeletal: Lumbar spondylosis and degenerative disc disease causing impingement all levels between L3 and S1.  IMPRESSION: 1. Although not resolved, there has been considerable improvement in the bulky thoracic, abdominal, and pelvic adenopathy as detailed above. Prior splenomegaly has resolved. 2. Other imaging findings of potential clinical significance: Aortic Atherosclerosis (ICD10-I70.0). Coronary atherosclerosis with mild cardiomegaly. Single small bilateral subpleural nodules are stable. Prominent stool throughout the colon favors constipation. Lower lumbar impingement due to spondylosis and degenerative disc disease. Electronically Signed   By: Van Clines M.D.   On: 04/12/2018 13:14   Ct Abdomen Pelvis W Contrast  Result Date: 04/12/2018 CLINICAL DATA:  Small B-cell lymphocytic lymphoma restaging. 15 pound weight loss. Intermittent hemoptysis. EXAM: CT CHEST, ABDOMEN, AND PELVIS WITH CONTRAST TECHNIQUE: Multidetector CT imaging of the chest, abdomen and pelvis was performed following the standard protocol during bolus administration of intravenous contrast. CONTRAST:  143m ISOVUE-300 IOPAMIDOL (ISOVUE-300) INJECTION 61% COMPARISON:  Multiple exams, including PET-CT from 01/01/2018 and diagnostic CT from 12/25/2017 FINDINGS: CT CHEST FINDINGS Cardiovascular: Coronary, aortic arch, and branch vessel atherosclerotic vascular disease. Mild cardiomegaly. Although today's exam was not performed as a pulmonary embolus CT angiogram, we do not demonstrate obvious filling defect in the pulmonary arterial tree centrally. Mediastinum/Nodes: Significant improvement in prior supraclavicular, axillary, subpectoral, and mediastinal adenopathy. An index left axillary node on image 16/4 measures 1.8 cm in short axis, previously 4.8 cm. An index right paratracheal node measures 0.4 cm in short axis on image 21/4, previously 1.6 cm. An index right axillary lymph node measures 1.4 cm in short axis on image 13/4, previously 4.4 cm. An index left supraclavicular node measures 1.2 cm in short axis on image 7/4, formerly 2.2 cm.  Lungs/Pleura: 0.7 by 0.6 cm subpleural nodule along the right upper lobe on image 64/6, unchanged. 0.6 by 0.5 cm left lower lobe subpleural nodule on image 105/6, unchanged. Stable subpleural nodularity along the left hemidiaphragm. Musculoskeletal: Chronic degenerative sternoclavicular arthropathy. Thoracic spondylosis with multilevel bridging spurring. CT ABDOMEN PELVIS FINDINGS Hepatobiliary: Unremarkable Pancreas: Unremarkable Spleen: Unremarkable.  No splenomegaly. 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 Hazy right suprarenal nodule on image 101/8 measuring 1.3 by 1.1 cm, roughly stable, not previously hypermetabolic, possibly a peripheral adrenal adenoma or a small cyst although continuity with the adrenal gland and kidney is not well shown. A smaller adenoma of the medial limb of the left adrenal gland is present. Stomach/Bowel: Prominent stool throughout the colon favors constipation. Vascular/Lymphatic: Aortoiliac atherosclerotic vascular disease. Significant improvement in the abdominal and pelvic adenopathy. For example, an individual left periaortic lymph node measures 1.9 cm in short axis on image 78/4, formerly 3.2 cm. However, there still some bulky adenopathy observed. A left lower para-aortic lymph node measures 4.1 cm in short axis on image 95/4, previously 5.2 cm. A left common iliac node measures 3.4 cm in short axis on image 107/4, previously 4.5 cm. A left external iliac node measures 3.9 cm in short axis on image 117/4, previously 6.8 cm. Similar improvement is present throughout the pelvis and retroperitoneum. A peripancreatic node measuring 1.2 cm in short axis on image  62/4 previously measured 3.3 cm. The previous mesenteric adenopathy has likewise considerably improved. Reproductive: Coarse calcifications centrally in the prostate gland. Other: No supplemental non-categorized findings. Musculoskeletal: Lumbar spondylosis and degenerative disc  disease causing impingement all levels between L3 and S1. IMPRESSION: 1. Although not resolved, there has been considerable improvement in the bulky thoracic, abdominal, and pelvic adenopathy as detailed above. Prior splenomegaly has resolved. 2. Other imaging findings of potential clinical significance: Aortic Atherosclerosis (ICD10-I70.0). Coronary atherosclerosis with mild cardiomegaly. Single small bilateral subpleural nodules are stable. Prominent stool throughout the colon favors constipation. Lower lumbar impingement due to spondylosis and degenerative disc disease. Electronically Signed   By: Van Clines M.D.   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  I GVH mutated. On Ibrutinib 420 mg/day; FEB 2020- CT N/C/A/P- significant response noted.  Hemoglobin platelets normal at 13.  White count-11. Will refill ibrutinib.   # Continue Ibrutinib for now.  Tolerating fairly well.  # Bilateral PE On eliquis 5 mg BID. Stable; no excessive bleeding noted.   # CKD-stage-II/-STABLE.    # HTN- ~140-150s- sec to  ibrutinib.; currently on lisinopril 40 mg a day;  norvasc 5 mg/day. STABLE: reviewed the home log.   #Physical activity patient wants to ride his bike.  I think is reasonable as long as he wears his helmet and careful about not falling.  Patient's wife in agreement.  DISPOSITION:  # Follow up in 4 weeks/labs-cbc/bmp/ldh- Dr.B  # I reviewed the blood work- with the patient in detail; also reviewed the imaging independently [as summarized above]; and with the patient in detail.   # 25 minutes face-to-face with the patient discussing the above plan of care; more than 50% of time spent on prognosis/ natural history; counseling and coordination.  Cc; Dr.Tate  All questions were answered. The patient knows to call the clinic with any problems, questions or concerns.      Cammie Sickle, MD 04/16/2018 4:14 PM

## 2018-04-24 MED FILL — IMBRUVICA 420 MG TAB: 420 | 28 days supply | Qty: 28 | Fill #0

## 2018-05-14 ENCOUNTER — Inpatient Hospital Stay: Payer: Medicare Other | Attending: Internal Medicine

## 2018-05-14 ENCOUNTER — Other Ambulatory Visit: Payer: Self-pay

## 2018-05-14 ENCOUNTER — Inpatient Hospital Stay (HOSPITAL_BASED_OUTPATIENT_CLINIC_OR_DEPARTMENT_OTHER): Payer: Medicare Other | Admitting: Internal Medicine

## 2018-05-14 VITALS — BP 184/89 | HR 86 | Temp 97.6°F | Resp 16 | Wt 216.0 lb

## 2018-05-14 DIAGNOSIS — N182 Chronic kidney disease, stage 2 (mild): Secondary | ICD-10-CM | POA: Diagnosis not present

## 2018-05-14 DIAGNOSIS — Z7901 Long term (current) use of anticoagulants: Secondary | ICD-10-CM | POA: Diagnosis not present

## 2018-05-14 DIAGNOSIS — C8308 Small cell B-cell lymphoma, lymph nodes of multiple sites: Secondary | ICD-10-CM | POA: Diagnosis present

## 2018-05-14 DIAGNOSIS — I129 Hypertensive chronic kidney disease with stage 1 through stage 4 chronic kidney disease, or unspecified chronic kidney disease: Secondary | ICD-10-CM

## 2018-05-14 DIAGNOSIS — E1122 Type 2 diabetes mellitus with diabetic chronic kidney disease: Secondary | ICD-10-CM | POA: Diagnosis not present

## 2018-05-14 DIAGNOSIS — I2699 Other pulmonary embolism without acute cor pulmonale: Secondary | ICD-10-CM | POA: Diagnosis not present

## 2018-05-14 DIAGNOSIS — Z87891 Personal history of nicotine dependence: Secondary | ICD-10-CM | POA: Insufficient documentation

## 2018-05-14 DIAGNOSIS — Z7984 Long term (current) use of oral hypoglycemic drugs: Secondary | ICD-10-CM | POA: Diagnosis not present

## 2018-05-14 LAB — BASIC METABOLIC PANEL
Anion gap: 6 (ref 5–15)
BUN: 28 mg/dL — ABNORMAL HIGH (ref 8–23)
CO2: 26 mmol/L (ref 22–32)
Calcium: 8.8 mg/dL — ABNORMAL LOW (ref 8.9–10.3)
Chloride: 104 mmol/L (ref 98–111)
Creatinine, Ser: 1.21 mg/dL (ref 0.61–1.24)
GFR calc Af Amer: >60 mL/min (ref >60)
GFR calc non Af Amer: >60 mL/min (ref >60)
Glucose, Bld: 179 mg/dL — ABNORMAL HIGH (ref 70–99)
Potassium: 4.4 mmol/L (ref 3.5–5.1)
Sodium: 136 mmol/L (ref 135–145)

## 2018-05-14 LAB — CBC WITH DIFFERENTIAL/PLATELET
Abs Immature Granulocytes: 0.05 10*3/uL (ref 0.00–0.07)
Basophils Absolute: 0.1 10*3/uL (ref 0.0–0.1)
Basophils Relative: 1 %
Eosinophils Absolute: 0.1 10*3/uL (ref 0.0–0.5)
Eosinophils Relative: 1 %
HCT: 41.3 % (ref 39.0–52.0)
Hemoglobin: 13.1 g/dL (ref 13.0–17.0)
Immature Granulocytes: 1 %
Lymphocytes Relative: 32 %
Lymphs Abs: 3.4 10*3/uL (ref 0.7–4.0)
MCH: 26.8 pg (ref 26.0–34.0)
MCHC: 31.7 g/dL (ref 30.0–36.0)
MCV: 84.5 fL (ref 80.0–100.0)
Monocytes Absolute: 0.8 10*3/uL (ref 0.1–1.0)
Monocytes Relative: 7 %
Neutro Abs: 6.2 10*3/uL (ref 1.7–7.7)
Neutrophils Relative %: 58 %
Platelets: 260 10*3/uL (ref 150–400)
RBC: 4.89 MIL/uL (ref 4.22–5.81)
RDW: 15.2 % (ref 11.5–15.5)
Smear Review: ADEQUATE
WBC: 10.5 10*3/uL (ref 4.0–10.5)
nRBC: 0 % (ref 0.0–0.2)

## 2018-05-14 LAB — LACTATE DEHYDROGENASE: LDH: 143 U/L (ref 98–192)

## 2018-05-14 NOTE — Assessment & Plan Note (Addendum)
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 On Ibrutinib 420 mg/day; FEB 2020- CT N/C/A/P- significant response noted.  Hemoglobin platelets normal at 13.  White count-10.   # Continue Ibrutinib for now.  Tolerating fairly well.  Stable  # HTN poorly controlled- at home-140-150s- sec to  ibrutinib.; currently on lisinopril 40 mg a day/  norvasc 5 mg/day.  Stable  # Bilateral PE On eliquis 5 mg BID stable no excessive bleeding noted..   # CKD-stage-II/-stable  DISPOSITION:  # Follow up in 6 weeks/labs-cbc/bmp/ldh- Dr.B

## 2018-05-14 NOTE — Progress Notes (Signed)
[place] NOTE  Patient Care Team: [name], MD as PCP - General (Internal Medicine)  CHIEF COMPLAINTS/PURPOSE OF CONSULTATION:  SMALL CELL LYMPHOMA  #  Oncology History   # October 2019- SMALL CELL LYMPHOMA; ki-67-10%. STAGE- III/ IV; PET scan- bulky LN above/below Diaphragm; No spleen/liver/bone;   # 22nd October 2019- Bil PE on lovenox xstopped; NOV 2019- Eliquis 5 mg BID.   # DM/HTN/ CKD- stage III -----------------------------------------------   MOLECULAR TESTING: Trisomy 12. 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 [name] 68 y.o.  male above new diagnosis of SLL/CLL on ibrutinib; incidental bilateral PE is here for follow-up.  Patient is in better spirits today.  Patient states that he has been using his bicycle.  He has been able to avoid falls.  Good appetite.  No nausea no vomiting.  Review of Systems  Constitutional: Negative for chills, diaphoresis, fever, malaise/fatigue and weight loss.  HENT: Negative for sore throat.   Eyes: Negative for double vision.  Respiratory: Negative for cough, hemoptysis, sputum production, shortness of breath and wheezing.   Cardiovascular: Negative for chest pain, palpitations, orthopnea and leg swelling.  Gastrointestinal: Negative for abdominal pain, blood in stool, constipation, diarrhea, heartburn and melena.  Genitourinary: Negative for dysuria, frequency and urgency.  Musculoskeletal: Negative for back pain and joint pain.  Skin: Negative.  Negative for itching and rash.  Neurological: Negative for  dizziness, tingling, focal weakness, weakness and headaches.  Endo/Heme/Allergies: Does not bruise/bleed easily.  Psychiatric/Behavioral: Negative for depression. The patient is not nervous/anxious and does not have insomnia.      MEDICAL HISTORY:  Past Medical History:  Diagnosis Date  . Anxiety   . Arthritis   . 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 HOH (hard of hearing)   . Hypertension   . Osteomyelitis of toe ([place]) 06/08/11   right foot  . Seasonal allergies   . Type II diabetes mellitus ([place])    diet controlled    SURGICAL HISTORY: Past Surgical History:  Procedure Laterality Date  . AMPUTATION  06/08/2011   Procedure: AMPUTATION RAY;  Surgeon: Wylene Simmer, MD;  Location: [facility];  Service: Orthopedics;  Laterality: Right;  Righ Hallux Amputation  . [place]   "crushed"  . FRACTURE SURGERY    . 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 PILONIDAL CYST / SINUS EXCISION  1970's  . TOE AMPUTATION  06/08/11   partial; right great toe    SOCIAL HISTORY: Social History   Socioeconomic History  . Marital status: Married    Spouse name: Not on file  . Number of children: Not on file  . Years of education: Not on file  . Highest education level: Not on file  Occupational History  . Not on file  Social Needs  . Financial resource strain: Not on file  . Food insecurity:    Worry: Not on file    Inability: Not on file  . Transportation needs:  Medical: Not on file    Non-medical: Not on file  Tobacco Use  . Smoking status: Former Smoker    Packs/day: 1.50    Years: 22.00    Pack years: 33.00    Types: Cigarettes    Last attempt to quit: 05/24/2011    Years since quitting: 6.9  . Smokeless tobacco: Former Systems developer    Types: [place] date: 03/07/1983  Substance and Sexual Activity  . Alcohol use: Yes    Comment: 06/08/11 "no liquor for 2 1/2 years; last beer 05/31/11"  . Drug use: No  . Sexual activity: Yes  Lifestyle  . Physical  activity:    Days per week: Not on file    Minutes per session: Not on file  . Stress: Not on file  Relationships  . Social connections:    Talks on phone: Not on file    Gets together: Not on file    Attends religious service: Not on file    Active member of club or organization: Not on file    Attends meetings of clubs or organizations: Not on file    Relationship status: Not on file  . Intimate partner violence:    Fear of current or ex partner: Not on file    Emotionally abused: Not on file    Physically abused: Not on file    Forced sexual activity: Not on file  Other Topics Concern  . Not on file  Social History Narrative   Married    FAMILY HISTORY: Family History  Problem Relation Age of Onset  . Prostate cancer Father   . Hypertension Father   . Other Mother        VARICOSE VEINS    ALLERGIES:  is allergic to penicillins.  MEDICATIONS:  Current Outpatient Medications  Medication Sig Dispense Refill  . allopurinol (ZYLOPRIM) 300 MG tablet Take 1 tablet (300 mg total) by mouth 2 (two) times daily. 120 tablet 3  . amLODipine (NORVASC) 5 MG tablet Take 1 tablet (5 mg total) by mouth daily. 30 tablet 6  . apixaban (ELIQUIS) 5 MG TABS tablet Take 1 tablet (5 mg total) by mouth 2 (two) times daily. 60 tablet 5  . ECHINACEA PO Take 760 mg by mouth daily.    . fluticasone (FLONASE) 50 MCG/ACT nasal spray Place 2 sprays into both nostrils daily as needed for allergies or rhinitis.    . Ginseng 100 MG CAPS Take 100 mg by mouth daily.    . Ibrutinib 420 MG TABS Take 420 mg by mouth daily. 28 tablet 3  . JANUVIA 100 MG tablet Take 100 mg by mouth daily.  3  . lisinopril (PRINIVIL,ZESTRIL) 40 MG tablet Take 40 mg by mouth daily.    . metFORMIN (GLUCOPHAGE) 500 MG tablet Take 1,000 mg by mouth 2 (two) times daily.    . mupirocin ointment (BACTROBAN) 2 % APPLY WITH Q TIP THREE TIMES DAILY AS NEEDED FOR DRYNESS AND CRUSTING  3  . Omega-3 Fatty Acids (OMEGA 3 500) 500 MG CAPS  Take 500 mg by mouth daily.    . ONE TOUCH ULTRA TEST test strip USE 1 STRIP TO CHECK GLUCOSE ONCE A WEEK  99  . pravastatin (PRAVACHOL) 40 MG tablet Take 40 mg by mouth daily.    . Saw Palmetto 450 MG CAPS Take 450 mg by mouth daily.     No current facility-administered medications for this visit.       Marland Kitchen  PHYSICAL EXAMINATION: ECOG PERFORMANCE STATUS: 1 - Symptomatic but completely ambulatory  Vitals:   05/14/18 1526  BP: (!) 184/89  Pulse: 86  Resp: 16  Temp: 97.6 F (36.4 C)   Filed Weights   05/14/18 1526  Weight: 216 lb (98 kg)    Physical Exam  Constitutional: He is oriented to person, place, and time and well-developed, well-nourished, and in no distress.  Accompanied by his wife.  Bilateral hard of hearing.  HENT:  Head: Normocephalic and atraumatic.  Mouth/Throat: Oropharynx is clear and moist. No oropharyngeal exudate.  Eyes: Pupils are equal, round, and reactive to light.  Neck: Normal range of motion. Neck supple.  Cardiovascular: Normal rate and regular rhythm.  Pulmonary/Chest: No respiratory distress. He has no wheezes.  Abdominal: Soft. Bowel sounds are normal. He exhibits no distension and no mass. There is no abdominal tenderness. There is no rebound and no guarding.  Musculoskeletal: Normal range of motion.        General: No tenderness or edema.  Lymphadenopathy:  Significant improvement of the lymphadenopathy in the neck/resolved; scattered approximately 1 cm few lymph nodes present in the right left underarm.  Neurological: He is alert and oriented to person, place, and time.  Skin: Skin is warm.  Psychiatric: Affect normal.     LABORATORY DATA:  I have reviewed the data as listed Lab Results  Component Value Date   WBC 10.5 05/14/2018   HGB 13.1 05/14/2018   HCT 41.3 05/14/2018   MCV 84.5 05/14/2018   PLT 260 05/14/2018   Recent Labs    12/26/17 1000  03/19/18 1414 04/16/18 1407 05/14/18 1422  NA  --    < > 138 138 136  K  --     < > 4.4 4.5 4.4  CL  --    < > 103 104 104  CO2  --    < > _0 GLUCOSE  --    < > 151* 159* 179*  BUN  --    < > 23 21 28*  CREATININE  --    < > 1.15 1.22 1.21  CALCIUM  --    < > 8.9 9.2 8.8*  GFRNONAA  --    < > >60 >60 >60  GFRAA  --    < > >60 >60 >60  PROT 6.7  --  6.6 6.8  --   ALBUMIN 4.2  --  3.7 3.8  --   AST 17  --  18 13*  --   ALT 14  --  13 12  --   ALKPHOS 63  --  49 79  --   BILITOT 0.6  --  0.5 0.5  --   BILIDIR 0.1  --   --   --   --   IBILI 0.5  --   --   --   --    < > = values in this interval not displayed.    RADIOGRAPHIC STUDIES: I have personally reviewed the radiological images as listed and agreed with the findings in the report. No results found.  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  I GVH mutated. On Ibrutinib 420 mg/day; FEB 2020- CT N/C/A/P- significant response noted.  Hemoglobin platelets normal at 13.  White count-10.   # Continue Ibrutinib for now.  Tolerating fairly well.  Stable  # HTN poorly controlled- at  home-140-150s- sec to  ibrutinib.; currently on lisinopril 40 mg a day/  norvasc 5 mg/day.  Stable  # Bilateral PE On eliquis 5 mg BID stable no excessive bleeding noted..   # CKD-stage-II/-stable  DISPOSITION:  # Follow up in 6 weeks/labs-cbc/bmp/ldh- Dr.B   All questions were answered. The patient knows to call the clinic with any problems, questions or concerns.     Cammie Sickle, MD 05/14/2018 9:10 PM

## 2018-05-16 ENCOUNTER — Other Ambulatory Visit: Payer: Self-pay | Admitting: Internal Medicine

## 2018-05-22 MED FILL — IMBRUVICA 420 MG TAB: 420 | 28 days supply | Qty: 28 | Fill #1

## 2018-06-20 ENCOUNTER — Other Ambulatory Visit: Payer: Self-pay | Admitting: Internal Medicine

## 2018-06-20 MED FILL — IMBRUVICA 420 MG TAB: 420 | 28 days supply | Qty: 28 | Fill #2

## 2018-06-25 ENCOUNTER — Other Ambulatory Visit: Payer: Medicare Other

## 2018-06-25 ENCOUNTER — Ambulatory Visit: Payer: Medicare Other | Admitting: Internal Medicine

## 2018-07-15 ENCOUNTER — Other Ambulatory Visit: Payer: Self-pay

## 2018-07-15 ENCOUNTER — Inpatient Hospital Stay: Payer: Medicare Other | Attending: Internal Medicine

## 2018-07-15 ENCOUNTER — Other Ambulatory Visit: Payer: Self-pay | Admitting: *Deleted

## 2018-07-15 DIAGNOSIS — C8308 Small cell B-cell lymphoma, lymph nodes of multiple sites: Secondary | ICD-10-CM

## 2018-07-15 LAB — CBC WITH DIFFERENTIAL/PLATELET
Abs Immature Granulocytes: 0.08 10*3/uL — ABNORMAL HIGH (ref 0.00–0.07)
Basophils Absolute: 0.1 10*3/uL (ref 0.0–0.1)
Basophils Relative: 1 %
Eosinophils Absolute: 0.2 10*3/uL (ref 0.0–0.5)
Eosinophils Relative: 2 %
HCT: 42 % (ref 39.0–52.0)
Hemoglobin: 13.2 g/dL (ref 13.0–17.0)
Immature Granulocytes: 1 %
Lymphocytes Relative: 20 %
Lymphs Abs: 2.4 10*3/uL (ref 0.7–4.0)
MCH: 26.3 pg (ref 26.0–34.0)
MCHC: 31.4 g/dL (ref 30.0–36.0)
MCV: 83.8 fL (ref 80.0–100.0)
Monocytes Absolute: 1 10*3/uL (ref 0.1–1.0)
Monocytes Relative: 8 %
Neutro Abs: 8.6 10*3/uL — ABNORMAL HIGH (ref 1.7–7.7)
Neutrophils Relative %: 68 %
Platelets: 267 10*3/uL (ref 150–400)
RBC: 5.01 MIL/uL (ref 4.22–5.81)
RDW: 15.5 % (ref 11.5–15.5)
Smear Review: NORMAL
WBC: 12.3 10*3/uL — ABNORMAL HIGH (ref 4.0–10.5)
nRBC: 0 % (ref 0.0–0.2)

## 2018-07-15 LAB — BASIC METABOLIC PANEL
Anion gap: 10 (ref 5–15)
BUN: 23 mg/dL (ref 8–23)
CO2: 25 mmol/L (ref 22–32)
Calcium: 9.2 mg/dL (ref 8.9–10.3)
Chloride: 103 mmol/L (ref 98–111)
Creatinine, Ser: 1.13 mg/dL (ref 0.61–1.24)
GFR calc Af Amer: >60 mL/min (ref >60)
GFR calc non Af Amer: >60 mL/min (ref >60)
Glucose, Bld: 175 mg/dL — ABNORMAL HIGH (ref 70–99)
Potassium: 4.2 mmol/L (ref 3.5–5.1)
Sodium: 138 mmol/L (ref 135–145)

## 2018-07-15 LAB — LACTATE DEHYDROGENASE: LDH: 163 U/L (ref 98–192)

## 2018-07-16 ENCOUNTER — Other Ambulatory Visit: Payer: Medicare Other

## 2018-07-16 ENCOUNTER — Inpatient Hospital Stay (HOSPITAL_BASED_OUTPATIENT_CLINIC_OR_DEPARTMENT_OTHER): Payer: Medicare Other | Admitting: Internal Medicine

## 2018-07-16 DIAGNOSIS — Z86711 Personal history of pulmonary embolism: Secondary | ICD-10-CM

## 2018-07-16 DIAGNOSIS — Z79899 Other long term (current) drug therapy: Secondary | ICD-10-CM

## 2018-07-16 DIAGNOSIS — Z7901 Long term (current) use of anticoagulants: Secondary | ICD-10-CM

## 2018-07-16 DIAGNOSIS — C8308 Small cell B-cell lymphoma, lymph nodes of multiple sites: Secondary | ICD-10-CM

## 2018-07-16 DIAGNOSIS — N182 Chronic kidney disease, stage 2 (mild): Secondary | ICD-10-CM

## 2018-07-16 DIAGNOSIS — I10 Essential (primary) hypertension: Secondary | ICD-10-CM | POA: Diagnosis not present

## 2018-07-16 NOTE — Progress Notes (Signed)
Patient c/o right leg muscle cramps. Patient denies any falls. He did till the garden this week and rode his bicycle this week. Patient has been using topical Bengay on the leg. Tylenol does not always relieve the muscle cramps.  Patient reports indigestion. He has been eating food with the ibrutinib, which has improved the indigestion. He denies any headaches.

## 2018-07-16 NOTE — Progress Notes (Signed)
I connected with [name] on 07/16/18 at 11:15 AM EDT by telephone visit and verified that I am speaking with the correct person using two identifiers.  I discussed the limitations, risks, security and privacy concerns of performing an evaluation and management service by telemedicine and the availability of in-person appointments. I also discussed with the patient that there may be a patient responsible charge related to this service. The patient expressed understanding and agreed to proceed.    Other persons participating in the visit and their role in the encounter: wife  Patient's location: home  Provider's location: office   Oncology History   # October 2019- SMALL CELL LYMPHOMA; ki-67-10%. STAGE- III/ IV; PET scan- bulky LN above/below Diaphragm; No spleen/liver/bone;   # 22nd October 2019- Bil PE on lovenox xstopped; NOV 2019- Eliquis 5 mg BID.   # DM/HTN/ CKD- stage III -----------------------------------------------   MOLECULAR TESTING: Trisomy 12. 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  male with history of CLL/SLL currently on ibrutinib.  Patient was to have a cramping in his right lower extremity.  No swelling.  Patient had been working with the tiller-about 10 days ago when he noticed the pain.  Slightly improved with Tylenol.  He continues to be on anticoagulation.  No new shortness of breath or cough no new lumps or bumps.  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  I GVH mutated. On Ibrutinib 420 mg/day; FEB 2020- CT N/C/A/P- significant response noted.  Hemoglobin platelets normal at 13.  White count-12.   # Continue Ibrutinib for now.  Tolerating fairly well. STABLE.  # Right LE cramp/ no swelling- ? MSK; less likely DVT.  On anticoagulation.  prn tylenol.   # HTN -at home-130-s-140s sec to  Ibrutinib; continue lisinopril 40 mg a day/  norvasc 5 mg/day.  STABLE.   # Bilateral PE On eliquis 5 mg BID-STABLE.    # CKD-stage-II/STABLE.   DISPOSITION:  # Follow up in week of June 15th-MD [needs afternnoon appt]-cbc/cmp/ldh- Dr.B     Follow-up instructions:  I discussed the assessment and treatment plan with the patient.  The patient was provided an opportunity to ask questions and all were answered.  The patient agreed with the plan and demonstrated understanding of instructions.  The patient was advised to call back or seek an in person evaluation if the symptoms worsen or if the condition fails to improve as anticipated.  I provided 12 minutes of non face-to-face telephone visit time during this encounter, and > 50% was spent counseling as documented under my assessment & plan.   Dr. Charlaine Dalton [place] at [facility] 07/16/2018 11:58 AM

## 2018-07-16 NOTE — Assessment & Plan Note (Addendum)
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 On Ibrutinib 420 mg/day; FEB 2020- CT N/C/A/P- significant response noted.  Hemoglobin platelets normal at 13.  White count-12.   # Continue Ibrutinib for now.  Tolerating fairly well. STABLE.  # Right LE cramp/ no swelling- ? MSK; less likely DVT.  On anticoagulation.  prn tylenol.   # HTN -at home-130-s-140s sec to  Ibrutinib; continue lisinopril 40 mg a day/  norvasc 5 mg/day.  STABLE.   # Bilateral PE On eliquis 5 mg BID-STABLE.    # CKD-stage-II/STABLE.   DISPOSITION:  # Follow up in week of June 15th-MD [needs afternnoon appt]-cbc/cmp/ldh- Dr.B

## 2018-07-17 ENCOUNTER — Ambulatory Visit: Payer: Medicare Other | Admitting: Internal Medicine

## 2018-07-17 MED FILL — IMBRUVICA 420 MG TAB: 420 | 28 days supply | Qty: 28 | Fill #3

## 2018-08-08 ENCOUNTER — Other Ambulatory Visit: Payer: Self-pay | Admitting: Internal Medicine

## 2018-08-08 DIAGNOSIS — C8308 Small cell B-cell lymphoma, lymph nodes of multiple sites: Secondary | ICD-10-CM

## 2018-08-14 MED FILL — IMBRUVICA 420 MG TAB: 420 | 28 days supply | Qty: 28 | Fill #0

## 2018-08-20 ENCOUNTER — Other Ambulatory Visit: Payer: Self-pay

## 2018-08-20 ENCOUNTER — Other Ambulatory Visit: Payer: Self-pay | Admitting: *Deleted

## 2018-08-20 ENCOUNTER — Inpatient Hospital Stay: Payer: Medicare Other | Attending: Internal Medicine

## 2018-08-20 ENCOUNTER — Inpatient Hospital Stay (HOSPITAL_BASED_OUTPATIENT_CLINIC_OR_DEPARTMENT_OTHER): Payer: Medicare Other | Admitting: Internal Medicine

## 2018-08-20 DIAGNOSIS — C8308 Small cell B-cell lymphoma, lymph nodes of multiple sites: Secondary | ICD-10-CM

## 2018-08-20 DIAGNOSIS — M2550 Pain in unspecified joint: Secondary | ICD-10-CM

## 2018-08-20 DIAGNOSIS — I129 Hypertensive chronic kidney disease with stage 1 through stage 4 chronic kidney disease, or unspecified chronic kidney disease: Secondary | ICD-10-CM | POA: Diagnosis not present

## 2018-08-20 DIAGNOSIS — Z7901 Long term (current) use of anticoagulants: Secondary | ICD-10-CM | POA: Insufficient documentation

## 2018-08-20 DIAGNOSIS — Z87891 Personal history of nicotine dependence: Secondary | ICD-10-CM

## 2018-08-20 DIAGNOSIS — N182 Chronic kidney disease, stage 2 (mild): Secondary | ICD-10-CM | POA: Diagnosis not present

## 2018-08-20 DIAGNOSIS — E1122 Type 2 diabetes mellitus with diabetic chronic kidney disease: Secondary | ICD-10-CM | POA: Insufficient documentation

## 2018-08-20 DIAGNOSIS — R5383 Other fatigue: Secondary | ICD-10-CM

## 2018-08-20 DIAGNOSIS — I2699 Other pulmonary embolism without acute cor pulmonale: Secondary | ICD-10-CM | POA: Diagnosis not present

## 2018-08-20 DIAGNOSIS — G8929 Other chronic pain: Secondary | ICD-10-CM

## 2018-08-20 LAB — CBC WITH DIFFERENTIAL/PLATELET
Abs Immature Granulocytes: 0.16 10*3/uL — ABNORMAL HIGH (ref 0.00–0.07)
Basophils Absolute: 0.1 10*3/uL (ref 0.0–0.1)
Basophils Relative: 1 %
Eosinophils Absolute: 0.1 10*3/uL (ref 0.0–0.5)
Eosinophils Relative: 1 %
HCT: 38.7 % — ABNORMAL LOW (ref 39.0–52.0)
Hemoglobin: 11.9 g/dL — ABNORMAL LOW (ref 13.0–17.0)
Immature Granulocytes: 1 %
Lymphocytes Relative: 22 %
Lymphs Abs: 2.6 10*3/uL (ref 0.7–4.0)
MCH: 25.6 pg — ABNORMAL LOW (ref 26.0–34.0)
MCHC: 30.7 g/dL (ref 30.0–36.0)
MCV: 83.4 fL (ref 80.0–100.0)
Monocytes Absolute: 0.9 10*3/uL (ref 0.1–1.0)
Monocytes Relative: 8 %
Neutro Abs: 8 10*3/uL — ABNORMAL HIGH (ref 1.7–7.7)
Neutrophils Relative %: 67 %
Platelets: 367 10*3/uL (ref 150–400)
RBC: 4.64 MIL/uL (ref 4.22–5.81)
RDW: 15.1 % (ref 11.5–15.5)
WBC: 11.8 10*3/uL — ABNORMAL HIGH (ref 4.0–10.5)
nRBC: 0 % (ref 0.0–0.2)

## 2018-08-20 LAB — COMPREHENSIVE METABOLIC PANEL
ALT: 12 U/L (ref 0–44)
AST: 11 U/L — ABNORMAL LOW (ref 15–41)
Albumin: 3.6 g/dL (ref 3.5–5.0)
Alkaline Phosphatase: 64 U/L (ref 38–126)
Anion gap: 12 (ref 5–15)
BUN: 29 mg/dL — ABNORMAL HIGH (ref 8–23)
CO2: 23 mmol/L (ref 22–32)
Calcium: 9.3 mg/dL (ref 8.9–10.3)
Chloride: 103 mmol/L (ref 98–111)
Creatinine, Ser: 1.31 mg/dL — ABNORMAL HIGH (ref 0.61–1.24)
GFR calc Af Amer: >60 mL/min (ref >60)
GFR calc non Af Amer: 56 mL/min — ABNORMAL LOW (ref >60)
Glucose, Bld: 163 mg/dL — ABNORMAL HIGH (ref 70–99)
Potassium: 4.4 mmol/L (ref 3.5–5.1)
Sodium: 138 mmol/L (ref 135–145)
Total Bilirubin: 0.3 mg/dL (ref 0.3–1.2)
Total Protein: 7.1 g/dL (ref 6.5–8.1)

## 2018-08-20 LAB — LACTATE DEHYDROGENASE: LDH: 141 U/L (ref 98–192)

## 2018-08-20 NOTE — Assessment & Plan Note (Addendum)
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 On Ibrutinib 420 mg/day; FEB 2020- CT N/C/A/P- significant response noted.  Hemoglobin platelets normal at 11.  White count-12.   # Continue Ibrutinib for now.  Tolerating fairly well. Stable.  We will plan to get imaging in August 2020.  # HTN -at home-130-s-140s sec to  Ibrutinib; continue lisinopril 40 mg a day/  norvasc 5 mg/day.  Stable.   # Bilateral PE On eliquis 5 mg BID- stable.   # CKD-stage-II/ stable.   DISPOSITION:  # Follow up in third week of July 2020; MD-lab- cbc/cmp/ldh- Dr.B

## 2018-08-20 NOTE — Progress Notes (Signed)
[facility] NOTE  Patient Care Team: [name], MD as PCP - General (Internal Medicine)  CHIEF COMPLAINTS/PURPOSE OF CONSULTATION:  SMALL CELL LYMPHOMA  #  Oncology History Overview Note  # October 2019- SMALL CELL LYMPHOMA; ki-67-10%. STAGE- III/ IV; PET scan- bulky LN above/below Diaphragm; No spleen/liver/bone;   # 22nd October 2019- Bil PE on lovenox xstopped; NOV 2019- Eliquis 5 mg BID.   # DM/HTN/ CKD- stage III -----------------------------------------------   MOLECULAR TESTING: Trisomy 12. 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 [name] 68 y.o.  male above diagnosis of of SLL/CLL on ibrutinib; incidental bilateral PE is here for follow-up.  Patient continues her chronic intermittent joint pains.  Chronic mild fatigue.  He states his blood pressures are better controlled at home.  Good appetite.  No nausea no vomiting.  Review of Systems  Constitutional: Positive for malaise/fatigue. Negative for chills, diaphoresis, fever and weight loss.  HENT: Negative for sore throat.   Eyes: Negative for double vision.  Respiratory: Negative for cough, hemoptysis, sputum production, shortness of breath and wheezing.   Cardiovascular: Negative for chest pain, palpitations, orthopnea and leg swelling.  Gastrointestinal: Negative for abdominal pain, blood in stool, constipation, diarrhea, heartburn and melena.  Genitourinary: Negative for dysuria, frequency and urgency.  Musculoskeletal: Positive for back pain and joint pain.  Skin: Negative.  Negative for itching  and rash.  Neurological: Negative for dizziness, tingling, focal weakness, weakness and headaches.  Endo/Heme/Allergies: Does not bruise/bleed easily.  Psychiatric/Behavioral: Negative for depression. The patient is not nervous/anxious and does not have insomnia.      MEDICAL HISTORY:  Past Medical History:  Diagnosis Date  . Anxiety   . Arthritis   . 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 HOH (hard of hearing)   . Hypertension   . Osteomyelitis of toe ([place]) 06/08/11   right foot  . Seasonal allergies   . Type II diabetes mellitus ([place])    diet controlled    SURGICAL HISTORY: Past Surgical History:  Procedure Laterality Date  . AMPUTATION  06/08/2011   Procedure: AMPUTATION RAY;  Surgeon: Wylene Simmer, MD;  Location: [facility];  Service: Orthopedics;  Laterality: Right;  Righ Hallux Amputation  . [place]   "crushed"  . FRACTURE SURGERY    . 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 PILONIDAL CYST / SINUS EXCISION  1970's  . TOE AMPUTATION  06/08/11   partial; right great toe    SOCIAL HISTORY: Social History   Socioeconomic History  . Marital status: Married    Spouse name: Not on file  . Number of children: Not on file  . Years of education: Not on file  . Highest education level: Not on file  Occupational History  . Not on file  Social Needs  . Financial resource strain: Not on file  . Food insecurity    Worry: Not on file    Inability: Not on file  . Transportation needs    Medical:  Not on file    Non-medical: Not on file  Tobacco Use  . Smoking status: Former Smoker    Packs/day: 1.50    Years: 22.00    Pack years: 33.00    Types: Cigarettes    Quit date: 05/24/2011    Years since quitting: 7.2  . Smokeless tobacco: Former Systems developer    Types: [place] date: 03/07/1983  Substance and Sexual Activity  . Alcohol use: Yes    Comment: 06/08/11 "no liquor for 2 1/2 years; last beer 05/31/11"  . Drug use: No  . Sexual activity: Yes   Lifestyle  . Physical activity    Days per week: Not on file    Minutes per session: Not on file  . Stress: Not on file  Relationships  . Social Herbalist on phone: Not on file    Gets together: Not on file    Attends religious service: Not on file    Active member of club or organization: Not on file    Attends meetings of clubs or organizations: Not on file    Relationship status: Not on file  . Intimate partner violence    Fear of current or ex partner: Not on file    Emotionally abused: Not on file    Physically abused: Not on file    Forced sexual activity: Not on file  Other Topics Concern  . Not on file  Social History Narrative   Married    FAMILY HISTORY: Family History  Problem Relation Age of Onset  . Prostate cancer Father   . Hypertension Father   . Other Mother        VARICOSE VEINS    ALLERGIES:  is allergic to penicillins.  MEDICATIONS:  Current Outpatient Medications  Medication Sig Dispense Refill  . allopurinol (ZYLOPRIM) 300 MG tablet Take 1 tablet by mouth twice daily 180 tablet 0  . amLODipine (NORVASC) 5 MG tablet Take 1 tablet (5 mg total) by mouth daily. 30 tablet 6  . ECHINACEA PO Take 760 mg by mouth daily.    Marland Kitchen ELIQUIS 5 MG TABS tablet TAKE 1 TABLET BY MOUTH TWICE DAILY 180 tablet 0  . fluticasone (FLONASE) 50 MCG/ACT nasal spray Place 2 sprays into both nostrils daily as needed for allergies or rhinitis.    . Ginseng 100 MG CAPS Take 100 mg by mouth daily.    . IMBRUVICA 420 MG TABS TAKE 1 TABLET (420 MG) BY MOUTH DAILY. 28 tablet 3  . JANUVIA 100 MG tablet Take 100 mg by mouth daily.  3  . lisinopril (PRINIVIL,ZESTRIL) 40 MG tablet Take 40 mg by mouth daily.    . metFORMIN (GLUCOPHAGE) 500 MG tablet Take 1,000 mg by mouth 2 (two) times daily.    . mupirocin ointment (BACTROBAN) 2 % APPLY WITH Q TIP THREE TIMES DAILY AS NEEDED FOR DRYNESS AND CRUSTING  3  . Omega-3 Fatty Acids (OMEGA 3 500) 500 MG CAPS Take 500 mg by mouth  daily.    . ONE TOUCH ULTRA TEST test strip USE 1 STRIP TO CHECK GLUCOSE ONCE A WEEK  99  . pravastatin (PRAVACHOL) 40 MG tablet Take 40 mg by mouth daily.    . Saw Palmetto 450 MG CAPS Take 450 mg by mouth daily.     No current facility-administered medications for this visit.       Marland Kitchen  PHYSICAL EXAMINATION: ECOG PERFORMANCE STATUS: 1 - Symptomatic but completely ambulatory  Vitals:   08/20/18 1459  BP: (!) 185/81  Pulse: 93  Resp: 18  Temp: 98.6 F (37 C)   Filed Weights   08/20/18 1459  Weight: 210 lb 9.6 oz (95.5 kg)    Physical Exam  Constitutional: He is oriented to person, place, and time and well-developed, well-nourished, and in no distress.  Alone.  HENT:  Head: Normocephalic and atraumatic.  Mouth/Throat: Oropharynx is clear and moist. No oropharyngeal exudate.  Eyes: Pupils are equal, round, and reactive to light.  Neck: Normal range of motion. Neck supple.  Cardiovascular: Normal rate and regular rhythm.  Pulmonary/Chest: No respiratory distress. He has no wheezes.  Abdominal: Soft. Bowel sounds are normal. He exhibits no distension and no mass. There is no abdominal tenderness. There is no rebound and no guarding.  Musculoskeletal: Normal range of motion.        General: No tenderness or edema.  Lymphadenopathy:  Significant improvement of the lymphadenopathy in the neck/resolved; scattered approximately 1 cm few lymph nodes present in the right left underarm.  Neurological: He is alert and oriented to person, place, and time.  Skin: Skin is warm.  Psychiatric: Affect normal.     LABORATORY DATA:  I have reviewed the data as listed Lab Results  Component Value Date   WBC 11.8 (H) 08/20/2018   HGB 11.9 (L) 08/20/2018   HCT 38.7 (L) 08/20/2018   MCV 83.4 08/20/2018   PLT 367 08/20/2018   Recent Labs    12/26/17 1000  03/19/18 1414 04/16/18 1407 05/14/18 1422 07/15/18 1411 08/20/18 1415  NA  --    < > 138 138 136 138 138  K  --    < > 4.4  4.5 4.4 4.2 4.4  CL  --    < > 103 104 104 103 103  CO2  --    < > _0 GLUCOSE  --    < > 151* 159* 179* 175* 163*  BUN  --    < > 23 21 28* 23 29*  CREATININE  --    < > 1.15 1.22 1.21 1.13 1.31*  CALCIUM  --    < > 8.9 9.2 8.8* 9.2 9.3  GFRNONAA  --    < > >60 >60 >60 >60 56*  GFRAA  --    < > >60 >60 >60 >60 >60  PROT 6.7  --  6.6 6.8  --   --  7.1  ALBUMIN 4.2  --  3.7 3.8  --   --  3.6  AST 17  --  18 13*  --   --  11*  ALT 14  --  13 12  --   --  12  ALKPHOS 63  --  49 79  --   --  64  BILITOT 0.6  --  0.5 0.5  --   --  0.3  BILIDIR 0.1  --   --   --   --   --   --   IBILI 0.5  --   --   --   --   --   --    < > = values in this interval not displayed.    RADIOGRAPHIC STUDIES: I have personally reviewed the radiological images as listed and agreed with the findings in the report. No results found.  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  I GVH mutated. On Ibrutinib 420 mg/day; FEB 2020- CT N/C/A/P- significant response noted.  Hemoglobin platelets normal at 11.  White count-12.   # Continue Ibrutinib for now.  Tolerating fairly well. Stable.  We will plan to get imaging in August 2020.  # HTN -at home-130-s-140s sec to  Ibrutinib; continue lisinopril 40 mg a day/  norvasc 5 mg/day.  Stable.   # Bilateral PE On eliquis 5 mg BID- stable.   # CKD-stage-II/ stable.   DISPOSITION:  # Follow up in third week of July 2020; MD-lab- cbc/cmp/ldh- Dr.B  All questions were answered. The patient knows to call the clinic with any problems, questions or concerns.     Cammie Sickle, MD 08/20/2018 3:48 PM

## 2018-08-20 NOTE — Progress Notes (Signed)
Patient does not offer any problems today.  

## 2018-08-30 ENCOUNTER — Telehealth: Payer: Self-pay | Admitting: *Deleted

## 2018-08-30 DIAGNOSIS — R110 Nausea: Secondary | ICD-10-CM

## 2018-08-30 MED ORDER — ONDANSETRON HCL 4 MG PO TABS: 4.0000 mg | ORAL_TABLET | Freq: Three times a day (TID) | ORAL | 3 refills | Status: DC | PRN

## 2018-08-30 NOTE — Telephone Encounter (Signed)
Wife called reporting that the [name] is making the patient nauseated. She is asking for medicine for it and also what he can drink since he is diabetic. Please advise

## 2018-08-30 NOTE — Telephone Encounter (Signed)
Patient may take zofran 4 mg 1 tablet every 8 hours.  Script sent to the pt's pharmacy.  Wife made aware of the plan of care. Encouraged patient to continue to stay hydrated.

## 2018-09-11 MED FILL — IMBRUVICA 420 MG TAB: 420 | 28 days supply | Qty: 28 | Fill #1

## 2018-09-19 ENCOUNTER — Other Ambulatory Visit: Payer: Self-pay | Admitting: Internal Medicine

## 2018-09-24 ENCOUNTER — Other Ambulatory Visit: Payer: Self-pay

## 2018-09-24 ENCOUNTER — Inpatient Hospital Stay (HOSPITAL_BASED_OUTPATIENT_CLINIC_OR_DEPARTMENT_OTHER): Payer: Medicare Other | Admitting: Internal Medicine

## 2018-09-24 ENCOUNTER — Encounter: Payer: Self-pay | Admitting: Internal Medicine

## 2018-09-24 ENCOUNTER — Inpatient Hospital Stay: Payer: Medicare Other | Attending: Internal Medicine

## 2018-09-24 DIAGNOSIS — M2550 Pain in unspecified joint: Secondary | ICD-10-CM

## 2018-09-24 DIAGNOSIS — Z87891 Personal history of nicotine dependence: Secondary | ICD-10-CM | POA: Insufficient documentation

## 2018-09-24 DIAGNOSIS — Z7984 Long term (current) use of oral hypoglycemic drugs: Secondary | ICD-10-CM | POA: Diagnosis not present

## 2018-09-24 DIAGNOSIS — D649 Anemia, unspecified: Secondary | ICD-10-CM | POA: Insufficient documentation

## 2018-09-24 DIAGNOSIS — N182 Chronic kidney disease, stage 2 (mild): Secondary | ICD-10-CM

## 2018-09-24 DIAGNOSIS — R5383 Other fatigue: Secondary | ICD-10-CM

## 2018-09-24 DIAGNOSIS — Z7901 Long term (current) use of anticoagulants: Secondary | ICD-10-CM

## 2018-09-24 DIAGNOSIS — I129 Hypertensive chronic kidney disease with stage 1 through stage 4 chronic kidney disease, or unspecified chronic kidney disease: Secondary | ICD-10-CM | POA: Insufficient documentation

## 2018-09-24 DIAGNOSIS — C8308 Small cell B-cell lymphoma, lymph nodes of multiple sites: Secondary | ICD-10-CM | POA: Diagnosis not present

## 2018-09-24 DIAGNOSIS — E1122 Type 2 diabetes mellitus with diabetic chronic kidney disease: Secondary | ICD-10-CM | POA: Insufficient documentation

## 2018-09-24 DIAGNOSIS — G8929 Other chronic pain: Secondary | ICD-10-CM

## 2018-09-24 DIAGNOSIS — M545 Low back pain: Secondary | ICD-10-CM | POA: Insufficient documentation

## 2018-09-24 DIAGNOSIS — I2699 Other pulmonary embolism without acute cor pulmonale: Secondary | ICD-10-CM

## 2018-09-24 LAB — COMPREHENSIVE METABOLIC PANEL
ALT: 13 U/L (ref 0–44)
AST: 14 U/L — ABNORMAL LOW (ref 15–41)
Albumin: 3.1 g/dL — ABNORMAL LOW (ref 3.5–5.0)
Alkaline Phosphatase: 46 U/L (ref 38–126)
Anion gap: 9 (ref 5–15)
BUN: 25 mg/dL — ABNORMAL HIGH (ref 8–23)
CO2: 25 mmol/L (ref 22–32)
Calcium: 8.6 mg/dL — ABNORMAL LOW (ref 8.9–10.3)
Chloride: 102 mmol/L (ref 98–111)
Creatinine, Ser: 1.35 mg/dL — ABNORMAL HIGH (ref 0.61–1.24)
GFR calc Af Amer: >60 mL/min (ref >60)
GFR calc non Af Amer: 54 mL/min — ABNORMAL LOW (ref >60)
Glucose, Bld: 146 mg/dL — ABNORMAL HIGH (ref 70–99)
Potassium: 4.1 mmol/L (ref 3.5–5.1)
Sodium: 136 mmol/L (ref 135–145)
Total Bilirubin: 0.5 mg/dL (ref 0.3–1.2)
Total Protein: 6.4 g/dL — ABNORMAL LOW (ref 6.5–8.1)

## 2018-09-24 LAB — CBC WITH DIFFERENTIAL/PLATELET
Abs Immature Granulocytes: 0.05 10*3/uL (ref 0.00–0.07)
Basophils Absolute: 0.1 10*3/uL (ref 0.0–0.1)
Basophils Relative: 1 %
Eosinophils Absolute: 0 10*3/uL (ref 0.0–0.5)
Eosinophils Relative: 0 %
HCT: 32.9 % — ABNORMAL LOW (ref 39.0–52.0)
Hemoglobin: 10.5 g/dL — ABNORMAL LOW (ref 13.0–17.0)
Immature Granulocytes: 1 %
Lymphocytes Relative: 18 %
Lymphs Abs: 1.5 10*3/uL (ref 0.7–4.0)
MCH: 26.1 pg (ref 26.0–34.0)
MCHC: 31.9 g/dL (ref 30.0–36.0)
MCV: 81.8 fL (ref 80.0–100.0)
Monocytes Absolute: 1.2 10*3/uL — ABNORMAL HIGH (ref 0.1–1.0)
Monocytes Relative: 14 %
Neutro Abs: 5.6 10*3/uL (ref 1.7–7.7)
Neutrophils Relative %: 66 %
Platelets: 203 10*3/uL (ref 150–400)
RBC: 4.02 MIL/uL — ABNORMAL LOW (ref 4.22–5.81)
RDW: 15.7 % — ABNORMAL HIGH (ref 11.5–15.5)
WBC: 8.4 10*3/uL (ref 4.0–10.5)
nRBC: 0 % (ref 0.0–0.2)

## 2018-09-24 LAB — LACTATE DEHYDROGENASE: LDH: 143 U/L (ref 98–192)

## 2018-09-24 LAB — IRON AND TIBC
Iron: 13 ug/dL — ABNORMAL LOW (ref 45–182)
Saturation Ratios: 5 % — ABNORMAL LOW (ref 17.9–39.5)
TIBC: 281 ug/dL (ref 250–450)
UIBC: 268 ug/dL

## 2018-09-24 LAB — FERRITIN: Ferritin: 72 ng/mL (ref 24–336)

## 2018-09-24 NOTE — Assessment & Plan Note (Addendum)
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 On Ibrutinib 420 mg/day; FEB 2020- CT N/C/A/P- significant response noted.  Hemoglobin platelets normal at 10.5. White count-8.4; platelets 203.   # Continue Ibrutinib for now.  Tolerating fairly well. Stable.  Order imaging today.  # Mild anemia- ? CKD; check Iron studies/ferritin today; start Vitron C a day.   # HTN -at home-130-s-140s sec to  Ibrutinib; continue lisinopril 40 mg a day/  norvasc 5 mg/day.  Stable.  # Bilateral PE On eliquis 5 mg BID- stable.   # CKD-stage-II/stable  #Updated the patient's wife over the phone.  DISPOSITION: Add iron studies/ ferritin # follow up in 1 month- MD- labs- cbc/cmp/ldh; CT chest and pelvis noncontrast prior- Dr.B

## 2018-09-24 NOTE — Patient Instructions (Signed)
#  Vitron-C [iron tablet; over-the-counter] once a day

## 2018-09-24 NOTE — Progress Notes (Signed)
[facility] NOTE  Patient Care Team: [name], MD as PCP - General (Internal Medicine)  CHIEF COMPLAINTS/PURPOSE OF CONSULTATION: SMALL CELL LYMPHOMA  #  Oncology History Overview Note  # October 2019- SMALL CELL LYMPHOMA; ki-67-10%. STAGE- III/ IV; PET scan- bulky LN above/below Diaphragm; No spleen/liver/bone;   # 22nd October 2019- Bil PE on lovenox xstopped; NOV 2019- Eliquis 5 mg BID.   # DM/HTN/ CKD- stage III -----------------------------------------------   MOLECULAR TESTING: Trisomy 12. 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 [name] 68 y.o.  male above diagnosis of of SLL/CLL on ibrutinib; incidental bilateral PE is here for follow-up.  Patient overall doing well.  Continues to come to mild fatigue.  Denies any nausea vomiting.  Complains of chronic joint pains back pain not any worse.   Review of Systems  Constitutional: Positive for malaise/fatigue. Negative for chills, diaphoresis, fever and weight loss.  HENT: Negative for sore throat.   Eyes: Negative for double vision.  Respiratory: Negative for cough, hemoptysis, sputum production, shortness of breath and wheezing.   Cardiovascular: Negative for chest pain, palpitations, orthopnea and leg swelling.  Gastrointestinal: Negative for abdominal pain, blood in stool, constipation, diarrhea, heartburn and melena.  Genitourinary: Negative for dysuria, frequency and urgency.  Musculoskeletal: Positive for back pain and joint pain.  Skin: Negative.  Negative for itching and rash.  Neurological:  Negative for dizziness, tingling, focal weakness, weakness and headaches.  Endo/Heme/Allergies: Does not bruise/bleed easily.  Psychiatric/Behavioral: Negative for depression. The patient is not nervous/anxious and does not have insomnia.      MEDICAL HISTORY:  Past Medical History:  Diagnosis Date  . Anxiety   . Arthritis   . 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 HOH (hard of hearing)   . Hypertension   . 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 Seasonal allergies   . Type II diabetes mellitus ([place])    diet controlled    SURGICAL HISTORY: Past Surgical History:  Procedure Laterality Date  . AMPUTATION  06/08/2011   Procedure: AMPUTATION RAY;  Surgeon: Wylene Simmer, MD;  Location: [facility];  Service: Orthopedics;  Laterality: Right;  [place] Amputation  . [place]   "crushed"  . FRACTURE SURGERY    . 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 PILONIDAL CYST / SINUS EXCISION  1970's  . TOE AMPUTATION  06/08/11   partial; right great toe    SOCIAL HISTORY: Social History   Socioeconomic History  . Marital status: Married    Spouse name: Not on file  . Number of children: Not on file  . Years of education: Not on file  . Highest education level: Not on file  Occupational History  . Not on file  Social Needs  . Financial resource strain: Not on file  . Food insecurity    Worry: Not on file    Inability: Not on file  . Transportation needs    Medical: Not on file  Non-medical: Not on file  Tobacco Use  . Smoking status: Former Smoker    Packs/day: 1.50    Years: 22.00    Pack years: 33.00    Types: Cigarettes    Quit date: 05/24/2011    Years since quitting: 7.3  . Smokeless tobacco: Former Systems developer    Types: [place] date: 03/07/1983  Substance and Sexual Activity  . Alcohol use: Yes    Comment: 06/08/11 "no liquor for 2 1/2 years; last beer 05/31/11"  . Drug use: No  . Sexual activity: Yes  Lifestyle  . Physical activity     Days per week: Not on file    Minutes per session: Not on file  . Stress: Not on file  Relationships  . Social Herbalist on phone: Not on file    Gets together: Not on file    Attends religious service: Not on file    Active member of club or organization: Not on file    Attends meetings of clubs or organizations: Not on file    Relationship status: Not on file  . Intimate partner violence    Fear of current or ex partner: Not on file    Emotionally abused: Not on file    Physically abused: Not on file    Forced sexual activity: Not on file  Other Topics Concern  . Not on file  Social History Narrative   Married    FAMILY HISTORY: Family History  Problem Relation Age of Onset  . Prostate cancer Father   . Hypertension Father   . Other Mother        VARICOSE VEINS    ALLERGIES:  is allergic to penicillins.  MEDICATIONS:  Current Outpatient Medications  Medication Sig Dispense Refill  . allopurinol (ZYLOPRIM) 300 MG tablet Take 1 tablet by mouth twice daily 180 tablet 0  . amLODipine (NORVASC) 5 MG tablet Take 1 tablet (5 mg total) by mouth daily. 30 tablet 6  . ECHINACEA PO Take 760 mg by mouth daily.    Marland Kitchen ELIQUIS 5 MG TABS tablet TAKE 1 TABLET BY MOUTH TWICE DAILY 180 tablet 0  . fluticasone (FLONASE) 50 MCG/ACT nasal spray Place 2 sprays into both nostrils daily as needed for allergies or rhinitis.    . Ginseng 100 MG CAPS Take 100 mg by mouth daily.    . IMBRUVICA 420 MG TABS TAKE 1 TABLET (420 MG) BY MOUTH DAILY. 28 tablet 3  . JANUVIA 100 MG tablet Take 100 mg by mouth daily.  3  . lisinopril (PRINIVIL,ZESTRIL) 40 MG tablet Take 40 mg by mouth daily.    . metFORMIN (GLUCOPHAGE) 500 MG tablet Take 1,000 mg by mouth 2 (two) times daily.    . mupirocin ointment (BACTROBAN) 2 % APPLY WITH Q TIP THREE TIMES DAILY AS NEEDED FOR DRYNESS AND CRUSTING  3  . Omega-3 Fatty Acids (OMEGA 3 500) 500 MG CAPS Take 500 mg by mouth daily.    . ondansetron (ZOFRAN) 4  MG tablet Take 1 tablet (4 mg total) by mouth every 8 (eight) hours as needed for nausea or vomiting. 20 tablet 3  . ONE TOUCH ULTRA TEST test strip USE 1 STRIP TO CHECK GLUCOSE ONCE A WEEK  99  . pravastatin (PRAVACHOL) 40 MG tablet Take 40 mg by mouth daily.    . Saw Palmetto 450 MG CAPS Take 450 mg by mouth daily.     No current facility-administered medications for  this visit.       Marland Kitchen  PHYSICAL EXAMINATION: ECOG PERFORMANCE STATUS: 1 - Symptomatic but completely ambulatory  Vitals:   09/24/18 1404 09/24/18 1408  BP: (!) 157/77 (!) 169/74  Pulse: 91 90  Resp: 20   Temp: 99.5 F (37.5 C)    Filed Weights   09/24/18 1404  Weight: 204 lb (92.5 kg)    Physical Exam  Constitutional: He is oriented to person, place, and time and well-developed, well-nourished, and in no distress.  Alone.  HENT:  Head: Normocephalic and atraumatic.  Mouth/Throat: Oropharynx is clear and moist. No oropharyngeal exudate.  Eyes: Pupils are equal, round, and reactive to light.  Neck: Normal range of motion. Neck supple.  Cardiovascular: Normal rate and regular rhythm.  Pulmonary/Chest: No respiratory distress. He has no wheezes.  Abdominal: Soft. Bowel sounds are normal. He exhibits no distension and no mass. There is no abdominal tenderness. There is no rebound and no guarding.  Musculoskeletal: Normal range of motion.        General: No tenderness or edema.  Lymphadenopathy:  Significant improvement of the lymphadenopathy in the neck/resolved; scattered approximately 1 cm few lymph nodes present in the right left underarm.  Neurological: He is alert and oriented to person, place, and time.  Skin: Skin is warm.  Psychiatric: Affect normal.     LABORATORY DATA:  I have reviewed the data as listed Lab Results  Component Value Date   WBC 8.4 09/24/2018   HGB 10.5 (L) 09/24/2018   HCT 32.9 (L) 09/24/2018   MCV 81.8 09/24/2018   PLT 203 09/24/2018   Recent Labs    12/26/17 1000   04/16/18 1407  07/15/18 1411 08/20/18 1415 09/24/18 1335  NA  --    < > 138   < > 138 138 136  K  --    < > 4.5   < > 4.2 4.4 4.1  CL  --    < > 104   < > 103 103 102  CO2  --    < > 28   < > '25 23 25  ' GLUCOSE  --    < > 159*   < > 175* 163* 146*  BUN  --    < > 21   < > 23 29* 25*  CREATININE  --    < > 1.22   < > 1.13 1.31* 1.35*  CALCIUM  --    < > 9.2   < > 9.2 9.3 8.6*  GFRNONAA  --    < > >60   < > >60 56* 54*  GFRAA  --    < > >60   < > >60 >60 >60  PROT 6.7   < > 6.8  --   --  7.1 6.4*  ALBUMIN 4.2   < > 3.8  --   --  3.6 3.1*  AST 17   < > 13*  --   --  11* 14*  ALT 14   < > 12  --   --  12 13  ALKPHOS 63   < > 79  --   --  64 46  BILITOT 0.6   < > 0.5  --   --  0.3 0.5  BILIDIR 0.1  --   --   --   --   --   --   IBILI 0.5  --   --   --   --   --   --    < > =  values in this interval not displayed.    RADIOGRAPHIC STUDIES: I have personally reviewed the radiological images as listed and agreed with the findings in the report. No results found.  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 On Ibrutinib 420 mg/day; FEB 2020- CT N/C/A/P- significant response noted.  Hemoglobin platelets normal at 10.5. White count-8.4; platelets 203.   # Continue Ibrutinib for now.  Tolerating fairly well. Stable.  Order imaging today.  # Mild anemia- ? CKD; check Iron studies/ferritin today; start Vitron C a day.   # HTN -at home-130-s-140s sec to  Ibrutinib; continue lisinopril 40 mg a day/  norvasc 5 mg/day.  Stable.  # Bilateral PE On eliquis 5 mg BID- stable.   # CKD-stage-II/stable  DISPOSITION: Add iron studies/ ferritin # follow up in 1 month- MD- labs- cbc/cmp/ldh; CT chest and pelvis noncontrast prior- Dr.B  All questions were answered. The patient knows to call the clinic with any problems, questions or concerns.     Cammie Sickle, MD 09/26/2018 4:09  PM

## 2018-10-09 MED FILL — IMBRUVICA 420 MG TAB: 420 | 28 days supply | Qty: 28 | Fill #2

## 2018-10-12 ENCOUNTER — Other Ambulatory Visit: Payer: Self-pay | Admitting: Internal Medicine

## 2018-10-17 ENCOUNTER — Other Ambulatory Visit: Payer: Self-pay

## 2018-10-17 ENCOUNTER — Ambulatory Visit
Admission: RE | Admit: 2018-10-17 | Discharge: 2018-10-17 | Disposition: A | Discharge status: Home / Self Care | Payer: Medicare Other | Source: Ambulatory Visit | Attending: Internal Medicine | Admitting: Internal Medicine

## 2018-10-17 DIAGNOSIS — C8308 Small cell B-cell lymphoma, lymph nodes of multiple sites: Secondary | ICD-10-CM | POA: Diagnosis not present

## 2018-10-21 ENCOUNTER — Other Ambulatory Visit: Payer: Self-pay

## 2018-10-22 ENCOUNTER — Other Ambulatory Visit: Payer: Self-pay

## 2018-10-22 ENCOUNTER — Inpatient Hospital Stay (HOSPITAL_BASED_OUTPATIENT_CLINIC_OR_DEPARTMENT_OTHER): Payer: Medicare Other | Admitting: Internal Medicine

## 2018-10-22 ENCOUNTER — Inpatient Hospital Stay: Payer: Medicare Other | Attending: Internal Medicine

## 2018-10-22 ENCOUNTER — Encounter: Payer: Self-pay | Admitting: Internal Medicine

## 2018-10-22 DIAGNOSIS — C8308 Small cell B-cell lymphoma, lymph nodes of multiple sites: Secondary | ICD-10-CM

## 2018-10-22 DIAGNOSIS — Z87891 Personal history of nicotine dependence: Secondary | ICD-10-CM | POA: Diagnosis not present

## 2018-10-22 DIAGNOSIS — I2699 Other pulmonary embolism without acute cor pulmonale: Secondary | ICD-10-CM | POA: Diagnosis not present

## 2018-10-22 DIAGNOSIS — G8929 Other chronic pain: Secondary | ICD-10-CM | POA: Diagnosis not present

## 2018-10-22 DIAGNOSIS — M2550 Pain in unspecified joint: Secondary | ICD-10-CM | POA: Insufficient documentation

## 2018-10-22 DIAGNOSIS — Z7901 Long term (current) use of anticoagulants: Secondary | ICD-10-CM | POA: Diagnosis not present

## 2018-10-22 DIAGNOSIS — I10 Essential (primary) hypertension: Secondary | ICD-10-CM | POA: Diagnosis not present

## 2018-10-22 DIAGNOSIS — D649 Anemia, unspecified: Secondary | ICD-10-CM | POA: Diagnosis not present

## 2018-10-22 LAB — COMPREHENSIVE METABOLIC PANEL
ALT: 15 U/L (ref 0–44)
AST: 16 U/L (ref 15–41)
Albumin: 3.5 g/dL (ref 3.5–5.0)
Alkaline Phosphatase: 62 U/L (ref 38–126)
Anion gap: 10 (ref 5–15)
BUN: 23 mg/dL (ref 8–23)
CO2: 26 mmol/L (ref 22–32)
Calcium: 9.3 mg/dL (ref 8.9–10.3)
Chloride: 102 mmol/L (ref 98–111)
Creatinine, Ser: 1.11 mg/dL (ref 0.61–1.24)
GFR calc Af Amer: >60 mL/min (ref >60)
GFR calc non Af Amer: >60 mL/min (ref >60)
Glucose, Bld: 164 mg/dL — ABNORMAL HIGH (ref 70–99)
Potassium: 4.2 mmol/L (ref 3.5–5.1)
Sodium: 138 mmol/L (ref 135–145)
Total Bilirubin: 0.4 mg/dL (ref 0.3–1.2)
Total Protein: 7 g/dL (ref 6.5–8.1)

## 2018-10-22 LAB — CBC WITH DIFFERENTIAL/PLATELET
Abs Immature Granulocytes: 0.18 10*3/uL — ABNORMAL HIGH (ref 0.00–0.07)
Basophils Absolute: 0.1 10*3/uL (ref 0.0–0.1)
Basophils Relative: 1 %
Eosinophils Absolute: 0.2 10*3/uL (ref 0.0–0.5)
Eosinophils Relative: 2 %
HCT: 34.7 % — ABNORMAL LOW (ref 39.0–52.0)
Hemoglobin: 10.7 g/dL — ABNORMAL LOW (ref 13.0–17.0)
Immature Granulocytes: 2 %
Lymphocytes Relative: 23 %
Lymphs Abs: 2.2 10*3/uL (ref 0.7–4.0)
MCH: 25 pg — ABNORMAL LOW (ref 26.0–34.0)
MCHC: 30.8 g/dL (ref 30.0–36.0)
MCV: 81.1 fL (ref 80.0–100.0)
Monocytes Absolute: 0.9 10*3/uL (ref 0.1–1.0)
Monocytes Relative: 9 %
Neutro Abs: 6.3 10*3/uL (ref 1.7–7.7)
Neutrophils Relative %: 63 %
Platelets: 281 10*3/uL (ref 150–400)
RBC: 4.28 MIL/uL (ref 4.22–5.81)
RDW: 15.7 % — ABNORMAL HIGH (ref 11.5–15.5)
Smear Review: NORMAL
WBC: 9.8 10*3/uL (ref 4.0–10.5)
nRBC: 0 % (ref 0.0–0.2)

## 2018-10-22 LAB — LACTATE DEHYDROGENASE: LDH: 158 U/L (ref 98–192)

## 2018-10-22 MED ORDER — APIXABAN 5 MG PO TABS: 5.0000 mg | ORAL_TABLET | Freq: Two times a day (BID) | ORAL | 1 refills | Status: DC

## 2018-10-22 NOTE — Assessment & Plan Note (Addendum)
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 On Ibrutinib 420 mg/day; CT AUG 2020- CT C/A/P- STABLE disease; except slightly increase in Left Supraclavicular LN; mild increase in RP LN [by appx 1cm]. Hemoglobin platelets normal at 10.7. White count-9.8; platelets 281.   #Given the overall clinical benefit; and continued tolerance-I would recommend continue ibrutinib for now.  However we will plan reimaging in approximately 3 months.  Discussed that if continued progression of disease noted would recommend Gazyva plus venetoclax.  # Mild anemia-hemoglobin 10.7.? CKD; iron sat-5%; recommend Vitron C a day.   # HTN -again reviewed blood pressure at home 130-s-140s sec to  Ibrutinib; continue lisinopril 40 mg a day/  norvasc 5 mg/day.  Stable.  # Bilateral PE On eliquis 5 mg BID-stable.  New prescription sent.  #Updated the patient's wife, Hoyle Sauer over the phone.  DISPOSITION:  # follow up in 1 month- MD- labs- cbc/cmp/ldh- Dr.B

## 2018-10-22 NOTE — Progress Notes (Signed)
Pt in for follow up denies any concerns today. Reports "powerade has helped him greatly".

## 2018-10-22 NOTE — Progress Notes (Signed)
[facility] NOTE  Patient Care Team: [name], MD as PCP - General (Internal Medicine)  CHIEF COMPLAINTS/PURPOSE OF CONSULTATION: SMALL CELL LYMPHOMA  #  Oncology History Overview Note  # October 2019- SMALL CELL LYMPHOMA; ki-67-10%. STAGE- III/ IV; PET scan- bulky LN above/below Diaphragm; No spleen/liver/bone;   # 22nd October 2019- Bil PE on lovenox xstopped; NOV 2019- Eliquis 5 mg BID.   # DM/HTN/ CKD- stage III -----------------------------------------------   MOLECULAR TESTING: Trisomy 12. 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  Spoke to patient's wife over the phone.  [name] 68 y.o.  male above diagnosis of of SLL/CLL on ibrutinib; incidental bilateral PE is here for follow-up/review results of the CT scan.  Patient denies any bruising or bleeding.  Chronic mild joint pains not any worse.  No nausea no vomiting.  Appetite is good.  No weight loss.  Mild fatigue.  No falls. Review of Systems  Constitutional: Positive for malaise/fatigue. Negative for chills, diaphoresis, fever and weight loss.  HENT: Negative for sore throat.   Eyes: Negative for double vision.  Respiratory: Negative for cough, hemoptysis, sputum production, shortness of breath and wheezing.   Cardiovascular: Negative for chest pain, palpitations, orthopnea and leg swelling.  Gastrointestinal: Negative for abdominal pain, blood in stool, constipation, diarrhea, heartburn and melena.  Genitourinary: Negative for dysuria, frequency and urgency.  Musculoskeletal: Positive for back pain  and joint pain.  Skin: Negative.  Negative for itching and rash.  Neurological: Negative for dizziness, tingling, focal weakness, weakness and headaches.  Endo/Heme/Allergies: Does not bruise/bleed easily.  Psychiatric/Behavioral: Negative for depression. The patient is not nervous/anxious and does not have insomnia.      MEDICAL HISTORY:  Past Medical History:  Diagnosis Date  . Anxiety   . Arthritis   . 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 HOH (hard of hearing)   . Hypertension   . Osteomyelitis of toe ([place]) 06/08/11   right foot  . Seasonal allergies   . Type II diabetes mellitus ([place])    diet controlled    SURGICAL HISTORY: Past Surgical History:  Procedure Laterality Date  . AMPUTATION  06/08/2011   Procedure: AMPUTATION RAY;  Surgeon: Wylene Simmer, MD;  Location: [facility];  Service: Orthopedics;  Laterality: Right;  [place] Amputation  . [place]   "crushed"  . FRACTURE SURGERY    . 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 PILONIDAL CYST / SINUS EXCISION  1970's  . TOE AMPUTATION  06/08/11   partial; right great toe    SOCIAL HISTORY: Social History   Socioeconomic History  . Marital status: Married    Spouse name: Not on file  . Number of children: Not on file  . Years of education: Not on file  . Highest education level: Not on file  Occupational History  . Not on file  Social Needs  . Financial resource strain: Not on file  . Food insecurity    Worry: Not on file  Inability: Not on file  . Transportation needs    Medical: Not on file    Non-medical: Not on file  Tobacco Use  . Smoking status: Former Smoker    Packs/day: 1.50    Years: 22.00    Pack years: 33.00    Types: Cigarettes    Quit date: 05/24/2011    Years since quitting: 7.4  . Smokeless tobacco: Former Systems developer    Types: [place] date: 03/07/1983  Substance and Sexual Activity  . Alcohol use: Yes    Comment: 06/08/11 "no liquor for 2 1/2 years; last beer  05/31/11"  . Drug use: No  . Sexual activity: Yes  Lifestyle  . Physical activity    Days per week: Not on file    Minutes per session: Not on file  . Stress: Not on file  Relationships  . Social Herbalist on phone: Not on file    Gets together: Not on file    Attends religious service: Not on file    Active member of club or organization: Not on file    Attends meetings of clubs or organizations: Not on file    Relationship status: Not on file  . Intimate partner violence    Fear of current or ex partner: Not on file    Emotionally abused: Not on file    Physically abused: Not on file    Forced sexual activity: Not on file  Other Topics Concern  . Not on file  Social History Narrative   Married    FAMILY HISTORY: Family History  Problem Relation Age of Onset  . Prostate cancer Father   . Hypertension Father   . Other Mother        VARICOSE VEINS    ALLERGIES:  is allergic to penicillins.  MEDICATIONS:  Current Outpatient Medications  Medication Sig Dispense Refill  . allopurinol (ZYLOPRIM) 300 MG tablet Take 1 tablet by mouth twice daily 180 tablet 0  . amLODipine (NORVASC) 5 MG tablet Take 1 tablet by mouth once daily 30 tablet 0  . apixaban (ELIQUIS) 5 MG TABS tablet Take 1 tablet (5 mg total) by mouth 2 (two) times daily. 180 tablet 1  . ECHINACEA PO Take 760 mg by mouth daily.    . fluticasone (FLONASE) 50 MCG/ACT nasal spray Place 2 sprays into both nostrils daily as needed for allergies or rhinitis.    . Ginseng 100 MG CAPS Take 100 mg by mouth daily.    . IMBRUVICA 420 MG TABS TAKE 1 TABLET (420 MG) BY MOUTH DAILY. 28 tablet 3  . JANUVIA 100 MG tablet Take 100 mg by mouth daily.  3  . lisinopril (PRINIVIL,ZESTRIL) 40 MG tablet Take 40 mg by mouth daily.    . metFORMIN (GLUCOPHAGE) 500 MG tablet Take 1,000 mg by mouth 2 (two) times daily.    . mupirocin ointment (BACTROBAN) 2 % APPLY WITH Q TIP THREE TIMES DAILY AS NEEDED FOR DRYNESS AND CRUSTING  3   . Omega-3 Fatty Acids (OMEGA 3 500) 500 MG CAPS Take 500 mg by mouth daily.    . ondansetron (ZOFRAN) 4 MG tablet Take 1 tablet (4 mg total) by mouth every 8 (eight) hours as needed for nausea or vomiting. 20 tablet 3  . ONE TOUCH ULTRA TEST test strip USE 1 STRIP TO CHECK GLUCOSE ONCE A WEEK  99  . pravastatin (PRAVACHOL) 40 MG tablet Take 40 mg by mouth daily.    Marland Kitchen  Saw Palmetto 450 MG CAPS Take 450 mg by mouth daily.     No current facility-administered medications for this visit.       Marland Kitchen  PHYSICAL EXAMINATION: ECOG PERFORMANCE STATUS: 1 - Symptomatic but completely ambulatory  Vitals:   10/22/18 1417  BP: (!) 182/92  Pulse: 89  Resp: 18  Temp: 97.8 F (36.6 C)   Filed Weights   10/22/18 1417  Weight: 208 lb (94.3 kg)    Physical Exam  Constitutional: He is oriented to person, place, and time and well-developed, well-nourished, and in no distress.  Alone.  HENT:  Head: Normocephalic and atraumatic.  Mouth/Throat: Oropharynx is clear and moist. No oropharyngeal exudate.  Eyes: Pupils are equal, round, and reactive to light.  Neck: Normal range of motion. Neck supple.  Cardiovascular: Normal rate and regular rhythm.  Pulmonary/Chest: No respiratory distress. He has no wheezes.  Abdominal: Soft. Bowel sounds are normal. He exhibits no distension and no mass. There is no abdominal tenderness. There is no rebound and no guarding.  Musculoskeletal: Normal range of motion.        General: No tenderness or edema.  Lymphadenopathy:  Significant improvement of the lymphadenopathy in the neck/resolved; scattered approximately 1 cm few lymph nodes present in the right left underarm.  Neurological: He is alert and oriented to person, place, and time.  Skin: Skin is warm.  Psychiatric: Affect normal.     LABORATORY DATA:  I have reviewed the data as listed Lab Results  Component Value Date   WBC 9.8 10/22/2018   HGB 10.7 (L) 10/22/2018   HCT 34.7 (L) 10/22/2018   MCV  81.1 10/22/2018   PLT 281 10/22/2018   Recent Labs    12/26/17 1000  08/20/18 1415 09/24/18 1335 10/22/18 1339  NA  --    < > 138 136 138  K  --    < > 4.4 4.1 4.2  CL  --    < > 103 102 102  CO2  --    < > _0 GLUCOSE  --    < > 163* 146* 164*  BUN  --    < > 29* 25* 23  CREATININE  --    < > 1.31* 1.35* 1.11  CALCIUM  --    < > 9.3 8.6* 9.3  GFRNONAA  --    < > 56* 54* >60  GFRAA  --    < > >60 >60 >60  PROT 6.7   < > 7.1 6.4* 7.0  ALBUMIN 4.2   < > 3.6 3.1* 3.5  AST 17   < > 11* 14* 16  ALT 14   < > _1 ALKPHOS 63   < > 64 46 62  BILITOT 0.6   < > 0.3 0.5 0.4  BILIDIR 0.1  --   --   --   --   IBILI 0.5  --   --   --   --    < > = values in this interval not displayed.    RADIOGRAPHIC STUDIES: I have personally reviewed the radiological images as listed and agreed with the findings in the report. Ct Abdomen Pelvis Wo Contrast  Result Date: 10/17/2018 CLINICAL DATA:  Small B-cell lymphoma diagnosed [id number]. hypermetabolic bulky nodal silhouette normally is CT flank her for disease on PET-CT scan EXAM: CT CHEST, ABDOMEN AND PELVIS WITHOUT CONTRAST TECHNIQUE: Multidetector CT imaging of the chest, abdomen and pelvis was performed following  the standard protocol without IV contrast. COMPARISON:  CT scan 04/12/2018, PET-CT 01/01/2018 FINDINGS: CT CHEST FINDINGS Cardiovascular: Coronary artery calcification and aortic atherosclerotic calcification. Mediastinum/Nodes: Prominent axial lymphadenopathy again demonstrated. Example RIGHT axial lymph node measures 1.0 cm short axis compared to 1.3 cm (image 16/4). Visually nodes are reduced in size. LEFT supraclavicular lymph node measuring 1.5 cm is enlarged from comparison exam (1.0 cm). In the mediastinum no pathologic enlarged lymph nodes. Lungs/Pleura: Subpleural nodule in the RIGHT upper lobe measures 5 mm compared to 6 mm. Small nodule in the RIGHT lower lobe measures 4 mm compared to 4 mm (image 82/3). No new pulmonary  nodules. Musculoskeletal: No aggressive osseous lesion. CT ABDOMEN AND PELVIS FINDINGS Hepatobiliary: No focal hepatic lesion. No biliary ductal dilatation. Gallbladder is normal. Common bile duct is normal. Pancreas: Pancreas is normal. No ductal dilatation. No pancreatic inflammation. Spleen: Normal volume. Adrenals/urinary tract: Adrenal glands and kidneys are normal. The ureters and bladder normal. Stomach/Bowel: Stomach, small bowel, appendix, and cecum are normal. The colon and rectosigmoid colon are normal. Vascular/Lymphatic: Abdominal aorta nonaneurysmal. Tubal calcification. Bulky retroperitoneal adenopathy again demonstrated. Largest lymph node LEFT aorta measures 5.7 cm (image 95/2) compared to 4.5 cm. Large LEFT operator node measures 3.4 cm short axis compared with 3.8 cm. Reproductive: Prostate normal Other: No free fluid. Musculoskeletal: No aggressive osseous lesion. IMPRESSION: Chest Impression: 1. Axial lymph nodes are stable to the slightly decreased in size. 2. Interval enlargement of LEFT supraclavicular lymph node. 3. Stable small pulmonary nodules. 4. No mediastinal lymphadenopathy Abdomen / Pelvis Impression: 1. Interval enlargement of retroperitoneal periaortic nodal mass. 2. Interval decrease in size of LEFT operator nodal mass. 3. No new lymphadenopathy. 4. Normal volume spleen. 5. No skeletal metastasis identified Electronically Signed   By: Suzy Bouchard M.D.   On: 10/17/2018 12:23   Ct Chest Wo Contrast  Result Date: 10/17/2018 CLINICAL DATA:  Small B-cell lymphoma diagnosed [id number]. hypermetabolic bulky nodal silhouette normally is CT flank her for disease on PET-CT scan EXAM: CT CHEST, ABDOMEN AND PELVIS WITHOUT CONTRAST TECHNIQUE: Multidetector CT imaging of the chest, abdomen and pelvis was performed following the standard protocol without IV contrast. COMPARISON:  CT scan 04/12/2018, PET-CT 01/01/2018 FINDINGS: CT CHEST FINDINGS Cardiovascular: Coronary artery calcification  and aortic atherosclerotic calcification. Mediastinum/Nodes: Prominent axial lymphadenopathy again demonstrated. Example RIGHT axial lymph node measures 1.0 cm short axis compared to 1.3 cm (image 16/4). Visually nodes are reduced in size. LEFT supraclavicular lymph node measuring 1.5 cm is enlarged from comparison exam (1.0 cm). In the mediastinum no pathologic enlarged lymph nodes. Lungs/Pleura: Subpleural nodule in the RIGHT upper lobe measures 5 mm compared to 6 mm. Small nodule in the RIGHT lower lobe measures 4 mm compared to 4 mm (image 82/3). No new pulmonary nodules. Musculoskeletal: No aggressive osseous lesion. CT ABDOMEN AND PELVIS FINDINGS Hepatobiliary: No focal hepatic lesion. No biliary ductal dilatation. Gallbladder is normal. Common bile duct is normal. Pancreas: Pancreas is normal. No ductal dilatation. No pancreatic inflammation. Spleen: Normal volume. Adrenals/urinary tract: Adrenal glands and kidneys are normal. The ureters and bladder normal. Stomach/Bowel: Stomach, small bowel, appendix, and cecum are normal. The colon and rectosigmoid colon are normal. Vascular/Lymphatic: Abdominal aorta nonaneurysmal. Tubal calcification. Bulky retroperitoneal adenopathy again demonstrated. Largest lymph node LEFT aorta measures 5.7 cm (image 95/2) compared to 4.5 cm. Large LEFT operator node measures 3.4 cm short axis compared with 3.8 cm. Reproductive: Prostate normal Other: No free fluid. Musculoskeletal: No aggressive osseous lesion. IMPRESSION: Chest Impression: 1. Axial lymph  nodes are stable to the slightly decreased in size. 2. Interval enlargement of LEFT supraclavicular lymph node. 3. Stable small pulmonary nodules. 4. No mediastinal lymphadenopathy Abdomen / Pelvis Impression: 1. Interval enlargement of retroperitoneal periaortic nodal mass. 2. Interval decrease in size of LEFT operator nodal mass. 3. No new lymphadenopathy. 4. Normal volume spleen. 5. No skeletal metastasis identified  Electronically Signed   By: Suzy Bouchard M.D.   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  IGVH mutated. On Ibrutinib 420 mg/day; CT AUG 2020- CT C/A/P- STABLE disease; except slightly increase in Left Supraclavicular LN; mild increase in RP LN [by appx 1cm]. Hemoglobin platelets normal at 10.7. White count-9.8; platelets 281.   #Given the overall clinical benefit; and continued tolerance-I would recommend continue ibrutinib for now.  However we will plan reimaging in approximately 3 months.  Discussed that if continued progression of disease noted would recommend Gazyva plus venetoclax.  # Mild anemia-hemoglobin 10.7.? CKD; iron sat-5%; recommend Vitron C a day.   # HTN -again reviewed blood pressure at home 130-s-140s sec to  Ibrutinib; continue lisinopril 40 mg a day/  norvasc 5 mg/day.  Stable.  # Bilateral PE On eliquis 5 mg BID-stable.  New prescription sent.  #Updated the patient's wife, Hoyle Sauer over the phone.  DISPOSITION:  # follow up in 1 month- MD- labs- cbc/cmp/ldh- Dr.B  All questions were answered. The patient knows to call the clinic with any problems, questions or concerns.     Cammie Sickle, MD 10/22/2018 2:57 PM

## 2018-11-05 MED FILL — IMBRUVICA 420 MG TAB: 420 | 28 days supply | Qty: 28 | Fill #3

## 2018-11-13 ENCOUNTER — Other Ambulatory Visit: Payer: Self-pay | Admitting: Internal Medicine

## 2018-11-13 DIAGNOSIS — R110 Nausea: Secondary | ICD-10-CM

## 2018-11-20 ENCOUNTER — Inpatient Hospital Stay (HOSPITAL_BASED_OUTPATIENT_CLINIC_OR_DEPARTMENT_OTHER): Payer: Medicare Other | Admitting: Internal Medicine

## 2018-11-20 ENCOUNTER — Inpatient Hospital Stay: Payer: Medicare Other | Attending: Internal Medicine

## 2018-11-20 ENCOUNTER — Telehealth: Payer: Self-pay | Admitting: Pharmacy Technician

## 2018-11-20 ENCOUNTER — Other Ambulatory Visit: Payer: Self-pay

## 2018-11-20 DIAGNOSIS — I2699 Other pulmonary embolism without acute cor pulmonale: Secondary | ICD-10-CM | POA: Diagnosis not present

## 2018-11-20 DIAGNOSIS — Z7901 Long term (current) use of anticoagulants: Secondary | ICD-10-CM | POA: Insufficient documentation

## 2018-11-20 DIAGNOSIS — C8308 Small cell B-cell lymphoma, lymph nodes of multiple sites: Secondary | ICD-10-CM | POA: Insufficient documentation

## 2018-11-20 DIAGNOSIS — I10 Essential (primary) hypertension: Secondary | ICD-10-CM | POA: Insufficient documentation

## 2018-11-20 DIAGNOSIS — Z87891 Personal history of nicotine dependence: Secondary | ICD-10-CM | POA: Diagnosis not present

## 2018-11-20 DIAGNOSIS — D649 Anemia, unspecified: Secondary | ICD-10-CM | POA: Insufficient documentation

## 2018-11-20 LAB — CBC WITH DIFFERENTIAL/PLATELET
Abs Immature Granulocytes: 0.11 10*3/uL — ABNORMAL HIGH (ref 0.00–0.07)
Basophils Absolute: 0.1 10*3/uL (ref 0.0–0.1)
Basophils Relative: 1 %
Eosinophils Absolute: 0.1 10*3/uL (ref 0.0–0.5)
Eosinophils Relative: 1 %
HCT: 33.7 % — ABNORMAL LOW (ref 39.0–52.0)
Hemoglobin: 10.3 g/dL — ABNORMAL LOW (ref 13.0–17.0)
Immature Granulocytes: 1 %
Lymphocytes Relative: 18 %
Lymphs Abs: 2.1 10*3/uL (ref 0.7–4.0)
MCH: 25 pg — ABNORMAL LOW (ref 26.0–34.0)
MCHC: 30.6 g/dL (ref 30.0–36.0)
MCV: 81.8 fL (ref 80.0–100.0)
Monocytes Absolute: 1 10*3/uL (ref 0.1–1.0)
Monocytes Relative: 8 %
Neutro Abs: 8.2 10*3/uL — ABNORMAL HIGH (ref 1.7–7.7)
Neutrophils Relative %: 71 %
Platelets: 302 10*3/uL (ref 150–400)
RBC: 4.12 MIL/uL — ABNORMAL LOW (ref 4.22–5.81)
RDW: 16.7 % — ABNORMAL HIGH (ref 11.5–15.5)
WBC: 11.5 10*3/uL — ABNORMAL HIGH (ref 4.0–10.5)
nRBC: 0 % (ref 0.0–0.2)

## 2018-11-20 LAB — COMPREHENSIVE METABOLIC PANEL
ALT: 12 U/L (ref 0–44)
AST: 13 U/L — ABNORMAL LOW (ref 15–41)
Albumin: 3.5 g/dL (ref 3.5–5.0)
Alkaline Phosphatase: 65 U/L (ref 38–126)
Anion gap: 8 (ref 5–15)
BUN: 23 mg/dL (ref 8–23)
CO2: 28 mmol/L (ref 22–32)
Calcium: 9.3 mg/dL (ref 8.9–10.3)
Chloride: 101 mmol/L (ref 98–111)
Creatinine, Ser: 1.27 mg/dL — ABNORMAL HIGH (ref 0.61–1.24)
GFR calc Af Amer: >60 mL/min (ref >60)
GFR calc non Af Amer: 58 mL/min — ABNORMAL LOW (ref >60)
Glucose, Bld: 212 mg/dL — ABNORMAL HIGH (ref 70–99)
Potassium: 4.1 mmol/L (ref 3.5–5.1)
Sodium: 137 mmol/L (ref 135–145)
Total Bilirubin: 0.4 mg/dL (ref 0.3–1.2)
Total Protein: 6.6 g/dL (ref 6.5–8.1)

## 2018-11-20 LAB — LACTATE DEHYDROGENASE: LDH: 138 U/L (ref 98–192)

## 2018-11-20 NOTE — Progress Notes (Signed)
[place] NOTE  Patient Care Team: [name], MD as PCP - General (Internal Medicine)  CHIEF COMPLAINTS/PURPOSE OF CONSULTATION: SMALL CELL LYMPHOMA  #  Oncology History Overview Note  # October 2019- SMALL CELL LYMPHOMA; ki-67-10%. STAGE- III/ IV; PET scan- bulky LN [Dr.Vaught] above/below Diaphragm; No spleen/liver/bone;   # 22nd October 2019- Bil PE on lovenox xstopped; NOV 2019- Eliquis 5 mg BID. AUG 2020-slight incresae in RP LN ~1cm; continue Ibrutinib  # DM/HTN/ CKD- stage III -----------------------------------------------   MOLECULAR TESTING: Trisomy 12. 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  Spoke to patient's wife over the phone.  [name] 68 y.o.  male above diagnosis of of SLL/CLL on ibrutinib; incidental bilateral PE is here for follow-up/review results of the CT scan.  Patient denies any bruising or bleeding.  Chronic mild joint pains not any worse.  No nausea no vomiting.  Appetite is good.  No weight loss.  Mild fatigue.  No falls. Review of Systems  Constitutional: Positive for malaise/fatigue. Negative for chills, diaphoresis, fever and weight loss.  HENT: Negative for sore throat.   Eyes: Negative for double vision.  Respiratory: Negative for cough, hemoptysis, sputum production, shortness of breath and wheezing.   Cardiovascular: Negative for chest pain, palpitations, orthopnea and leg swelling.  Gastrointestinal: Negative for abdominal pain, blood in stool, constipation, diarrhea, heartburn and melena.  Genitourinary: Negative for  dysuria, frequency and urgency.  Musculoskeletal: Positive for back pain and joint pain.  Skin: Negative.  Negative for itching and rash.  Neurological: Negative for dizziness, tingling, focal weakness, weakness and headaches.  Endo/Heme/Allergies: Does not bruise/bleed easily.  Psychiatric/Behavioral: Negative for depression. The patient is not nervous/anxious and does not have insomnia.      MEDICAL HISTORY:  Past Medical History:  Diagnosis Date  . Anxiety   . Arthritis   . 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 HOH (hard of hearing)   . Hypertension   . Osteomyelitis of toe ([place]) 06/08/11   right foot  . Seasonal allergies   . Type II diabetes mellitus ([place])    diet controlled    SURGICAL HISTORY: Past Surgical History:  Procedure Laterality Date  . AMPUTATION  06/08/2011   Procedure: AMPUTATION RAY;  Surgeon: Wylene Simmer, MD;  Location: [facility];  Service: Orthopedics;  Laterality: Right;  Righ Hallux Amputation  . [place]   "crushed"  . FRACTURE SURGERY    . 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 PILONIDAL CYST / SINUS EXCISION  1970's  . TOE AMPUTATION  06/08/11   partial; right great toe    SOCIAL HISTORY: Social History   Socioeconomic History  . Marital status: Married    Spouse name: Not on file  . Number of children: Not on file  . Years of education: Not on file  . Highest education level: Not on file  Occupational History  . Not on file  Social Needs  . Financial resource strain: Not on file  .  Food insecurity    Worry: Not on file    Inability: Not on file  . Transportation needs    Medical: Not on file    Non-medical: Not on file  Tobacco Use  . Smoking status: Former Smoker    Packs/day: 1.50    Years: 22.00    Pack years: 33.00    Types: Cigarettes    Quit date: 05/24/2011    Years since quitting: 7.4  . Smokeless tobacco: Former Systems developer    Types: [place] date: 03/07/1983  Substance and Sexual Activity  .  Alcohol use: Yes    Comment: 06/08/11 "no liquor for 2 1/2 years; last beer 05/31/11"  . Drug use: No  . Sexual activity: Yes  Lifestyle  . Physical activity    Days per week: Not on file    Minutes per session: Not on file  . Stress: Not on file  Relationships  . Social Herbalist on phone: Not on file    Gets together: Not on file    Attends religious service: Not on file    Active member of club or organization: Not on file    Attends meetings of clubs or organizations: Not on file    Relationship status: Not on file  . Intimate partner violence    Fear of current or ex partner: Not on file    Emotionally abused: Not on file    Physically abused: Not on file    Forced sexual activity: Not on file  Other Topics Concern  . Not on file  Social History Narrative   Married    FAMILY HISTORY: Family History  Problem Relation Age of Onset  . Prostate cancer Father   . Hypertension Father   . Other Mother        VARICOSE VEINS    ALLERGIES:  is allergic to penicillins.  MEDICATIONS:  Current Outpatient Medications  Medication Sig Dispense Refill  . allopurinol (ZYLOPRIM) 300 MG tablet Take 1 tablet by mouth twice daily 180 tablet 0  . amLODipine (NORVASC) 5 MG tablet Take 1 tablet by mouth once daily 30 tablet 0  . apixaban (ELIQUIS) 5 MG TABS tablet Take 1 tablet (5 mg total) by mouth 2 (two) times daily. 180 tablet 1  . ECHINACEA PO Take 760 mg by mouth daily.    . fluticasone (FLONASE) 50 MCG/ACT nasal spray Place 2 sprays into both nostrils daily as needed for allergies or rhinitis.    . Ginseng 100 MG CAPS Take 100 mg by mouth daily.    . IMBRUVICA 420 MG TABS TAKE 1 TABLET (420 MG) BY MOUTH DAILY. 28 tablet 3  . JANUVIA 100 MG tablet Take 100 mg by mouth daily.  3  . lisinopril (PRINIVIL,ZESTRIL) 40 MG tablet Take 40 mg by mouth daily.    . metFORMIN (GLUCOPHAGE) 500 MG tablet Take 1,000 mg by mouth 2 (two) times daily.    . mupirocin ointment (BACTROBAN) 2  % APPLY WITH Q TIP THREE TIMES DAILY AS NEEDED FOR DRYNESS AND CRUSTING  3  . Omega-3 Fatty Acids (OMEGA 3 500) 500 MG CAPS Take 500 mg by mouth daily.    . ondansetron (ZOFRAN) 4 MG tablet TAKE 1 TABLET BY MOUTH EVERY 8 HOURS AS NEEDED FOR NAUSEA FOR VOMITING 20 tablet 0  . ONE TOUCH ULTRA TEST test strip USE 1 STRIP TO CHECK GLUCOSE ONCE A WEEK  99  . pravastatin (PRAVACHOL) 40 MG tablet  Take 40 mg by mouth daily.    . Saw Palmetto 450 MG CAPS Take 450 mg by mouth daily.     No current facility-administered medications for this visit.       Marland Kitchen  PHYSICAL EXAMINATION: ECOG PERFORMANCE STATUS: 1 - Symptomatic but completely ambulatory  Vitals:   11/20/18 1453  BP: (!) 184/84  Pulse: 91  Temp: 98.9 F (37.2 C)   Filed Weights   11/20/18 1453  Weight: 204 lb (92.5 kg)    Physical Exam  Constitutional: He is oriented to person, place, and time and well-developed, well-nourished, and in no distress.  Alone.  HENT:  Head: Normocephalic and atraumatic.  Mouth/Throat: Oropharynx is clear and moist. No oropharyngeal exudate.  Eyes: Pupils are equal, round, and reactive to light.  Neck: Normal range of motion. Neck supple.  Cardiovascular: Normal rate and regular rhythm.  Pulmonary/Chest: No respiratory distress. He has no wheezes.  Abdominal: Soft. Bowel sounds are normal. He exhibits no distension and no mass. There is no abdominal tenderness. There is no rebound and no guarding.  Musculoskeletal: Normal range of motion.        General: No tenderness or edema.  Lymphadenopathy:  Significant improvement of the lymphadenopathy in the neck/resolved; scattered approximately 1 cm few lymph nodes present in the right left underarm.  Neurological: He is alert and oriented to person, place, and time.  Skin: Skin is warm.  Psychiatric: Affect normal.     LABORATORY DATA:  I have reviewed the data as listed Lab Results  Component Value Date   WBC 11.5 (H) 11/20/2018   HGB 10.3 (L)  11/20/2018   HCT 33.7 (L) 11/20/2018   MCV 81.8 11/20/2018   PLT 302 11/20/2018   Recent Labs    12/26/17 1000  09/24/18 1335 10/22/18 1339 11/20/18 1401  NA  --    < > 136 138 137  K  --    < > 4.1 4.2 4.1  CL  --    < > 102 102 101  CO2  --    < > '25 26 28  ' GLUCOSE  --    < > 146* 164* 212*  BUN  --    < > 25* 23 23  CREATININE  --    < > 1.35* 1.11 1.27*  CALCIUM  --    < > 8.6* 9.3 9.3  GFRNONAA  --    < > 54* >60 58*  GFRAA  --    < > >60 >60 >60  PROT 6.7   < > 6.4* 7.0 6.6  ALBUMIN 4.2   < > 3.1* 3.5 3.5  AST 17   < > 14* 16 13*  ALT 14   < > '13 15 12  ' ALKPHOS 63   < > 46 62 65  BILITOT 0.6   < > 0.5 0.4 0.4  BILIDIR 0.1  --   --   --   --   IBILI 0.5  --   --   --   --    < > = values in this interval not displayed.    RADIOGRAPHIC STUDIES: I have personally reviewed the radiological images as listed and agreed with the findings in the report. No results found.  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  IGVH mutated. On Ibrutinib 420 mg/day; CT AUG 2020- CT C/A/P- STABLE disease; except slightly increase  in Left Supraclavicular LN; mild increase in RP LN [by appx 1cm].   #Continue Imbruvica at this time; tolerating fairly well. hemoglobin today 10.3. White count-11; platelets 320.  We will plan repeat imaging November. Discussed that if continued progression of disease noted would recommend Gazyva plus venetoclax.  # Mild anemia-hemoglobin 10.7.? CKD; iron sat-5%; recommend Vitron C a day.   # HTN -again reviewed blood pressure at home 130-s-140s sec to  Ibrutinib; continue lisinopril 40 mg a day/  norvasc 5 mg/day.  Stable.   # Bilateral PE On eliquis 5 mg BID-stable  # I spoke at length with the patient's family, Hoyle Sauer- regarding the patient's clinical status/plan of care.  Family agreement.   DISPOSITION:  # follow up in 1 month- MD- labs- cbc/cmp/ldh- Dr.B  Cc;  Dr.Vaught.   All questions were answered. The patient knows to call the clinic with any problems, questions or concerns.     Cammie Sickle, MD 11/20/2018 8:43 PM

## 2018-11-20 NOTE — Telephone Encounter (Signed)
Oral Oncology Patient Advocate Encounter  Prior Authorization for [name] has been approved.    PA# [id number] Effective dates: 10/21/2018 through 11/20/2019  Patients co-pay is $40.00.  Patient has a copay card that reduces OOP to $0.00.  Oral Oncology Clinic will continue to follow.   [place] Patient [place] Phone [phone] Fax [phone] 11/20/2018 4:02 PM

## 2018-11-20 NOTE — Telephone Encounter (Signed)
Oral Oncology Patient Advocate Encounter  Patient brought letter from insurance to appointment.  Letter stated that current prior authorization will expire on 01/07/2019.  I told patient that I would submit a new PA so there would be no delay in therapy.  Received notification from [facility] that prior authorization for Imbruvica is required.  PA submitted on CoverMyMeds Key [id number] Status is pending  Oral Oncology Clinic will continue to follow.  [place] Patient [place] Phone [phone] Fax [phone] 11/20/2018 4:01 PM

## 2018-11-20 NOTE — Assessment & Plan Note (Addendum)
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 On Ibrutinib 420 mg/day; CT AUG 2020- CT C/A/P- STABLE disease; except slightly increase in Left Supraclavicular LN; mild increase in RP LN [by appx 1cm].   #Continue Imbruvica at this time; tolerating fairly well. hemoglobin today 10.3. White count-11; platelets 320.  We will plan repeat imaging November. Discussed that if continued progression of disease noted would recommend Gazyva plus venetoclax.  # Mild anemia-hemoglobin 10.7.? CKD; iron sat-5%; recommend Vitron C a day.   # HTN -again reviewed blood pressure at home 130-s-140s sec to  Ibrutinib; continue lisinopril 40 mg a day/  norvasc 5 mg/day.  Stable.   # Bilateral PE On eliquis 5 mg BID-stable  # I spoke at length with the patient's family, [name]- regarding the patient's clinical status/plan of care.  Family agreement.   DISPOSITION:  # follow up in 1 month- MD- labs- cbc/cmp/ldh- Dr.B  Cc; Dr.Vaught.

## 2018-12-02 ENCOUNTER — Other Ambulatory Visit: Payer: Self-pay | Admitting: Internal Medicine

## 2018-12-02 DIAGNOSIS — C8308 Small cell B-cell lymphoma, lymph nodes of multiple sites: Secondary | ICD-10-CM

## 2018-12-04 MED FILL — IMBRUVICA 420 MG TAB: 420 | 28 days supply | Qty: 28 | Fill #0

## 2018-12-09 ENCOUNTER — Other Ambulatory Visit: Payer: Self-pay | Admitting: *Deleted

## 2018-12-09 DIAGNOSIS — R110 Nausea: Secondary | ICD-10-CM

## 2018-12-09 MED ORDER — ONDANSETRON HCL 4 MG PO TABS: ORAL_TABLET | ORAL | 2 refills | Status: DC

## 2018-12-09 MED ORDER — AMLODIPINE BESYLATE 5 MG PO TABS: 5.0000 mg | ORAL_TABLET | Freq: Every day | ORAL | 0 refills | Status: DC

## 2018-12-09 NOTE — Telephone Encounter (Signed)
Received faxed refill request from [facility] for zofran and norvasc.  Per Dr Rogue Bussing okay to refill

## 2018-12-18 ENCOUNTER — Inpatient Hospital Stay (HOSPITAL_BASED_OUTPATIENT_CLINIC_OR_DEPARTMENT_OTHER): Payer: Medicare Other | Admitting: Internal Medicine

## 2018-12-18 ENCOUNTER — Other Ambulatory Visit: Payer: Self-pay

## 2018-12-18 ENCOUNTER — Inpatient Hospital Stay: Payer: Medicare Other | Attending: Internal Medicine

## 2018-12-18 DIAGNOSIS — D649 Anemia, unspecified: Secondary | ICD-10-CM | POA: Diagnosis not present

## 2018-12-18 DIAGNOSIS — C8308 Small cell B-cell lymphoma, lymph nodes of multiple sites: Secondary | ICD-10-CM | POA: Insufficient documentation

## 2018-12-18 DIAGNOSIS — N183 Chronic kidney disease, stage 3 unspecified: Secondary | ICD-10-CM | POA: Insufficient documentation

## 2018-12-18 DIAGNOSIS — I2699 Other pulmonary embolism without acute cor pulmonale: Secondary | ICD-10-CM | POA: Diagnosis not present

## 2018-12-18 DIAGNOSIS — I129 Hypertensive chronic kidney disease with stage 1 through stage 4 chronic kidney disease, or unspecified chronic kidney disease: Secondary | ICD-10-CM | POA: Diagnosis not present

## 2018-12-18 DIAGNOSIS — Z7901 Long term (current) use of anticoagulants: Secondary | ICD-10-CM | POA: Diagnosis not present

## 2018-12-18 DIAGNOSIS — Z87891 Personal history of nicotine dependence: Secondary | ICD-10-CM | POA: Diagnosis not present

## 2018-12-18 DIAGNOSIS — Z7984 Long term (current) use of oral hypoglycemic drugs: Secondary | ICD-10-CM | POA: Diagnosis not present

## 2018-12-18 DIAGNOSIS — E1122 Type 2 diabetes mellitus with diabetic chronic kidney disease: Secondary | ICD-10-CM | POA: Diagnosis not present

## 2018-12-18 LAB — COMPREHENSIVE METABOLIC PANEL
ALT: 13 U/L (ref 0–44)
AST: 14 U/L — ABNORMAL LOW (ref 15–41)
Albumin: 3.5 g/dL (ref 3.5–5.0)
Alkaline Phosphatase: 60 U/L (ref 38–126)
Anion gap: 9 (ref 5–15)
BUN: 26 mg/dL — ABNORMAL HIGH (ref 8–23)
CO2: 28 mmol/L (ref 22–32)
Calcium: 10 mg/dL (ref 8.9–10.3)
Chloride: 101 mmol/L (ref 98–111)
Creatinine, Ser: 1.23 mg/dL (ref 0.61–1.24)
GFR calc Af Amer: >60 mL/min (ref >60)
GFR calc non Af Amer: >60 mL/min (ref >60)
Glucose, Bld: 171 mg/dL — ABNORMAL HIGH (ref 70–99)
Potassium: 4.2 mmol/L (ref 3.5–5.1)
Sodium: 138 mmol/L (ref 135–145)
Total Bilirubin: 0.3 mg/dL (ref 0.3–1.2)
Total Protein: 6.6 g/dL (ref 6.5–8.1)

## 2018-12-18 LAB — CBC WITH DIFFERENTIAL/PLATELET
Abs Immature Granulocytes: 0.07 10*3/uL (ref 0.00–0.07)
Basophils Absolute: 0.1 10*3/uL (ref 0.0–0.1)
Basophils Relative: 1 %
Eosinophils Absolute: 0.1 10*3/uL (ref 0.0–0.5)
Eosinophils Relative: 0 %
HCT: 34.2 % — ABNORMAL LOW (ref 39.0–52.0)
Hemoglobin: 10.5 g/dL — ABNORMAL LOW (ref 13.0–17.0)
Immature Granulocytes: 1 %
Lymphocytes Relative: 13 %
Lymphs Abs: 1.9 10*3/uL (ref 0.7–4.0)
MCH: 24.8 pg — ABNORMAL LOW (ref 26.0–34.0)
MCHC: 30.7 g/dL (ref 30.0–36.0)
MCV: 80.9 fL (ref 80.0–100.0)
Monocytes Absolute: 1 10*3/uL (ref 0.1–1.0)
Monocytes Relative: 7 %
Neutro Abs: 11.7 10*3/uL — ABNORMAL HIGH (ref 1.7–7.7)
Neutrophils Relative %: 78 %
Platelets: 334 10*3/uL (ref 150–400)
RBC: 4.23 MIL/uL (ref 4.22–5.81)
RDW: 15.8 % — ABNORMAL HIGH (ref 11.5–15.5)
WBC: 14.9 10*3/uL — ABNORMAL HIGH (ref 4.0–10.5)
nRBC: 0 % (ref 0.0–0.2)

## 2018-12-18 LAB — LACTATE DEHYDROGENASE: LDH: 154 U/L (ref 98–192)

## 2018-12-18 NOTE — Assessment & Plan Note (Addendum)
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 On Ibrutinib 420 mg/day; CT AUG 2020- CT C/A/P- STABLE disease; except slightly increase in Left Supraclavicular LN; mild increase in RP LN [by appx 1cm].   # Continue Imbruvica at this time; tolerating fairly well. hemoglobin today 10.5. White count-11; platelets 320.  We will plan repeat imaging November. Discussed that if continued progression of disease noted would recommend Gazyva plus venetoclax.  # Mild anemia-hemoglobin 10.7.? CKD; iron sat-5%; recommend Vitron C a day.   # HTN -again reviewed blood pressure at home 130-s-140s sec to  Ibrutinib; continue lisinopril 40 mg a day/  norvasc 5 mg/day.  Stable.   # Bilateral PE On eliquis 5 mg BID-stable  # I spoke at length with the patient's family, [name]- regarding the patient's clinical status/plan of care.  Family agreement.   DISPOSITION:  # follow up ~23rd NOV MD- labs- cbc/cmp/ldh-CT/C/A/P prior- Dr.B  Cc; Dr.Vaught.

## 2018-12-18 NOTE — Progress Notes (Signed)
[place] NOTE  Patient Care Team: [name], MD as PCP - General (Internal Medicine)  CHIEF COMPLAINTS/PURPOSE OF CONSULTATION: SMALL CELL LYMPHOMA  #  Oncology History Overview Note  # October 2019- SMALL CELL LYMPHOMA; ki-67-10%. STAGE- III/ IV; PET scan- bulky LN [Dr.Vaught] above/below Diaphragm; No spleen/liver/bone;   # 22nd October 2019- Bil PE on lovenox xstopped; NOV 2019- Eliquis 5 mg BID. AUG 2020-slight incresae in RP LN ~1cm; continue Ibrutinib  # DM/HTN/ CKD- stage III -----------------------------------------------   MOLECULAR TESTING: Trisomy 12. 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  Spoke to patient's wife over the phone.  [name] 68 y.o.  male above diagnosis of of SLL/CLL on ibrutinib; incidental bilateral PE is here for follow-up.  As per the wife patient had a good month.  He continues to have mild fatigue on exertion.  Otherwise no new shortness of breath or cough.  His blood pressures 130s to 140s at home.  He continues to be on Eliquis.  No falls.  No bleeding tendencies.. Review of Systems  Constitutional: Positive for malaise/fatigue. Negative for chills, diaphoresis, fever and weight loss.  HENT: Negative for sore throat.   Eyes: Negative for double vision.  Respiratory: Negative for cough, hemoptysis, sputum production, shortness of breath and wheezing.   Cardiovascular: Negative for chest pain, palpitations, orthopnea and leg swelling.  Gastrointestinal: Negative for abdominal pain, blood in stool, constipation, diarrhea,  heartburn and melena.  Genitourinary: Negative for dysuria, frequency and urgency.  Musculoskeletal: Positive for back pain and joint pain.  Skin: Negative.  Negative for itching and rash.  Neurological: Negative for dizziness, tingling, focal weakness, weakness and headaches.  Endo/Heme/Allergies: Does not bruise/bleed easily.  Psychiatric/Behavioral: Negative for depression. The patient is not nervous/anxious and does not have insomnia.      MEDICAL HISTORY:  Past Medical History:  Diagnosis Date  . Anxiety   . Arthritis   . 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 HOH (hard of hearing)   . Hypertension   . Osteomyelitis of toe ([place]) 06/08/11   right foot  . Seasonal allergies   . Type II diabetes mellitus ([place])    diet controlled    SURGICAL HISTORY: Past Surgical History:  Procedure Laterality Date  . AMPUTATION  06/08/2011   Procedure: AMPUTATION RAY;  Surgeon: Wylene Simmer, MD;  Location: [facility];  Service: Orthopedics;  Laterality: Right;  Righ Hallux Amputation  . [place]   "crushed"  . FRACTURE SURGERY    . 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 PILONIDAL CYST / SINUS EXCISION  1970's  . TOE AMPUTATION  06/08/11   partial; right great toe    SOCIAL HISTORY: Social History   Socioeconomic History  . Marital status: Married    Spouse name: Not on file  . Number of children: Not on file  . Years of education: Not on file  . Highest education level: Not on file  Occupational History  . Not on file  Social Needs  . Financial resource strain: Not on file  . Food insecurity    Worry: Not on file    Inability: Not on file  . Transportation needs    Medical: Not on file    Non-medical: Not on file  Tobacco Use  . Smoking status: Former Smoker    Packs/day: 1.50    Years: 22.00    Pack years: 33.00    Types: Cigarettes    Quit date: 05/24/2011    Years since quitting: 7.5  . Smokeless tobacco: Former Systems developer    Types: [place]  date: 03/07/1983  Substance and Sexual Activity  . Alcohol use: Yes    Comment: 06/08/11 "no liquor for 2 1/2 years; last beer 05/31/11"  . Drug use: No  . Sexual activity: Yes  Lifestyle  . Physical activity    Days per week: Not on file    Minutes per session: Not on file  . Stress: Not on file  Relationships  . Social Herbalist on phone: Not on file    Gets together: Not on file    Attends religious service: Not on file    Active member of club or organization: Not on file    Attends meetings of clubs or organizations: Not on file    Relationship status: Not on file  . Intimate partner violence    Fear of current or ex partner: Not on file    Emotionally abused: Not on file    Physically abused: Not on file    Forced sexual activity: Not on file  Other Topics Concern  . Not on file  Social History Narrative   Married    FAMILY HISTORY: Family History  Problem Relation Age of Onset  . Prostate cancer Father   . Hypertension Father   . Other Mother        VARICOSE VEINS    ALLERGIES:  is allergic to penicillins.  MEDICATIONS:  Current Outpatient Medications  Medication Sig Dispense Refill  . allopurinol (ZYLOPRIM) 300 MG tablet Take 1 tablet by mouth twice daily 180 tablet 0  . amLODipine (NORVASC) 5 MG tablet Take 1 tablet (5 mg total) by mouth daily. 30 tablet 0  . apixaban (ELIQUIS) 5 MG TABS tablet Take 1 tablet (5 mg total) by mouth 2 (two) times daily. 180 tablet 1  . Ascorbic Acid (VITAMIN C) 100 MG tablet Take 100 mg by mouth daily.    Marland Kitchen ECHINACEA PO Take 760 mg by mouth daily.    . fluticasone (FLONASE) 50 MCG/ACT nasal spray Place 2 sprays into both nostrils daily as needed for allergies or rhinitis.    . Ginseng 100 MG CAPS Take 100 mg by mouth daily.    . IMBRUVICA 420 MG TABS TAKE 1 TABLET (420 MG) BY MOUTH DAILY. 28 tablet 3  . JANUVIA 100 MG tablet Take 100 mg by mouth daily.  3  . lisinopril (PRINIVIL,ZESTRIL) 40 MG tablet Take 40 mg by  mouth daily.    . metFORMIN (GLUCOPHAGE) 500 MG tablet Take 1,000 mg by mouth 2 (two) times daily.    . mupirocin ointment (BACTROBAN) 2 % APPLY WITH Q TIP THREE TIMES DAILY AS NEEDED FOR DRYNESS AND CRUSTING  3  . Omega-3 Fatty Acids (OMEGA 3 500) 500 MG CAPS Take 500 mg by mouth daily.    . ondansetron (ZOFRAN) 4 MG tablet TAKE 1 TABLET BY MOUTH EVERY 8 HOURS AS NEEDED FOR NAUSEA  FOR VOMITING 20 tablet 2  . ONE TOUCH ULTRA TEST test strip USE 1 STRIP TO CHECK GLUCOSE ONCE A WEEK  99  . pravastatin (PRAVACHOL) 40 MG tablet Take 40 mg by mouth daily.    . Saw Palmetto 450 MG CAPS Take 450 mg by mouth daily.     No current facility-administered medications for this visit.       Marland Kitchen  PHYSICAL EXAMINATION: ECOG PERFORMANCE STATUS: 1 - Symptomatic but completely ambulatory  Vitals:   12/18/18 1451  BP: (!) 167/77  Pulse: 92  Resp: 16  Temp: 99.3 F (37.4 C)   Filed Weights   12/18/18 1451  Weight: 200 lb 12.8 oz (91.1 kg)    Physical Exam  Constitutional: He is oriented to person, place, and time and well-developed, well-nourished, and in no distress.  Alone.  HENT:  Head: Normocephalic and atraumatic.  Mouth/Throat: Oropharynx is clear and moist. No oropharyngeal exudate.  Eyes: Pupils are equal, round, and reactive to light.  Neck: Normal range of motion. Neck supple.  Cardiovascular: Normal rate and regular rhythm.  Pulmonary/Chest: No respiratory distress. He has no wheezes.  Abdominal: Soft. Bowel sounds are normal. He exhibits no distension and no mass. There is no abdominal tenderness. There is no rebound and no guarding.  Musculoskeletal: Normal range of motion.        General: No tenderness or edema.  Lymphadenopathy:  Significant improvement of the lymphadenopathy in the neck/resolved; scattered approximately 1 cm few lymph nodes present in the right left underarm.  Neurological: He is alert and oriented to person, place, and time.  Skin: Skin is warm.   Psychiatric: Affect normal.     LABORATORY DATA:  I have reviewed the data as listed Lab Results  Component Value Date   WBC 14.9 (H) 12/18/2018   HGB 10.5 (L) 12/18/2018   HCT 34.2 (L) 12/18/2018   MCV 80.9 12/18/2018   PLT 334 12/18/2018   Recent Labs    12/26/17 1000  09/24/18 1335 10/22/18 1339 11/20/18 1401  NA  --    < > 136 138 137  K  --    < > 4.1 4.2 4.1  CL  --    < > 102 102 101  CO2  --    < > '25 26 28  ' GLUCOSE  --    < > 146* 164* 212*  BUN  --    < > 25* 23 23  CREATININE  --    < > 1.35* 1.11 1.27*  CALCIUM  --    < > 8.6* 9.3 9.3  GFRNONAA  --    < > 54* >60 58*  GFRAA  --    < > >60 >60 >60  PROT 6.7   < > 6.4* 7.0 6.6  ALBUMIN 4.2   < > 3.1* 3.5 3.5  AST 17   < > 14* 16 13*  ALT 14   < > '13 15 12  ' ALKPHOS 63   < > 46 62 65  BILITOT 0.6   < > 0.5 0.4 0.4  BILIDIR 0.1  --   --   --   --   IBILI 0.5  --   --   --   --    < > = values in this interval not displayed.    RADIOGRAPHIC STUDIES: I have personally reviewed the radiological images as listed and agreed with the findings in the report. No results found.  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  IGVH mutated. On Ibrutinib 420 mg/day; CT AUG 2020- CT C/A/P- STABLE disease; except slightly increase in Left Supraclavicular LN; mild increase in RP LN [by appx 1cm].   # Continue Imbruvica at this time; tolerating fairly well. hemoglobin today 10.5. White count-11; platelets 320.  We will plan repeat imaging November. Discussed that if continued progression of disease noted would recommend Gazyva plus venetoclax.  # Mild anemia-hemoglobin 10.7.? CKD; iron sat-5%; recommend Vitron C a day.   # HTN -again reviewed blood pressure at home 130-s-140s sec to  Ibrutinib; continue lisinopril 40 mg a day/  norvasc 5 mg/day.  Stable.   # Bilateral PE On eliquis 5 mg BID-stable  # I spoke at length with the  patient's family, Hoyle Sauer- regarding the patient's clinical status/plan of care.  Family agreement.   DISPOSITION:  # follow up ~23rd NOV MD- labs- cbc/cmp/ldh-CT/C/A/P prior- Dr.B  Cc; Dr.Vaught.   All questions were answered. The patient knows to call the clinic with any problems, questions or concerns.     Cammie Sickle, MD 12/18/2018 3:57 PM

## 2018-12-19 ENCOUNTER — Other Ambulatory Visit: Payer: Self-pay | Admitting: *Deleted

## 2018-12-19 MED ORDER — ALLOPURINOL 300 MG PO TABS: 300.0000 mg | ORAL_TABLET | Freq: Two times a day (BID) | ORAL | 0 refills | Status: DC

## 2018-12-26 ENCOUNTER — Ambulatory Visit: Payer: Medicare Other

## 2019-01-01 MED FILL — IMBRUVICA 420 MG TAB: 420 | 28 days supply | Qty: 28 | Fill #1

## 2019-01-09 ENCOUNTER — Other Ambulatory Visit: Payer: Self-pay | Admitting: *Deleted

## 2019-01-10 MED ORDER — AMLODIPINE BESYLATE 5 MG PO TABS: 5.0000 mg | ORAL_TABLET | Freq: Every day | ORAL | 0 refills | Status: DC

## 2019-01-24 ENCOUNTER — Ambulatory Visit
Admission: RE | Admit: 2019-01-24 | Discharge: 2019-01-24 | Disposition: A | Discharge status: Home / Self Care | Payer: Medicare Other | Source: Ambulatory Visit | Attending: Internal Medicine | Admitting: Internal Medicine

## 2019-01-24 ENCOUNTER — Other Ambulatory Visit: Payer: Self-pay

## 2019-01-24 DIAGNOSIS — C8308 Small cell B-cell lymphoma, lymph nodes of multiple sites: Secondary | ICD-10-CM | POA: Diagnosis present

## 2019-01-27 ENCOUNTER — Inpatient Hospital Stay: Payer: Medicare Other

## 2019-01-27 ENCOUNTER — Other Ambulatory Visit: Payer: Self-pay | Admitting: *Deleted

## 2019-01-27 ENCOUNTER — Inpatient Hospital Stay: Payer: Medicare Other | Attending: Internal Medicine | Admitting: Internal Medicine

## 2019-01-27 ENCOUNTER — Encounter: Payer: Self-pay | Admitting: Internal Medicine

## 2019-01-27 ENCOUNTER — Other Ambulatory Visit: Payer: Self-pay

## 2019-01-27 DIAGNOSIS — E1122 Type 2 diabetes mellitus with diabetic chronic kidney disease: Secondary | ICD-10-CM | POA: Insufficient documentation

## 2019-01-27 DIAGNOSIS — I129 Hypertensive chronic kidney disease with stage 1 through stage 4 chronic kidney disease, or unspecified chronic kidney disease: Secondary | ICD-10-CM | POA: Insufficient documentation

## 2019-01-27 DIAGNOSIS — C8308 Small cell B-cell lymphoma, lymph nodes of multiple sites: Secondary | ICD-10-CM | POA: Insufficient documentation

## 2019-01-27 DIAGNOSIS — N183 Chronic kidney disease, stage 3 unspecified: Secondary | ICD-10-CM | POA: Insufficient documentation

## 2019-01-27 DIAGNOSIS — I2699 Other pulmonary embolism without acute cor pulmonale: Secondary | ICD-10-CM | POA: Diagnosis not present

## 2019-01-27 DIAGNOSIS — D649 Anemia, unspecified: Secondary | ICD-10-CM | POA: Insufficient documentation

## 2019-01-27 DIAGNOSIS — Z7901 Long term (current) use of anticoagulants: Secondary | ICD-10-CM | POA: Diagnosis not present

## 2019-01-27 DIAGNOSIS — Z7984 Long term (current) use of oral hypoglycemic drugs: Secondary | ICD-10-CM | POA: Insufficient documentation

## 2019-01-27 LAB — COMPREHENSIVE METABOLIC PANEL
ALT: 13 U/L (ref 0–44)
AST: 15 U/L (ref 15–41)
Albumin: 3.4 g/dL — ABNORMAL LOW (ref 3.5–5.0)
Alkaline Phosphatase: 53 U/L (ref 38–126)
Anion gap: 7 (ref 5–15)
BUN: 27 mg/dL — ABNORMAL HIGH (ref 8–23)
CO2: 25 mmol/L (ref 22–32)
Calcium: 9.1 mg/dL (ref 8.9–10.3)
Chloride: 104 mmol/L (ref 98–111)
Creatinine, Ser: 1.08 mg/dL (ref 0.61–1.24)
GFR calc Af Amer: >60 mL/min (ref >60)
GFR calc non Af Amer: >60 mL/min (ref >60)
Glucose, Bld: 131 mg/dL — ABNORMAL HIGH (ref 70–99)
Potassium: 4.5 mmol/L (ref 3.5–5.1)
Sodium: 136 mmol/L (ref 135–145)
Total Bilirubin: 0.4 mg/dL (ref 0.3–1.2)
Total Protein: 6.5 g/dL (ref 6.5–8.1)

## 2019-01-27 LAB — CBC WITH DIFFERENTIAL/PLATELET
Abs Immature Granulocytes: 0.32 10*3/uL — ABNORMAL HIGH (ref 0.00–0.07)
Basophils Absolute: 0.1 10*3/uL (ref 0.0–0.1)
Basophils Relative: 1 %
Eosinophils Absolute: 0.7 10*3/uL — ABNORMAL HIGH (ref 0.0–0.5)
Eosinophils Relative: 5 %
HCT: 31.7 % — ABNORMAL LOW (ref 39.0–52.0)
Hemoglobin: 9.4 g/dL — ABNORMAL LOW (ref 13.0–17.0)
Immature Granulocytes: 2 %
Lymphocytes Relative: 20 %
Lymphs Abs: 2.8 10*3/uL (ref 0.7–4.0)
MCH: 24.3 pg — ABNORMAL LOW (ref 26.0–34.0)
MCHC: 29.7 g/dL — ABNORMAL LOW (ref 30.0–36.0)
MCV: 81.9 fL (ref 80.0–100.0)
Monocytes Absolute: 1.1 10*3/uL — ABNORMAL HIGH (ref 0.1–1.0)
Monocytes Relative: 8 %
Neutro Abs: 8.8 10*3/uL — ABNORMAL HIGH (ref 1.7–7.7)
Neutrophils Relative %: 64 %
Platelets: 463 10*3/uL — ABNORMAL HIGH (ref 150–400)
RBC: 3.87 MIL/uL — ABNORMAL LOW (ref 4.22–5.81)
RDW: 16.6 % — ABNORMAL HIGH (ref 11.5–15.5)
WBC: 13.7 10*3/uL — ABNORMAL HIGH (ref 4.0–10.5)
nRBC: 0 % (ref 0.0–0.2)

## 2019-01-27 LAB — IRON AND TIBC
Iron: 83 ug/dL (ref 45–182)
Saturation Ratios: 23 % (ref 17.9–39.5)
TIBC: 361 ug/dL (ref 250–450)
UIBC: 278 ug/dL

## 2019-01-27 LAB — LACTATE DEHYDROGENASE: LDH: 150 U/L (ref 98–192)

## 2019-01-27 LAB — FERRITIN: Ferritin: 19 ng/mL — ABNORMAL LOW (ref 24–336)

## 2019-01-27 NOTE — Progress Notes (Signed)
[facility] NOTE  Patient Care Team: [name], MD as PCP - General (Internal Medicine)  CHIEF COMPLAINTS/PURPOSE OF CONSULTATION: SMALL CELL LYMPHOMA/CLL  #  Oncology History Overview Note  # October 2019- SMALL CELL LYMPHOMA; ki-67-10%. STAGE- III/ IV; PET scan- bulky LN [Dr.Vaught] above/below Diaphragm; No spleen/liver/bone;   # 22nd October 2019- Bil PE on lovenox xstopped; NOV 2019- Eliquis 5 mg BID. AUG 2020-slight incresae in RP LN ~1cm; continue Ibrutinib  # DM/HTN/ CKD- stage III -----------------------------------------------   MOLECULAR TESTING: Trisomy 12. 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  Patient wife available at the appointment today.  [name] 68 y.o.  male above diagnosis of of SLL/CLL on ibrutinib; incidental bilateral PE is here for follow-up.   Patient has mild to moderate fatigue.  Otherwise denies any shortness of breath on exertion.  Continues have intermittent easy bruising.  Otherwise no spontaneous bleeding.  No falls.  Blood pressures have been in the range of 130s to 140s at home.  He seemed to have a callus of his left lower extremity/at the site of prosthesis.  Improved.  Review of Systems  Constitutional: Positive for malaise/fatigue. Negative for chills, diaphoresis, fever and weight loss.  HENT: Negative for sore throat.   Eyes: Negative for double vision.  Respiratory: Negative for cough, hemoptysis, sputum production, shortness of breath and wheezing.   Cardiovascular: Negative for chest pain, palpitations, orthopnea  and leg swelling.  Gastrointestinal: Negative for abdominal pain, blood in stool, constipation, diarrhea, heartburn and melena.  Genitourinary: Negative for dysuria, frequency and urgency.  Musculoskeletal: Positive for back pain and joint pain.  Skin: Negative.  Negative for itching and rash.  Neurological: Negative for dizziness, tingling, focal weakness, weakness and headaches.  Endo/Heme/Allergies: Does not bruise/bleed easily.  Psychiatric/Behavioral: Negative for depression. The patient is not nervous/anxious and does not have insomnia.      MEDICAL HISTORY:  Past Medical History:  Diagnosis Date  . Anxiety   . Arthritis   . 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 HOH (hard of hearing)   . Hypertension   . 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 Seasonal allergies   . Type II diabetes mellitus ([place])    diet controlled    SURGICAL HISTORY: Past Surgical History:  Procedure Laterality Date  . AMPUTATION  06/08/2011   Procedure: AMPUTATION RAY;  Surgeon: Wylene Simmer, MD;  Location: [facility];  Service: Orthopedics;  Laterality: Right;  [place] Amputation  . [place]   "crushed"  . FRACTURE SURGERY    . 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 PILONIDAL CYST / SINUS EXCISION  1970's  . TOE AMPUTATION  06/08/11   partial; right great toe    SOCIAL HISTORY: Social History   Socioeconomic History  . Marital status: Married    Spouse name: Not on file  . Number of children: Not on file  . Years of education: Not on file  . Highest education  level: Not on file  Occupational History  . Not on file  Social Needs  . Financial resource strain: Not on file  . Food insecurity    Worry: Not on file    Inability: Not on file  . Transportation needs    Medical: Not on file    Non-medical: Not on file  Tobacco Use  . Smoking status: Former Smoker    Packs/day: 1.50    Years: 22.00    Pack years: 33.00    Types: Cigarettes    Quit  date: 05/24/2011    Years since quitting: 7.6  . Smokeless tobacco: Former Systems developer    Types: [place] date: 03/07/1983  Substance and Sexual Activity  . Alcohol use: Yes    Comment: 06/08/11 "no liquor for 2 1/2 years; last beer 05/31/11"  . Drug use: No  . Sexual activity: Yes  Lifestyle  . Physical activity    Days per week: Not on file    Minutes per session: Not on file  . Stress: Not on file  Relationships  . Social Herbalist on phone: Not on file    Gets together: Not on file    Attends religious service: Not on file    Active member of club or organization: Not on file    Attends meetings of clubs or organizations: Not on file    Relationship status: Not on file  . Intimate partner violence    Fear of current or ex partner: Not on file    Emotionally abused: Not on file    Physically abused: Not on file    Forced sexual activity: Not on file  Other Topics Concern  . Not on file  Social History Narrative   Married    FAMILY HISTORY: Family History  Problem Relation Age of Onset  . Prostate cancer Father   . Hypertension Father   . Other Mother        VARICOSE VEINS    ALLERGIES:  is allergic to penicillins.  MEDICATIONS:  Current Outpatient Medications  Medication Sig Dispense Refill  . allopurinol (ZYLOPRIM) 300 MG tablet Take 1 tablet (300 mg total) by mouth 2 (two) times daily. 180 tablet 0  . amLODipine (NORVASC) 5 MG tablet Take 1 tablet (5 mg total) by mouth daily. 90 tablet 0  . apixaban (ELIQUIS) 5 MG TABS tablet Take 1 tablet (5 mg total) by mouth 2 (two) times daily. 180 tablet 1  . Ascorbic Acid (VITAMIN C) 100 MG tablet Take 100 mg by mouth daily.    Marland Kitchen ECHINACEA PO Take 760 mg by mouth daily.    . fluticasone (FLONASE) 50 MCG/ACT nasal spray Place 2 sprays into both nostrils daily as needed for allergies or rhinitis.    . Ginseng 100 MG CAPS Take 100 mg by mouth daily.    . IMBRUVICA 420 MG TABS TAKE 1 TABLET (420 MG) BY MOUTH DAILY. 28  tablet 3  . JANUVIA 100 MG tablet Take 100 mg by mouth daily.  3  . lisinopril (PRINIVIL,ZESTRIL) 40 MG tablet Take 40 mg by mouth daily.    . metFORMIN (GLUCOPHAGE) 500 MG tablet Take 1,000 mg by mouth 2 (two) times daily.    . mupirocin ointment (BACTROBAN) 2 % APPLY WITH Q TIP THREE TIMES DAILY AS NEEDED FOR DRYNESS AND CRUSTING  3  . Omega-3 Fatty Acids (OMEGA 3 500) 500 MG CAPS Take 500 mg by mouth daily.    Marland Kitchen  ondansetron (ZOFRAN) 4 MG tablet TAKE 1 TABLET BY MOUTH EVERY 8 HOURS AS NEEDED FOR NAUSEA FOR VOMITING 20 tablet 2  . ONE TOUCH ULTRA TEST test strip USE 1 STRIP TO CHECK GLUCOSE ONCE A WEEK  99  . pravastatin (PRAVACHOL) 40 MG tablet Take 40 mg by mouth daily.    . Saw Palmetto 450 MG CAPS Take 450 mg by mouth daily.     No current facility-administered medications for this visit.       Marland Kitchen  PHYSICAL EXAMINATION: ECOG PERFORMANCE STATUS: 1 - Symptomatic but completely ambulatory  Vitals:   01/27/19 1453 01/27/19 1458  BP: (!) 193/77 (!) 165/60  Pulse: 95 89  Resp: 16   Temp: 98.3 F (36.8 C)   SpO2: 97%    Filed Weights   01/27/19 1453  Weight: 199 lb (90.3 kg)    Physical Exam  Constitutional: He is oriented to person, place, and time and well-developed, well-nourished, and in no distress.  Alone.  HENT:  Head: Normocephalic and atraumatic.  Mouth/Throat: Oropharynx is clear and moist. No oropharyngeal exudate.  Eyes: Pupils are equal, round, and reactive to light.  Neck: Normal range of motion. Neck supple.  Cardiovascular: Normal rate and regular rhythm.  Pulmonary/Chest: No respiratory distress. He has no wheezes.  Abdominal: Soft. Bowel sounds are normal. He exhibits no distension and no mass. There is no abdominal tenderness. There is no rebound and no guarding.  Musculoskeletal: Normal range of motion.        General: No tenderness or edema.  Lymphadenopathy:  Significant improvement of the lymphadenopathy in the neck/resolved; scattered approximately  1 cm few lymph nodes present in the right left underarm.  Neurological: He is alert and oriented to person, place, and time.  Skin: Skin is warm.  Psychiatric: Affect normal.     LABORATORY DATA:  I have reviewed the data as listed Lab Results  Component Value Date   WBC 13.7 (H) 01/27/2019   HGB 9.4 (L) 01/27/2019   HCT 31.7 (L) 01/27/2019   MCV 81.9 01/27/2019   PLT 463 (H) 01/27/2019   Recent Labs    11/20/18 1401 12/18/18 1408 01/27/19 1432  NA 137 138 136  K 4.1 4.2 4.5  CL 101 101 104  CO2 '28 28 25  ' GLUCOSE 212* 171* 131*  BUN 23 26* 27*  CREATININE 1.27* 1.23 1.08  CALCIUM 9.3 10.0 9.1  GFRNONAA 58* >60 >60  GFRAA >60 >60 >60  PROT 6.6 6.6 6.5  ALBUMIN 3.5 3.5 3.4*  AST 13* 14* 15  ALT '12 13 13  ' ALKPHOS 65 60 53  BILITOT 0.4 0.3 0.4    RADIOGRAPHIC STUDIES: I have personally reviewed the radiological images as listed and agreed with the findings in the report. Ct Abdomen Pelvis Wo Contrast  Result Date: 01/24/2019 CLINICAL DATA:  Lymphoma, on chemotherapy pill. EXAM: CT CHEST, ABDOMEN AND PELVIS WITHOUT CONTRAST TECHNIQUE: Multidetector CT imaging of the chest, abdomen and pelvis was performed following the standard protocol without IV contrast. COMPARISON:  10/17/2018 and 12/25/2017. FINDINGS: CT CHEST FINDINGS Cardiovascular: Atherosclerotic calcification of the aorta and coronary arteries. Heart is mildly enlarged. No pericardial effusion. Mediastinum/Nodes: Low internal jugular lymph nodes measure up to 11 mm on the left, low internal jugular lymph nodes measure up to 1.4 cm on the left (2/8), stable. No pathologically enlarged mediastinal lymph nodes. Hilar regions are difficult to evaluate without IV contrast. There are numerous subpectoral and axillary lymph nodes bilaterally, measuring up to 1.3  cm on the left, stable. Esophagus is mildly dilated which can be seen with dysmotility. Prepericardiac lymph nodes are subcentimeter in short axis size.  Lungs/Pleura: Image quality is degraded by respiratory motion, especially in the lung bases. Subpleural nodules measure up to 6 mm in the right middle lobe, as on 12/25/2017, likely representing subpleural lymph nodes. No pleural fluid. Airway is unremarkable. Musculoskeletal: Extensive degenerative change in the spine. No worrisome lytic or sclerotic lesions. CT ABDOMEN PELVIS FINDINGS Hepatobiliary: Liver and gallbladder are unremarkable. No biliary ductal dilatation. Pancreas: Negative. Spleen: Negative. Adrenals/Urinary Tract: There may be nodular thickening of the medial limb right adrenal gland. Left adrenal gland is unremarkable. 1.5 cm lesion off the upper pole right kidney measures 35 Hounsfield units. Further characterization is limited due to size and lack of postcontrast imaging. Kidneys are otherwise unremarkable. Ureters are decompressed. Bladder is indented by the prostate. Bladder is otherwise unremarkable. Stomach/Bowel: Stomach, small bowel and appendix are unremarkable. Fair amount of stool is seen in the colon, indicative of constipation. Vascular/Lymphatic: Atherosclerotic calcification of the aorta without aneurysm. Extensive retroperitoneal adenopathy. Largest nodal mass is in the low left periaortic station, measuring 5.7 x 6.2 cm (2/97), stable. Some lymph nodes do appear decreased in size, however. For example, a left periaortic lymph node measures 2.4 x 3.6 cm (2/87), compared to 3.4 x 4.6 cm on 10/17/2018. Pelvic retroperitoneal lymph nodes measure up to 3.2 cm in short axis in the left external iliac station (2/117), stable. A right external iliac lymph node measuring 1.4 cm (2/123) appears minimally improved from 1.8 cm. Reproductive: Prostate is enlarged. Other: No free fluid. Mesenteric lymph nodes are not enlarged by CT size criteria. Mild laxity of the ventral abdominal wall in the midline. Musculoskeletal: Degenerative changes in the spine. IMPRESSION: 1. Stable thoracic  adenopathy. Bulky abdominal and pelvic retroperitoneal adenopathy has improved minimally in the interval. 2. Enlarged prostate. 3. Aortic atherosclerosis (ICD10-170.0). Coronary artery calcification. Electronically Signed   By: Lorin Picket M.D.   On: 01/24/2019 13:42   Ct Chest Wo Contrast  Result Date: 01/24/2019 CLINICAL DATA:  Lymphoma, on chemotherapy pill. EXAM: CT CHEST, ABDOMEN AND PELVIS WITHOUT CONTRAST TECHNIQUE: Multidetector CT imaging of the chest, abdomen and pelvis was performed following the standard protocol without IV contrast. COMPARISON:  10/17/2018 and 12/25/2017. FINDINGS: CT CHEST FINDINGS Cardiovascular: Atherosclerotic calcification of the aorta and coronary arteries. Heart is mildly enlarged. No pericardial effusion. Mediastinum/Nodes: Low internal jugular lymph nodes measure up to 11 mm on the left, low internal jugular lymph nodes measure up to 1.4 cm on the left (2/8), stable. No pathologically enlarged mediastinal lymph nodes. Hilar regions are difficult to evaluate without IV contrast. There are numerous subpectoral and axillary lymph nodes bilaterally, measuring up to 1.3 cm on the left, stable. Esophagus is mildly dilated which can be seen with dysmotility. Prepericardiac lymph nodes are subcentimeter in short axis size. Lungs/Pleura: Image quality is degraded by respiratory motion, especially in the lung bases. Subpleural nodules measure up to 6 mm in the right middle lobe, as on 12/25/2017, likely representing subpleural lymph nodes. No pleural fluid. Airway is unremarkable. Musculoskeletal: Extensive degenerative change in the spine. No worrisome lytic or sclerotic lesions. CT ABDOMEN PELVIS FINDINGS Hepatobiliary: Liver and gallbladder are unremarkable. No biliary ductal dilatation. Pancreas: Negative. Spleen: Negative. Adrenals/Urinary Tract: There may be nodular thickening of the medial limb right adrenal gland. Left adrenal gland is unremarkable. 1.5 cm lesion off the  upper pole right kidney measures 35 Hounsfield units. Further  characterization is limited due to size and lack of postcontrast imaging. Kidneys are otherwise unremarkable. Ureters are decompressed. Bladder is indented by the prostate. Bladder is otherwise unremarkable. Stomach/Bowel: Stomach, small bowel and appendix are unremarkable. Fair amount of stool is seen in the colon, indicative of constipation. Vascular/Lymphatic: Atherosclerotic calcification of the aorta without aneurysm. Extensive retroperitoneal adenopathy. Largest nodal mass is in the low left periaortic station, measuring 5.7 x 6.2 cm (2/97), stable. Some lymph nodes do appear decreased in size, however. For example, a left periaortic lymph node measures 2.4 x 3.6 cm (2/87), compared to 3.4 x 4.6 cm on 10/17/2018. Pelvic retroperitoneal lymph nodes measure up to 3.2 cm in short axis in the left external iliac station (2/117), stable. A right external iliac lymph node measuring 1.4 cm (2/123) appears minimally improved from 1.8 cm. Reproductive: Prostate is enlarged. Other: No free fluid. Mesenteric lymph nodes are not enlarged by CT size criteria. Mild laxity of the ventral abdominal wall in the midline. Musculoskeletal: Degenerative changes in the spine. IMPRESSION: 1. Stable thoracic adenopathy. Bulky abdominal and pelvic retroperitoneal adenopathy has improved minimally in the interval. 2. Enlarged prostate. 3. Aortic atherosclerosis (ICD10-170.0). Coronary artery calcification. Electronically Signed   By: Lorin Picket M.D.   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  IGVH mutated. On Ibrutinib 420 mg/day; CT nov 2020- CT C/A/P- STABLE disease  # Continue Imbruvica at this time; tolerating fairly well; except  hemoglobin today 9.5 . White count-12; platelets 464.   # Mild-moderate anemia-hemoglobin 9.5; worse? Etiology-  continue  Vitron C a day. Check iron studies today.  If significantly worse iron studies would recommend further GI work-up/IV iron infusion.  # HTN -again reviewed blood pressure at home 130-s-140s sec to  Ibrutinib; continue lisinopril 40 mg a day/  norvasc 5 mg/day.  Stable.   # Bilateral PE On eliquis 5 mg BID- STABLE.   # I spoke at length with the patient's family, Hoyle Sauer- regarding the patient's clinical status/plan of care.   DISPOSITION: add Iron studies/ferrtin today # follow up in 1 month- MD; labs- cbc/cmp/LDH; b12/folate- Dr.B    All questions were answered. The patient knows to call the clinic with any problems, questions or concerns.     Cammie Sickle, MD 01/27/2019 3:47 PM

## 2019-01-27 NOTE — Assessment & Plan Note (Addendum)
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 On Ibrutinib 420 mg/day; CT nov 2020- CT C/A/P- STABLE disease  # Continue Imbruvica at this time; tolerating fairly well; except  hemoglobin today 9.5 . White count-12; platelets 464.   # Mild-moderate anemia-hemoglobin 9.5; worse? Etiology- continue  Vitron C a day. Check iron studies today.  If significantly worse iron studies would recommend further GI work-up/IV iron infusion.  # HTN -again reviewed blood pressure at home 130-s-140s sec to  Ibrutinib; continue lisinopril 40 mg a day/  norvasc 5 mg/day.  Stable.   # Bilateral PE On eliquis 5 mg BID- STABLE.   # I spoke at length with the patient's family, Hoyle Sauer- regarding the patient's clinical status/plan of care.   DISPOSITION: add Iron studies/ferrtin today # follow up in 1 month- MD; labs- cbc/cmp/LDH; b12/folate- Dr.B

## 2019-01-28 MED FILL — IMBRUVICA 420 MG TAB: 420 | 28 days supply | Qty: 28 | Fill #2

## 2019-02-24 ENCOUNTER — Inpatient Hospital Stay: Payer: Medicare Other

## 2019-02-24 ENCOUNTER — Other Ambulatory Visit: Payer: Self-pay

## 2019-02-24 ENCOUNTER — Inpatient Hospital Stay: Payer: Medicare Other | Attending: Internal Medicine | Admitting: Internal Medicine

## 2019-02-24 DIAGNOSIS — C8308 Small cell B-cell lymphoma, lymph nodes of multiple sites: Secondary | ICD-10-CM | POA: Diagnosis present

## 2019-02-24 DIAGNOSIS — I2699 Other pulmonary embolism without acute cor pulmonale: Secondary | ICD-10-CM | POA: Diagnosis not present

## 2019-02-24 DIAGNOSIS — Z87891 Personal history of nicotine dependence: Secondary | ICD-10-CM | POA: Diagnosis not present

## 2019-02-24 DIAGNOSIS — R5383 Other fatigue: Secondary | ICD-10-CM | POA: Insufficient documentation

## 2019-02-24 DIAGNOSIS — I129 Hypertensive chronic kidney disease with stage 1 through stage 4 chronic kidney disease, or unspecified chronic kidney disease: Secondary | ICD-10-CM | POA: Insufficient documentation

## 2019-02-24 DIAGNOSIS — Z7901 Long term (current) use of anticoagulants: Secondary | ICD-10-CM | POA: Insufficient documentation

## 2019-02-24 DIAGNOSIS — Z7984 Long term (current) use of oral hypoglycemic drugs: Secondary | ICD-10-CM | POA: Insufficient documentation

## 2019-02-24 DIAGNOSIS — H9190 Unspecified hearing loss, unspecified ear: Secondary | ICD-10-CM | POA: Insufficient documentation

## 2019-02-24 DIAGNOSIS — D509 Iron deficiency anemia, unspecified: Secondary | ICD-10-CM | POA: Diagnosis not present

## 2019-02-24 DIAGNOSIS — D500 Iron deficiency anemia secondary to blood loss (chronic): Secondary | ICD-10-CM | POA: Insufficient documentation

## 2019-02-24 DIAGNOSIS — E1122 Type 2 diabetes mellitus with diabetic chronic kidney disease: Secondary | ICD-10-CM | POA: Diagnosis not present

## 2019-02-24 DIAGNOSIS — N183 Chronic kidney disease, stage 3 unspecified: Secondary | ICD-10-CM | POA: Diagnosis not present

## 2019-02-24 LAB — CBC WITH DIFFERENTIAL/PLATELET
Abs Immature Granulocytes: 0.07 10*3/uL (ref 0.00–0.07)
Basophils Absolute: 0.1 10*3/uL (ref 0.0–0.1)
Basophils Relative: 1 %
Eosinophils Absolute: 0.1 10*3/uL (ref 0.0–0.5)
Eosinophils Relative: 1 %
HCT: 31.8 % — ABNORMAL LOW (ref 39.0–52.0)
Hemoglobin: 9.2 g/dL — ABNORMAL LOW (ref 13.0–17.0)
Immature Granulocytes: 1 %
Lymphocytes Relative: 17 %
Lymphs Abs: 1.7 10*3/uL (ref 0.7–4.0)
MCH: 23.4 pg — ABNORMAL LOW (ref 26.0–34.0)
MCHC: 28.9 g/dL — ABNORMAL LOW (ref 30.0–36.0)
MCV: 80.9 fL (ref 80.0–100.0)
Monocytes Absolute: 0.9 10*3/uL (ref 0.1–1.0)
Monocytes Relative: 9 %
Neutro Abs: 7.1 10*3/uL (ref 1.7–7.7)
Neutrophils Relative %: 71 %
Platelets: 376 10*3/uL (ref 150–400)
RBC: 3.93 MIL/uL — ABNORMAL LOW (ref 4.22–5.81)
RDW: 16.2 % — ABNORMAL HIGH (ref 11.5–15.5)
Smear Review: NORMAL
WBC: 9.8 10*3/uL (ref 4.0–10.5)
nRBC: 0 % (ref 0.0–0.2)

## 2019-02-24 LAB — COMPREHENSIVE METABOLIC PANEL
ALT: 9 U/L (ref 0–44)
AST: 12 U/L — ABNORMAL LOW (ref 15–41)
Albumin: 3.4 g/dL — ABNORMAL LOW (ref 3.5–5.0)
Alkaline Phosphatase: 58 U/L (ref 38–126)
Anion gap: 7 (ref 5–15)
BUN: 30 mg/dL — ABNORMAL HIGH (ref 8–23)
CO2: 28 mmol/L (ref 22–32)
Calcium: 9.6 mg/dL (ref 8.9–10.3)
Chloride: 102 mmol/L (ref 98–111)
Creatinine, Ser: 1.44 mg/dL — ABNORMAL HIGH (ref 0.61–1.24)
GFR calc Af Amer: 58 mL/min — ABNORMAL LOW (ref >60)
GFR calc non Af Amer: 50 mL/min — ABNORMAL LOW (ref >60)
Glucose, Bld: 169 mg/dL — ABNORMAL HIGH (ref 70–99)
Potassium: 4 mmol/L (ref 3.5–5.1)
Sodium: 137 mmol/L (ref 135–145)
Total Bilirubin: 0.4 mg/dL (ref 0.3–1.2)
Total Protein: 6.6 g/dL (ref 6.5–8.1)

## 2019-02-24 LAB — FOLATE: Folate: 11.9 ng/mL (ref >5.9)

## 2019-02-24 LAB — VITAMIN B12: Vitamin B-12: 160 pg/mL — ABNORMAL LOW (ref 180–914)

## 2019-02-24 LAB — LACTATE DEHYDROGENASE: LDH: 137 U/L (ref 98–192)

## 2019-02-24 MED ORDER — IMBRUVICA 420 MG PO TABS: 420.0000 mg | ORAL_TABLET | Freq: Every day | ORAL | 3 refills | Status: DC

## 2019-02-24 NOTE — Assessment & Plan Note (Addendum)
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 On Ibrutinib 420 mg/day; CT nov 2020- CT C/A/P- STABLE ~4 cm/bulky retroperitoneal adenopathy.   # Continue Imbruvica at this time; tolerating fairly well; except  hemoglobin today 9.2/see below. White count-12; platelets 464.   # Mild-moderate anemia-hemoglobin 9.2; worse? Etiology- continue  Vitron C a day; iron studies studies of iron deficiency. Recommend GI evaluation- Upper GI/colonoscopy.  Check stool occult x3 pt will call and let us know re: referal. relutant with IV iron; possible IV iron at next visit.   # HTN -again reviewed blood pressure at home 130-s-140s sec to  Ibrutinib; continue lisinopril 40 mg a day/  norvasc 5 mg/day.  Stable.   # Bilateral PE On eliquis 5 mg BID- STABLE; will have to come off eliquis prior to invasive GI procedures.   # I spoke at length with the patient's family, Hoyle Sauer- regarding the patient's clinical status/plan of care.   DISPOSITION: # stool occult x3- will decide on GI  # follow up in 1 month- MD; labs- cbc/cmp; possible Venofer; Dr.B

## 2019-02-24 NOTE — Progress Notes (Signed)
[facility] NOTE  Patient Care Team: [name], MD as PCP - General (Internal Medicine)  CHIEF COMPLAINTS/PURPOSE OF CONSULTATION: SMALL CELL LYMPHOMA/CLL  #  Oncology History Overview Note  # October 2019- SMALL CELL LYMPHOMA; ki-67-10%. STAGE- III/ IV; PET scan- bulky LN [Dr.Vaught] above/below Diaphragm; No spleen/liver/bone;   # 22nd October 2019- Bil PE on lovenox xstopped; NOV 2019- Eliquis 5 mg BID. AUG 2020-slight incresae in RP LN ~1cm; continue Ibrutinib  # DM/HTN/ CKD- stage III -----------------------------------------------   MOLECULAR TESTING: Trisomy 12. 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  Patient wife available at the appointment today.  [name] 68 y.o.  male above diagnosis of of SLL/CLL on ibrutinib; incidental bilateral PE is here for follow-up.   Patient continues to have mild to moderate fatigue.  Otherwise denies any nausea vomiting or blood in stools or black or stools.  He continues to on p.o. iron.  Complains of easy bruising but no spontaneous bleeding.  Otherwise fairly active at home.  Review of Systems  Constitutional: Positive for malaise/fatigue. Negative for chills, diaphoresis, fever and weight loss.  HENT: Negative for sore throat.   Eyes: Negative for double vision.  Respiratory: Negative for cough, hemoptysis, sputum production, shortness of breath and wheezing.   Cardiovascular: Negative for chest pain, palpitations, orthopnea and leg swelling.  Gastrointestinal: Negative for abdominal pain, blood in stool,  constipation, diarrhea, heartburn and melena.  Genitourinary: Negative for dysuria, frequency and urgency.  Musculoskeletal: Positive for back pain and joint pain.  Skin: Negative.  Negative for itching and rash.  Neurological: Negative for dizziness, tingling, focal weakness, weakness and headaches.  Endo/Heme/Allergies: Bruises/bleeds easily.  Psychiatric/Behavioral: Negative for depression. The patient is not nervous/anxious and does not have insomnia.      MEDICAL HISTORY:  Past Medical History:  Diagnosis Date  . Anxiety   . Arthritis   . 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 HOH (hard of hearing)   . Hypertension   . Osteomyelitis of toe ([place]) 06/08/11   right foot  . Seasonal allergies   . Type II diabetes mellitus ([place])    diet controlled    SURGICAL HISTORY: Past Surgical History:  Procedure Laterality Date  . AMPUTATION  06/08/2011   Procedure: AMPUTATION RAY;  Surgeon: Wylene Simmer, MD;  Location: [facility];  Service: Orthopedics;  Laterality: Right;  Righ Hallux Amputation  . [place]   "crushed"  . FRACTURE SURGERY    . 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 PILONIDAL CYST / SINUS EXCISION  1970's  . TOE AMPUTATION  06/08/11   partial; right great toe    SOCIAL HISTORY: Social History   Socioeconomic History  . Marital status: Married    Spouse name: Not on file  . Number of children: Not on file  . Years of education: Not on file  . Highest education level: Not on file  Occupational History  . Not on file  Tobacco Use  . Smoking status:  Former Smoker    Packs/day: 1.50    Years: 22.00    Pack years: 33.00    Types: Cigarettes    Quit date: 05/24/2011    Years since quitting: 7.7  . Smokeless tobacco: Former Systems developer    Types: [place] date: 03/07/1983  Substance and Sexual Activity  . Alcohol use: Yes    Comment: 06/08/11 "no liquor for 2 1/2 years; last beer 05/31/11"  . Drug use: No  . Sexual activity: Yes  Other Topics  Concern  . Not on file  Social History Narrative   Married   Social Determinants of Health   Financial Resource Strain:   . Difficulty of Paying Living Expenses: Not on file  Food Insecurity:   . Worried About Charity fundraiser in the Last Year: Not on file  . Ran Out of Food in the Last Year: Not on file  Transportation Needs:   . Lack of Transportation (Medical): Not on file  . Lack of Transportation (Non-Medical): Not on file  Physical Activity:   . Days of Exercise per Week: Not on file  . Minutes of Exercise per Session: Not on file  Stress:   . Feeling of Stress : Not on file  Social Connections:   . Frequency of Communication with Friends and Family: Not on file  . Frequency of Social Gatherings with Friends and Family: Not on file  . Attends Religious Services: Not on file  . Active Member of Clubs or Organizations: Not on file  . Attends Archivist Meetings: Not on file  . Marital Status: Not on file  Intimate Partner Violence:   . Fear of Current or Ex-Partner: Not on file  . Emotionally Abused: Not on file  . Physically Abused: Not on file  . Sexually Abused: Not on file    FAMILY HISTORY: Family History  Problem Relation Age of Onset  . Prostate cancer Father   . Hypertension Father   . Other Mother        VARICOSE VEINS    ALLERGIES:  is allergic to penicillins.  MEDICATIONS:  Current Outpatient Medications  Medication Sig Dispense Refill  . allopurinol (ZYLOPRIM) 300 MG tablet Take 1 tablet (300 mg total) by mouth 2 (two) times daily. 180 tablet 0  . amLODipine (NORVASC) 5 MG tablet Take 1 tablet (5 mg total) by mouth daily. 90 tablet 0  . apixaban (ELIQUIS) 5 MG TABS tablet Take 1 tablet (5 mg total) by mouth 2 (two) times daily. 180 tablet 1  . Ascorbic Acid (VITAMIN C) 100 MG tablet Take 100 mg by mouth daily.    Marland Kitchen ECHINACEA PO Take 760 mg by mouth daily.    . fluticasone (FLONASE) 50 MCG/ACT nasal spray Place 2 sprays into both  nostrils daily as needed for allergies or rhinitis.    . Ginseng 100 MG CAPS Take 100 mg by mouth daily.    . Ibrutinib (IMBRUVICA) 420 MG TABS Take 420 mg by mouth daily. 30 tablet 3  . JANUVIA 100 MG tablet Take 100 mg by mouth daily.  3  . lisinopril (PRINIVIL,ZESTRIL) 40 MG tablet Take 40 mg by mouth daily.    . metFORMIN (GLUCOPHAGE) 500 MG tablet Take 1,000 mg by mouth 2 (two) times daily.    . mupirocin ointment (BACTROBAN) 2 % APPLY WITH Q TIP THREE TIMES DAILY AS NEEDED FOR DRYNESS AND CRUSTING  3  . Omega-3 Fatty Acids (OMEGA 3 500) 500 MG  CAPS Take 500 mg by mouth daily.    . ondansetron (ZOFRAN) 4 MG tablet TAKE 1 TABLET BY MOUTH EVERY 8 HOURS AS NEEDED FOR NAUSEA FOR VOMITING 20 tablet 2  . ONE TOUCH ULTRA TEST test strip USE 1 STRIP TO CHECK GLUCOSE ONCE A WEEK  99  . pravastatin (PRAVACHOL) 40 MG tablet Take 40 mg by mouth daily.    . Saw Palmetto 450 MG CAPS Take 450 mg by mouth daily.     No current facility-administered medications for this visit.      Marland Kitchen  PHYSICAL EXAMINATION: ECOG PERFORMANCE STATUS: 1 - Symptomatic but completely ambulatory  Vitals:   02/24/19 1357  BP: (!) 161/81  Pulse: 85  Temp: 98.6 F (37 C)   Filed Weights   02/24/19 1357  Weight: 197 lb 4 oz (89.5 kg)    Physical Exam  Constitutional: He is oriented to person, place, and time and well-developed, well-nourished, and in no distress.  Alone.  HENT:  Head: Normocephalic and atraumatic.  Mouth/Throat: Oropharynx is clear and moist. No oropharyngeal exudate.  Eyes: Pupils are equal, round, and reactive to light.  Cardiovascular: Normal rate and regular rhythm.  Pulmonary/Chest: No respiratory distress. He has no wheezes.  Abdominal: Soft. Bowel sounds are normal. He exhibits no distension and no mass. There is no abdominal tenderness. There is no rebound and no guarding.  Musculoskeletal:        General: No tenderness or edema. Normal range of motion.     Cervical back: Normal range  of motion and neck supple.  Lymphadenopathy:  Significant improvement of the lymphadenopathy in the neck/resolved; scattered approximately 1 cm few lymph nodes present in the right left underarm.  Neurological: He is alert and oriented to person, place, and time.  Skin: Skin is warm.  Psychiatric: Affect normal.     LABORATORY DATA:  I have reviewed the data as listed Lab Results  Component Value Date   WBC 9.8 02/24/2019   HGB 9.2 (L) 02/24/2019   HCT 31.8 (L) 02/24/2019   MCV 80.9 02/24/2019   PLT 376 02/24/2019   Recent Labs    12/18/18 1408 01/27/19 1432 02/24/19 1342  NA 138 136 137  K 4.2 4.5 4.0  CL 101 104 102  CO2 _0 GLUCOSE 171* 131* 169*  BUN 26* 27* 30*  CREATININE 1.23 1.08 1.44*  CALCIUM 10.0 9.1 9.6  GFRNONAA >60 >60 50*  GFRAA >60 >60 58*  PROT 6.6 6.5 6.6  ALBUMIN 3.5 3.4* 3.4*  AST 14* 15 12*  ALT _1 ALKPHOS 60 53 58  BILITOT 0.3 0.4 0.4    RADIOGRAPHIC STUDIES: I have personally reviewed the radiological images as listed and agreed with the findings in the report. No results found.  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  IGVH mutated. On Ibrutinib 420 mg/day; CT nov 2020- CT C/A/P- STABLE ~4 cm/bulky retroperitoneal adenopathy.   # Continue Imbruvica at this time; tolerating fairly well; except  hemoglobin today 9.2/see below. White count-12; platelets 464.   # Mild-moderate anemia-hemoglobin 9.2; worse? Etiology- continue  Vitron C a day; iron studies studies of iron deficiency. Recommend GI evaluation- Upper GI/colonoscopy.  Check stool occult x3 pt will call and let us know re: referal. relutant with IV iron; possible IV iron at next visit.   # HTN -again reviewed blood pressure at home  130-s-140s sec to  Ibrutinib; continue lisinopril 40 mg a day/  norvasc 5 mg/day.  Stable.   # Bilateral PE On eliquis 5 mg BID- STABLE; will have to  come off eliquis prior to invasive GI procedures.   # I spoke at length with the patient's family, Hoyle Sauer- regarding the patient's clinical status/plan of care.   DISPOSITION: # stool occult x3- will decide on GI  # follow up in 1 month- MD; labs- cbc/cmp; possible Venofer; Dr.B    All questions were answered. The patient knows to call the clinic with any problems, questions or concerns.     Cammie Sickle, MD 02/25/2019 7:26 AM

## 2019-02-25 MED FILL — IMBRUVICA 420 MG TAB: 420 | 28 days supply | Qty: 28 | Fill #3

## 2019-03-01 DIAGNOSIS — C8308 Small cell B-cell lymphoma, lymph nodes of multiple sites: Secondary | ICD-10-CM | POA: Diagnosis not present

## 2019-03-10 ENCOUNTER — Telehealth: Payer: Self-pay | Admitting: Pharmacy Technician

## 2019-03-10 NOTE — Telephone Encounter (Signed)
Oral Oncology Patient Advocate Encounter   Was successful in obtaining a copay card for [name].  This copay card will make the patients copay $10.00.  I have spoken with the patient.    The billing information is as follows and has been shared with [facility].   RxBin: [id number] PCN: Loyalty Member ID: [id number] Group ID: [id number]   [name] [name] Phone [phone] Fax [phone] 03/10/2019 10:48 AM

## 2019-03-12 ENCOUNTER — Other Ambulatory Visit: Payer: Self-pay

## 2019-03-12 ENCOUNTER — Other Ambulatory Visit: Payer: Self-pay | Admitting: *Deleted

## 2019-03-12 DIAGNOSIS — D500 Iron deficiency anemia secondary to blood loss (chronic): Secondary | ICD-10-CM

## 2019-03-12 LAB — OCCULT BLOOD X 1 CARD TO LAB, STOOL
Fecal Occult Bld: POSITIVE — AB
Fecal Occult Bld: POSITIVE — AB
Fecal Occult Bld: POSITIVE — AB

## 2019-03-13 ENCOUNTER — Telehealth: Payer: Self-pay | Admitting: Internal Medicine

## 2019-03-13 DIAGNOSIS — R195 Other fecal abnormalities: Secondary | ICD-10-CM

## 2019-03-13 NOTE — Telephone Encounter (Signed)
On 1/7- spoke to pts wife re:

## 2019-03-13 NOTE — Addendum Note (Signed)
Addended by: Renita Papa R on: 03/13/2019 08:30 AM   Modules accepted: Orders

## 2019-03-13 NOTE — Telephone Encounter (Signed)
Referral entered into epic to Dr. Vicente Males.

## 2019-03-13 NOTE — Telephone Encounter (Signed)
H/T- Please inform patient/wife-but his Hemoccult is positive for blood.  I would recommend further evaluation with GI doctor-regarding colonoscopy.  I will also try to reach out to patient/wife later in the day.   If patient has no preference for GI doctor; please make a referral to Dr. Vicente Males- GI re: Hemoccult-positive stools /colonoscopy.

## 2019-03-14 NOTE — Telephone Encounter (Signed)
On 1/7-spoke to patient's wife regarding results of Hemoccult positive stool.  Recommend endoscopies.  Refer to GI.  Patient on Eliquis/ibrutinib-which will need to be held prior to endoscopies.  Will discuss with GI prior to procedures.

## 2019-03-21 ENCOUNTER — Other Ambulatory Visit: Payer: Self-pay | Admitting: *Deleted

## 2019-03-21 NOTE — Telephone Encounter (Signed)
Dr. B said patient can come off the allopurinol. No longer needed. Pls inform patient.

## 2019-03-21 NOTE — Telephone Encounter (Signed)
Patient will be out of this over the weekend

## 2019-03-21 NOTE — Telephone Encounter (Signed)
[name] informed of doctor response. She will let patient know

## 2019-03-24 ENCOUNTER — Other Ambulatory Visit: Payer: Self-pay

## 2019-03-24 ENCOUNTER — Inpatient Hospital Stay: Payer: Medicare Other

## 2019-03-24 ENCOUNTER — Inpatient Hospital Stay (HOSPITAL_BASED_OUTPATIENT_CLINIC_OR_DEPARTMENT_OTHER): Payer: Medicare Other | Admitting: Internal Medicine

## 2019-03-24 ENCOUNTER — Inpatient Hospital Stay: Payer: Medicare Other | Attending: Internal Medicine

## 2019-03-24 ENCOUNTER — Encounter: Payer: Self-pay | Admitting: *Deleted

## 2019-03-24 ENCOUNTER — Other Ambulatory Visit: Payer: Self-pay | Admitting: *Deleted

## 2019-03-24 VITALS — BP 180/70 | HR 92 | Temp 98.9°F | Wt 194.0 lb

## 2019-03-24 DIAGNOSIS — D500 Iron deficiency anemia secondary to blood loss (chronic): Secondary | ICD-10-CM

## 2019-03-24 DIAGNOSIS — D509 Iron deficiency anemia, unspecified: Secondary | ICD-10-CM | POA: Diagnosis not present

## 2019-03-24 DIAGNOSIS — C8308 Small cell B-cell lymphoma, lymph nodes of multiple sites: Secondary | ICD-10-CM

## 2019-03-24 LAB — CBC WITH DIFFERENTIAL/PLATELET
Abs Immature Granulocytes: 0.06 10*3/uL (ref 0.00–0.07)
Basophils Absolute: 0.1 10*3/uL (ref 0.0–0.1)
Basophils Relative: 1 %
Eosinophils Absolute: 0.1 10*3/uL (ref 0.0–0.5)
Eosinophils Relative: 1 %
HCT: 29 % — ABNORMAL LOW (ref 39.0–52.0)
Hemoglobin: 8.3 g/dL — ABNORMAL LOW (ref 13.0–17.0)
Immature Granulocytes: 1 %
Lymphocytes Relative: 16 %
Lymphs Abs: 1.6 10*3/uL (ref 0.7–4.0)
MCH: 23.1 pg — ABNORMAL LOW (ref 26.0–34.0)
MCHC: 28.6 g/dL — ABNORMAL LOW (ref 30.0–36.0)
MCV: 80.6 fL (ref 80.0–100.0)
Monocytes Absolute: 1.3 10*3/uL — ABNORMAL HIGH (ref 0.1–1.0)
Monocytes Relative: 13 %
Neutro Abs: 6.7 10*3/uL (ref 1.7–7.7)
Neutrophils Relative %: 68 %
Platelets: 354 10*3/uL (ref 150–400)
RBC: 3.6 MIL/uL — ABNORMAL LOW (ref 4.22–5.81)
RDW: 16.8 % — ABNORMAL HIGH (ref 11.5–15.5)
WBC: 9.8 10*3/uL (ref 4.0–10.5)
nRBC: 0 % (ref 0.0–0.2)

## 2019-03-24 LAB — COMPREHENSIVE METABOLIC PANEL
ALT: 10 U/L (ref 0–44)
AST: 12 U/L — ABNORMAL LOW (ref 15–41)
Albumin: 3.3 g/dL — ABNORMAL LOW (ref 3.5–5.0)
Alkaline Phosphatase: 58 U/L (ref 38–126)
Anion gap: 8 (ref 5–15)
BUN: 24 mg/dL — ABNORMAL HIGH (ref 8–23)
CO2: 26 mmol/L (ref 22–32)
Calcium: 9.5 mg/dL (ref 8.9–10.3)
Chloride: 103 mmol/L (ref 98–111)
Creatinine, Ser: 1.39 mg/dL — ABNORMAL HIGH (ref 0.61–1.24)
GFR calc Af Amer: >60 mL/min (ref >60)
GFR calc non Af Amer: 52 mL/min — ABNORMAL LOW (ref >60)
Glucose, Bld: 142 mg/dL — ABNORMAL HIGH (ref 70–99)
Potassium: 4.1 mmol/L (ref 3.5–5.1)
Sodium: 137 mmol/L (ref 135–145)
Total Bilirubin: 0.4 mg/dL (ref 0.3–1.2)
Total Protein: 6.7 g/dL (ref 6.5–8.1)

## 2019-03-24 MED ORDER — IRON SUCROSE 20 MG/ML IV SOLN
200.0000 mg | Freq: Once | INTRAVENOUS | Status: AC
  Administered 2019-03-24: 16:00:00 200 mg via INTRAVENOUS
  Filled 2019-03-24: qty 10

## 2019-03-24 MED ORDER — SODIUM CHLORIDE 0.9 % IV SOLN
Freq: Once | INTRAVENOUS | Status: AC
  Filled 2019-03-24: qty 250

## 2019-03-24 NOTE — Patient Instructions (Addendum)
#   STOP Eliquis  # call GI doctors office to re: worsening anemia-ASAP.

## 2019-03-24 NOTE — Progress Notes (Signed)
[facility] NOTE  Patient Care Team: [name], MD as PCP - General (Internal Medicine)  CHIEF COMPLAINTS/PURPOSE OF CONSULTATION: SMALL CELL LYMPHOMA/CLL  #  Oncology History Overview Note  # October 2019- SMALL CELL LYMPHOMA; ki-67-10%. STAGE- III/ IV; PET scan- bulky LN [Dr.Vaught] above/below Diaphragm; No spleen/liver/bone;   # 22nd October 2019- Bil PE on lovenox xstopped; NOV 2019- Eliquis 5 mg BID. AUG 2020-slight incresae in RP LN ~1cm; continue Ibrutinib;  # worsening Anemia- Jan 2021- Hb 8.5; STOP eliquis; FOBT- positive.   # DM/HTN/ CKD- stage III -----------------------------------------------   MOLECULAR TESTING: Trisomy 12. 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  Patient wife available at the appointment today.  [name] 69 y.o.  male above diagnosis of of SLL/CLL on ibrutinib; incidental bilateral PE on Eliquis is here for follow-up.   At the last visit patient was referred to GI for his worsening anemia.   Patient continues to have mild to moderate fatigue.  Denies any nausea vomiting.  Complains of easy bruising.  He continues been p.o. iron.  He denies any rectal bleeding.   Review of Systems  Constitutional: Positive for malaise/fatigue. Negative for chills, diaphoresis, fever and weight loss.  HENT: Negative for sore throat.   Eyes: Negative for double vision.  Respiratory: Negative for cough, hemoptysis, sputum production, shortness of breath and wheezing.   Cardiovascular: Negative for chest pain, palpitations, orthopnea and leg  swelling.  Gastrointestinal: Negative for abdominal pain, blood in stool, constipation, diarrhea, heartburn and melena.  Genitourinary: Negative for dysuria, frequency and urgency.  Musculoskeletal: Positive for back pain and joint pain.  Skin: Negative.  Negative for itching and rash.  Neurological: Negative for dizziness, tingling, focal weakness, weakness and headaches.  Endo/Heme/Allergies: Bruises/bleeds easily.  Psychiatric/Behavioral: Negative for depression. The patient is not nervous/anxious and does not have insomnia.      MEDICAL HISTORY:  Past Medical History:  Diagnosis Date  . Anxiety   . Arthritis   . 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 HOH (hard of hearing)   . Hypertension   . Osteomyelitis of toe ([place]) 06/08/11   right foot  . Seasonal allergies   . Type II diabetes mellitus ([place])    diet controlled    SURGICAL HISTORY: Past Surgical History:  Procedure Laterality Date  . AMPUTATION  06/08/2011   Procedure: AMPUTATION RAY;  Surgeon: Wylene Simmer, MD;  Location: [facility];  Service: Orthopedics;  Laterality: Right;  Righ Hallux Amputation  . [place]   "crushed"  . FRACTURE SURGERY    . 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 PILONIDAL CYST / SINUS EXCISION  1970's  . TOE AMPUTATION  06/08/11   partial; right great toe    SOCIAL HISTORY: Social History   Socioeconomic History  . Marital status: Married    Spouse name: Not on file  . Number of children: Not on file  . Years of education: Not on file  . Highest education level: Not  on file  Occupational History  . Not on file  Tobacco Use  . Smoking status: Former Smoker    Packs/day: 1.50    Years: 22.00    Pack years: 33.00    Types: Cigarettes    Quit date: 05/24/2011    Years since quitting: 7.8  . Smokeless tobacco: Former Systems developer    Types: [place] date: 03/07/1983  Substance and Sexual Activity  . Alcohol use: Yes    Comment: 06/08/11 "no liquor for 2 1/2 years;  last beer 05/31/11"  . Drug use: No  . Sexual activity: Yes  Other Topics Concern  . Not on file  Social History Narrative   Married   Social Determinants of Health   Financial Resource Strain:   . Difficulty of Paying Living Expenses: Not on file  Food Insecurity:   . Worried About Charity fundraiser in the Last Year: Not on file  . Ran Out of Food in the Last Year: Not on file  Transportation Needs:   . Lack of Transportation (Medical): Not on file  . Lack of Transportation (Non-Medical): Not on file  Physical Activity:   . Days of Exercise per Week: Not on file  . Minutes of Exercise per Session: Not on file  Stress:   . Feeling of Stress : Not on file  Social Connections:   . Frequency of Communication with Friends and Family: Not on file  . Frequency of Social Gatherings with Friends and Family: Not on file  . Attends Religious Services: Not on file  . Active Member of Clubs or Organizations: Not on file  . Attends Archivist Meetings: Not on file  . Marital Status: Not on file  Intimate Partner Violence:   . Fear of Current or Ex-Partner: Not on file  . Emotionally Abused: Not on file  . Physically Abused: Not on file  . Sexually Abused: Not on file    FAMILY HISTORY: Family History  Problem Relation Age of Onset  . Prostate cancer Father   . Hypertension Father   . Other Mother        VARICOSE VEINS    ALLERGIES:  is allergic to penicillins.  MEDICATIONS:  Current Outpatient Medications  Medication Sig Dispense Refill  . amLODipine (NORVASC) 5 MG tablet Take 1 tablet (5 mg total) by mouth daily. 90 tablet 0  . apixaban (ELIQUIS) 5 MG TABS tablet Take 1 tablet (5 mg total) by mouth 2 (two) times daily. 180 tablet 1  . Ascorbic Acid (VITAMIN C) 100 MG tablet Take 100 mg by mouth daily.    Marland Kitchen ECHINACEA PO Take 760 mg by mouth daily.    . fluticasone (FLONASE) 50 MCG/ACT nasal spray Place 2 sprays into both nostrils daily as needed for allergies or  rhinitis.    . Ginseng 100 MG CAPS Take 100 mg by mouth daily.    Marland Kitchen JANUVIA 100 MG tablet Take 100 mg by mouth daily.  3  . lisinopril (PRINIVIL,ZESTRIL) 40 MG tablet Take 40 mg by mouth daily.    . metFORMIN (GLUCOPHAGE) 500 MG tablet Take 1,000 mg by mouth 2 (two) times daily.    . mupirocin ointment (BACTROBAN) 2 % APPLY WITH Q TIP THREE TIMES DAILY AS NEEDED FOR DRYNESS AND CRUSTING  3  . Omega-3 Fatty Acids (OMEGA 3 500) 500 MG CAPS Take 500 mg by mouth daily.    . ondansetron (ZOFRAN) 4 MG tablet TAKE 1 TABLET BY MOUTH  EVERY 8 HOURS AS NEEDED FOR NAUSEA FOR VOMITING 20 tablet 2  . ONE TOUCH ULTRA TEST test strip USE 1 STRIP TO CHECK GLUCOSE ONCE A WEEK  99  . pravastatin (PRAVACHOL) 40 MG tablet Take 40 mg by mouth daily.    . Saw Palmetto 450 MG CAPS Take 450 mg by mouth daily.    Marland Kitchen allopurinol (ZYLOPRIM) 300 MG tablet Take 1 tablet (300 mg total) by mouth 2 (two) times daily. (Patient not taking: Reported on 03/21/2019) 180 tablet 0  . IMBRUVICA 420 MG TABS TAKE 1 TABLET (420 MG) BY MOUTH DAILY. 28 tablet 3   No current facility-administered medications for this visit.      Marland Kitchen  PHYSICAL EXAMINATION: ECOG PERFORMANCE STATUS: 1 - Symptomatic but completely ambulatory  Vitals:   03/21/19 1547  BP: (!) 180/70  Pulse: 92  Temp: 98.9 F (37.2 C)   Filed Weights   03/21/19 1547  Weight: 194 lb (88 kg)    Physical Exam  Constitutional: He is oriented to person, place, and time and well-developed, well-nourished, and in no distress.  Alone.  HENT:  Head: Normocephalic and atraumatic.  Mouth/Throat: Oropharynx is clear and moist. No oropharyngeal exudate.  Eyes: Pupils are equal, round, and reactive to light.  Cardiovascular: Normal rate and regular rhythm.  Pulmonary/Chest: No respiratory distress. He has no wheezes.  Abdominal: Soft. Bowel sounds are normal. He exhibits no distension and no mass. There is no abdominal tenderness. There is no rebound and no guarding.   Musculoskeletal:        General: No tenderness or edema. Normal range of motion.     Cervical back: Normal range of motion and neck supple.  Lymphadenopathy:  Significant improvement of the lymphadenopathy in the neck/resolved; scattered approximately 1 cm few lymph nodes present in the right left underarm.  Neurological: He is alert and oriented to person, place, and time.  Skin: Skin is warm.  Psychiatric: Affect normal.     LABORATORY DATA:  I have reviewed the data as listed Lab Results  Component Value Date   WBC 9.8 03/24/2019   HGB 8.3 (L) 03/24/2019   HCT 29.0 (L) 03/24/2019   MCV 80.6 03/24/2019   PLT 354 03/24/2019   Recent Labs    01/27/19 1432 02/24/19 1342 03/24/19 1354  NA 136 137 137  K 4.5 4.0 4.1  CL 104 102 103  CO2 _0 GLUCOSE 131* 169* 142*  BUN 27* 30* 24*  CREATININE 1.08 1.44* 1.39*  CALCIUM 9.1 9.6 9.5  GFRNONAA >60 50* 52*  GFRAA >60 58* >60  PROT 6.5 6.6 6.7  ALBUMIN 3.4* 3.4* 3.3*  AST 15 12* 12*  ALT _1 ALKPHOS 53 58 58  BILITOT 0.4 0.4 0.4    RADIOGRAPHIC STUDIES: I have personally reviewed the radiological images as listed and agreed with the findings in the report. No results found.  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  IGVH mutated. On Ibrutinib 420 mg/day; CT nov 2020- CT C/A/P- STABLE ~4 cm/bulky retroperitoneal adenopathy.   # Continue Imbruvica at this time; tolerating fairly well; except  hemoglobin today 8.3/see below. White count-12; platelets 464.   #Anemia-hemoglobin 8.3 worse-iron deficiency.  FOBT positive.  Referral made to GI; patient has not returned phone calls.  Extensively discussed with patient's wife at the time of referral.  Again reluctant today.  Strongly  encouraged the patient to make appointment ASAP.  Proceed with Venofer today. Discussed the potential acute infusion reactions with IV iron; which  are quite rare.  Patient understands the risk; will proceed with infusions.   #Bilateral PE [sep 2019]-currently on Eliquis.  I think is reasonable to stop Eliquis-as patient has been on anticoagulation for more than a year now.  Recommend   # HTN -092Z systolic.  However as per family blood pressure at home 130-s-140s sec to  Ibrutinib; continue lisinopril 40 mg a day/  norvasc 5 mg/day.  Stable..   # I spoke at length with the patient's family, Hoyle Sauer- regarding the patient's clinical status/plan of care.  Again strongly recommend call GI office ASAP  DISPOSITION: # proceed with venofer today # follow up in 2  weeks MD; labs- cbc/cmp; HOLD tube; possible Venofer; Dr.B    All questions were answered. The patient knows to call the clinic with any problems, questions or concerns.     Cammie Sickle, MD 03/26/2019 7:51 AM

## 2019-03-24 NOTE — Assessment & Plan Note (Addendum)
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 On Ibrutinib 420 mg/day; CT nov 2020- CT C/A/P- STABLE ~4 cm/bulky retroperitoneal adenopathy.   # Continue Imbruvica at this time; tolerating fairly well; except  hemoglobin today 8.3/see below. White count-12; platelets 464.   #Anemia-hemoglobin 8.3 worse-iron deficiency.  FOBT positive.  Referral made to GI; patient has not returned phone calls.  Extensively discussed with patient's wife at the time of referral.  Again reluctant today.  Strongly encouraged the patient to make appointment ASAP.  Proceed with Venofer today. Discussed the potential acute infusion reactions with IV iron; which are quite rare.  Patient understands the risk; will proceed with infusions.   #Bilateral PE [sep 2019]-currently on Eliquis.  I think is reasonable to stop Eliquis-as patient has been on anticoagulation for more than a year now.  Recommend   # HTN -[id number] systolic.  However as per family blood pressure at home 130-s-140s sec to  Ibrutinib; continue lisinopril 40 mg a day/  norvasc 5 mg/day.  Stable..   # I spoke at length with the patient's family, Hoyle Sauer- regarding the patient's clinical status/plan of care.  Again strongly recommend call GI office ASAP  DISPOSITION: # proceed with venofer today # follow up in 2  weeks MD; labs- cbc/cmp; HOLD tube; possible Venofer; Dr.B

## 2019-03-25 ENCOUNTER — Other Ambulatory Visit: Payer: Self-pay | Admitting: Internal Medicine

## 2019-03-25 DIAGNOSIS — C8308 Small cell B-cell lymphoma, lymph nodes of multiple sites: Secondary | ICD-10-CM

## 2019-03-25 NOTE — Telephone Encounter (Signed)
CBC with Differential Order: [id number] Status:  Final result  Visible to patient:  No (inaccessible in [place])  Next appt:  04/07/2019 at 01:00 PM in Oncology ([facility])  Dx:  Iron deficiency anemia due to chronic...  Ref Range & Units 1 d ago  WBC 4.0 - 10.5 K/uL 9.8   RBC 4.22 - 5.81 MIL/uL 3.60Low    Hemoglobin 13.0 - 17.0 g/dL 8.3Low    HCT 39.0 - 52.0 % 29.0Low    MCV 80.0 - 100.0 fL 80.6   MCH 26.0 - 34.0 pg 23.1Low    MCHC 30.0 - 36.0 g/dL 28.6Low    RDW 11.5 - 15.5 % 16.8High    Platelets 150 - 400 K/uL 354   nRBC 0.0 - 0.2 % 0.0   Neutrophils Relative % % 68   Neutro Abs 1.7 - 7.7 K/uL 6.7   Lymphocytes Relative % 16   Lymphs Abs 0.7 - 4.0 K/uL 1.6   Monocytes Relative % 13   Monocytes Absolute 0.1 - 1.0 K/uL 1.3High    Eosinophils Relative % 1   Eosinophils Absolute 0.0 - 0.5 K/uL 0.1   Basophils Relative % 1   Basophils Absolute 0.0 - 0.1 K/uL 0.1   Immature Granulocytes % 1   Abs Immature Granulocytes 0.00 - 0.07 K/uL 0.06   Comment: Performed at [facility], [address] [place], [place] [address]  Resulting Agency  [facility]      Specimen Collected: 03/24/19 13:54  Last Resulted: 03/24/19 14:02     Lab Flowsheet   Order Details   View Encounter   Lab and Collection Details   Routing   Result History         Other Results from 03/24/2019  Contains abnormal data Comprehensive metabolic panel  Status:  Final result  Visible to patient:  No (inaccessible in MyChart)  Next appt:  04/07/2019 at 01:00 PM in Oncology ([facility])  Dx:  Iron deficiency anemia due to chronic... Order: [id number]  Ref Range & Units 1 d ago  Sodium 135 - 145 mmol/L 137   Potassium 3.5 - 5.1 mmol/L 4.1   Chloride 98 - 111 mmol/L 103   CO2 22 - 32 mmol/L 26   Glucose, Bld 70 - 99 mg/dL 142High    BUN 8 - 23 mg/dL 24High    Creatinine, Ser 0.61 - 1.24 mg/dL 1.39High    Calcium 8.9 - 10.3 mg/dL 9.5   Total Protein 6.5 - 8.1 g/dL 6.7   Albumin 3.5 -  5.0 g/dL 3.3Low    AST 15 - 41 U/L 12Low    ALT 0 - 44 U/L 10   Alkaline Phosphatase 38 - 126 U/L 58   Total Bilirubin 0.3 - 1.2 mg/dL 0.4   GFR calc non Af Amer >60 mL/min 52Low    GFR calc Af Amer >60 mL/min >60   Anion gap 5 - 15 8   Comment: Performed at [facility], [address] [place],  [address]  Resulting Agency  [facility]      Specimen Collected: 03/24/19 13:54  Last Resulted: 03/24/19 14:35

## 2019-03-27 MED FILL — IMBRUVICA 420 MG TAB: 420 | 28 days supply | Qty: 28 | Fill #0

## 2019-04-04 ENCOUNTER — Other Ambulatory Visit: Payer: Self-pay

## 2019-04-04 NOTE — Progress Notes (Signed)
Patient pre screened for office appointment, no questions or concerns today. Patient reminded of upcoming appointment time and date. 

## 2019-04-07 ENCOUNTER — Other Ambulatory Visit: Payer: Self-pay

## 2019-04-07 ENCOUNTER — Inpatient Hospital Stay: Payer: Medicare Other | Attending: Internal Medicine

## 2019-04-07 ENCOUNTER — Inpatient Hospital Stay: Payer: Medicare Other

## 2019-04-07 ENCOUNTER — Other Ambulatory Visit: Payer: Self-pay | Admitting: *Deleted

## 2019-04-07 ENCOUNTER — Inpatient Hospital Stay (HOSPITAL_BASED_OUTPATIENT_CLINIC_OR_DEPARTMENT_OTHER): Payer: Medicare Other | Admitting: Internal Medicine

## 2019-04-07 DIAGNOSIS — R5383 Other fatigue: Secondary | ICD-10-CM | POA: Insufficient documentation

## 2019-04-07 DIAGNOSIS — C8308 Small cell B-cell lymphoma, lymph nodes of multiple sites: Secondary | ICD-10-CM | POA: Diagnosis not present

## 2019-04-07 DIAGNOSIS — D509 Iron deficiency anemia, unspecified: Secondary | ICD-10-CM | POA: Diagnosis present

## 2019-04-07 DIAGNOSIS — Z87891 Personal history of nicotine dependence: Secondary | ICD-10-CM | POA: Diagnosis not present

## 2019-04-07 DIAGNOSIS — I2699 Other pulmonary embolism without acute cor pulmonale: Secondary | ICD-10-CM | POA: Diagnosis not present

## 2019-04-07 DIAGNOSIS — I10 Essential (primary) hypertension: Secondary | ICD-10-CM | POA: Insufficient documentation

## 2019-04-07 DIAGNOSIS — Z7901 Long term (current) use of anticoagulants: Secondary | ICD-10-CM | POA: Diagnosis not present

## 2019-04-07 DIAGNOSIS — D500 Iron deficiency anemia secondary to blood loss (chronic): Secondary | ICD-10-CM

## 2019-04-07 LAB — COMPREHENSIVE METABOLIC PANEL
ALT: 10 U/L (ref 0–44)
AST: 12 U/L — ABNORMAL LOW (ref 15–41)
Albumin: 3.2 g/dL — ABNORMAL LOW (ref 3.5–5.0)
Alkaline Phosphatase: 57 U/L (ref 38–126)
Anion gap: 9 (ref 5–15)
BUN: 24 mg/dL — ABNORMAL HIGH (ref 8–23)
CO2: 26 mmol/L (ref 22–32)
Calcium: 9.5 mg/dL (ref 8.9–10.3)
Chloride: 100 mmol/L (ref 98–111)
Creatinine, Ser: 1.38 mg/dL — ABNORMAL HIGH (ref 0.61–1.24)
GFR calc Af Amer: >60 mL/min (ref >60)
GFR calc non Af Amer: 53 mL/min — ABNORMAL LOW (ref >60)
Glucose, Bld: 186 mg/dL — ABNORMAL HIGH (ref 70–99)
Potassium: 4.2 mmol/L (ref 3.5–5.1)
Sodium: 135 mmol/L (ref 135–145)
Total Bilirubin: 0.5 mg/dL (ref 0.3–1.2)
Total Protein: 6.2 g/dL — ABNORMAL LOW (ref 6.5–8.1)

## 2019-04-07 LAB — CBC WITH DIFFERENTIAL/PLATELET
Abs Immature Granulocytes: 0.09 10*3/uL — ABNORMAL HIGH (ref 0.00–0.07)
Basophils Absolute: 0.1 10*3/uL (ref 0.0–0.1)
Basophils Relative: 1 %
Eosinophils Absolute: 0.1 10*3/uL (ref 0.0–0.5)
Eosinophils Relative: 1 %
HCT: 28.8 % — ABNORMAL LOW (ref 39.0–52.0)
Hemoglobin: 8.4 g/dL — ABNORMAL LOW (ref 13.0–17.0)
Immature Granulocytes: 1 %
Lymphocytes Relative: 15 %
Lymphs Abs: 1.3 10*3/uL (ref 0.7–4.0)
MCH: 22.7 pg — ABNORMAL LOW (ref 26.0–34.0)
MCHC: 29.2 g/dL — ABNORMAL LOW (ref 30.0–36.0)
MCV: 77.8 fL — ABNORMAL LOW (ref 80.0–100.0)
Monocytes Absolute: 1.3 10*3/uL — ABNORMAL HIGH (ref 0.1–1.0)
Monocytes Relative: 15 %
Neutro Abs: 6.1 10*3/uL (ref 1.7–7.7)
Neutrophils Relative %: 67 %
Platelets: 335 10*3/uL (ref 150–400)
RBC: 3.7 MIL/uL — ABNORMAL LOW (ref 4.22–5.81)
RDW: 16.3 % — ABNORMAL HIGH (ref 11.5–15.5)
WBC: 8.9 10*3/uL (ref 4.0–10.5)
nRBC: 0 % (ref 0.0–0.2)

## 2019-04-07 LAB — SAMPLE TO BLOOD BANK

## 2019-04-07 MED ORDER — IRON SUCROSE 20 MG/ML IV SOLN
200.0000 mg | Freq: Once | INTRAVENOUS | Status: AC
  Administered 2019-04-07: 200 mg via INTRAVENOUS
  Filled 2019-04-07: qty 10

## 2019-04-07 MED ORDER — SODIUM CHLORIDE 0.9 % IV SOLN
Freq: Once | INTRAVENOUS | Status: AC
  Filled 2019-04-07: qty 250

## 2019-04-07 NOTE — Progress Notes (Signed)
[place] NOTE  Patient Care Team: [name], MD as PCP - General (Internal Medicine)  CHIEF COMPLAINTS/PURPOSE OF CONSULTATION: SMALL CELL LYMPHOMA/CLL  #  Oncology History Overview Note  # October 2019- SMALL CELL LYMPHOMA; ki-67-10%. STAGE- III/ IV; PET scan- bulky LN [Dr.Vaught] above/below Diaphragm; No spleen/liver/bone;   # 22nd October 2019- Bil PE on lovenox xstopped; NOV 2019- Eliquis 5 mg BID. AUG 2020-slight incresae in RP LN ~1cm; continue Ibrutinib;  # worsening Anemia- Jan 2021- Hb 8.5; STOP eliquis; FOBT- positive; referral to GI/pt declines  # DM/HTN/ CKD- stage III -----------------------------------------------   MOLECULAR TESTING: Trisomy 12. 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  Patient wife available at the appointment today.  [name] 69 y.o.  male above diagnosis of of SLL/CLL on ibrutinib; incidental bilateral PE on currently off Eliquis [secondary to worsening anemia] is here for follow-up.   Patient continues to deny any blood in stools or black or stools.  Complains of mild fatigue.  Otherwise fairly active in the yard.    Patient continues to be reluctant/declining GI evaluation.  Review of Systems  Constitutional: Positive for malaise/fatigue. Negative for chills, diaphoresis, fever and weight loss.  HENT: Negative for sore throat.   Eyes: Negative for double vision.  Respiratory: Negative for cough, hemoptysis, sputum production, shortness of breath and wheezing.   Cardiovascular: Negative for chest pain, palpitations,  orthopnea and leg swelling.  Gastrointestinal: Negative for abdominal pain, blood in stool, constipation, diarrhea, heartburn and melena.  Genitourinary: Negative for dysuria, frequency and urgency.  Musculoskeletal: Positive for back pain and joint pain.  Skin: Negative.  Negative for itching and rash.  Neurological: Negative for dizziness, tingling, focal weakness, weakness and headaches.  Endo/Heme/Allergies: Bruises/bleeds easily.  Psychiatric/Behavioral: Negative for depression. The patient is not nervous/anxious and does not have insomnia.      MEDICAL HISTORY:  Past Medical History:  Diagnosis Date  . Anxiety   . Arthritis   . 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 HOH (hard of hearing)   . Hypertension   . Osteomyelitis of toe ([place]) 06/08/11   right foot  . Seasonal allergies   . Type II diabetes mellitus ([place])    diet controlled    SURGICAL HISTORY: Past Surgical History:  Procedure Laterality Date  . AMPUTATION  06/08/2011   Procedure: AMPUTATION RAY;  Surgeon: Wylene Simmer, MD;  Location: [facility];  Service: Orthopedics;  Laterality: Right;  [place] Amputation  . [place]   "crushed"  . FRACTURE SURGERY    . 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 PILONIDAL CYST / SINUS EXCISION  1970's  . TOE AMPUTATION  06/08/11   partial; right great toe    SOCIAL HISTORY: Social History   Socioeconomic History  . Marital status: Married    Spouse name: Not on file  . Number of children: Not on file  . Years of education: Not on file  . Highest education level: Not on file  Occupational History  . Not on file  Tobacco Use  . Smoking status: Former Smoker    Packs/day: 1.50    Years: 22.00    Pack years: 33.00    Types: Cigarettes    Quit date: 05/24/2011    Years since quitting: 7.8  . Smokeless tobacco: Former Systems developer    Types: [place] date: 03/07/1983  Substance and Sexual Activity  . Alcohol use: Yes    Comment: 06/08/11 "no liquor  for 2 1/2 years; last beer 05/31/11"  . Drug use: No  . Sexual activity: Yes  Other Topics Concern  . Not on file  Social History Narrative   Married   Social Determinants of Health   Financial Resource Strain:   . Difficulty of Paying Living Expenses: Not on file  Food Insecurity:   . Worried About Charity fundraiser in the Last Year: Not on file  . Ran Out of Food in the Last Year: Not on file  Transportation Needs:   . Lack of Transportation (Medical): Not on file  . Lack of Transportation (Non-Medical): Not on file  Physical Activity:   . Days of Exercise per Week: Not on file  . Minutes of Exercise per Session: Not on file  Stress:   . Feeling of Stress : Not on file  Social Connections:   . Frequency of Communication with Friends and Family: Not on file  . Frequency of Social Gatherings with Friends and Family: Not on file  . Attends Religious Services: Not on file  . Active Member of Clubs or Organizations: Not on file  . Attends Archivist Meetings: Not on file  . Marital Status: Not on file  Intimate Partner Violence:   . Fear of Current or Ex-Partner: Not on file  . Emotionally Abused: Not on file  . Physically Abused: Not on file  . Sexually Abused: Not on file    FAMILY HISTORY: Family History  Problem Relation Age of Onset  . Prostate cancer Father   . Hypertension Father   . Other Mother        VARICOSE VEINS    ALLERGIES:  is allergic to penicillins.  MEDICATIONS:  Current Outpatient Medications  Medication Sig Dispense Refill  . allopurinol (ZYLOPRIM) 300 MG tablet Take 1 tablet (300 mg total) by mouth 2 (two) times daily. 180 tablet 0  . amLODipine (NORVASC) 5 MG tablet Take 1 tablet (5 mg total) by mouth daily. 90 tablet 0  . ECHINACEA PO Take 760 mg by mouth daily.    . fluticasone (FLONASE) 50 MCG/ACT nasal spray Place 2 sprays into both nostrils daily as needed for allergies or rhinitis.    . Ginseng 100 MG CAPS Take 100 mg by  mouth daily.    . IMBRUVICA 420 MG TABS TAKE 1 TABLET (420 MG) BY MOUTH DAILY. 28 tablet 3  . Iron-Vitamin C 65-125 MG TABS Take 1 tablet by mouth.    Marland Kitchen JANUVIA 100 MG tablet Take 100 mg by mouth daily.  3  . lisinopril (PRINIVIL,ZESTRIL) 40 MG tablet Take 40 mg by mouth daily.    . metFORMIN (GLUCOPHAGE) 500 MG tablet Take 1,000 mg by mouth 2 (two) times daily.    . mupirocin ointment (BACTROBAN) 2 % APPLY WITH Q TIP THREE TIMES DAILY AS NEEDED FOR DRYNESS AND CRUSTING  3  . Omega-3 Fatty Acids (OMEGA 3 500) 500 MG CAPS Take 500 mg by mouth daily.    Marland Kitchen  ondansetron (ZOFRAN) 4 MG tablet TAKE 1 TABLET BY MOUTH EVERY 8 HOURS AS NEEDED FOR NAUSEA FOR VOMITING 20 tablet 2  . ONE TOUCH ULTRA TEST test strip USE 1 STRIP TO CHECK GLUCOSE ONCE A WEEK  99  . pravastatin (PRAVACHOL) 40 MG tablet Take 40 mg by mouth daily.    . Saw Palmetto 450 MG CAPS Take 450 mg by mouth daily.    Marland Kitchen apixaban (ELIQUIS) 5 MG TABS tablet Take 1 tablet (5 mg total) by mouth 2 (two) times daily. (Patient not taking: Reported on 04/04/2019) 180 tablet 1   No current facility-administered medications for this visit.      Marland Kitchen  PHYSICAL EXAMINATION: ECOG PERFORMANCE STATUS: 1 - Symptomatic but completely ambulatory  Vitals:   04/07/19 1352  BP: (!) 171/66  Pulse: 88  Temp: 97.6 F (36.4 C)   Filed Weights   04/07/19 1352  Weight: 189 lb 9.6 oz (86 kg)    Physical Exam  Constitutional: He is oriented to person, place, and time and well-developed, well-nourished, and in no distress.  Alone.  HENT:  Head: Normocephalic and atraumatic.  Mouth/Throat: Oropharynx is clear and moist. No oropharyngeal exudate.  Eyes: Pupils are equal, round, and reactive to light.  Cardiovascular: Normal rate and regular rhythm.  Pulmonary/Chest: No respiratory distress. He has no wheezes.  Abdominal: Soft. Bowel sounds are normal. He exhibits no distension and no mass. There is no abdominal tenderness. There is no rebound and no  guarding.  Musculoskeletal:        General: No tenderness or edema. Normal range of motion.     Cervical back: Normal range of motion and neck supple.  Lymphadenopathy:  Significant improvement of the lymphadenopathy in the neck/resolved; scattered approximately 1 cm few lymph nodes present in the right left underarm.  Neurological: He is alert and oriented to person, place, and time.  Skin: Skin is warm.  Psychiatric: Affect normal.     LABORATORY DATA:  I have reviewed the data as listed Lab Results  Component Value Date   WBC 8.9 04/07/2019   HGB 8.4 (L) 04/07/2019   HCT 28.8 (L) 04/07/2019   MCV 77.8 (L) 04/07/2019   PLT 335 04/07/2019   Recent Labs    02/24/19 1342 03/24/19 1354 04/07/19 1326  NA 137 137 135  K 4.0 4.1 4.2  CL 102 103 100  CO2 '28 26 26  ' GLUCOSE 169* 142* 186*  BUN 30* 24* 24*  CREATININE 1.44* 1.39* 1.38*  CALCIUM 9.6 9.5 9.5  GFRNONAA 50* 52* 53*  GFRAA 58* >60 >60  PROT 6.6 6.7 6.2*  ALBUMIN 3.4* 3.3* 3.2*  AST 12* 12* 12*  ALT '9 10 10  ' ALKPHOS 58 58 57  BILITOT 0.4 0.4 0.5    RADIOGRAPHIC STUDIES: I have personally reviewed the radiological images as listed and agreed with the findings in the report. No results found.  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  IGVH mutated. On Ibrutinib 420 mg/day; CT nov 2020- CT C/A/P- STABLE ~4 cm/bulky retroperitoneal adenopathy.   # Continue Imbruvica at this time; tolerating fairly well; except  hemoglobin today 8.4/see below. White count-12; platelets 335.    #Anemia-hemoglobin 8.4- worse -iron deficiency/ FOBT positive. Reluctant/declines with GI evaluation.  Again discussed at length regarding importance of GI evaluation.  Proceed with Venofer today.  #Bilateral PE [sep 2019]-HELD Eliquis [jan 18th 2020- held- sec  to worsening anemia].     # HTN -384T-X systolic.  However as per family blood pressure  at home 130-s-140s sec to  Ibrutinib; continue lisinopril 40 mg a day/  norvasc 5 mg/day. STABLE.   # I spoke at length with the patient's family, Hoyle Sauer- regarding the patient's clinical status/plan of care. Again- reminded/recommend call GI office ASAP  DISPOSITION: # proceed with venofer today # follow up in 2  weeks MD; labs- cbc/cmp; HOLD tube; possible Venofer; Dr.B    All questions were answered. The patient knows to call the clinic with any problems, questions or concerns.     Cammie Sickle, MD 04/08/2019 8:21 AM

## 2019-04-07 NOTE — Assessment & Plan Note (Addendum)
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 On Ibrutinib 420 mg/day; CT nov 2020- CT C/A/P- STABLE ~4 cm/bulky retroperitoneal adenopathy.   # Continue Imbruvica at this time; tolerating fairly well; except  hemoglobin today 8.4/see below. White count-12; platelets 335.    #Anemia-hemoglobin 8.4- worse -iron deficiency/ FOBT positive. Reluctant/declines with GI evaluation.  Again discussed at length regarding importance of GI evaluation.  Proceed with Venofer today.  #Bilateral PE [sep 2019]-HELD Eliquis [jan 18th 2020- held- sec to worsening anemia].     # HTN -[id number] systolic.  However as per family blood pressure at home 130-s-140s sec to  Ibrutinib; continue lisinopril 40 mg a day/  norvasc 5 mg/day. STABLE.   # I spoke at length with the patient's family, Hoyle Sauer- regarding the patient's clinical status/plan of care. Again- reminded/recommend call GI office ASAP  DISPOSITION: # proceed with venofer today # follow up in 2  weeks MD; labs- cbc/cmp; HOLD tube; possible Venofer; Dr.B

## 2019-04-09 ENCOUNTER — Other Ambulatory Visit: Payer: Self-pay | Admitting: *Deleted

## 2019-04-09 MED ORDER — AMLODIPINE BESYLATE 5 MG PO TABS: 5.0000 mg | ORAL_TABLET | Freq: Every day | ORAL | 1 refills | Status: DC

## 2019-04-21 ENCOUNTER — Other Ambulatory Visit: Payer: Self-pay

## 2019-04-21 NOTE — Progress Notes (Signed)
Per patient's wife, patient has new onset of urinary incontinence s/p last iv venofer. Patient reports intermittent constipation. Patient d/c the Eliquis and the allopurinol as directed by Dr. Rogue Bussing. Patient's BP has been stable per wife systolic [id number]. Patient remains reluctant to go to the GI referral. He refuses to be worked up due to "fear of reactions to anesthesia."

## 2019-04-22 ENCOUNTER — Inpatient Hospital Stay: Payer: Medicare Other

## 2019-04-22 ENCOUNTER — Inpatient Hospital Stay (HOSPITAL_BASED_OUTPATIENT_CLINIC_OR_DEPARTMENT_OTHER): Payer: Medicare Other | Admitting: Internal Medicine

## 2019-04-22 ENCOUNTER — Other Ambulatory Visit: Payer: Self-pay

## 2019-04-22 DIAGNOSIS — D500 Iron deficiency anemia secondary to blood loss (chronic): Secondary | ICD-10-CM

## 2019-04-22 DIAGNOSIS — C8308 Small cell B-cell lymphoma, lymph nodes of multiple sites: Secondary | ICD-10-CM | POA: Diagnosis not present

## 2019-04-22 DIAGNOSIS — D509 Iron deficiency anemia, unspecified: Secondary | ICD-10-CM | POA: Diagnosis not present

## 2019-04-22 LAB — SAMPLE TO BLOOD BANK

## 2019-04-22 LAB — COMPREHENSIVE METABOLIC PANEL
ALT: 9 U/L (ref 0–44)
AST: 11 U/L — ABNORMAL LOW (ref 15–41)
Albumin: 2.8 g/dL — ABNORMAL LOW (ref 3.5–5.0)
Alkaline Phosphatase: 52 U/L (ref 38–126)
Anion gap: 9 (ref 5–15)
BUN: 32 mg/dL — ABNORMAL HIGH (ref 8–23)
CO2: 25 mmol/L (ref 22–32)
Calcium: 10.4 mg/dL — ABNORMAL HIGH (ref 8.9–10.3)
Chloride: 102 mmol/L (ref 98–111)
Creatinine, Ser: 1.43 mg/dL — ABNORMAL HIGH (ref 0.61–1.24)
GFR calc Af Amer: 58 mL/min — ABNORMAL LOW (ref >60)
GFR calc non Af Amer: 50 mL/min — ABNORMAL LOW (ref >60)
Glucose, Bld: 197 mg/dL — ABNORMAL HIGH (ref 70–99)
Potassium: 4.3 mmol/L (ref 3.5–5.1)
Sodium: 136 mmol/L (ref 135–145)
Total Bilirubin: 0.3 mg/dL (ref 0.3–1.2)
Total Protein: 6 g/dL — ABNORMAL LOW (ref 6.5–8.1)

## 2019-04-22 LAB — CBC WITH DIFFERENTIAL/PLATELET
Abs Immature Granulocytes: 0.1 10*3/uL — ABNORMAL HIGH (ref 0.00–0.07)
Basophils Absolute: 0.1 10*3/uL (ref 0.0–0.1)
Basophils Relative: 1 %
Eosinophils Absolute: 0 10*3/uL (ref 0.0–0.5)
Eosinophils Relative: 0 %
HCT: 27.9 % — ABNORMAL LOW (ref 39.0–52.0)
Hemoglobin: 8.1 g/dL — ABNORMAL LOW (ref 13.0–17.0)
Immature Granulocytes: 1 %
Lymphocytes Relative: 11 %
Lymphs Abs: 1.2 10*3/uL (ref 0.7–4.0)
MCH: 22 pg — ABNORMAL LOW (ref 26.0–34.0)
MCHC: 29 g/dL — ABNORMAL LOW (ref 30.0–36.0)
MCV: 75.8 fL — ABNORMAL LOW (ref 80.0–100.0)
Monocytes Absolute: 1.5 10*3/uL — ABNORMAL HIGH (ref 0.1–1.0)
Monocytes Relative: 15 %
Neutro Abs: 7.5 10*3/uL (ref 1.7–7.7)
Neutrophils Relative %: 72 %
Platelets: 452 10*3/uL — ABNORMAL HIGH (ref 150–400)
RBC: 3.68 MIL/uL — ABNORMAL LOW (ref 4.22–5.81)
RDW: 16.1 % — ABNORMAL HIGH (ref 11.5–15.5)
Smear Review: ADEQUATE
WBC: 10.5 10*3/uL (ref 4.0–10.5)
nRBC: 0 % (ref 0.0–0.2)

## 2019-04-22 MED ORDER — SODIUM CHLORIDE 0.9 % IV SOLN
Freq: Once | INTRAVENOUS | Status: AC
  Filled 2019-04-22: qty 250

## 2019-04-22 MED ORDER — IRON SUCROSE 20 MG/ML IV SOLN
200.0000 mg | Freq: Once | INTRAVENOUS | Status: AC
  Administered 2019-04-22: 200 mg via INTRAVENOUS
  Filled 2019-04-22: qty 10

## 2019-04-22 NOTE — Assessment & Plan Note (Addendum)
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 On Ibrutinib 420 mg/day; CT nov 2020- CT C/A/P- STABLE ~4 cm/bulky retroperitoneal adenopathy.   # Continue Imbruvica at this time; tolerating fairly well; except  hemoglobin today 8.1/see below; N- wbc/platelets.     #Anemia-hemoglobin 8.1- worse -iron deficiency/ FOBT positive; recommend follow up with GI/Dr.Wohl [ASAP]; pt/wife will call re: holding iburitnib prior to GI procedures.   #Bilateral PE [sep 2019]-HELD Eliquis [jan 18th 2020- held- sec to worsening anemia].  See above.    # HTN -well controlled at home.   # Prothesis sore-defer to PCP/prosthetist.  DISPOSITION: # proceed with venofer today # follow up in 2  weeks MD; labs- cbc/cmp; HOLD tube; possible Venofer; Dr.B

## 2019-04-22 NOTE — Progress Notes (Signed)
[facility] NOTE  Patient Care Team: [name], MD as PCP - General (Internal Medicine)  CHIEF COMPLAINTS/PURPOSE OF CONSULTATION: SMALL CELL LYMPHOMA/CLL  #  Oncology History Overview Note  # October 2019- SMALL CELL LYMPHOMA; ki-67-10%. STAGE- III/ IV; PET scan- bulky LN [Dr.Vaught] above/below Diaphragm; No spleen/liver/bone;   # 22nd October 2019- Bil PE on lovenox xstopped; NOV 2019- Eliquis 5 mg BID. AUG 2020-slight incresae in RP LN ~1cm; continue Ibrutinib;  # worsening Anemia- Jan 2021- Hb 8.5; STOP eliquis; FOBT- positive; referral to GI/pt declines  # DM/HTN/ CKD- stage III -----------------------------------------------   MOLECULAR TESTING: Trisomy 12. 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  Patient wife available at the appointment today.  [name] 69 y.o.  male above diagnosis of of SLL/CLL on ibrutinib; incidental bilateral PE on currently off Eliquis [secondary to worsening anemia] is here for follow-up.   Patient continues to decline doing well up with GI.   Concerned about a pressure sore on his prosthetic leg.   No blood in stools or black stools.  Mild fatigue otherwise fairly active.   Review of Systems  Constitutional: Positive for malaise/fatigue. Negative for chills, diaphoresis, fever and weight loss.  HENT: Negative for sore throat.   Eyes: Negative for double vision.  Respiratory: Negative for cough, hemoptysis, sputum production, shortness of breath and wheezing.   Cardiovascular: Negative for chest pain, palpitations,  orthopnea and leg swelling.  Gastrointestinal: Negative for abdominal pain, blood in stool, constipation, diarrhea, heartburn and melena.  Genitourinary: Negative for dysuria, frequency and urgency.  Musculoskeletal: Positive for back pain and joint pain.  Skin: Negative.  Negative for itching and rash.  Neurological: Negative for dizziness, tingling, focal weakness, weakness and headaches.  Endo/Heme/Allergies: Bruises/bleeds easily.  Psychiatric/Behavioral: Negative for depression. The patient is not nervous/anxious and does not have insomnia.      MEDICAL HISTORY:  Past Medical History:  Diagnosis Date  . Anxiety   . Arthritis   . 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 HOH (hard of hearing)   . Hypertension   . Osteomyelitis of toe ([place]) 06/08/11   right foot  . Seasonal allergies   . Type II diabetes mellitus ([place])    diet controlled    SURGICAL HISTORY: Past Surgical History:  Procedure Laterality Date  . AMPUTATION  06/08/2011   Procedure: AMPUTATION RAY;  Surgeon: Wylene Simmer, MD;  Location: [facility];  Service: Orthopedics;  Laterality: Right;  Righ Hallux Amputation  . [place]   "crushed"  . FRACTURE SURGERY    . 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 PILONIDAL CYST / SINUS EXCISION  1970's  . TOE AMPUTATION  06/08/11   partial; right great toe    SOCIAL HISTORY: Social History   Socioeconomic History  . Marital status: Married    Spouse name: Not on file  . Number of children: Not on file  . Years of education: Not on file  . Highest education level: Not on  file  Occupational History  . Not on file  Tobacco Use  . Smoking status: Former Smoker    Packs/day: 1.50    Years: 22.00    Pack years: 33.00    Types: Cigarettes    Quit date: 05/24/2011    Years since quitting: 7.9  . Smokeless tobacco: Former Systems developer    Types: [place] date: 03/07/1983  Substance and Sexual Activity  . Alcohol use: Yes    Comment: 06/08/11 "no liquor  for 2 1/2 years; last beer 05/31/11"  . Drug use: No  . Sexual activity: Yes  Other Topics Concern  . Not on file  Social History Narrative   Married   Social Determinants of Health   Financial Resource Strain:   . Difficulty of Paying Living Expenses: Not on file  Food Insecurity:   . Worried About Charity fundraiser in the Last Year: Not on file  . Ran Out of Food in the Last Year: Not on file  Transportation Needs:   . Lack of Transportation (Medical): Not on file  . Lack of Transportation (Non-Medical): Not on file  Physical Activity:   . Days of Exercise per Week: Not on file  . Minutes of Exercise per Session: Not on file  Stress:   . Feeling of Stress : Not on file  Social Connections:   . Frequency of Communication with Friends and Family: Not on file  . Frequency of Social Gatherings with Friends and Family: Not on file  . Attends Religious Services: Not on file  . Active Member of Clubs or Organizations: Not on file  . Attends Archivist Meetings: Not on file  . Marital Status: Not on file  Intimate Partner Violence:   . Fear of Current or Ex-Partner: Not on file  . Emotionally Abused: Not on file  . Physically Abused: Not on file  . Sexually Abused: Not on file    FAMILY HISTORY: Family History  Problem Relation Age of Onset  . Prostate cancer Father   . Hypertension Father   . Other Mother        VARICOSE VEINS    ALLERGIES:  is allergic to penicillins.  MEDICATIONS:  Current Outpatient Medications  Medication Sig Dispense Refill  . amLODipine (NORVASC) 5 MG tablet Take 1 tablet (5 mg total) by mouth daily. 90 tablet 1  . ECHINACEA PO Take 760 mg by mouth daily.    . fluticasone (FLONASE) 50 MCG/ACT nasal spray Place 2 sprays into both nostrils daily as needed for allergies or rhinitis.    . IMBRUVICA 420 MG TABS TAKE 1 TABLET (420 MG) BY MOUTH DAILY. 28 tablet 3  . Iron-Vitamin C 65-125 MG TABS Take 1 tablet by mouth. VITRON C    .  JANUVIA 100 MG tablet Take 100 mg by mouth daily.  3  . lisinopril (PRINIVIL,ZESTRIL) 40 MG tablet Take 40 mg by mouth daily.    . metFORMIN (GLUCOPHAGE) 500 MG tablet Take 1,000 mg by mouth 2 (two) times daily.    . mupirocin ointment (BACTROBAN) 2 % APPLY WITH Q TIP THREE TIMES DAILY AS NEEDED FOR DRYNESS AND CRUSTING  3  . Omega-3 Fatty Acids (OMEGA 3 500) 500 MG CAPS Take 500 mg by mouth daily.    . ondansetron (ZOFRAN) 4 MG tablet TAKE 1 TABLET BY MOUTH EVERY 8 HOURS AS NEEDED FOR NAUSEA FOR VOMITING 20 tablet 2  . ONE TOUCH ULTRA TEST test strip USE 1  STRIP TO CHECK GLUCOSE ONCE A WEEK  99  . pravastatin (PRAVACHOL) 40 MG tablet Take 40 mg by mouth daily.    . Saw Palmetto 450 MG CAPS Take 450 mg by mouth daily.    Marland Kitchen allopurinol (ZYLOPRIM) 300 MG tablet Take 1 tablet (300 mg total) by mouth 2 (two) times daily. (Patient not taking: Reported on 04/21/2019) 180 tablet 0  . apixaban (ELIQUIS) 5 MG TABS tablet Take 1 tablet (5 mg total) by mouth 2 (two) times daily. (Patient not taking: Reported on 04/04/2019) 180 tablet 1  . Ginseng 100 MG CAPS Take 100 mg by mouth daily.     No current facility-administered medications for this visit.   Facility-Administered Medications Ordered in Other Visits  Medication Dose Route Frequency Provider Last Rate Last Admin  . 0.9 %  sodium chloride infusion   Intravenous Once Charlaine Dalton R, MD      . iron sucrose (VENOFER) injection 200 mg  200 mg Intravenous Once Charlaine Dalton R, MD          .  PHYSICAL EXAMINATION: ECOG PERFORMANCE STATUS: 1 - Symptomatic but completely ambulatory  Vitals:   04/22/19 1315  BP: (!) 144/72  Pulse: 90  Resp: 20  Temp: (!) 97.4 F (36.3 C)   Filed Weights   04/22/19 1324  Weight: 185 lb (83.9 kg)    Physical Exam  Constitutional: He is oriented to person, place, and time and well-developed, well-nourished, and in no distress.  Alone.  HENT:  Head: Normocephalic and atraumatic.  Mouth/Throat:  Oropharynx is clear and moist. No oropharyngeal exudate.  Eyes: Pupils are equal, round, and reactive to light.  Cardiovascular: Normal rate and regular rhythm.  Pulmonary/Chest: No respiratory distress. He has no wheezes.  Abdominal: Soft. Bowel sounds are normal. He exhibits no distension and no mass. There is no abdominal tenderness. There is no rebound and no guarding.  Musculoskeletal:        General: No tenderness or edema. Normal range of motion.     Cervical back: Normal range of motion and neck supple.  Lymphadenopathy:  Significant improvement of the lymphadenopathy in the neck/resolved; scattered approximately 1 cm few lymph nodes present in the right left underarm.  Neurological: He is alert and oriented to person, place, and time.  Skin: Skin is warm.  Psychiatric: Affect normal.     LABORATORY DATA:  I have reviewed the data as listed Lab Results  Component Value Date   WBC PENDING 04/22/2019   HGB 8.1 (L) 04/22/2019   HCT 27.9 (L) 04/22/2019   MCV 75.8 (L) 04/22/2019   PLT 452 (H) 04/22/2019   Recent Labs    03/24/19 1354 04/07/19 1326 04/22/19 1303  NA 137 135 136  K 4.1 4.2 4.3  CL 103 100 102  CO2 '26 26 25  ' GLUCOSE 142* 186* 197*  BUN 24* 24* 32*  CREATININE 1.39* 1.38* 1.43*  CALCIUM 9.5 9.5 10.4*  GFRNONAA 52* 53* 50*  GFRAA >60 >60 58*  PROT 6.7 6.2* 6.0*  ALBUMIN 3.3* 3.2* 2.8*  AST 12* 12* 11*  ALT '10 10 9  ' ALKPHOS 58 57 52  BILITOT 0.4 0.5 0.3    RADIOGRAPHIC STUDIES: I have personally reviewed the radiological images as listed and agreed with the findings in the report. No results found.  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  IGVH mutated. On Ibrutinib 420  mg/day; CT nov 2020- CT C/A/P- STABLE ~4 cm/bulky retroperitoneal adenopathy.   # Continue Imbruvica at this time; tolerating fairly well; except  hemoglobin today 8.1/see below; N-  wbc/platelets.     #Anemia-hemoglobin 8.1- worse -iron deficiency/ FOBT positive; recommend follow up with GI/Dr.Wohl [ASAP]; pt/wife will call re: holding iburitnib prior to GI procedures.   #Bilateral PE [sep 2019]-HELD Eliquis [jan 18th 2020- held- sec to worsening anemia].  See above.    # HTN -well controlled at home.   # Prothesis sore-defer to PCP/prosthetist.  DISPOSITION: # proceed with venofer today # follow up in 2  weeks MD; labs- cbc/cmp; HOLD tube; possible Venofer; Dr.B    All questions were answered. The patient knows to call the clinic with any problems, questions or concerns.     Cammie Sickle, MD 04/22/2019 1:44 PM

## 2019-04-23 MED FILL — IMBRUVICA 420 MG TAB: 420 | 28 days supply | Qty: 28 | Fill #1

## 2019-05-06 ENCOUNTER — Encounter: Payer: Self-pay | Admitting: Internal Medicine

## 2019-05-06 ENCOUNTER — Emergency Department: Payer: Medicare Other

## 2019-05-06 ENCOUNTER — Inpatient Hospital Stay: Payer: Medicare Other | Attending: Internal Medicine

## 2019-05-06 ENCOUNTER — Inpatient Hospital Stay (HOSPITAL_BASED_OUTPATIENT_CLINIC_OR_DEPARTMENT_OTHER): Payer: Medicare Other | Admitting: Internal Medicine

## 2019-05-06 ENCOUNTER — Inpatient Hospital Stay: Payer: Medicare Other

## 2019-05-06 ENCOUNTER — Other Ambulatory Visit: Payer: Self-pay

## 2019-05-06 ENCOUNTER — Inpatient Hospital Stay
Admission: EM | Admit: 2019-05-06 | Discharge: 2019-05-09 | DRG: 842 | Disposition: A | Discharge status: Home Health | Payer: Medicare Other | Attending: Internal Medicine | Admitting: Internal Medicine

## 2019-05-06 VITALS — BP 173/60 | HR 96 | Temp 101.9°F

## 2019-05-06 DIAGNOSIS — H9190 Unspecified hearing loss, unspecified ear: Secondary | ICD-10-CM | POA: Diagnosis present

## 2019-05-06 DIAGNOSIS — Z7982 Long term (current) use of aspirin: Secondary | ICD-10-CM

## 2019-05-06 DIAGNOSIS — Z8249 Family history of ischemic heart disease and other diseases of the circulatory system: Secondary | ICD-10-CM

## 2019-05-06 DIAGNOSIS — I2699 Other pulmonary embolism without acute cor pulmonale: Secondary | ICD-10-CM

## 2019-05-06 DIAGNOSIS — Z880 Allergy status to penicillin: Secondary | ICD-10-CM

## 2019-05-06 DIAGNOSIS — R451 Restlessness and agitation: Secondary | ICD-10-CM | POA: Diagnosis present

## 2019-05-06 DIAGNOSIS — D509 Iron deficiency anemia, unspecified: Secondary | ICD-10-CM | POA: Insufficient documentation

## 2019-05-06 DIAGNOSIS — R32 Unspecified urinary incontinence: Secondary | ICD-10-CM | POA: Diagnosis present

## 2019-05-06 DIAGNOSIS — Z89411 Acquired absence of right great toe: Secondary | ICD-10-CM

## 2019-05-06 DIAGNOSIS — Z7984 Long term (current) use of oral hypoglycemic drugs: Secondary | ICD-10-CM | POA: Diagnosis not present

## 2019-05-06 DIAGNOSIS — R410 Disorientation, unspecified: Secondary | ICD-10-CM

## 2019-05-06 DIAGNOSIS — E1122 Type 2 diabetes mellitus with diabetic chronic kidney disease: Secondary | ICD-10-CM | POA: Diagnosis present

## 2019-05-06 DIAGNOSIS — F419 Anxiety disorder, unspecified: Secondary | ICD-10-CM | POA: Diagnosis present

## 2019-05-06 DIAGNOSIS — I129 Hypertensive chronic kidney disease with stage 1 through stage 4 chronic kidney disease, or unspecified chronic kidney disease: Secondary | ICD-10-CM | POA: Diagnosis present

## 2019-05-06 DIAGNOSIS — Z20822 Contact with and (suspected) exposure to covid-19: Secondary | ICD-10-CM | POA: Diagnosis present

## 2019-05-06 DIAGNOSIS — D500 Iron deficiency anemia secondary to blood loss (chronic): Secondary | ICD-10-CM | POA: Diagnosis present

## 2019-05-06 DIAGNOSIS — N1831 Chronic kidney disease, stage 3a: Secondary | ICD-10-CM | POA: Diagnosis present

## 2019-05-06 DIAGNOSIS — E785 Hyperlipidemia, unspecified: Secondary | ICD-10-CM | POA: Diagnosis present

## 2019-05-06 DIAGNOSIS — N183 Chronic kidney disease, stage 3 unspecified: Secondary | ICD-10-CM | POA: Insufficient documentation

## 2019-05-06 DIAGNOSIS — Z79899 Other long term (current) drug therapy: Secondary | ICD-10-CM

## 2019-05-06 DIAGNOSIS — Z7901 Long term (current) use of anticoagulants: Secondary | ICD-10-CM

## 2019-05-06 DIAGNOSIS — C8308 Small cell B-cell lymphoma, lymph nodes of multiple sites: Secondary | ICD-10-CM

## 2019-05-06 DIAGNOSIS — Z89512 Acquired absence of left leg below knee: Secondary | ICD-10-CM | POA: Diagnosis not present

## 2019-05-06 DIAGNOSIS — R296 Repeated falls: Secondary | ICD-10-CM | POA: Diagnosis present

## 2019-05-06 DIAGNOSIS — R4182 Altered mental status, unspecified: Secondary | ICD-10-CM | POA: Diagnosis present

## 2019-05-06 DIAGNOSIS — R509 Fever, unspecified: Secondary | ICD-10-CM | POA: Insufficient documentation

## 2019-05-06 DIAGNOSIS — Z993 Dependence on wheelchair: Secondary | ICD-10-CM

## 2019-05-06 DIAGNOSIS — Z87891 Personal history of nicotine dependence: Secondary | ICD-10-CM

## 2019-05-06 DIAGNOSIS — R590 Localized enlarged lymph nodes: Secondary | ICD-10-CM | POA: Diagnosis present

## 2019-05-06 DIAGNOSIS — D899 Disorder involving the immune mechanism, unspecified: Secondary | ICD-10-CM | POA: Diagnosis present

## 2019-05-06 DIAGNOSIS — Z91048 Other nonmedicinal substance allergy status: Secondary | ICD-10-CM | POA: Diagnosis not present

## 2019-05-06 DIAGNOSIS — S88119A Complete traumatic amputation at level between knee and ankle, unspecified lower leg, initial encounter: Secondary | ICD-10-CM | POA: Diagnosis not present

## 2019-05-06 DIAGNOSIS — N179 Acute kidney failure, unspecified: Secondary | ICD-10-CM | POA: Diagnosis present

## 2019-05-06 DIAGNOSIS — Z86711 Personal history of pulmonary embolism: Secondary | ICD-10-CM

## 2019-05-06 LAB — COMPREHENSIVE METABOLIC PANEL
ALT: 9 U/L (ref 0–44)
AST: 12 U/L — ABNORMAL LOW (ref 15–41)
Albumin: 2.8 g/dL — ABNORMAL LOW (ref 3.5–5.0)
Alkaline Phosphatase: 54 U/L (ref 38–126)
Anion gap: 12 (ref 5–15)
BUN: 34 mg/dL — ABNORMAL HIGH (ref 8–23)
CO2: 24 mmol/L (ref 22–32)
Calcium: 11.3 mg/dL — ABNORMAL HIGH (ref 8.9–10.3)
Chloride: 100 mmol/L (ref 98–111)
Creatinine, Ser: 1.5 mg/dL — ABNORMAL HIGH (ref 0.61–1.24)
GFR calc Af Amer: 55 mL/min — ABNORMAL LOW (ref >60)
GFR calc non Af Amer: 47 mL/min — ABNORMAL LOW (ref >60)
Glucose, Bld: 162 mg/dL — ABNORMAL HIGH (ref 70–99)
Potassium: 4.5 mmol/L (ref 3.5–5.1)
Sodium: 136 mmol/L (ref 135–145)
Total Bilirubin: 0.4 mg/dL (ref 0.3–1.2)
Total Protein: 6.2 g/dL — ABNORMAL LOW (ref 6.5–8.1)

## 2019-05-06 LAB — CBC WITH DIFFERENTIAL/PLATELET
Abs Immature Granulocytes: 0.09 10*3/uL — ABNORMAL HIGH (ref 0.00–0.07)
Abs Immature Granulocytes: 0.08 10*3/uL — ABNORMAL HIGH (ref 0.00–0.07)
Basophils Absolute: 0.1 10*3/uL (ref 0.0–0.1)
Basophils Absolute: 0.1 10*3/uL (ref 0.0–0.1)
Basophils Relative: 1 %
Basophils Relative: 1 %
Eosinophils Absolute: 0 10*3/uL (ref 0.0–0.5)
Eosinophils Absolute: 0 10*3/uL (ref 0.0–0.5)
Eosinophils Relative: 0 %
Eosinophils Relative: 0 %
HCT: 29.3 % — ABNORMAL LOW (ref 39.0–52.0)
HCT: 25.7 % — ABNORMAL LOW (ref 39.0–52.0)
Hemoglobin: 8.3 g/dL — ABNORMAL LOW (ref 13.0–17.0)
Hemoglobin: 7.7 g/dL — ABNORMAL LOW (ref 13.0–17.0)
Immature Granulocytes: 1 %
Immature Granulocytes: 1 %
Lymphocytes Relative: 15 %
Lymphocytes Relative: 13 %
Lymphs Abs: 1.3 10*3/uL (ref 0.7–4.0)
Lymphs Abs: 0.9 10*3/uL (ref 0.7–4.0)
MCH: 21.2 pg — ABNORMAL LOW (ref 26.0–34.0)
MCH: 21.9 pg — ABNORMAL LOW (ref 26.0–34.0)
MCHC: 28.3 g/dL — ABNORMAL LOW (ref 30.0–36.0)
MCHC: 30 g/dL (ref 30.0–36.0)
MCV: 74.7 fL — ABNORMAL LOW (ref 80.0–100.0)
MCV: 73.2 fL — ABNORMAL LOW (ref 80.0–100.0)
Monocytes Absolute: 1.3 10*3/uL — ABNORMAL HIGH (ref 0.1–1.0)
Monocytes Absolute: 1.2 10*3/uL — ABNORMAL HIGH (ref 0.1–1.0)
Monocytes Relative: 16 %
Monocytes Relative: 17 %
Neutro Abs: 5.8 10*3/uL (ref 1.7–7.7)
Neutro Abs: 4.7 10*3/uL (ref 1.7–7.7)
Neutrophils Relative %: 67 %
Neutrophils Relative %: 68 %
Platelets: 383 10*3/uL (ref 150–400)
Platelets: 313 10*3/uL (ref 150–400)
RBC: 3.92 MIL/uL — ABNORMAL LOW (ref 4.22–5.81)
RBC: 3.51 MIL/uL — ABNORMAL LOW (ref 4.22–5.81)
RDW: 16 % — ABNORMAL HIGH (ref 11.5–15.5)
RDW: 15.9 % — ABNORMAL HIGH (ref 11.5–15.5)
WBC: 8.6 10*3/uL (ref 4.0–10.5)
WBC: 6.9 10*3/uL (ref 4.0–10.5)
nRBC: 0 % (ref 0.0–0.2)
nRBC: 0 % (ref 0.0–0.2)

## 2019-05-06 LAB — BASIC METABOLIC PANEL
Anion gap: 10 (ref 5–15)
BUN: 31 mg/dL — ABNORMAL HIGH (ref 8–23)
CO2: 21 mmol/L — ABNORMAL LOW (ref 22–32)
Calcium: 10.6 mg/dL — ABNORMAL HIGH (ref 8.9–10.3)
Chloride: 103 mmol/L (ref 98–111)
Creatinine, Ser: 1.38 mg/dL — ABNORMAL HIGH (ref 0.61–1.24)
GFR calc Af Amer: >60 mL/min (ref >60)
GFR calc non Af Amer: 53 mL/min — ABNORMAL LOW (ref >60)
Glucose, Bld: 177 mg/dL — ABNORMAL HIGH (ref 70–99)
Potassium: 4.5 mmol/L (ref 3.5–5.1)
Sodium: 134 mmol/L — ABNORMAL LOW (ref 135–145)

## 2019-05-06 LAB — URINALYSIS, COMPLETE (UACMP) WITH MICROSCOPIC
Bacteria, UA: NONE SEEN
Bilirubin Urine: NEGATIVE
Glucose, UA: NEGATIVE mg/dL
Hgb urine dipstick: NEGATIVE
Ketones, ur: NEGATIVE mg/dL
Leukocytes,Ua: NEGATIVE
Nitrite: NEGATIVE
Protein, ur: 30 mg/dL — AB
Specific Gravity, Urine: 1.012 (ref 1.005–1.030)
Squamous Epithelial / HPF: NONE SEEN (ref 0–5)
pH: 5 (ref 5.0–8.0)

## 2019-05-06 LAB — HEPATIC FUNCTION PANEL
ALT: 11 U/L (ref 0–44)
AST: 14 U/L — ABNORMAL LOW (ref 15–41)
Albumin: 2.6 g/dL — ABNORMAL LOW (ref 3.5–5.0)
Alkaline Phosphatase: 47 U/L (ref 38–126)
Bilirubin, Direct: 0.2 mg/dL (ref 0.0–0.2)
Indirect Bilirubin: 0.6 mg/dL (ref 0.3–0.9)
Total Bilirubin: 0.8 mg/dL (ref 0.3–1.2)
Total Protein: 5.7 g/dL — ABNORMAL LOW (ref 6.5–8.1)

## 2019-05-06 LAB — TSH: TSH: 1.056 u[IU]/mL (ref 0.350–4.500)

## 2019-05-06 LAB — PHOSPHORUS: Phosphorus: 2.6 mg/dL (ref 2.5–4.6)

## 2019-05-06 LAB — AMMONIA: Ammonia: 23 umol/L (ref 9–35)

## 2019-05-06 LAB — SAMPLE TO BLOOD BANK

## 2019-05-06 LAB — MAGNESIUM: Magnesium: 1.2 mg/dL — ABNORMAL LOW (ref 1.7–2.4)

## 2019-05-06 LAB — TROPONIN I (HIGH SENSITIVITY)
Troponin I (High Sensitivity): 13 ng/L (ref <18)
Troponin I (High Sensitivity): 14 ng/L (ref <18)

## 2019-05-06 MED ORDER — INSULIN ASPART 100 UNIT/ML ~~LOC~~ SOLN
0.0000 [IU] | Freq: Three times a day (TID) | SUBCUTANEOUS | Status: DC
  Administered 2019-05-07 (×2): 1 [IU] via SUBCUTANEOUS
  Administered 2019-05-08 (×2): 2 [IU] via SUBCUTANEOUS
  Administered 2019-05-08: 3 [IU] via SUBCUTANEOUS
  Administered 2019-05-09: 2 [IU] via SUBCUTANEOUS
  Administered 2019-05-09: 3 [IU] via SUBCUTANEOUS
  Filled 2019-05-06 (×7): qty 1

## 2019-05-06 MED ORDER — LORAZEPAM 2 MG/ML IJ SOLN
1.0000 mg | Freq: Once | INTRAMUSCULAR | Status: AC
  Administered 2019-05-06: 1 mg via INTRAVENOUS
  Filled 2019-05-06: qty 1

## 2019-05-06 MED ORDER — ZOLEDRONIC ACID 4 MG/100ML IV SOLN
4.0000 mg | Freq: Once | INTRAVENOUS | Status: AC
  Administered 2019-05-06: 4 mg via INTRAVENOUS
  Filled 2019-05-06: qty 100

## 2019-05-06 MED ORDER — AMLODIPINE BESYLATE 5 MG PO TABS
5.0000 mg | ORAL_TABLET | Freq: Every day | ORAL | Status: DC
  Administered 2019-05-08 – 2019-05-09 (×2): 5 mg via ORAL
  Filled 2019-05-06 (×2): qty 1

## 2019-05-06 MED ORDER — SODIUM CHLORIDE 0.9 % IV SOLN
Freq: Once | INTRAVENOUS | Status: AC
  Filled 2019-05-06: qty 250

## 2019-05-06 MED ORDER — LORAZEPAM 2 MG/ML IJ SOLN: 1.0000 mg | Freq: Once | INTRAMUSCULAR | Status: DC

## 2019-05-06 MED ORDER — PRAVASTATIN SODIUM 40 MG PO TABS
40.0000 mg | ORAL_TABLET | Freq: Every day | ORAL | Status: DC
  Administered 2019-05-07 – 2019-05-08 (×2): 40 mg via ORAL
  Filled 2019-05-06 (×3): qty 1
  Filled 2019-05-06 (×2): qty 2

## 2019-05-06 MED ORDER — ASPIRIN 81 MG PO CHEW
81.0000 mg | CHEWABLE_TABLET | Freq: Every day | ORAL | Status: DC
  Administered 2019-05-07 – 2019-05-08 (×2): 81 mg via ORAL
  Filled 2019-05-06 (×2): qty 1

## 2019-05-06 MED ORDER — SODIUM CHLORIDE 0.9 % IV BOLUS
500.0000 mL | Freq: Once | INTRAVENOUS | Status: AC
  Administered 2019-05-07: 500 mL via INTRAVENOUS

## 2019-05-06 MED ORDER — VANCOMYCIN HCL IN DEXTROSE 1-5 GM/200ML-% IV SOLN
1000.0000 mg | Freq: Once | INTRAVENOUS | Status: AC
  Administered 2019-05-07: 1000 mg via INTRAVENOUS
  Filled 2019-05-06: qty 200

## 2019-05-06 MED ORDER — SODIUM CHLORIDE 0.9 % IV SOLN
2.0000 g | Freq: Once | INTRAVENOUS | Status: AC
  Administered 2019-05-07: 2 g via INTRAVENOUS
  Filled 2019-05-06: qty 2

## 2019-05-06 NOTE — ED Provider Notes (Signed)
[facility] Emergency Department Provider Note ____________________________________________   First MD Initiated Contact with Patient 05/06/19 1550     (approximate)  I have reviewed the triage vital signs and the nursing notes.   HISTORY  Chief Complaint Altered Mental Status  Level 5 caveat: History present illness limited due to altered mental status  HPI [name] is a 69 y.o. male with PMH as noted below including small cell lymphoma and CLL who presents with worsening altered mental status over approximately last week, manifested as increased confusion and weakness.  The wife reports that the patient has also been incontinent of urine.  He was seen at the cancer center today and sent to the ER for further evaluation.  He was given an infusion for high calcium.  Past Medical History:  Diagnosis Date  . Anxiety   . Arthritis   . 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 HOH (hard of hearing)   . Hypertension   . Osteomyelitis of toe ([place]) 06/08/11   right foot  . Seasonal allergies   . Type II diabetes mellitus ([place])    diet controlled    Patient Active Problem List   Diagnosis Date Noted  . Hypercalcemia of malignancy 05/06/2019  . Iron deficiency anemia due to chronic blood loss 02/24/2019  . 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 Bilateral pulmonary embolism ([place]) 12/25/2017  . CKD (chronic kidney disease), stage III 12/25/2017  . Thoracic lymphadenopathy 12/25/2017  . PAD (peripheral artery disease) ([place]) 10/23/2011  . HLD (hyperlipidemia) 07/13/2011  . Osteomyelitis of toe of right foot ([place]) 06/26/2011  . 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 Diabetes mellitus, type 2 ([place]) 01/26/2009  . Essential hypertension 01/26/2009  . BKA, LEFT LEG 01/26/2009    Past Surgical History:  Procedure Laterality Date  . AMPUTATION  06/08/2011   Procedure: AMPUTATION RAY;  Surgeon: Wylene Simmer, MD;  Location: [facility];  Service: Orthopedics;  Laterality: Right;  Righ Hallux Amputation  . [place]   "crushed"  . FRACTURE SURGERY    . 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 PILONIDAL CYST / SINUS EXCISION  1970's  . TOE AMPUTATION  06/08/11   partial; right great toe    Prior to Admission medications   Medication Sig Start Date End Date Taking? Authorizing Provider  amLODipine (NORVASC) 5 MG tablet Take 1 tablet (5 mg total) by mouth daily. 04/09/19  Yes Cammie Sickle, MD  aspirin 81 MG chewable tablet Chew 81 mg by mouth at bedtime.   Yes [provider]  ECHINACEA PO Take 760 mg by mouth daily.   Yes [provider]  fluticasone (FLONASE) 50 MCG/ACT nasal spray Place 2 sprays into both nostrils daily as needed for allergies or rhinitis.   Yes [provider]  IMBRUVICA 420 MG TABS TAKE 1 TABLET (420 MG) BY MOUTH DAILY. 03/25/19  Yes Cammie Sickle, MD  Iron-Vitamin C 65-125 MG TABS Take 1 tablet by mouth. VITRON C   Yes [provider]  JANUVIA 100 MG tablet Take 100 mg by mouth at bedtime.  10/12/17  Yes [provider]  metFORMIN (GLUCOPHAGE) 500 MG tablet Take 1,000 mg by mouth 2 (two) times daily. 11/10/17  Yes [provider]  pravastatin (PRAVACHOL) 40 MG tablet Take 40 mg by mouth at bedtime.  11/10/17  Yes [provider]  allopurinol (ZYLOPRIM) 300 MG tablet Take 1 tablet (300 mg total) by mouth 2 (two) times daily. Patient not taking: Reported on 05/06/2019 12/19/18   Cammie Sickle, MD  apixaban (ELIQUIS) 5 MG TABS tablet Take 1 tablet (5 mg total) by mouth 2 (two) times daily. Patient not taking: Reported on 05/06/2019 10/22/18   Cammie Sickle, MD  Ginseng 100 MG CAPS Take 100 mg by mouth daily.    [provider]  lisinopril (PRINIVIL,ZESTRIL) 40 MG tablet Take 40 mg by mouth daily. 11/10/17   [provider]  mupirocin ointment (BACTROBAN) 2 %  APPLY WITH Q TIP THREE TIMES DAILY AS NEEDED FOR DRYNESS AND CRUSTING 01/18/18   [provider]  Omega-3 Fatty Acids (OMEGA 3 500) 500 MG CAPS Take 500 mg by mouth daily.    [provider]  ondansetron (ZOFRAN) 4 MG tablet TAKE 1 TABLET BY MOUTH EVERY 8 HOURS AS NEEDED FOR NAUSEA FOR VOMITING Patient not taking: Reported on 05/06/2019 12/09/18   Cammie Sickle, MD  ONE TOUCH ULTRA TEST test strip USE 1 STRIP TO CHECK GLUCOSE ONCE A WEEK 01/09/18   [provider]  Saw Palmetto 450 MG CAPS Take 450 mg by mouth daily.    [provider]    Allergies Penicillins  Family History  Problem Relation Age of Onset  . Prostate cancer Father   . Hypertension Father   . Other Mother        VARICOSE VEINS    Social History Social History   Tobacco Use  . Smoking status: Former Smoker    Packs/day: 1.50    Years: 22.00    Pack years: 33.00    Types: Cigarettes    Quit date: 05/24/2011    Years since quitting: 7.9  . Smokeless tobacco: Former Systems developer    Types: Chew    Quit date: 03/07/1983  Substance Use Topics  . Alcohol use: Yes    Comment: 06/08/11 "no liquor for 2 1/2 years; last beer 05/31/11"  . Drug use: No    Review of Systems Level 5 caveat: Unable to obtain review of systems due to altered mental status    ____________________________________________   PHYSICAL EXAM:  VITAL SIGNS: ED Triage Vitals  Enc Vitals Group     BP 05/06/19 1539 (!) 159/62     Pulse Rate 05/06/19 1539 (!) 107     Resp 05/06/19 1539 20     Temp 05/06/19 1539 99.1 F (37.3 C)     Temp src --      SpO2 05/06/19 1539 100 %     Weight 05/06/19 1538 171 lb (77.6 kg)     Height 05/06/19 1538 6\' 1"  (1.854 m)     Head Circumference --      Peak Flow --      Pain Score 05/06/19 1538 0     Pain Loc --      Pain Edu? --      Excl. in [place]? --     Constitutional: Alert, oriented x2.  Appears confused and is slow to answer questions.  Otherwise  well-appearing. Eyes: Conjunctivae are normal.  Head: Atraumatic. Nose: No congestion/rhinnorhea. Mouth/Throat: Mucous membranes are dry.   Neck: Normal range of motion.  Cardiovascular: Normal rate, regular rhythm. Grossly normal heart sounds.  Good peripheral circulation. Respiratory: Normal respiratory effort.  No retractions. Lungs CTAB. Gastrointestinal: Soft and nontender. No distention.  Genitourinary: No flank tenderness. Musculoskeletal: No lower extremity edema.  Extremities warm and well perfused.  Neurologic: Motor intact in all extremities. Skin:  Skin is warm and dry. No rash noted. Psychiatric: Calm and cooperative.  ____________________________________________   LABS (all labs ordered are listed, but only abnormal results are displayed)  Labs Reviewed  MAGNESIUM - Abnormal; Notable for the following components:      Result Value   Magnesium 1.2 (*)    All other components within normal limits  URINALYSIS, COMPLETE (UACMP) WITH MICROSCOPIC - Abnormal; Notable for the following components:   Color, Urine YELLOW (*)    APPearance CLEAR (*)    Protein, ur 30 (*)    All other components within normal limits  BASIC METABOLIC PANEL - Abnormal; Notable for the following components:   Sodium 134 (*)    CO2 21 (*)    Glucose, Bld 177 (*)    BUN 31 (*)    Creatinine, Ser 1.38 (*)    Calcium 10.6 (*)    GFR calc non Af Amer 53 (*)    All other components within normal limits  HEPATIC FUNCTION PANEL - Abnormal; Notable for the following components:   Total Protein 5.7 (*)    Albumin 2.6 (*)    AST 14 (*)    All other components within normal limits  CBC WITH DIFFERENTIAL/PLATELET - Abnormal; Notable for the following components:   RBC 3.51 (*)    Hemoglobin 7.7 (*)    HCT 25.7 (*)    MCV 73.2 (*)    MCH 21.9 (*)    RDW 15.9 (*)    Monocytes Absolute 1.2 (*)    Abs Immature Granulocytes 0.08 (*)    All other components within normal limits  PHOSPHORUS  AMMONIA   TSH  CBC WITH DIFFERENTIAL/PLATELET  TROPONIN I (HIGH SENSITIVITY)  TROPONIN I (HIGH SENSITIVITY)   ____________________________________________  EKG    ____________________________________________  RADIOLOGY  CXR: No focal infiltrate or other acute abnormality CT head no ICH or other acute findings  ____________________________________________   PROCEDURES  Procedure(s) performed: No  Procedures  Critical Care performed: No ____________________________________________   INITIAL IMPRESSION / ASSESSMENT AND PLAN / ED COURSE  Pertinent labs & imaging results that were available during my care of the patient were reviewed by me and considered in my medical decision making (see chart for details).  69 year old male with PMH as noted above presents with altered mental status which has worsened over the last several days and is associated with urinary incontinence.  The patient is followed at the cancer center and got an infusion for elevated calcium today and then was sent to the ED.  I reviewed the past medical records in epic.  I confirmed that the patient has a history of small B-cell lymphoma and CLL and is currently being followed at the cancer center.  He has history of anemia as well.  Per the note from Dr. Rogue Bussing from this afternoon, he was given a Zometa infusion today.  On exam, the patient is alert but confused.  He is otherwise relatively comfortable appearing.  His vital signs are normal except for borderline elevated temperature and tachycardia.  Neurologic exam is nonfocal.  The remainder of the physical exam is unremarkable.  Differential includes urinary tract or other infection, hypercalcemia or other electrolyte abnormality, other metabolic cause, or less likely CNS etiology.  We will obtain repeat basic labs as well as additional electrolytes, ammonia, and TSH, urinalysis, and reassess.  If this work-up is unrevealing, I will consider brain  imaging.  ----------------------------------------- 10:47 PM on 05/06/2019 -----------------------------------------  Initial lab work-up showed no significant acute findings other than the patient's known hypercalcemia and anemia.  Urinalysis and chest x-ray showed no findings of infection.  I obtained a CT head which was also negative.  However, given the persistent altered mental status, the patient will require admission for further observation.  I discussed his case with Dr. Flossie Buffy from the hospitalist service.   ____________________________________________   FINAL CLINICAL IMPRESSION(S) / ED DIAGNOSES  Final diagnoses:  Altered mental status, unspecified altered mental status type      NEW MEDICATIONS STARTED DURING THIS VISIT:  New Prescriptions   No medications on file     Note:  This document was prepared using Dragon voice recognition software and may include unintentional dictation errors.    Arta Silence, MD 05/06/19 2248

## 2019-05-06 NOTE — Progress Notes (Signed)
Notified Dr. Rogue Bussing that patient temperature has increased to 101.9, with AMS. Patient received IV Zometa and 500 mls of NS. Patient BP is 173/60 HR 96 and O2 is 97%. Dr. Tish Men agreed to send to patient to the ED for further evaluation. Patient was sent to ED in wheelchair. IV remains in place.

## 2019-05-06 NOTE — ED Notes (Signed)
Per pt family, pt has been newly incontinent x4 days, with a little more difficulty communicating in that time. Pt confusion has become worse over the past 24 hrs. Pt family reports no recent falls.

## 2019-05-06 NOTE — ED Notes (Signed)
Pt spouse updated

## 2019-05-06 NOTE — Patient Instructions (Signed)
#   STOP IMBRUVICA # STOP LISINOPRIL

## 2019-05-06 NOTE — Progress Notes (Signed)
[facility] NOTE  Patient Care Team: [name], MD as PCP - General (Internal Medicine)  CHIEF COMPLAINTS/PURPOSE OF CONSULTATION: SMALL CELL LYMPHOMA/CLL  #  Oncology History Overview Note  # October 2019- SMALL CELL LYMPHOMA; ki-67-10%. STAGE- III/ IV; PET scan- bulky LN [Dr.Vaught] above/below Diaphragm; No spleen/liver/bone;   # 22nd October 2019- Bil PE on lovenox xstopped; NOV 2019- Eliquis 5 mg BID. AUG 2020-slight incresae in RP LN ~1cm; continue Ibrutinib;  # worsening Anemia- Jan 2021- Hb 8.5; STOP eliquis; FOBT- positive; referral to GI/pt declines  # DM/HTN/ CKD- stage III -----------------------------------------------   MOLECULAR TESTING: Trisomy 12. 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  Patient wife available at the appointment today.  [name] 69 y.o.  male above diagnosis of of SLL/CLL on ibrutinib; incidental bilateral PE on currently off Eliquis [secondary to worsening anemia] is here for follow-up.   Patient finally agreed to make an appointment with the GI.  Has appointment with with Dr. Allen Norris in April.  As per wife is more tired.  Intermittently confused.  Poor appetite.  Losing weight.  He was urinary incontinent in the clinic.  Low-grade fevers noted in the clinic noted   Review of Systems  Unable to perform ROS: Mental acuity     MEDICAL HISTORY:  Past Medical History:  Diagnosis Date  . Anxiety   . Arthritis   . 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 HOH (hard of hearing)   . Hypertension   . 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 Seasonal allergies   . Type II diabetes mellitus ([place])    diet controlled    SURGICAL HISTORY: Past Surgical History:  Procedure Laterality Date  . AMPUTATION  06/08/2011   Procedure: AMPUTATION RAY;  Surgeon: Wylene Simmer, MD;  Location: [facility];  Service: Orthopedics;  Laterality: Right;  Righ [place] Amputation  . [place]   "crushed"  . FRACTURE SURGERY    . 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 PILONIDAL CYST / SINUS EXCISION  1970's  . TOE AMPUTATION  06/08/11   partial; right great toe    SOCIAL HISTORY: Social History   Socioeconomic History  . Marital status: Married    Spouse name: Not on file  . Number of children: Not on file  . Years of education: Not on file  . Highest education level: Not on file  Occupational History  . Not on file  Tobacco Use  . Smoking status: Former Smoker    Packs/day: 1.50    Years: 22.00    Pack years: 33.00    Types: Cigarettes    Quit date: 05/24/2011    Years since quitting: 7.9  . Smokeless tobacco: Former Systems developer    Types: [place] date: 03/07/1983  Substance and Sexual Activity  . Alcohol use: Yes    Comment: 06/08/11 "no liquor for 2 1/2 years; last beer 05/31/11"  . Drug use: No  .  Sexual activity: Yes  Other Topics Concern  . Not on file  Social History Narrative   Married   Social Determinants of Health   Financial Resource Strain:   . Difficulty of Paying Living Expenses: Not on file  Food Insecurity:   . Worried About Charity fundraiser in the Last Year: Not on file  . Ran Out of Food in the Last Year: Not on file  Transportation Needs:   . Lack of Transportation (Medical): Not on file  . Lack of Transportation (Non-Medical): Not on file  Physical Activity:   . Days of Exercise per Week: Not on file  . Minutes of Exercise per Session: Not on file  Stress:   . Feeling of Stress : Not on file  Social Connections:   . Frequency of Communication with  Friends and Family: Not on file  . Frequency of Social Gatherings with Friends and Family: Not on file  . Attends Religious Services: Not on file  . Active Member of Clubs or Organizations: Not on file  . Attends Archivist Meetings: Not on file  . Marital Status: Not on file  Intimate Partner Violence:   . Fear of Current or Ex-Partner: Not on file  . Emotionally Abused: Not on file  . Physically Abused: Not on file  . Sexually Abused: Not on file    FAMILY HISTORY: Family History  Problem Relation Age of Onset  . Prostate cancer Father   . Hypertension Father   . Other Mother        VARICOSE VEINS    ALLERGIES:  is allergic to penicillins.  MEDICATIONS:  Current Outpatient Medications  Medication Sig Dispense Refill  . allopurinol (ZYLOPRIM) 300 MG tablet Take 1 tablet (300 mg total) by mouth 2 (two) times daily. 180 tablet 0  . amLODipine (NORVASC) 5 MG tablet Take 1 tablet (5 mg total) by mouth daily. 90 tablet 1  . apixaban (ELIQUIS) 5 MG TABS tablet Take 1 tablet (5 mg total) by mouth 2 (two) times daily. 180 tablet 1  . ECHINACEA PO Take 760 mg by mouth daily.    . fluticasone (FLONASE) 50 MCG/ACT nasal spray Place 2 sprays into both nostrils daily as needed for allergies or rhinitis.    . IMBRUVICA 420 MG TABS TAKE 1 TABLET (420 MG) BY MOUTH DAILY. 28 tablet 3  . Iron-Vitamin C 65-125 MG TABS Take 1 tablet by mouth. VITRON C    . JANUVIA 100 MG tablet Take 100 mg by mouth daily.  3  . lisinopril (PRINIVIL,ZESTRIL) 40 MG tablet Take 40 mg by mouth daily.    . metFORMIN (GLUCOPHAGE) 500 MG tablet Take 1,000 mg by mouth 2 (two) times daily.    . mupirocin ointment (BACTROBAN) 2 % APPLY WITH Q TIP THREE TIMES DAILY AS NEEDED FOR DRYNESS AND CRUSTING  3  . Omega-3 Fatty Acids (OMEGA 3 500) 500 MG CAPS Take 500 mg by mouth daily.    . ondansetron (ZOFRAN) 4 MG tablet TAKE 1 TABLET BY MOUTH EVERY 8 HOURS AS NEEDED FOR NAUSEA FOR VOMITING 20 tablet 2  . ONE TOUCH  ULTRA TEST test strip USE 1 STRIP TO CHECK GLUCOSE ONCE A WEEK  99  . pravastatin (PRAVACHOL) 40 MG tablet Take 40 mg by mouth daily.    . Ginseng 100 MG CAPS Take 100 mg by mouth daily.    . Saw Palmetto 450 MG CAPS Take 450 mg by mouth daily.  No current facility-administered medications for this visit.      Marland Kitchen  PHYSICAL EXAMINATION: ECOG PERFORMANCE STATUS: 1 - Symptomatic but completely ambulatory  Vitals:   05/06/19 1339  BP: (!) 149/88  Pulse: (!) 106  Temp: (!) 100.7 F (38.2 C)   Filed Weights   05/06/19 1339  Weight: 171 lb (77.6 kg)    Physical Exam  Constitutional: He is well-developed, well-nourished, and in no distress.  Accompanied by his wife.  Appears confused.  In a wheelchair.  HENT:  Head: Normocephalic and atraumatic.  Mouth/Throat: Oropharynx is clear and moist. No oropharyngeal exudate.  Eyes: Pupils are equal, round, and reactive to light.  Cardiovascular: Normal rate and regular rhythm.  Pulmonary/Chest: Effort normal and breath sounds normal. No respiratory distress. He has no wheezes.  Abdominal: Soft. Bowel sounds are normal. He exhibits no distension and no mass. There is no abdominal tenderness. There is no rebound and no guarding.  Musculoskeletal:        General: No tenderness or edema. Normal range of motion.     Cervical back: Normal range of motion and neck supple.  Lymphadenopathy:  Significant improvement of the lymphadenopathy in the neck/resolved; scattered approximately 1 cm few lymph nodes present in the right left underarm.  Neurological:  Alert but oriented x1-2.  Skin: Skin is warm.  Psychiatric: Affect normal.     LABORATORY DATA:  I have reviewed the data as listed Lab Results  Component Value Date   WBC 8.6 05/06/2019   HGB 8.3 (L) 05/06/2019   HCT 29.3 (L) 05/06/2019   MCV 74.7 (L) 05/06/2019   PLT 383 05/06/2019   Recent Labs    04/07/19 1326 04/22/19 1303 05/06/19 1323  NA 135 136 136  K 4.2 4.3 4.5   CL 100 102 100  CO2 _0 GLUCOSE 186* 197* 162*  BUN 24* 32* 34*  CREATININE 1.38* 1.43* 1.50*  CALCIUM 9.5 10.4* 11.3*  GFRNONAA 53* 50* 47*  GFRAA >60 58* 55*  PROT 6.2* 6.0* 6.2*  ALBUMIN 3.2* 2.8* 2.8*  AST 12* 11* 12*  ALT _1 ALKPHOS 57 52 54  BILITOT 0.5 0.3 0.4    RADIOGRAPHIC STUDIES: I have personally reviewed the radiological images as listed and agreed with the findings in the report. No results found.  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  IGVH mutated. On Ibrutinib 420 mg/day; CT nov 2020- CT C/A/P- STABLE ~4 cm/bulky retroperitoneal adenopathy.  Currently on Imbruvica.  #However concerns of progression of disease [hypercalcemia-see below]; recommend PET scan for further evaluation ASAP.  If not then CT scan chest and pelvis needs to be done.  Okay to discontinue Imbruvica.  #Low-grade fevers-unclear etiology; recommend further evaluation emergency room including chest x-ray/UA; Covid testing etc.  #Hypercalcemia-concerning for malignancy related-mental status changes; recommend Zometa.   #Anemia-hemoglobin 8.3- stable;  -iron deficiency/ FOBT positive; patient likely need GI evaluation in the hospital.  #Bilateral PE [sep 2019]-HELD Eliquis [jan 18th 2020- held- sec to worsening anemia].  Stable    DISPOSITION: #Patient sent to emergency room.        All questions were answered. The patient knows to call the clinic with any problems, questions or concerns.     Cammie Sickle, MD 05/06/2019 4:14 PM

## 2019-05-06 NOTE — ED Notes (Signed)
Pt agitated, exposing self, and trying to get out of bed

## 2019-05-06 NOTE — ED Notes (Signed)
Pt calmed, no longer agitated. Pt within view of NT and RN, with door open for visibility. Lights dimmed and pt currently resting

## 2019-05-06 NOTE — Assessment & Plan Note (Addendum)
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 On Ibrutinib 420 mg/day; CT nov 2020- CT C/A/P- STABLE ~4 cm/bulky retroperitoneal adenopathy.  Currently on Imbruvica.  #However concerns of progression of disease [hypercalcemia-see below]; recommend PET scan for further evaluation ASAP.  If not then CT scan chest and pelvis needs to be done.  Okay to discontinue Imbruvica.  #Low-grade fevers-unclear etiology; recommend further evaluation emergency room including chest x-ray/UA; Covid testing etc.  #Hypercalcemia-concerning for malignancy related-mental status changes; recommend Zometa.   #Anemia-hemoglobin 8.3- stable;  -iron deficiency/ FOBT positive; patient likely need GI evaluation in the hospital.  #Bilateral PE [sep 2019]-HELD Eliquis [jan 18th 2020- held- sec to worsening anemia].  Stable    DISPOSITION: #Patient sent to emergency room.

## 2019-05-06 NOTE — ED Triage Notes (Signed)
Pt comes from [facility] with c/o AMS. Pt arrives mumbling and unable to fully understand.  Cancer center reports pt's Calcium was elevated. Pt was given medication for his calcium.  Pt is confused and unable to follow simple commands.

## 2019-05-06 NOTE — H&P (Signed)
History and Physical    [name] [id number] DOB: Apr 20, 1950 DOA: 05/06/2019  PCP: [name], MD  Patient coming from: Home, lives with wife  I have personally briefly reviewed patient's old medical records in [place]  Chief Complaint: altered mental status  HPI: [name] is a 69 y.o. 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   Patient is unable to provide any history given his altered mental status.  History was obtained in its entirety from wife and documentation.  Wife reports that about 6 weeks ago he began iron infusion due to worsening anemia and that after every infusion he would have incontinence for a few days which will eventually resolve.  However his last infusion 2 weeks ago she noticed that he continued to have persistent incontinence.  He also appeared more fatigue and was sleeping more than usual.  He was very weak and almost had several falls.  Patient also told his wife at one point that he felt "confused."  He also seems to be having trouble with word finding lately although he recently was diagnosed with hearing impairment and has not yet received his hearing aids.  She denies noticing any cough.  He has chronic runny nose.  Denies any diarrhea and that he actually has issues with constipation but did have a bowel movement yesterday.  Denies any vomiting.  Patient has lost weight but eats very little.  Today he presented to his oncology office for a Zometa infusion and was noted to be febrile up to 101.9 and was sent to ED for further evaluation.  Of note, pt has been FOBT positive since 03/01/2019 but has declined GI follow up.  However now has an appointment with GI for  planned colonoscopy. Patient follows with oncologist Dr. Rogue Bussing.   ED Course:  He was afebrile in the ED but had fever 101.9 in oncology office, tachycardic with mildly elevated BP on room air.  WBC of 6.9, hemoglobin of 7.7 down from 8.1 two weeks ago.  Na of 134, glucose of 177, creatinine of 1.38 with baseline around 1-1.2.  Hypercalcemia of 10.6 which is around his baseline. UA and CXR were negative.  He was agitated and was given Ativan 1 mg x 2 in the ED.   Review of Systems: Unable to obtain due to patient's altered mental status   Past Medical History:  Diagnosis Date  . Anxiety   . Arthritis   . 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 HOH (hard of hearing)   . Hypertension   . Osteomyelitis of toe ([place]) 06/08/11   right foot  . Seasonal allergies   . Type II diabetes mellitus ([place])    diet controlled    Past Surgical History:  Procedure Laterality Date  . AMPUTATION  06/08/2011   Procedure: AMPUTATION RAY;  Surgeon: Wylene Simmer, MD;  Location: [facility];  Service: Orthopedics;  Laterality: Right;  [place] Amputation  . [place]   "crushed"  . FRACTURE SURGERY    . 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 PILONIDAL CYST / SINUS EXCISION  1970's  . TOE AMPUTATION  06/08/11   partial; right great toe  reports that he quit smoking about 7 years ago. His smoking use included cigarettes. He has a 33.00 pack-year smoking history. He quit smokeless tobacco use about 36 years ago.  His smokeless tobacco use included chew. He reports current alcohol use. He reports that he does not use drugs.  Allergies  Allergen Reactions  . Penicillins Anaphylaxis and Other (See Comments)    Has patient had a PCN reaction causing immediate rash, facial/tongue/throat swelling, SOB or lightheadedness with hypotension: Unknown Has patient had a PCN reaction causing severe rash involving mucus membranes or skin necrosis: Unknown Has patient had a PCN  reaction that required hospitalization: Unknown Has patient had a PCN reaction occurring within the last 10 years: No If all of the above answers are "NO", then may proceed with Cephalosporin use.     Family History  Problem Relation Age of Onset  . Prostate cancer Father   . Hypertension Father   . Other Mother        VARICOSE VEINS     Prior to Admission medications   Medication Sig Start Date End Date Taking? Authorizing Provider  amLODipine (NORVASC) 5 MG tablet Take 1 tablet (5 mg total) by mouth daily. 04/09/19  Yes Cammie Sickle, MD  aspirin 81 MG chewable tablet Chew 81 mg by mouth at bedtime.   Yes [provider]  ECHINACEA PO Take 760 mg by mouth daily.   Yes [provider]  fluticasone (FLONASE) 50 MCG/ACT nasal spray Place 2 sprays into both nostrils daily as needed for allergies or rhinitis.   Yes [provider]  IMBRUVICA 420 MG TABS TAKE 1 TABLET (420 MG) BY MOUTH DAILY. 03/25/19  Yes Cammie Sickle, MD  Iron-Vitamin C 65-125 MG TABS Take 1 tablet by mouth. VITRON C   Yes [provider]  JANUVIA 100 MG tablet Take 100 mg by mouth at bedtime.  10/12/17  Yes [provider]  metFORMIN (GLUCOPHAGE) 500 MG tablet Take 1,000 mg by mouth 2 (two) times daily. 11/10/17  Yes [provider]  pravastatin (PRAVACHOL) 40 MG tablet Take 40 mg by mouth at bedtime.  11/10/17  Yes [provider]  allopurinol (ZYLOPRIM) 300 MG tablet Take 1 tablet (300 mg total) by mouth 2 (two) times daily. Patient not taking: Reported on 05/06/2019 12/19/18   Cammie Sickle, MD  apixaban (ELIQUIS) 5 MG TABS tablet Take 1 tablet (5 mg total) by mouth 2 (two) times daily. Patient not taking: Reported on 05/06/2019 10/22/18   Cammie Sickle, MD  Ginseng 100 MG CAPS Take 100 mg by mouth daily.    [provider]  lisinopril (PRINIVIL,ZESTRIL) 40 MG tablet Take 40 mg by mouth daily. 11/10/17   [provider]    mupirocin ointment (BACTROBAN) 2 % APPLY WITH Q TIP THREE TIMES DAILY AS NEEDED FOR DRYNESS AND CRUSTING 01/18/18   [provider]  Omega-3 Fatty Acids (OMEGA 3 500) 500 MG CAPS Take 500 mg by mouth daily.    [provider]  ondansetron (ZOFRAN) 4 MG tablet TAKE 1 TABLET BY MOUTH EVERY 8 HOURS AS NEEDED FOR NAUSEA FOR VOMITING Patient not taking: Reported on 05/06/2019 12/09/18   Cammie Sickle, MD  ONE TOUCH ULTRA TEST test strip USE 1 STRIP TO CHECK GLUCOSE ONCE A WEEK 01/09/18   [provider]  Saw Palmetto 450 MG CAPS Take 450 mg by mouth daily.    [provider]    Physical Exam: Vitals:  05/06/19 1900 05/06/19 1930 05/06/19 2232 05/06/19 2245  BP: (!) 152/60 (!) 153/51    Pulse: 98 94  (!) 117  Resp: (!) 23 (!) 28    Temp:   97.8 F (36.6 C)   TempSrc:   Oral   SpO2: 98% 94%  98%  Weight:      Height:        Constitutional: NAD, calm, comfortable, lethargic male laying flat in bed asleep Vitals:   05/06/19 1900 05/06/19 1930 05/06/19 2232 05/06/19 2245  BP: (!) 152/60 (!) 153/51    Pulse: 98 94  (!) 117  Resp: (!) 23 (!) 28    Temp:   97.8 F (36.6 C)   TempSrc:   Oral   SpO2: 98% 94%  98%  Weight:      Height:       Eyes: PERRL, lids and conjunctivae normal ENMT: Mucous membranes are moist.  Neck: normal, supple Respiratory: clear to auscultation bilaterally, no wheezing, no crackles. Normal respiratory effort on room air. No accessory muscle use.  Cardiovascular: Tachycardic, no murmurs / rubs / gallops. No extremity edema. Abdomen: no tenderness, no masses palpated.  Bowel sounds positive.  Musculoskeletal: no clubbing / cyanosis.  Left BKA with prosthetic in place. Skin: no rashes, lesions, ulcers. No induration Neurologic: Unable to fully assess given patient's altered mental status.  He was lethargic but awoke with light touch and was able to mumble some incoherent words.  Otherwise unable to follow commands and  would easily drift back to sleep.  Psychiatric: Unable to assess given altered mental status   Labs on Admission: I have personally reviewed following labs and imaging studies  CBC: Recent Labs  Lab 05/06/19 1323 05/06/19 1903  WBC 8.6 6.9  NEUTROABS 5.8 4.7  HGB 8.3* 7.7*  HCT 29.3* 25.7*  MCV 74.7* 73.2*  PLT 383 [id number]   Basic Metabolic Panel: Recent Labs  Lab 05/06/19 1323 05/06/19 1816  NA 136 134*  K 4.5 4.5  CL 100 103  CO2 24 21*  GLUCOSE 162* 177*  BUN 34* 31*  CREATININE 1.50* 1.38*  CALCIUM 11.3* 10.6*  MG  --  1.2*  PHOS  --  2.6   GFR: Estimated Creatinine Clearance: 57 mL/min (A) (by C-G formula based on SCr of 1.38 mg/dL (H)). Liver Function Tests: Recent Labs  Lab 05/06/19 1323 05/06/19 1816  AST 12* 14*  ALT 9 11  ALKPHOS 54 47  BILITOT 0.4 0.8  PROT 6.2* 5.7*  ALBUMIN 2.8* 2.6*   No results for input(s): LIPASE, AMYLASE in the last 168 hours. Recent Labs  Lab 05/06/19 1816  AMMONIA 23   Coagulation Profile: No results for input(s): INR, PROTIME in the last 168 hours. Cardiac Enzymes: No results for input(s): CKTOTAL, CKMB, CKMBINDEX, TROPONINI in the last 168 hours. BNP (last 3 results) No results for input(s): PROBNP in the last 8760 hours. HbA1C: No results for input(s): HGBA1C in the last 72 hours. CBG: No results for input(s): GLUCAP in the last 168 hours. Lipid Profile: No results for input(s): CHOL, HDL, LDLCALC, TRIG, CHOLHDL, LDLDIRECT in the last 72 hours. Thyroid Function Tests: Recent Labs    05/06/19 1816  TSH 1.056   Anemia Panel: No results for input(s): VITAMINB12, FOLATE, FERRITIN, TIBC, IRON, RETICCTPCT in the last 72 hours. Urine analysis:    Component Value Date/Time   COLORURINE YELLOW (A) 05/06/2019 1816   APPEARANCEUR CLEAR (A) 05/06/2019 1816   LABSPEC 1.012 05/06/2019 1816   PHURINE  5.0 05/06/2019 1816   GLUCOSEU NEGATIVE 05/06/2019 1816   HGBUR NEGATIVE 05/06/2019 1816   BILIRUBINUR NEGATIVE  05/06/2019 1816   KETONESUR NEGATIVE 05/06/2019 1816   PROTEINUR 30 (A) 05/06/2019 1816   UROBILINOGEN 1.0 01/13/2009 0329   NITRITE NEGATIVE 05/06/2019 1816   LEUKOCYTESUR NEGATIVE 05/06/2019 1816    Radiological Exams on Admission: CT Head Wo Contrast  Result Date: 05/06/2019 CLINICAL DATA:  Altered mental status, unclear cause EXAM: CT HEAD WITHOUT CONTRAST TECHNIQUE: Contiguous axial images were obtained from the base of the skull through the vertex without intravenous contrast. COMPARISON:  None. 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 No CT evident large vascular territory or cortically based infarction is seen. No evidence of acute, hemorrhage, hydrocephalus, extra-axial collection or mass lesion/mass effect. Patchy areas of white matter hypoattenuation are most compatible with chronic microvascular angiopathy. Symmetric prominence of the ventricles, cisterns and sulci compatible with parenchymal volume loss. Vascular: Atherosclerotic calcification of the carotid siphons and intradural vertebral arteries. No hyperdense vessel. Skull: No calvarial fracture or suspicious osseous lesion. No scalp swelling or hematoma. Vascular calcium of the scalp, often seen in diabetes. Sinuses/Orbits: Paranasal sinuses and mastoid air cells are predominantly clear. Included orbital structures are unremarkable. Other: None IMPRESSION: 1. Multiple acquisition required due to patient motion artifact. 2. No convincing CT evidence of acute intracranial abnormality. 3. 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 4. Chronic microangiopathic changes with parenchymal volume loss. Intracranial atherosclerosis. Electronically Signed   By: Lovena Le M.D.   On: 05/06/2019 21:15   DG Chest Portable 1 View  Result Date: 05/06/2019 CLINICAL DATA:  Fevers and altered mental status, history of lymphoma EXAM: PORTABLE CHEST 1 VIEW  COMPARISON:  01/24/2019 CT, plain film from 11/13/2017 FINDINGS: Cardiac shadow is within normal limits. The lungs are well aerated bilaterally. No focal infiltrate or sizable effusion is seen. Aortic calcifications are noted. No acute bony abnormality is noted. Exuberant calcification at the first costochondral margins is noted bilaterally. IMPRESSION: No acute abnormality noted. Electronically Signed   By: Inez Catalina M.D.   On: 05/06/2019 22:18    Assessment/Plan  Altered mentation status in the setting of small cell B-cell lymphoma possibly multifactorial from lymphoma, hypercalcemia, worsening anemia and suspected infection from fever or unknown origin.  Pt afebrile in ED but had 101.9 earlier in oncology office Negative UA and CXR.  Will start vancomycin and cefepime while awaiting blood culture given his immunocompromise state  will obtain COVID PCR and RVP Need to consult Heme/Onc in the morning for PET scan vs CT chest and abdomen (they had mention this in outpatient note) Avoid any more benzos. Need to reorient and consider in-room sitting before any sedating medications  Acute on chronic kidney disease stage 3a creatinine of 1.38 with baseline around 1-1.2. 500 cc NS bolus Avoid nephrotoxic agent  Hypercalcemia related to malignancy. Stable.  on Zometa monthly with oncology   Chronic anemia/iron deficiency Gets outpatient IV venofer infusions pt noted to be FOBT positive since 03/01/2019 but has declined GI follow up. Now willing to have colonoscopy. Could consider GI consult depending on workup for his AMS. Transfusion threshold <7   Hx of bilateral PE in 2019 previously on Eliquis but d/c due to worsening anemia pt is febrile and tachycardic which could be signs of PE but given the addition of AMS it makes it less likely. However could consider CTA chest pending clinical course  Type 2 diabetes  Sensitive sliding scale  Left BKA  has prosthetic leg in  place wheelchair bound  DVT prophylaxis:.SCDs Code Status: Full- this was confirmed with wife Family Communication: Plan discussed with wife over the phone disposition Plan: Home with at least 2 midnight stays  Consults called:  Admission status: inpatient regarding at least 2 midnight stay given fever of unknown origin with altered mental status in the setting of small cell [facility]   If 7PM-7AM, please contact night-coverage [email or web address]   05/06/2019, 11:11 PM

## 2019-05-07 ENCOUNTER — Inpatient Hospital Stay: Payer: Medicare Other

## 2019-05-07 LAB — RESPIRATORY PANEL BY PCR

## 2019-05-07 LAB — COMPREHENSIVE METABOLIC PANEL
ALT: 10 U/L (ref 0–44)
AST: 11 U/L — ABNORMAL LOW (ref 15–41)
Albumin: 2.6 g/dL — ABNORMAL LOW (ref 3.5–5.0)
Alkaline Phosphatase: 49 U/L (ref 38–126)
Anion gap: 8 (ref 5–15)
BUN: 29 mg/dL — ABNORMAL HIGH (ref 8–23)
CO2: 25 mmol/L (ref 22–32)
Calcium: 10.3 mg/dL (ref 8.9–10.3)
Chloride: 103 mmol/L (ref 98–111)
Creatinine, Ser: 1.42 mg/dL — ABNORMAL HIGH (ref 0.61–1.24)
GFR calc Af Amer: 59 mL/min — ABNORMAL LOW (ref >60)
GFR calc non Af Amer: 51 mL/min — ABNORMAL LOW (ref >60)
Glucose, Bld: 166 mg/dL — ABNORMAL HIGH (ref 70–99)
Potassium: 3.9 mmol/L (ref 3.5–5.1)
Sodium: 136 mmol/L (ref 135–145)
Total Bilirubin: 0.6 mg/dL (ref 0.3–1.2)
Total Protein: 6.3 g/dL — ABNORMAL LOW (ref 6.5–8.1)

## 2019-05-07 LAB — HIV ANTIBODY (ROUTINE TESTING W REFLEX): HIV Screen 4th Generation wRfx: NONREACTIVE

## 2019-05-07 LAB — HEMOGLOBIN A1C
Hgb A1c MFr Bld: 7.3 % — ABNORMAL HIGH (ref 4.8–5.6)
Mean Plasma Glucose: 162.81 mg/dL

## 2019-05-07 LAB — GLUCOSE, CAPILLARY
Glucose-Capillary: 128 mg/dL — ABNORMAL HIGH (ref 70–99)
Glucose-Capillary: 127 mg/dL — ABNORMAL HIGH (ref 70–99)
Glucose-Capillary: 117 mg/dL — ABNORMAL HIGH (ref 70–99)
Glucose-Capillary: 156 mg/dL — ABNORMAL HIGH (ref 70–99)

## 2019-05-07 LAB — CBC
HCT: 27.9 % — ABNORMAL LOW (ref 39.0–52.0)
Hemoglobin: 8.1 g/dL — ABNORMAL LOW (ref 13.0–17.0)
MCH: 21.7 pg — ABNORMAL LOW (ref 26.0–34.0)
MCHC: 29 g/dL — ABNORMAL LOW (ref 30.0–36.0)
MCV: 74.8 fL — ABNORMAL LOW (ref 80.0–100.0)
Platelets: 339 10*3/uL (ref 150–400)
RBC: 3.73 MIL/uL — ABNORMAL LOW (ref 4.22–5.81)
RDW: 16 % — ABNORMAL HIGH (ref 11.5–15.5)
WBC: 6.5 10*3/uL (ref 4.0–10.5)
nRBC: 0 % (ref 0.0–0.2)

## 2019-05-07 LAB — SARS CORONAVIRUS 2 (TAT 6-24 HRS): SARS Coronavirus 2: NEGATIVE

## 2019-05-07 MED ORDER — ACETAMINOPHEN 650 MG RE SUPP
650.0000 mg | Freq: Once | RECTAL | Status: AC
  Administered 2019-05-07: 650 mg via RECTAL

## 2019-05-07 MED ORDER — LORAZEPAM 2 MG/ML IJ SOLN: 0.5000 mg | Freq: Four times a day (QID) | INTRAMUSCULAR | Status: DC | PRN

## 2019-05-07 MED ORDER — HALOPERIDOL LACTATE 5 MG/ML IJ SOLN
5.0000 mg | Freq: Once | INTRAMUSCULAR | Status: AC
  Administered 2019-05-07: 5 mg via INTRAVENOUS
  Filled 2019-05-07: qty 1

## 2019-05-07 MED ORDER — ACETAMINOPHEN 650 MG RE SUPP: 650.0000 mg | RECTAL | Status: DC | PRN

## 2019-05-07 MED ORDER — MAGNESIUM SULFATE 2 GM/50ML IV SOLN
2.0000 g | Freq: Once | INTRAVENOUS | Status: AC
  Administered 2019-05-07: 2 g via INTRAVENOUS
  Filled 2019-05-07: qty 50

## 2019-05-07 MED ORDER — VANCOMYCIN HCL 750 MG/150ML IV SOLN
750.0000 mg | Freq: Two times a day (BID) | INTRAVENOUS | Status: DC
  Administered 2019-05-07 – 2019-05-08 (×4): 750 mg via INTRAVENOUS
  Filled 2019-05-07 (×7): qty 150

## 2019-05-07 MED ORDER — SODIUM CHLORIDE 0.9 % IV SOLN
2.0000 g | Freq: Three times a day (TID) | INTRAVENOUS | Status: DC
  Administered 2019-05-07 – 2019-05-08 (×3): 2 g via INTRAVENOUS
  Filled 2019-05-07 (×5): qty 2

## 2019-05-07 MED ORDER — HALOPERIDOL LACTATE 5 MG/ML IJ SOLN
1.0000 mg | Freq: Four times a day (QID) | INTRAMUSCULAR | Status: DC | PRN
  Administered 2019-05-07: 1 mg via INTRAVENOUS
  Filled 2019-05-07: qty 1

## 2019-05-07 MED ORDER — HALOPERIDOL LACTATE 5 MG/ML IJ SOLN: 2.0000 mg | Freq: Four times a day (QID) | INTRAMUSCULAR | Status: DC | PRN

## 2019-05-07 NOTE — Progress Notes (Signed)
D: Pt alert and oriented x1. Pt denies experiencing any pain at this time. Pt mood/affect is agitated/irrittable/combative. Pt's mood/affect improved with spouse at bedside. Pt was calmer and more cooperative with spouse's presents. Physician is aware of pt's status from this morning.  A: Scheduled medications administered to pt, per MD orders. Support and encouragement provided. Frequent verbal contact made.   R: No adverse drug reactions noted. Pt complaint with medications and treatment plan. Pt interacts well with staff on the unit. Pt remains safe at this time. Will continue to monitor and provide care for as ordered.

## 2019-05-07 NOTE — Progress Notes (Addendum)
PROGRESS NOTE    [name]  [id number] DOB: 1950-11-30 DOA: 05/06/2019 PCP: [name], MD    Brief Narrative:  [name] is a 69 y.o. 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     Consultants:     Procedures: CT head  Antimicrobials:   Cefepime and vancomycin   Subjective: Max of 102.9.  Patient was combative this a.m. and was given Haldol.  Objective: Vitals:   05/07/19 0003 05/07/19 0030 05/07/19 0102 05/07/19 0438  BP: (!) 155/62  (!) 146/63 (!) 119/52  Pulse: 95  88 87  Resp: (!) 22  20 18   Temp:  (!) 102.9 F (39.4 C)  97.7 F (36.5 C)  TempSrc:  Rectal  Axillary  SpO2: 98%  96% 98%  Weight:      Height:       No intake or output data in the 24 hours ending 05/07/19 1308 Filed Weights   05/06/19 1538  Weight: 77.6 kg    Examination:  General exam: Appears calm and comfortable , but does try to get out of bed Respiratory system: Clear to auscultation.  With poor respiratory effort  Cardiovascular system: S1 & S2 heard, RRR. No JVD, murmurs, rubs, gallops or clicks.  Gastrointestinal system: Abdomen is nondistended, soft and nontender. Normal bowel sounds heard. Central nervous system: awakens, does not respond to questions, only ask for wife.  Extremities:no edema Skin: Warm dry Psychiatry:  Mood & affect appropriate in current setting.     Data Reviewed: I have personally reviewed following labs and imaging studies  CBC: Recent Labs  Lab 05/06/19 1323 05/06/19 1903 05/07/19 0451  WBC 8.6 6.9 6.5  NEUTROABS 5.8 4.7  --   HGB 8.3* 7.7* 8.1*  HCT 29.3* 25.7* 27.9*  MCV 74.7* 73.2* 74.8*  PLT 383 313 [id number]   Basic Metabolic Panel: Recent Labs  Lab  05/06/19 1323 05/06/19 1816 05/07/19 0451  NA 136 134* 136  K 4.5 4.5 3.9  CL 100 103 103  CO2 24 21* 25  GLUCOSE 162* 177* 166*  BUN 34* 31* 29*  CREATININE 1.50* 1.38* 1.42*  CALCIUM 11.3* 10.6* 10.3  MG  --  1.2*  --   PHOS  --  2.6  --    GFR: Estimated Creatinine Clearance: 55.4 mL/min (A) (by C-G formula based on SCr of 1.42 mg/dL (H)). Liver Function Tests: Recent Labs  Lab 05/06/19 1323 05/06/19 1816 05/07/19 0451  AST 12* 14* 11*  ALT 9 11 10   ALKPHOS 54 47 49  BILITOT 0.4 0.8 0.6  PROT 6.2* 5.7* 6.3*  ALBUMIN 2.8* 2.6* 2.6*   No results for input(s): LIPASE, AMYLASE in the last 168 hours. Recent Labs  Lab 05/06/19 1816  AMMONIA 23   Coagulation Profile: No results for input(s): INR, PROTIME in the last 168 hours. Cardiac Enzymes: No results for input(s): CKTOTAL, CKMB, CKMBINDEX, TROPONINI in the last 168 hours. BNP (last 3 results) No results for input(s): PROBNP in the last 8760 hours. HbA1C: Recent Labs    05/07/19 0451  HGBA1C 7.3*   CBG: Recent Labs  Lab 05/07/19 0805  GLUCAP 128*   Lipid Profile: No results for input(s): CHOL, HDL, LDLCALC, TRIG, CHOLHDL, LDLDIRECT in the last 72  hours. Thyroid Function Tests: Recent Labs    05/06/19 1816  TSH 1.056   Anemia Panel: No results for input(s): VITAMINB12, FOLATE, FERRITIN, TIBC, IRON, RETICCTPCT in the last 72 hours. Sepsis Labs: No results for input(s): PROCALCITON, LATICACIDVEN in the last 168 hours.  Recent Results (from the past 240 hour(s))  SARS CORONAVIRUS 2 (TAT 6-24 HRS) Nasopharyngeal Nasopharyngeal Swab     Status: None   Collection Time: 05/06/19 11:49 PM   Specimen: Nasopharyngeal Swab  Result Value Ref Range Status   SARS Coronavirus 2 NEGATIVE NEGATIVE Final    Comment: (NOTE) SARS-CoV-2 target nucleic acids are NOT DETECTED. The SARS-CoV-2 RNA is generally detectable in upper and lower respiratory specimens during the acute phase of infection. Negative results do  not preclude SARS-CoV-2 infection, do not rule out co-infections with other pathogens, and should not be used as the sole basis for treatment or other patient management decisions. Negative results must be combined with clinical observations, patient history, and epidemiological information. The expected result is Negative. Fact Sheet for Patients: SugarRoll.be Fact Sheet for Healthcare Providers: https://www.woods-mathews.com/ This test is not yet approved or cleared by the [place] FDA and  has been authorized for detection and/or diagnosis of SARS-CoV-2 by FDA under an Emergency Use Authorization (EUA). This EUA will remain  in effect (meaning this test can be used) for the duration of the COVID-19 declaration under Section 56 4(b)(1) of the Act, 21 U.S.C. section 360bbb-3(b)(1), unless the authorization is terminated or revoked sooner. Performed at [facility], [place] [address] [place], [place] [address]   Respiratory Panel by PCR     Status: None   Collection Time: 05/06/19 11:49 PM   Specimen: Nasopharyngeal Swab; Respiratory  Result Value Ref Range Status   Adenovirus NOT DETECTED NOT DETECTED Final   Coronavirus 229E NOT DETECTED NOT DETECTED Final    Comment: (NOTE) The Coronavirus on the Respiratory Panel, DOES NOT test for the novel  Coronavirus (2019 nCoV)    Coronavirus HKU1 NOT DETECTED NOT DETECTED Final   Coronavirus NL63 NOT DETECTED NOT DETECTED Final   Coronavirus OC43 NOT DETECTED NOT DETECTED Final   Metapneumovirus NOT DETECTED NOT DETECTED Final   Rhinovirus / Enterovirus NOT DETECTED NOT DETECTED Final   Influenza A NOT DETECTED NOT DETECTED Final   Influenza B NOT DETECTED NOT DETECTED Final   Parainfluenza Virus 1 NOT DETECTED NOT DETECTED Final   Parainfluenza Virus 2 NOT DETECTED NOT DETECTED Final   Parainfluenza Virus 3 NOT DETECTED NOT DETECTED Final   Parainfluenza Virus 4 NOT DETECTED NOT  DETECTED Final   Respiratory Syncytial Virus NOT DETECTED NOT DETECTED Final   Bordetella pertussis NOT DETECTED NOT DETECTED Final   Chlamydophila pneumoniae NOT DETECTED NOT DETECTED Final   Mycoplasma pneumoniae NOT DETECTED NOT DETECTED Final    Comment: Performed at [facility], [place]. [address] [place], [place] [address]  CULTURE, BLOOD (ROUTINE X 2) w Reflex to ID Panel     Status: None (Preliminary result)   Collection Time: 05/07/19  4:51 AM   Specimen: BLOOD  Result Value Ref Range Status   Specimen Description BLOOD LEFT ANTECUBITAL  Final   Special Requests   Final    BOTTLES DRAWN AEROBIC AND ANAEROBIC Blood Culture adequate volume   Culture   Final    NO GROWTH <12 HOURS Performed at [facility], [address] [place], [place] [address]    Report Status PENDING  Incomplete  CULTURE, BLOOD (  ROUTINE X 2) w Reflex to ID Panel     Status: None (Preliminary result)   Collection Time: 05/07/19  5:00 AM   Specimen: BLOOD  Result Value Ref Range Status   Specimen Description BLOOD BLOOD LEFT HAND  Final   Special Requests   Final    BOTTLES DRAWN AEROBIC AND ANAEROBIC Blood Culture adequate volume   Culture   Final    NO GROWTH <12 HOURS Performed at [facility], [address], [place],  [place] [address]    Report Status PENDING  Incomplete         Radiology Studies: CT Head Wo Contrast  Result Date: 05/06/2019 CLINICAL DATA:  Altered mental status, unclear cause EXAM: CT HEAD WITHOUT CONTRAST TECHNIQUE: Contiguous axial images were obtained from the base of the skull through the vertex without intravenous contrast. COMPARISON:  None. 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 No CT evident large vascular territory or cortically based infarction is seen. No evidence of acute, hemorrhage, hydrocephalus, extra-axial collection or mass lesion/mass effect. Patchy areas of white  matter hypoattenuation are most compatible with chronic microvascular angiopathy. Symmetric prominence of the ventricles, cisterns and sulci compatible with parenchymal volume loss. Vascular: Atherosclerotic calcification of the carotid siphons and intradural vertebral arteries. No hyperdense vessel. Skull: No calvarial fracture or suspicious osseous lesion. No scalp swelling or hematoma. Vascular calcium of the scalp, often seen in diabetes. Sinuses/Orbits: Paranasal sinuses and mastoid air cells are predominantly clear. Included orbital structures are unremarkable. Other: None IMPRESSION: 1. Multiple acquisition required due to patient motion artifact. 2. No convincing CT evidence of acute intracranial abnormality. 3. 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 4. Chronic microangiopathic changes with parenchymal volume loss. Intracranial atherosclerosis. Electronically Signed   By: Lovena Le M.D.   On: 05/06/2019 21:15   DG Chest Portable 1 View  Result Date: 05/06/2019 CLINICAL DATA:  Fevers and altered mental status, history of lymphoma EXAM: PORTABLE CHEST 1 VIEW COMPARISON:  01/24/2019 CT, plain film from 11/13/2017 FINDINGS: Cardiac shadow is within normal limits. The lungs are well aerated bilaterally. No focal infiltrate or sizable effusion is seen. Aortic calcifications are noted. No acute bony abnormality is noted. Exuberant calcification at the first costochondral margins is noted bilaterally. IMPRESSION: No acute abnormality noted. Electronically Signed   By: Inez Catalina M.D.   On: 05/06/2019 22:18        Scheduled Meds: . amLODipine  5 mg Oral Daily  . aspirin  81 mg Oral QHS  . insulin aspart  0-9 Units Subcutaneous TID WC  . pravastatin  40 mg Oral QHS   Continuous Infusions: . ceFEPime (MAXIPIME) IV 2 g (05/07/19 1120)  . 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  Continue with vancomycin and cefepime while awaiting blood culture given his immunocompromise state  Covid negative  Oncology consulted-is supposed to have  PET scan vs CT chest and abdomen (they had mention this in outpatient note) Avoid any more benzos. Need to reorient and consider in-room sitting before any sedating medications  Acute on chronic kidney disease stage 3a At baseline. Avoid nephrotoxic agent  Hypercalcemia related to malignancy. Stable.  on Zometa monthly with oncology   Chronic anemia/iron deficiency Gets outpatient IV venofer infusions pt noted to be FOBT positive since 03/01/2019 but has declined GI follow up. Now willing to have colonoscopy. Could consider GI consult depending on workup for his AMS. Transfusion threshold <7   Hx of bilateral PE in 2019 previously on Eliquis but d/c due to worsening anemia He is febrile and tachy, but need to r/o infection before obtaining cta. As 02 sat stable.  Type 2 diabetes Sensitive sliding scale  Left BKA  has prosthetic leg in place wheelchair bound    DVT prophylaxis: scd Code Status:full Family Communication: none at bedside Disposition Plan: Patient came from home,, will likely return to home once medically stable Barrier to discharge patient currently with acute mental status and still febrile, work-up pending results.  Likely will be here 1-2 more days       LOS: 1 day    Time spent: 45 minutes with more than 50% on [place], MD [facility] Pager [phone]  If 7PM-7AM, please contact night-coverage [email or web address] Password [email or web address] 05/07/2019, 1:08 PM

## 2019-05-07 NOTE — Progress Notes (Signed)
Patient admitted from ED due to [place]. Patient is confused, aggressive, and combative. Patient has kicked at staff and has tried to hit staff. Patient has made verbal threats. At [place] am Secure Chat NP Sharion Settler to inform her of the patient's agitation. Orders were placed for a sitter and haldol IV, and cardiac monitor. Will continue to monitor the patient. (Patient becomes more agitated when he is touched).

## 2019-05-07 NOTE — ED Notes (Addendum)
ED TO INPATIENT HANDOFF REPORT  ED Nurse Name and Phone #:   Gershon Mussel RN  [phone]  S Name/Age/Gender [name] 69 y.o. male Room/Bed: ED25A/ED25A  Code Status   Code Status: Full Code  Home/SNF/Other Home {Patient oriented to: none Is this baseline? No   Triage Complete: Triage complete  Chief Complaint AMS (altered mental status) [R41.82]  Triage Note Pt comes from [place] with c/o AMS. Pt arrives mumbling and unable to fully understand.  Cancer center reports pt's Calcium was elevated. Pt was given medication for his calcium.  Pt is confused and unable to follow simple commands.    Allergies Allergies  Allergen Reactions  . Penicillins Anaphylaxis and Other (See Comments)    Has patient had a PCN reaction causing immediate rash, facial/tongue/throat swelling, SOB or lightheadedness with hypotension: Unknown Has patient had a PCN reaction causing severe rash involving mucus membranes or skin necrosis: Unknown Has patient had a PCN reaction that required hospitalization: Unknown Has patient had a PCN reaction occurring within the last 10 years: No If all of the above answers are "NO", then may proceed with Cephalosporin use.     Level of Care/Admitting Diagnosis ED Disposition    ED Disposition Condition [facility] Area: [place] [[id number]]  Level of Care: Med-Surg [16]  Covid Evaluation: Asymptomatic Screening Protocol (No Symptoms)  Diagnosis: AMS (altered mental status) [id number]  Admitting Physician: Orene Desanctis [id number]  Attending Physician: Orene Desanctis [id number]  Estimated length of stay: past midnight tomorrow  Certification:: I certify this patient will need inpatient services for at least 2 midnights       B Medical/Surgery History Past Medical History:  Diagnosis Date  . Anxiety   . Arthritis   . 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 HOH (hard of hearing)   . Hypertension   .  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 Seasonal allergies   . Type II diabetes mellitus ([place])    diet controlled   Past Surgical History:  Procedure Laterality Date  . AMPUTATION  06/08/2011   Procedure: AMPUTATION RAY;  Surgeon: Wylene Simmer, MD;  Location: [facility];  Service: Orthopedics;  Laterality: Right;  Righ Hallux Amputation  . [place]   "crushed"  . FRACTURE SURGERY    . 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 PILONIDAL CYST / SINUS EXCISION  1970's  . TOE AMPUTATION  06/08/11   partial; right great toe     A IV Location/Drains/Wounds Patient Lines/Drains/Airways Status   Active Line/Drains/Airways    Name:   Placement date:   Placement time:   Site:   Days:   Peripheral IV 05/07/19 Right Arm   05/07/19    0034    Arm   less than 1   Incision 06/08/11 Foot Right   06/08/11    1445     2890          Intake/Output Last 24 hours No intake or output data in the 24 hours ending 05/07/19 0103  Labs/Imaging Results for orders placed or performed during the hospital encounter of 05/06/19 (from the past 48 hour(s))  Magnesium     Status: Abnormal   Collection Time: 05/06/19  6:16 PM  Result Value Ref Range   Magnesium 1.2 (L) 1.7 - 2.4 mg/dL    Comment: Performed at [facility], [address], [place], [place] [address]  Phosphorus     Status: None   Collection Time: 05/06/19  6:16 PM  Result Value Ref Range   Phosphorus 2.6 2.5 - 4.6 mg/dL    Comment: Performed at [facility], [address] [place], [place] [address]  Ammonia     Status: None   Collection Time: 05/06/19  6:16 PM  Result Value Ref Range   Ammonia 23 9 - 35 umol/L    Comment: Performed at [facility], [place], [place] [address]  Troponin I (High Sensitivity)     Status: None   Collection Time: 05/06/19  6:16 PM  Result Value Ref Range   Troponin I (High Sensitivity) 13 <18 ng/L    Comment: (NOTE) Elevated high  sensitivity troponin I (hsTnI) values and significant  changes across serial measurements may suggest ACS but many other  chronic and acute conditions are known to elevate hsTnI results.  Refer to the Links section for chest pain algorithms and additional  guidance. Performed at [facility], [address] [place], [place] [address]   Urinalysis, Complete w Microscopic     Status: Abnormal   Collection Time: 05/06/19  6:16 PM  Result Value Ref Range   Color, Urine YELLOW (A) YELLOW   APPearance CLEAR (A) CLEAR   Specific Gravity, Urine 1.012 1.005 - 1.030   pH 5.0 5.0 - 8.0   Glucose, UA NEGATIVE NEGATIVE mg/dL   Hgb urine dipstick NEGATIVE NEGATIVE   Bilirubin Urine NEGATIVE NEGATIVE   Ketones, ur NEGATIVE NEGATIVE mg/dL   Protein, ur 30 (A) NEGATIVE mg/dL   Nitrite NEGATIVE NEGATIVE   Leukocytes,Ua NEGATIVE NEGATIVE   RBC / HPF 0-5 0 - 5 RBC/hpf   WBC, UA 0-5 0 - 5 WBC/hpf   Bacteria, UA NONE SEEN NONE SEEN   Squamous Epithelial / LPF NONE SEEN 0 - 5   Hyaline Casts, UA PRESENT     Comment: Performed at [facility], [address] [place], [place] [address]  TSH     Status: None   Collection Time: 05/06/19  6:16 PM  Result Value Ref Range   TSH 1.056 0.350 - 4.500 uIU/mL    Comment: Performed by a 3rd Generation assay with a functional sensitivity of <=0.01 uIU/mL. Performed at [facility], [address] [place], [place] [id number]   Basic metabolic panel     Status: Abnormal   Collection Time: 05/06/19  6:16 PM  Result Value Ref Range   Sodium 134 (L) 135 - 145 mmol/L   Potassium 4.5 3.5 - 5.1 mmol/L   Chloride 103 98 - 111 mmol/L   CO2 21 (L) 22 - 32 mmol/L   Glucose, Bld 177 (H) 70 - 99 mg/dL    Comment: Glucose reference range applies only to samples taken after fasting for at least 8 hours.   BUN 31 (H) 8 - 23 mg/dL   Creatinine, Ser 1.38 (H) 0.61 - 1.24 mg/dL   Calcium 10.6 (H) 8.9 - 10.3 mg/dL   GFR calc non Af Amer 53 (L)  >60 mL/min   GFR calc Af Amer >60 >60 mL/min   Anion gap 10 5 - 15    Comment: Performed at [facility], [address] [place], [place] [address]  Hepatic function panel     Status: Abnormal   Collection Time: 05/06/19  6:16 PM  Result Value Ref Range   Total Protein 5.7 (L) 6.5 -  8.1 g/dL   Albumin 2.6 (L) 3.5 - 5.0 g/dL   AST 14 (L) 15 - 41 U/L   ALT 11 0 - 44 U/L   Alkaline Phosphatase 47 38 - 126 U/L   Total Bilirubin 0.8 0.3 - 1.2 mg/dL   Bilirubin, Direct 0.2 0.0 - 0.2 mg/dL   Indirect Bilirubin 0.6 0.3 - 0.9 mg/dL    Comment: Performed at [facility], [address] [place], [place] [address]  CBC with Differential/Platelet     Status: Abnormal   Collection Time: 05/06/19  7:03 PM  Result Value Ref Range   WBC 6.9 4.0 - 10.5 K/uL   RBC 3.51 (L) 4.22 - 5.81 MIL/uL   Hemoglobin 7.7 (L) 13.0 - 17.0 g/dL    Comment: Reticulocyte Hemoglobin testing may be clinically indicated, consider ordering this additional test [id number]    HCT 25.7 (L) 39.0 - 52.0 %   MCV 73.2 (L) 80.0 - 100.0 fL   MCH 21.9 (L) 26.0 - 34.0 pg   MCHC 30.0 30.0 - 36.0 g/dL   RDW 15.9 (H) 11.5 - 15.5 %   Platelets 313 150 - 400 K/uL   nRBC 0.0 0.0 - 0.2 %   Neutrophils Relative % 68 %   Neutro Abs 4.7 1.7 - 7.7 K/uL   Lymphocytes Relative 13 %   Lymphs Abs 0.9 0.7 - 4.0 K/uL   Monocytes Relative 17 %   Monocytes Absolute 1.2 (H) 0.1 - 1.0 K/uL   Eosinophils Relative 0 %   Eosinophils Absolute 0.0 0.0 - 0.5 K/uL   Basophils Relative 1 %   Basophils Absolute 0.1 0.0 - 0.1 K/uL   Immature Granulocytes 1 %   Abs Immature Granulocytes 0.08 (H) 0.00 - 0.07 K/uL    Comment: Performed at [facility], [address] [place], [place] [address]  Troponin I (High Sensitivity)     Status: None   Collection Time: 05/06/19  7:53 PM  Result Value Ref Range   Troponin I (High Sensitivity) 14 <18 ng/L    Comment: (NOTE) Elevated high sensitivity troponin I (hsTnI) values and  significant  changes across serial measurements may suggest ACS but many other  chronic and acute conditions are known to elevate hsTnI results.  Refer to the "Links" section for chest pain algorithms and additional  guidance. Performed at [facility], [place], [place] [address]    CT Head Wo Contrast  Result Date: 05/06/2019 CLINICAL DATA:  Altered mental status, unclear cause EXAM: CT HEAD WITHOUT CONTRAST TECHNIQUE: Contiguous axial images were obtained from the base of the skull through the vertex without intravenous contrast. COMPARISON:  None. 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 No CT evident large vascular territory or cortically based infarction is seen. No evidence of acute, hemorrhage, hydrocephalus, extra-axial collection or mass lesion/mass effect. Patchy areas of white matter hypoattenuation are most compatible with chronic microvascular angiopathy. Symmetric prominence of the ventricles, cisterns and sulci compatible with parenchymal volume loss. Vascular: Atherosclerotic calcification of the carotid siphons and intradural vertebral arteries. No hyperdense vessel. Skull: No calvarial fracture or suspicious osseous lesion. No scalp swelling or hematoma. Vascular calcium of the scalp, often seen in diabetes. Sinuses/Orbits: Paranasal sinuses and mastoid air cells are predominantly clear. Included orbital structures are unremarkable. Other: None IMPRESSION: 1. Multiple acquisition required due to patient motion artifact. 2. No convincing CT evidence of acute intracranial abnormality. 3. 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 4. Chronic microangiopathic changes with parenchymal volume loss. Intracranial atherosclerosis. Electronically Signed   By: Lovena Le M.D.   On: 05/06/2019 21:15   DG Chest Portable 1 View  Result Date: 05/06/2019 CLINICAL DATA:  Fevers  and altered mental status, history of lymphoma EXAM: PORTABLE CHEST 1 VIEW COMPARISON:  01/24/2019 CT, plain film from 11/13/2017 FINDINGS: Cardiac shadow is within normal limits. The lungs are well aerated bilaterally. No focal infiltrate or sizable effusion is seen. Aortic calcifications are noted. No acute bony abnormality is noted. Exuberant calcification at the first costochondral margins is noted bilaterally. IMPRESSION: No acute abnormality noted. Electronically Signed   By: Inez Catalina M.D.   On: 05/06/2019 22:18    Pending Labs Unresulted Labs (From admission, onward)    Start     Ordered   05/07/19 0500  Comprehensive metabolic panel  Tomorrow morning,   STAT     05/06/19 2257   05/06/19 2353  Hemoglobin A1c  Once,   STAT    Comments: To assess prior glycemic control    05/06/19 2352   05/06/19 2312  Respiratory Panel by PCR  (Respiratory virus panel with precautions)  Once,   STAT     05/06/19 2311   05/06/19 2301  SARS CORONAVIRUS 2 (TAT 6-24 HRS) Nasopharyngeal Nasopharyngeal Swab  (Tier 3 (TAT 6-24 hrs))  Once,   STAT    Question Answer Comment  Is this test for diagnosis or screening Screening   Symptomatic for COVID-19 as defined by CDC No   Hospitalized for COVID-19 No   Admitted to ICU for COVID-19 No   Previously tested for COVID-19 No   Resident in a congregate (group) care setting No   Employed in healthcare setting No      05/06/19 2300   05/06/19 2259  CULTURE, BLOOD (ROUTINE X 2) w Reflex to ID Panel  BLOOD CULTURE X 2,   STAT     05/06/19 2259   05/06/19 2257  HIV Antibody (routine testing w rflx)  (HIV Antibody (Routine testing w reflex) panel)  Once,   STAT     05/06/19 2257   05/06/19 1607  CBC with Differential  Once,   STAT     05/06/19 1606          Vitals/Pain Today's Vitals   05/06/19 2245 05/07/19 0003 05/07/19 0030 05/07/19 0102  BP:  (!) 155/62  (!) 146/63  Pulse: (!) 117 95  88  Resp:  (!) 22  20  Temp:   (!) 102.9 F (39.4 C)    TempSrc:   Rectal   SpO2: 98% 98%  96%  Weight:      Height:      PainSc:        Isolation Precautions Droplet precaution  Medications Medications  ceFEPIme (MAXIPIME) 2 g in sodium chloride 0.9 % 100 mL IVPB (has no administration in time range)  vancomycin (VANCOCIN) IVPB 1000 mg/200 mL premix (1,000 mg Intravenous New Bag/Given 05/07/19 0035)  aspirin chewable tablet 81 mg (has no administration in time range)  amLODipine (NORVASC) tablet 5 mg (has no administration in time range)  pravastatin (PRAVACHOL) tablet 40 mg ( Oral Canceled Entry 05/07/19 0005)  insulin aspart (novoLOG) injection 0-9 Units (has no administration in time range)  acetaminophen (TYLENOL) suppository 650 mg (has no administration in time range)  LORazepam (ATIVAN) injection 1 mg (1 mg Intravenous Given 05/06/19 1659)  LORazepam (ATIVAN) injection 1 mg (1 mg Intravenous Given  05/06/19 2245)  sodium chloride 0.9 % bolus 500 mL (500 mLs Intravenous New Bag/Given 05/07/19 0034)  acetaminophen (TYLENOL) suppository 650 mg (650 mg Rectal Given 05/07/19 0046)    Mobility walks with device High fall risk   Focused Assessments Cardiac Assessment Handoff:  Cardiac Rhythm: Normal sinus rhythm Lab Results  Component Value Date   CKTOTAL 28 01/16/2009   TROPONINI <0.03 12/25/2017   No results found for: DDIMER Does the Patient currently have chest pain? No     R Recommendations: See Admitting Provider Note  Report given to:  Laverda Page RN on 1A  Additional Notes:

## 2019-05-07 NOTE — Progress Notes (Signed)
Pharmacy Antibiotic Note  [name] is a 69 y.o. male admitted on 05/06/2019 with fever of unknown origin.  Pharmacy has been consulted for Cefepime and Vancomycin dosing.  Pt received Vancomycin 1000mg  in ED.  Plan: Cefepime 2gm IV q8hrs  Vancomycin 750 mg IV Q 12 hrs. Goal AUC 400-550. Expected AUC: 505.4 SCr used: 1.38, expected trough ~ 15.9   Height: 6\' 1"  (185.4 cm) Weight: 171 lb (77.6 kg) IBW/kg (Calculated) : 79.9  Temp (24hrs), Avg:100.5 F (38.1 C), Min:97.8 F (36.6 C), Max:102.9 F (39.4 C)  Recent Labs  Lab 05/06/19 1323 05/06/19 1816 05/06/19 1903  WBC 8.6  --  6.9  CREATININE 1.50* 1.38*  --     Estimated Creatinine Clearance: 57 mL/min (A) (by C-G formula based on SCr of 1.38 mg/dL (H)).    Allergies  Allergen Reactions  . Penicillins Anaphylaxis and Other (See Comments)    Has patient had a PCN reaction causing immediate rash, facial/tongue/throat swelling, SOB or lightheadedness with hypotension: Unknown Has patient had a PCN reaction causing severe rash involving mucus membranes or skin necrosis: Unknown Has patient had a PCN reaction that required hospitalization: Unknown Has patient had a PCN reaction occurring within the last 10 years: No If all of the above answers are "NO", then may proceed with Cephalosporin use.     Antimicrobials this admission: Vancomycin 3/3 >>  Cefepime 3/3 >>   Dose adjustments this admission:   Microbiology results:  BCx:   UCx:    Sputum:    MRSA PCR:   Thank you for allowing pharmacy to be a part of this patient's care.  Hart Robinsons A 05/07/2019 2:25 AM

## 2019-05-07 NOTE — Progress Notes (Signed)
Order received from Dr. Kurtis Bushman to discontinue droplet precautions

## 2019-05-07 NOTE — Plan of Care (Signed)
  Problem: Clinical Measurements: Goal: Ability to maintain clinical measurements within normal limits will improve Outcome: Progressing Note: Last temp 97.7   Problem: Safety: Goal: Ability to remain free from injury will improve Outcome: Progressing Note: Sitter at bedside   Problem: Education: Goal: Knowledge of General Education information will improve Description: Including pain rating scale, medication(s)/side effects and non-pharmacologic comfort measures Outcome: Not Progressing Note: New admit, confused patient   Problem: Coping: Goal: Level of anxiety will decrease Outcome: Not Progressing Note: Combative and aggressive during shift, haldol was given

## 2019-05-07 NOTE — Evaluation (Addendum)
Clinical/Bedside Swallow Evaluation Patient Details  Name: [name] MRN: [id number] Date of Birth: 07-Sep-1950  Today's Date: 05/07/2019 Time: SLP Start Time (ACUTE ONLY): [id number] SLP Stop Time (ACUTE ONLY): 1635 SLP Time Calculation (min) (ACUTE ONLY): 50 min  Past Medical History:  Past Medical History:  Diagnosis Date  . Anxiety   . Arthritis   . 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 HOH (hard of hearing)   . Hypertension   . 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 Seasonal allergies   . Type II diabetes mellitus ([place])    diet controlled   Past Surgical History:  Past Surgical History:  Procedure Laterality Date  . AMPUTATION  06/08/2011   Procedure: AMPUTATION RAY;  Surgeon: Wylene Simmer, MD;  Location: [facility];  Service: Orthopedics;  Laterality: Right;  Righ Hallux Amputation  . [place]   "crushed"  . FRACTURE SURGERY    . 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 PILONIDAL CYST / SINUS EXCISION  1970's  . TOE AMPUTATION  06/08/11   partial; right great toe   HPI:  Pt is a 69 y.o. 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  Wife reports that about 6 weeks ago he began iron infusion due to worsening anemia and that after every infusion he would have incontinence for a few days which will eventually resolve.  However his last infusion 2 weeks ago she noticed that he continued to have persistent incontinence.  He also appeared more fatigue and was sleeping more than usual.  He was very weak and almost had several falls.  Patient also told his wife at one point that he felt "confused."  He seems to be having trouble with speaking d/t the confusion.  He was  recently diagnosed with hearing impairment and has not yet received his hearing aids.  Wife reported pt has had Weight loss.  Pt was admitted w/ dx of Altered mentation status in the setting of small cell B-cell lymphoma w/ worsening anemia and suspected infection.  Pt was initially agitated and combative requiring Medication for the agitation.  Noted Imaging results including Chest CT revealing "Progressive lymphoma within the neck, chest, abdomen".     Assessment / Plan / Recommendation Clinical Impression  Pt presents w/ declined Cognitive status and increased Confusion at this evaluation today. Pt was admitted w/ dx of Altered mentation status in the setting of small cell B-cell lymphoma, worsening anemia and suspected infection. Pt has been agitated and combative during this admission requiring Medications to calm; Sitter present in room. W/ cues and encouragement as well as reducing Distractions in room, pt appears to present w/ Mild+ oropharyngeal phase dysphagia suspect impacted by pt's Cognitive status/decline currently. With modified po consistencies, pt's swallowing function was grossly wfl w/ No overt clinical s/s of aspiration noted during po trials given -- puree foods and Nectar consistency liquids via cup/straw. Pt consumed trials w/ No decline in resipratory status, no coughing, and no wet vocal quality noted during/post trials. Oral phase c/b min decreased bolus awareness during the po intake/trials and min slower bolus management and prolonged A-P transfer for swallowing. W/ verbal cues for  encouragement/awareness and Time, oral clearing was achieved w/ all trials. Reducing distractions in room was done as well(less talking). Informal OM exam appeared [facility] w/ no unilateral weakness noted. Tongue and lips were dry. Pt attempted to hold Cup and feed self when drinking w/ SLP but needed full support w/ po intake/tasks. Recommend initiation of a Pureed diet w/ Nectar liquids; general aspiration  precautions w/ all oral intake; Pills Crushed in Puree for safer swallowing (d/t Cognitive decline). Support at meals w/ feeding d/t weakness and declined Cognitive status. Pt is presenting w/ Confusion during engagement intermittently -- primarily during awareness of tasks and oral clearing of po's. Pt benefits from Supervision during all oral intake; reducing distractions. ST services will f/u w/ trials to upgrade as appropriate and safe for pt over next 1-3 days.  SLP Visit Diagnosis: Dysphagia, oropharyngeal phase (R13.12)    Aspiration Risk  Mild aspiration risk;Risk for inadequate nutrition/hydration(reduced following precautions)    Diet Recommendation  Dysphagia level 1 (puree) w/ Nectar liquids via cup/straw - monitoring and Supervision w/ all oral intake. General aspiration precautions and feeding support at meals. Reduce distractions at meals.  Medication Administration: Crushed with puree(for safer swallowing)    Other  Recommendations Recommended Consults: (Dietician f/u) Oral Care Recommendations: Oral care BID;Oral care before and after PO;Staff/trained caregiver to provide oral care Other Recommendations: Order thickener from pharmacy;Prohibited food (jello, ice cream, thin soups);Remove water pitcher;Have oral suction available   Follow up Recommendations Skilled Nursing facility(TBD)      Frequency and Duration min 3x week  2 weeks       Prognosis Prognosis for Safe Diet Advancement: Fair(-Good) Barriers to Reach Goals: Cognitive deficits;Time post onset;Severity of deficits;Behavior;Medication      Swallow Study   General Date of Onset: 05/06/19 HPI: Pt is a 69 y.o. 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  Wife reports that about 6 weeks ago he began iron infusion due to worsening anemia and that after every infusion he would have incontinence for a few days which will eventually resolve.  However his last infusion 2 weeks ago she noticed that he continued to have persistent incontinence.  He also appeared more fatigue and was sleeping more than usual.  He was very weak and almost had several falls.  Patient also told his wife at one point that he felt "confused."  He seems to be having trouble with speaking d/t the confusion.  He was recently diagnosed with hearing impairment and has not yet received his hearing aids.  Wife reported pt has had Weight loss.  Pt was admitted w/ dx of Altered mentation status in the setting of small cell B-cell lymphoma w/ worsening anemia and suspected infection.  Pt has been agitated and cobative requiring Medication for the agitation.  Type of Study: Bedside Swallow Evaluation Previous Swallow Assessment: none reported Diet Prior to this Study: NPO(regular diet at home) Temperature Spikes Noted: No(wbc 6.5; no temp elevation currently) Respiratory Status: Room air History of Recent Intubation: No Behavior/Cognition: Cooperative;Pleasant mood;Confused;Distractible;Requires cueing(Drowsy) Oral Cavity Assessment: Dry Oral Care Completed by SLP: Yes Oral Cavity - Dentition: Adequate natural dentition(w/ partial plate) Vision: Functional for self-feeding(appeared) Self-Feeding Abilities: Able to feed self;Needs assist;Needs set up;Total assist Patient Positioning: Upright in bed(needed  full positioning support) Baseline Vocal Quality: Normal;Low vocal intensity(mumbled speech) Volitional Cough: Cognitively unable to elicit Volitional Swallow: Unable to elicit    Oral/Motor/Sensory Function Overall Oral Motor/Sensory Function: Within functional limits(w/ most movements; no unilateral weakness; bolus clearing+)   Ice Chips Ice chips: Within  functional limits Presentation: Spoon(fed; 4 trials)   Thin Liquid Thin Liquid: Not tested    Nectar Thick Nectar Thick Liquid: Impaired Presentation: Cup;Self Fed;Straw;Spoon(~4 ozs total) Oral Phase Impairments: Poor awareness of bolus(intermittently) Oral phase functional implications: Oral holding;Prolonged oral transit(intermittently) Pharyngeal Phase Impairments: (no overt clinical s/s )   Honey Thick Honey Thick Liquid: Not tested   Puree Puree: Impaired Presentation: Spoon(fed; 8 trials) Oral Phase Impairments: Poor awareness of bolus(intermittently) Oral Phase Functional Implications: Prolonged oral transit;Oral holding(intermittently) Pharyngeal Phase Impairments: (no overt clinical s/s)   Solid     Solid: Not tested       Orinda Kenner, MS, CCC-SLP Lukka Black 05/07/2019,5:02 PM

## 2019-05-08 ENCOUNTER — Encounter: Payer: Self-pay | Admitting: Family Medicine

## 2019-05-08 ENCOUNTER — Encounter: Admission: RE | Admit: 2019-05-08 | Payer: Medicare Other | Source: Ambulatory Visit

## 2019-05-08 ENCOUNTER — Telehealth: Payer: Self-pay | Admitting: Internal Medicine

## 2019-05-08 DIAGNOSIS — C8308 Small cell B-cell lymphoma, lymph nodes of multiple sites: Principal | ICD-10-CM

## 2019-05-08 LAB — BASIC METABOLIC PANEL
Anion gap: 8 (ref 5–15)
BUN: 29 mg/dL — ABNORMAL HIGH (ref 8–23)
CO2: 25 mmol/L (ref 22–32)
Calcium: 9.7 mg/dL (ref 8.9–10.3)
Chloride: 104 mmol/L (ref 98–111)
Creatinine, Ser: 1.38 mg/dL — ABNORMAL HIGH (ref 0.61–1.24)
GFR calc Af Amer: >60 mL/min (ref >60)
GFR calc non Af Amer: 53 mL/min — ABNORMAL LOW (ref >60)
Glucose, Bld: 158 mg/dL — ABNORMAL HIGH (ref 70–99)
Potassium: 3.9 mmol/L (ref 3.5–5.1)
Sodium: 137 mmol/L (ref 135–145)

## 2019-05-08 LAB — GLUCOSE, CAPILLARY
Glucose-Capillary: 166 mg/dL — ABNORMAL HIGH (ref 70–99)
Glucose-Capillary: 237 mg/dL — ABNORMAL HIGH (ref 70–99)
Glucose-Capillary: 184 mg/dL — ABNORMAL HIGH (ref 70–99)
Glucose-Capillary: 239 mg/dL — ABNORMAL HIGH (ref 70–99)

## 2019-05-08 LAB — MAGNESIUM: Magnesium: 2 mg/dL (ref 1.7–2.4)

## 2019-05-08 LAB — LACTATE DEHYDROGENASE: LDH: 137 U/L (ref 98–192)

## 2019-05-08 MED ORDER — SODIUM CHLORIDE 0.9 % IV SOLN
200.0000 mg | INTRAVENOUS | Status: DC
  Administered 2019-05-08 – 2019-05-09 (×2): 200 mg via INTRAVENOUS
  Filled 2019-05-08 (×2): qty 10

## 2019-05-08 MED ORDER — SODIUM CHLORIDE 0.9 % IV SOLN
2.0000 g | Freq: Two times a day (BID) | INTRAVENOUS | Status: DC
  Administered 2019-05-08 – 2019-05-09 (×3): 2 g via INTRAVENOUS
  Filled 2019-05-08 (×4): qty 2

## 2019-05-08 NOTE — Telephone Encounter (Signed)
I spoke to pt's wife, Hoyle Sauer re: the pt's plan of care- progression of disease on CT scan. Pt likely to be discharged tomorrow.  C- please schedule the patient for PET scan early next week.  # And follow up 1 day later- MD; labs- cbc/bmp/possible zometa; IV venofer.

## 2019-05-08 NOTE — Assessment & Plan Note (Addendum)
#  69 year old male patient history of small cell B-cell lymphoma; anemia iron deficiency; chronic kidney disease-is currently admitted to hospital for mental status changes/hypercalcemia  # Small B-cell lymphoma-most recently on ibrutinib.  Currently discontinued given the clinical progression.  Chest and pelvis CT scan noncontrast done in the hospital shows-progressive disease in the chest; and also abdomen pelvis [up to 6 cm bulky retroperitoneal adenopathy.].  Question transformation high-grade tumor given hypercalcemia; as it is very unlikely for small cell lymphoma to cause hypercalcemia-await PET scan outpatient.  #Hypercalcemia-concerning for malignancy related.Status post Zometa improved.  Monitor outpatient.   #Mental status changes-likely secondary to hypercalcemia; however patient is currently confused/delirium [multifactorial-hospitalization/lack of hearing aids]  #Anemia hemoglobin 8-iron deficiency/fecal occult blood is positive is post IV iron.  Outpatient GI evaluation  #History of bilateral PE [sep 2019]-currently off Eliquis secondary to worsening anemia.   #The patient could be potentially discharged home if clinically stable.  Will follow up in the cancer center/next week the PET scan prior.

## 2019-05-08 NOTE — Progress Notes (Signed)
PROGRESS NOTE    [name]  [id number] DOB: 1950-04-09 DOA: 05/06/2019 PCP: [name], MD    Brief Narrative:  [name] is a 69 y.o. 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     Consultants:     Procedures: CT head  Antimicrobials:   Cefepime and vancomycin   Subjective: Wife at bedside.  He is more awake and alert today.  Difficult hearing.  He denies shortness of breath, chest pain, abdominal pain.  One-on-one sitter at bedside too  Objective: Vitals:   05/07/19 0438 05/07/19 1600 05/07/19 2332 05/08/19 0600  BP: (!) 119/52 (!) 125/56 132/69 (!) 127/57  Pulse: 87 91 87 85  Resp: 18 20 16 17   Temp: 97.7 F (36.5 C) 97.6 F (36.4 C) 97.9 F (36.6 C) 97.8 F (36.6 C)  TempSrc: Axillary Oral    SpO2: 98% 98% 100% 100%  Weight:      Height:        Intake/Output Summary (Last 24 hours) at 05/08/2019 0741 Last data filed at 05/08/2019 [id number] Gross per 24 hour  Intake 810 ml  Output 375 ml  Net 435 ml   Filed Weights   05/06/19 1538  Weight: 77.6 kg    Examination:  General exam: Appears calm and comfortable , wife at bedside Respiratory system: Clear to auscultation.  No wheeze rales rhonchi's Cardiovascular system: S1 & S2 heard, RRR. No JVD, murmurs, rubs, gallops or clicks.  Gastrointestinal system: Abdomen is nondistended, soft and nontender. Normal bowel sounds heard. Central nervous system: Alert oriented x3  extremities:no edema Skin: Warm dry Psychiatry:  Mood & affect appropriate in current setting.     Data Reviewed: I have personally reviewed following labs and imaging studies  CBC: Recent Labs  Lab 05/06/19 1323 05/06/19 1903 05/07/19 0451    WBC 8.6 6.9 6.5  NEUTROABS 5.8 4.7  --   HGB 8.3* 7.7* 8.1*  HCT 29.3* 25.7* 27.9*  MCV 74.7* 73.2* 74.8*  PLT 383 313 [id number]   Basic Metabolic Panel: Recent Labs  Lab 05/06/19 1323 05/06/19 1816 05/07/19 0451 05/08/19 0555  NA 136 134* 136 137  K 4.5 4.5 3.9 3.9  CL 100 103 103 104  CO2 24 21* 25 25  GLUCOSE 162* 177* 166* 158*  BUN 34* 31* 29* 29*  CREATININE 1.50* 1.38* 1.42* 1.38*  CALCIUM 11.3* 10.6* 10.3 9.7  MG  --  1.2*  --  2.0  PHOS  --  2.6  --   --    GFR: Estimated Creatinine Clearance: 57 mL/min (A) (by C-G formula based on SCr of 1.38 mg/dL (H)). Liver Function Tests: Recent Labs  Lab 05/06/19 1323 05/06/19 1816 05/07/19 0451  AST 12* 14* 11*  ALT 9 11 10   ALKPHOS 54 47 49  BILITOT 0.4 0.8 0.6  PROT 6.2* 5.7* 6.3*  ALBUMIN 2.8* 2.6* 2.6*   No results for input(s): LIPASE, AMYLASE in the last 168 hours. Recent Labs  Lab 05/06/19 1816  AMMONIA 23   Coagulation Profile: No results for input(s): INR, PROTIME in the last 168 hours. Cardiac Enzymes: No results for input(s): CKTOTAL, CKMB, CKMBINDEX, TROPONINI in the last 168 hours. BNP (last 3 results) No results for input(s): PROBNP in the  last 8760 hours. HbA1C: Recent Labs    05/07/19 0451  HGBA1C 7.3*   CBG: Recent Labs  Lab 05/07/19 0805 05/07/19 1126 05/07/19 1635 05/07/19 2126  GLUCAP 128* 127* 117* 156*   Lipid Profile: No results for input(s): CHOL, HDL, LDLCALC, TRIG, CHOLHDL, LDLDIRECT in the last 72 hours. Thyroid Function Tests: Recent Labs    05/06/19 1816  TSH 1.056   Anemia Panel: No results for input(s): VITAMINB12, FOLATE, FERRITIN, TIBC, IRON, RETICCTPCT in the last 72 hours. Sepsis Labs: No results for input(s): PROCALCITON, LATICACIDVEN in the last 168 hours.  Recent Results (from the past 240 hour(s))  SARS CORONAVIRUS 2 (TAT 6-24 HRS) Nasopharyngeal Nasopharyngeal Swab     Status: None   Collection Time: 05/06/19 11:49 PM   Specimen: Nasopharyngeal Swab   Result Value Ref Range Status   SARS Coronavirus 2 NEGATIVE NEGATIVE Final    Comment: (NOTE) SARS-CoV-2 target nucleic acids are NOT DETECTED. The SARS-CoV-2 RNA is generally detectable in upper and lower respiratory specimens during the acute phase of infection. Negative results do not preclude SARS-CoV-2 infection, do not rule out co-infections with other pathogens, and should not be used as the sole basis for treatment or other patient management decisions. Negative results must be combined with clinical observations, patient history, and epidemiological information. The expected result is Negative. Fact Sheet for Patients: SugarRoll.be Fact Sheet for Healthcare Providers: https://www.woods-mathews.com/ This test is not yet approved or cleared by the [place] FDA and  has been authorized for detection and/or diagnosis of SARS-CoV-2 by FDA under an Emergency Use Authorization (EUA). This EUA will remain  in effect (meaning this test can be used) for the duration of the COVID-19 declaration under Section 56 4(b)(1) of the Act, 21 U.S.C. section 360bbb-3(b)(1), unless the authorization is terminated or revoked sooner. Performed at [facility], [place] [address] [place], [place] [address]   Respiratory Panel by PCR     Status: None   Collection Time: 05/06/19 11:49 PM   Specimen: Nasopharyngeal Swab; Respiratory  Result Value Ref Range Status   Adenovirus NOT DETECTED NOT DETECTED Final   Coronavirus 229E NOT DETECTED NOT DETECTED Final    Comment: (NOTE) The Coronavirus on the Respiratory Panel, DOES NOT test for the novel  Coronavirus (2019 nCoV)    Coronavirus HKU1 NOT DETECTED NOT DETECTED Final   Coronavirus NL63 NOT DETECTED NOT DETECTED Final   Coronavirus OC43 NOT DETECTED NOT DETECTED Final   Metapneumovirus NOT DETECTED NOT DETECTED Final   Rhinovirus / Enterovirus NOT DETECTED NOT DETECTED Final   Influenza A NOT  DETECTED NOT DETECTED Final   Influenza B NOT DETECTED NOT DETECTED Final   Parainfluenza Virus 1 NOT DETECTED NOT DETECTED Final   Parainfluenza Virus 2 NOT DETECTED NOT DETECTED Final   Parainfluenza Virus 3 NOT DETECTED NOT DETECTED Final   Parainfluenza Virus 4 NOT DETECTED NOT DETECTED Final   Respiratory Syncytial Virus NOT DETECTED NOT DETECTED Final   Bordetella pertussis NOT DETECTED NOT DETECTED Final   Chlamydophila pneumoniae NOT DETECTED NOT DETECTED Final   Mycoplasma pneumoniae NOT DETECTED NOT DETECTED Final    Comment: Performed at [facility], [place]. [address], [place], [place] [address]  CULTURE, BLOOD (ROUTINE X 2) w Reflex to ID Panel     Status: None (Preliminary result)   Collection Time: 05/07/19  4:51 AM   Specimen: BLOOD  Result Value Ref Range Status   Specimen Description BLOOD LEFT ANTECUBITAL  Final  Special Requests   Final    BOTTLES DRAWN AEROBIC AND ANAEROBIC Blood Culture adequate volume   Culture   Final    NO GROWTH <12 HOURS Performed at [facility], [address] [place], [place] [address]    Report Status PENDING  Incomplete  CULTURE, BLOOD (ROUTINE X 2) w Reflex to ID Panel     Status: None (Preliminary result)   Collection Time: 05/07/19  5:00 AM   Specimen: BLOOD  Result Value Ref Range Status   Specimen Description BLOOD BLOOD LEFT HAND  Final   Special Requests   Final    BOTTLES DRAWN AEROBIC AND ANAEROBIC Blood Culture adequate volume   Culture   Final    NO GROWTH <12 HOURS Performed at [facility], [address], [place], [place] [address]    Report Status PENDING  Incomplete         Radiology Studies: CT ABDOMEN PELVIS WO CONTRAST  Result Date: 05/07/2019 CLINICAL DATA:  Hyperparathyroidism. Hypercalcemia. Small B-cell lymphoma, on immune therapy. Pulmonary emboli in September. Altered mental status and fever. EXAM: CT CHEST, ABDOMEN AND PELVIS WITHOUT CONTRAST TECHNIQUE: Multidetector CT  imaging of the chest, abdomen and pelvis was performed following the standard protocol without IV contrast. COMPARISON:  01/24/2019 FINDINGS: CT CHEST FINDINGS Cardiovascular: Multifactorial degradation, including motion in the lower chest, patient arm position (not raised above the head), and lack of IV contrast. Aortic and branch vessel atherosclerosis. Tortuous thoracic aorta. Mild cardiomegaly. Multivessel coronary artery atherosclerosis. Mediastinum/Nodes: Left supraclavicular index node measures 2.3 cm on 07/02, increased from 1.3 cm on the prior. Low left jugular nodes measure up to 1.8 cm today versus 1.4 cm on the prior. Relatively similar multiple bilateral axillary nodes. Left paratracheal node measures 1.2 cm on 22/2, newly enlarged since the prior. Hilar regions poorly evaluated without intravenous contrast. Lungs/Pleura: No pleural fluid. Right greater than left subpleural pulmonary nodules are similar, likely subpleural lymph nodes. Example at up to 6 mm on 72/4 in the right upper lobe. New superior segment left lower lobe clustered nodular airspace and ground-glass opacity, including on 74/4. Musculoskeletal: Right upper extremity edema and subcutaneous gas are incompletely imaged, including on 38/2. Question intramuscular hematoma, including on 44/2. No acute osseous abnormality. CT ABDOMEN PELVIS FINDINGS Hepatobiliary: Multifactorial degradation continuing into the upper abdomen. Grossly normal noncontrast appearance of the liver, gallbladder. Pancreas: Grossly normal pancreas. Spleen: Normal in size, without focal abnormality. Adrenals/Urinary Tract: Normal adrenal glands. Favor renal vascular calcifications over renal calculi. No hydronephrosis. Bladder wall thickening is mild, at least partially felt to be due to underdistention. Stomach/Bowel: Grossly normal stomach. Colonic stool burden suggests constipation. Normal small bowel caliber. Vascular/Lymphatic: Aortic atherosclerosis. Bulky  abdominal adenopathy. Index left periaortic node measures 2.7 x 3.6 cm on 77/2 versus 2.2 x 2.8 cm at the same level on the prior. A dominant left periaortic nodal mass measures maximally 6.7 cm on 93/2 today versus 5.5 cm on the prior exam (when remeasured). Index left external iliac node measures 3.3 cm on 116/2 and is similar to on the prior exam (when remeasured). Reproductive: Moderate prostatomegaly. Other: No significant free fluid. Musculoskeletal: No acute osseous abnormality. Lumbosacral junction degenerative disc disease. IMPRESSION: 1. Multifactorial degradation, including lack of IV contrast, motion, patient arm position. 2. Since 01/24/2019, development of left lower lobe nodular airspace disease, suspicious for infection or aspiration. 3. Right upper extremity edema and gas with possible intramuscular hematoma. Correlate with recent trauma or attempted IV placement. If no correlate  history, deep venous thrombosis of the right upper extremity to would be a consideration and ultrasound suggested. 4. Progressive lymphoma within the neck, chest, abdomen, as detailed above. Pelvic adenopathy is relatively similar. 5. Prostatomegaly. Apparent bladder wall thickening is at least partially felt to be due to underdistention. Correlate for possible bladder outlet obstruction. 6. Coronary artery atherosclerosis. Aortic Atherosclerosis (ICD10-I70.0). 7.  Possible constipation. Electronically Signed   By: Abigail Miyamoto M.D.   On: 05/07/2019 16:57   CT Head Wo Contrast  Result Date: 05/06/2019 CLINICAL DATA:  Altered mental status, unclear cause EXAM: CT HEAD WITHOUT CONTRAST TECHNIQUE: Contiguous axial images were obtained from the base of the skull through the vertex without intravenous contrast. COMPARISON:  None. 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 No CT evident large vascular territory or cortically based infarction is seen. No evidence  of acute, hemorrhage, hydrocephalus, extra-axial collection or mass lesion/mass effect. Patchy areas of white matter hypoattenuation are most compatible with chronic microvascular angiopathy. Symmetric prominence of the ventricles, cisterns and sulci compatible with parenchymal volume loss. Vascular: Atherosclerotic calcification of the carotid siphons and intradural vertebral arteries. No hyperdense vessel. Skull: No calvarial fracture or suspicious osseous lesion. No scalp swelling or hematoma. Vascular calcium of the scalp, often seen in diabetes. Sinuses/Orbits: Paranasal sinuses and mastoid air cells are predominantly clear. Included orbital structures are unremarkable. Other: None IMPRESSION: 1. Multiple acquisition required due to patient motion artifact. 2. No convincing CT evidence of acute intracranial abnormality. 3. 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 4. Chronic microangiopathic changes with parenchymal volume loss. Intracranial atherosclerosis. Electronically Signed   By: Lovena Le M.D.   On: 05/06/2019 21:15   CT CHEST WO CONTRAST  Result Date: 05/07/2019 CLINICAL DATA:  Hyperparathyroidism. Hypercalcemia. Small B-cell lymphoma, on immune therapy. Pulmonary emboli in September. Altered mental status and fever. EXAM: CT CHEST, ABDOMEN AND PELVIS WITHOUT CONTRAST TECHNIQUE: Multidetector CT imaging of the chest, abdomen and pelvis was performed following the standard protocol without IV contrast. COMPARISON:  01/24/2019 FINDINGS: CT CHEST FINDINGS Cardiovascular: Multifactorial degradation, including motion in the lower chest, patient arm position (not raised above the head), and lack of IV contrast. Aortic and branch vessel atherosclerosis. Tortuous thoracic aorta. Mild cardiomegaly. Multivessel coronary artery atherosclerosis. Mediastinum/Nodes: Left supraclavicular index node measures 2.3 cm on 07/02, increased from 1.3 cm on the prior. Low left jugular  nodes measure up to 1.8 cm today versus 1.4 cm on the prior. Relatively similar multiple bilateral axillary nodes. Left paratracheal node measures 1.2 cm on 22/2, newly enlarged since the prior. Hilar regions poorly evaluated without intravenous contrast. Lungs/Pleura: No pleural fluid. Right greater than left subpleural pulmonary nodules are similar, likely subpleural lymph nodes. Example at up to 6 mm on 72/4 in the right upper lobe. New superior segment left lower lobe clustered nodular airspace and ground-glass opacity, including on 74/4. Musculoskeletal: Right upper extremity edema and subcutaneous gas are incompletely imaged, including on 38/2. Question intramuscular hematoma, including on 44/2. No acute osseous abnormality. CT ABDOMEN PELVIS FINDINGS Hepatobiliary: Multifactorial degradation continuing into the upper abdomen. Grossly normal noncontrast appearance of the liver, gallbladder. Pancreas: Grossly normal pancreas. Spleen: Normal in size, without focal abnormality. Adrenals/Urinary Tract: Normal adrenal glands. Favor renal vascular calcifications over renal calculi. No hydronephrosis. Bladder wall thickening is mild, at least partially felt to be due to underdistention. Stomach/Bowel: Grossly normal stomach. Colonic stool burden suggests constipation. Normal small bowel caliber. Vascular/Lymphatic: Aortic atherosclerosis. Bulky  abdominal adenopathy. Index left periaortic node measures 2.7 x 3.6 cm on 77/2 versus 2.2 x 2.8 cm at the same level on the prior. A dominant left periaortic nodal mass measures maximally 6.7 cm on 93/2 today versus 5.5 cm on the prior exam (when remeasured). Index left external iliac node measures 3.3 cm on 116/2 and is similar to on the prior exam (when remeasured). Reproductive: Moderate prostatomegaly. Other: No significant free fluid. Musculoskeletal: No acute osseous abnormality. Lumbosacral junction degenerative disc disease. IMPRESSION: 1. Multifactorial degradation,  including lack of IV contrast, motion, patient arm position. 2. Since 01/24/2019, development of left lower lobe nodular airspace disease, suspicious for infection or aspiration. 3. Right upper extremity edema and gas with possible intramuscular hematoma. Correlate with recent trauma or attempted IV placement. If no correlate history, deep venous thrombosis of the right upper extremity to would be a consideration and ultrasound suggested. 4. Progressive lymphoma within the neck, chest, abdomen, as detailed above. Pelvic adenopathy is relatively similar. 5. Prostatomegaly. Apparent bladder wall thickening is at least partially felt to be due to underdistention. Correlate for possible bladder outlet obstruction. 6. Coronary artery atherosclerosis. Aortic Atherosclerosis (ICD10-I70.0). 7.  Possible constipation. Electronically Signed   By: Abigail Miyamoto M.D.   On: 05/07/2019 16:57   DG Chest Portable 1 View  Result Date: 05/06/2019 CLINICAL DATA:  Fevers and altered mental status, history of lymphoma EXAM: PORTABLE CHEST 1 VIEW COMPARISON:  01/24/2019 CT, plain film from 11/13/2017 FINDINGS: Cardiac shadow is within normal limits. The lungs are well aerated bilaterally. No focal infiltrate or sizable effusion is seen. Aortic calcifications are noted. No acute bony abnormality is noted. Exuberant calcification at the first costochondral margins is noted bilaterally. IMPRESSION: No acute abnormality noted. Electronically Signed   By: Inez Catalina M.D.   On: 05/06/2019 22:18        Scheduled Meds: . amLODipine  5 mg Oral Daily  . aspirin  81 mg Oral QHS  . insulin aspart  0-9 Units Subcutaneous TID WC  . pravastatin  40 mg Oral QHS   Continuous Infusions: . ceFEPime (MAXIPIME) IV 2 g (05/08/19 0327)  . 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  Continue with vancomycin and cefepime while awaiting blood culture given his immunocompromise state  Covid negative Oncology input was appreciated Chest and pelvis CT scan completed , with progressive disease in the chest; and also abdomen pelvis [up to 6 cm bulky retroperitoneal adenopathy.].   Per oncology-? transformation high-grade tumor given hypercalcemia; as it is very unlikely for small cell lymphoma to cause hypercalcemia.   Ck LDH need a PET scan for further evaluation to rule out transformation can be done as outpt.  Possible aspiration  /infectious PNA- found on CT chest Possibly cause of fever Continue with iv abx.   Acute on chronic kidney disease stage 3a At baseline. Avoid nephrotoxic agent  Hypercalcemia related to malignancy. Stable.  on Zometa monthly with oncology  Rec. Vit D,  Chronic anemia/iron deficiency Gets outpatient IV venofer infusions pt noted to be FOBT positive since 03/01/2019 but has declined GI follow up. reluctant with GI evaluation as outpatient  Recommend IV iron by oncology  transfusion threshold <7   Hx of bilateral PE in 2019 previously on Eliquis but d/c due to worsening anemia He is febrile and tachy, but need to r/o infection before obtaining cta. As 02 sat stable.  Type 2 diabetes Sensitive sliding scale  Left BKA  has prosthetic leg in place wheelchair bound  Mild aspiration Risk- SLP rec. Dysphagia level 1 (puree) w/ Nectar liquids via cup/straw - monitoring and Supervision w/  all oral intake. General aspiration precautions and feeding support at meals. Reduce distractions at meals. Medication Administration: Crushed with puree(for safer swallowing)   DVT prophylaxis: scd Code Status:full Family Communication: none at bedside Disposition Plan: Patient came from home,, will likely return to home once medically stable Barrier to discharge patient: altered mental status not quite at baseline, will d/c 1:1 sit today, continue iv abx. Discharge: possibly on 05/09/19        LOS: 2 days   Time spent: 45 minutes with more than 50% on Enigma, MD [facility] Pager [phone]  If 7PM-7AM, please contact night-coverage [email or web address] Password [facility] 05/08/2019, 7:41 AM Patient ID: Kingmessiah Maharaj, male   DOB: Sep 09, 1950, 69 y.o.   MRN: [id number]

## 2019-05-08 NOTE — Consult Note (Signed)
[name] NOTE  Patient Care Team: [name], MD as PCP - General (Internal Medicine)  CHIEF COMPLAINTS/PURPOSE OF CONSULTATION: Small cell lymphoma  HISTORY OF PRESENTING ILLNESS: Patient extremely hard of hearing; no hearing aids.  Wife not available.  [name] 69 y.o.  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  Patient was recently evaluated in the clinic as a follow-up of small cell lymphoma/iron deficient anemia.  Patient was noted to have elevated calcium of 11/albumin 2.8; patient was confused.  Patient received Zometa in the clinic.  And then transferred to the emergency room for further evaluation.  Since being hospital patient's-calcium is improved at 9.7.   Renal function GFR 55.  Hemoglobin 8.1.  Currently 6.5.  Platelets 339.  Patient is currently confused unable to give any history.  Family not at the bedside.  Review of Systems  Unable to perform ROS: Medical condition     MEDICAL HISTORY:  Past Medical History:  Diagnosis Date  . Anxiety   . Arthritis   . 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 HOH (hard of hearing)   . Hypertension   . Osteomyelitis of toe ([place]) 06/08/11   right foot  . Seasonal allergies   . Type II diabetes mellitus ([place])    diet controlled    SURGICAL HISTORY: Past Surgical History:  Procedure Laterality Date  . AMPUTATION  06/08/2011   Procedure: AMPUTATION RAY;  Surgeon: Wylene Simmer, MD;  Location: [facility];  Service: Orthopedics;  Laterality: Right;  [place] Amputation  . [place]   "crushed"  . FRACTURE SURGERY    . 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 PILONIDAL CYST / SINUS EXCISION  1970's  . TOE AMPUTATION  06/08/11   partial; right great toe    SOCIAL HISTORY: Social History   Socioeconomic History  . Marital status: Married    Spouse  name: Not on file  . Number of children: Not on file  . Years of education: Not on file  . Highest education level: Not on file  Occupational History  . Not on file  Tobacco Use  . Smoking status: Former Smoker    Packs/day: 1.50    Years: 22.00    Pack years: 33.00    Types: Cigarettes    Quit date: 05/24/2011    Years since quitting: 7.9  . Smokeless tobacco: Former Systems developer    Types: [place] date: 03/07/1983  Substance and Sexual Activity  . Alcohol use: Yes    Comment: 06/08/11 "no liquor for 2 1/2 years; last beer 05/31/11"  . Drug use: No  . Sexual activity: Yes  Other Topics Concern  . Not on file  Social History Narrative   Married   Social Determinants of Health   Financial Resource Strain:   . Difficulty of Paying Living Expenses: Not on file  Food Insecurity:   . Worried About Charity fundraiser in the Last Year: Not on file  . Ran Out of Food in the Last Year: Not on file  Transportation Needs:   . Lack of Transportation (Medical): Not on file  . Lack of Transportation (Non-Medical): Not on file  Physical Activity:   . Days of Exercise per Week: Not on file  . Minutes of Exercise per Session: Not on  file  Stress:   . Feeling of Stress : Not on file  Social Connections:   . Frequency of Communication with Friends and Family: Not on file  . Frequency of Social Gatherings with Friends and Family: Not on file  . Attends Religious Services: Not on file  . Active Member of Clubs or Organizations: Not on file  . Attends Archivist Meetings: Not on file  . Marital Status: Not on file  Intimate Partner Violence:   . Fear of Current or Ex-Partner: Not on file  . Emotionally Abused: Not on file  . Physically Abused: Not on file  . Sexually Abused: Not on file    FAMILY HISTORY: Family History  Problem Relation Age of Onset  . Prostate cancer Father   . Hypertension Father   . Other Mother        VARICOSE VEINS    ALLERGIES:  is allergic to  penicillins.  MEDICATIONS:  Current Facility-Administered Medications  Medication Dose Route Frequency Provider Last Rate Last Admin  . acetaminophen (TYLENOL) suppository 650 mg  650 mg Rectal Q4H PRN Tu, Ching T, DO      . amLODipine (NORVASC) tablet 5 mg  5 mg Oral Daily Tu, Ching T, DO   5 mg at 05/08/19 0826  . aspirin chewable tablet 81 mg  81 mg Oral QHS Tu, Ching T, DO   81 mg at 05/07/19 2040  . ceFEPIme (MAXIPIME) 2 g in sodium chloride 0.9 % 100 mL IVPB  2 g Intravenous Q8H Hall, Scott A, RPH 200 mL/hr at 05/08/19 0327 2 g at 05/08/19 0327  . insulin aspart (novoLOG) injection 0-9 Units  0-9 Units Subcutaneous TID WC Tu, Ching T, DO   2 Units at 05/08/19 0825  . pravastatin (PRAVACHOL) tablet 40 mg  40 mg Oral QHS Tu, Ching T, DO   40 mg at 05/07/19 2040  . vancomycin (VANCOREADY) IVPB 750 mg/150 mL  750 mg Intravenous Q12H Hart Robinsons A, RPH 150 mL/hr at 05/07/19 2328 750 mg at 05/07/19 2328      .  PHYSICAL EXAMINATION:  Vitals:   05/08/19 0600 05/08/19 0754  BP: (!) 127/57 (!) 127/58  Pulse: 85 82  Resp: 17 (!) 22  Temp: 97.8 F (36.6 C) 97.8 F (36.6 C)  SpO2: 100% 98%   Filed Weights   05/06/19 1538  Weight: 171 lb (77.6 kg)    Physical Exam  Constitutional: He is well-developed, well-nourished, and in no distress.  HENT:  Head: Normocephalic and atraumatic.  Mouth/Throat: Oropharynx is clear and moist. No oropharyngeal exudate.  Eyes: Pupils are equal, round, and reactive to light.  Cardiovascular: Normal rate and regular rhythm.  Pulmonary/Chest: No respiratory distress. He has no wheezes.  Decreased breath sounds bilaterally bases.  Abdominal: Soft. Bowel sounds are normal. He exhibits no distension and no mass. There is no abdominal tenderness. There is no rebound and no guarding.  Musculoskeletal:        General: No tenderness or edema. Normal range of motion.     Cervical back: Normal range of motion and neck supple.     Comments: Left lower  extremity amputation.  Neurological:  Alert but oriented x0.  Skin: Skin is warm.     LABORATORY DATA:  I have reviewed the data as listed Lab Results  Component Value Date   WBC 6.5 05/07/2019   HGB 8.1 (L) 05/07/2019   HCT 27.9 (L) 05/07/2019   MCV 74.8 (L) 05/07/2019  PLT 339 05/07/2019   Recent Labs    05/06/19 1323 05/06/19 1323 05/06/19 1816 05/07/19 0451 05/08/19 0555  NA 136   < > 134* 136 137  K 4.5   < > 4.5 3.9 3.9  CL 100   < > 103 103 104  CO2 24   < > 21* 25 25  GLUCOSE 162*   < > 177* 166* 158*  BUN 34*   < > 31* 29* 29*  CREATININE 1.50*   < > 1.38* 1.42* 1.38*  CALCIUM 11.3*   < > 10.6* 10.3 9.7  GFRNONAA 47*   < > 53* 51* 53*  GFRAA 55*   < > >60 59* >60  PROT 6.2*  --  5.7* 6.3*  --   ALBUMIN 2.8*  --  2.6* 2.6*  --   AST 12*  --  14* 11*  --   ALT 9  --  11 10  --   ALKPHOS 54  --  47 49  --   BILITOT 0.4  --  0.8 0.6  --   BILIDIR  --   --  0.2  --   --   IBILI  --   --  0.6  --   --    < > = values in this interval not displayed.    RADIOGRAPHIC STUDIES: I have personally reviewed the radiological images as listed and agreed with the findings in the report. CT ABDOMEN PELVIS WO CONTRAST  Result Date: 05/07/2019 CLINICAL DATA:  Hyperparathyroidism. Hypercalcemia. Small B-cell lymphoma, on immune therapy. Pulmonary emboli in September. Altered mental status and fever. EXAM: CT CHEST, ABDOMEN AND PELVIS WITHOUT CONTRAST TECHNIQUE: Multidetector CT imaging of the chest, abdomen and pelvis was performed following the standard protocol without IV contrast. COMPARISON:  01/24/2019 FINDINGS: CT CHEST FINDINGS Cardiovascular: Multifactorial degradation, including motion in the lower chest, patient arm position (not raised above the head), and lack of IV contrast. Aortic and branch vessel atherosclerosis. Tortuous thoracic aorta. Mild cardiomegaly. Multivessel coronary artery atherosclerosis. Mediastinum/Nodes: Left supraclavicular index node measures 2.3  cm on 07/02, increased from 1.3 cm on the prior. Low left jugular nodes measure up to 1.8 cm today versus 1.4 cm on the prior. Relatively similar multiple bilateral axillary nodes. Left paratracheal node measures 1.2 cm on 22/2, newly enlarged since the prior. Hilar regions poorly evaluated without intravenous contrast. Lungs/Pleura: No pleural fluid. Right greater than left subpleural pulmonary nodules are similar, likely subpleural lymph nodes. Example at up to 6 mm on 72/4 in the right upper lobe. New superior segment left lower lobe clustered nodular airspace and ground-glass opacity, including on 74/4. Musculoskeletal: Right upper extremity edema and subcutaneous gas are incompletely imaged, including on 38/2. Question intramuscular hematoma, including on 44/2. No acute osseous abnormality. CT ABDOMEN PELVIS FINDINGS Hepatobiliary: Multifactorial degradation continuing into the upper abdomen. Grossly normal noncontrast appearance of the liver, gallbladder. Pancreas: Grossly normal pancreas. Spleen: Normal in size, without focal abnormality. Adrenals/Urinary Tract: Normal adrenal glands. Favor renal vascular calcifications over renal calculi. No hydronephrosis. Bladder wall thickening is mild, at least partially felt to be due to underdistention. Stomach/Bowel: Grossly normal stomach. Colonic stool burden suggests constipation. Normal small bowel caliber. Vascular/Lymphatic: Aortic atherosclerosis. Bulky abdominal adenopathy. Index left periaortic node measures 2.7 x 3.6 cm on 77/2 versus 2.2 x 2.8 cm at the same level on the prior. A dominant left periaortic nodal mass measures maximally 6.7 cm on 93/2 today versus 5.5 cm on the prior exam (when remeasured).  Index left external iliac node measures 3.3 cm on 116/2 and is similar to on the prior exam (when remeasured). Reproductive: Moderate prostatomegaly. Other: No significant free fluid. Musculoskeletal: No acute osseous abnormality. Lumbosacral junction  degenerative disc disease. IMPRESSION: 1. Multifactorial degradation, including lack of IV contrast, motion, patient arm position. 2. Since 01/24/2019, development of left lower lobe nodular airspace disease, suspicious for infection or aspiration. 3. Right upper extremity edema and gas with possible intramuscular hematoma. Correlate with recent trauma or attempted IV placement. If no correlate history, deep venous thrombosis of the right upper extremity to would be a consideration and ultrasound suggested. 4. Progressive lymphoma within the neck, chest, abdomen, as detailed above. Pelvic adenopathy is relatively similar. 5. Prostatomegaly. Apparent bladder wall thickening is at least partially felt to be due to underdistention. Correlate for possible bladder outlet obstruction. 6. Coronary artery atherosclerosis. Aortic Atherosclerosis (ICD10-I70.0). 7.  Possible constipation. Electronically Signed   By: Abigail Miyamoto M.D.   On: 05/07/2019 16:57   CT Head Wo Contrast  Result Date: 05/06/2019 CLINICAL DATA:  Altered mental status, unclear cause EXAM: CT HEAD WITHOUT CONTRAST TECHNIQUE: Contiguous axial images were obtained from the base of the skull through the vertex without intravenous contrast. COMPARISON:  None. 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 No CT evident large vascular territory or cortically based infarction is seen. No evidence of acute, hemorrhage, hydrocephalus, extra-axial collection or mass lesion/mass effect. Patchy areas of white matter hypoattenuation are most compatible with chronic microvascular angiopathy. Symmetric prominence of the ventricles, cisterns and sulci compatible with parenchymal volume loss. Vascular: Atherosclerotic calcification of the carotid siphons and intradural vertebral arteries. No hyperdense vessel. Skull: No calvarial fracture or suspicious osseous lesion. No scalp swelling or hematoma. Vascular  calcium of the scalp, often seen in diabetes. Sinuses/Orbits: Paranasal sinuses and mastoid air cells are predominantly clear. Included orbital structures are unremarkable. Other: None IMPRESSION: 1. Multiple acquisition required due to patient motion artifact. 2. No convincing CT evidence of acute intracranial abnormality. 3. 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 4. Chronic microangiopathic changes with parenchymal volume loss. Intracranial atherosclerosis. Electronically Signed   By: Lovena Le M.D.   On: 05/06/2019 21:15   CT CHEST WO CONTRAST  Result Date: 05/07/2019 CLINICAL DATA:  Hyperparathyroidism. Hypercalcemia. Small B-cell lymphoma, on immune therapy. Pulmonary emboli in September. Altered mental status and fever. EXAM: CT CHEST, ABDOMEN AND PELVIS WITHOUT CONTRAST TECHNIQUE: Multidetector CT imaging of the chest, abdomen and pelvis was performed following the standard protocol without IV contrast. COMPARISON:  01/24/2019 FINDINGS: CT CHEST FINDINGS Cardiovascular: Multifactorial degradation, including motion in the lower chest, patient arm position (not raised above the head), and lack of IV contrast. Aortic and branch vessel atherosclerosis. Tortuous thoracic aorta. Mild cardiomegaly. Multivessel coronary artery atherosclerosis. Mediastinum/Nodes: Left supraclavicular index node measures 2.3 cm on 07/02, increased from 1.3 cm on the prior. Low left jugular nodes measure up to 1.8 cm today versus 1.4 cm on the prior. Relatively similar multiple bilateral axillary nodes. Left paratracheal node measures 1.2 cm on 22/2, newly enlarged since the prior. Hilar regions poorly evaluated without intravenous contrast. Lungs/Pleura: No pleural fluid. Right greater than left subpleural pulmonary nodules are similar, likely subpleural lymph nodes. Example at up to 6 mm on 72/4 in the right upper lobe. New superior segment left lower lobe clustered nodular airspace and  ground-glass opacity, including on 74/4. Musculoskeletal: Right upper extremity edema and subcutaneous gas are incompletely  imaged, including on 38/2. Question intramuscular hematoma, including on 44/2. No acute osseous abnormality. CT ABDOMEN PELVIS FINDINGS Hepatobiliary: Multifactorial degradation continuing into the upper abdomen. Grossly normal noncontrast appearance of the liver, gallbladder. Pancreas: Grossly normal pancreas. Spleen: Normal in size, without focal abnormality. Adrenals/Urinary Tract: Normal adrenal glands. Favor renal vascular calcifications over renal calculi. No hydronephrosis. Bladder wall thickening is mild, at least partially felt to be due to underdistention. Stomach/Bowel: Grossly normal stomach. Colonic stool burden suggests constipation. Normal small bowel caliber. Vascular/Lymphatic: Aortic atherosclerosis. Bulky abdominal adenopathy. Index left periaortic node measures 2.7 x 3.6 cm on 77/2 versus 2.2 x 2.8 cm at the same level on the prior. A dominant left periaortic nodal mass measures maximally 6.7 cm on 93/2 today versus 5.5 cm on the prior exam (when remeasured). Index left external iliac node measures 3.3 cm on 116/2 and is similar to on the prior exam (when remeasured). Reproductive: Moderate prostatomegaly. Other: No significant free fluid. Musculoskeletal: No acute osseous abnormality. Lumbosacral junction degenerative disc disease. IMPRESSION: 1. Multifactorial degradation, including lack of IV contrast, motion, patient arm position. 2. Since 01/24/2019, development of left lower lobe nodular airspace disease, suspicious for infection or aspiration. 3. Right upper extremity edema and gas with possible intramuscular hematoma. Correlate with recent trauma or attempted IV placement. If no correlate history, deep venous thrombosis of the right upper extremity to would be a consideration and ultrasound suggested. 4. Progressive lymphoma within the neck, chest, abdomen, as  detailed above. Pelvic adenopathy is relatively similar. 5. Prostatomegaly. Apparent bladder wall thickening is at least partially felt to be due to underdistention. Correlate for possible bladder outlet obstruction. 6. Coronary artery atherosclerosis. Aortic Atherosclerosis (ICD10-I70.0). 7.  Possible constipation. Electronically Signed   By: Abigail Miyamoto M.D.   On: 05/07/2019 16:57   DG Chest Portable 1 View  Result Date: 05/06/2019 CLINICAL DATA:  Fevers and altered mental status, history of lymphoma EXAM: PORTABLE CHEST 1 VIEW COMPARISON:  01/24/2019 CT, plain film from 11/13/2017 FINDINGS: Cardiac shadow is within normal limits. The lungs are well aerated bilaterally. No focal infiltrate or sizable effusion is seen. Aortic calcifications are noted. No acute bony abnormality is noted. Exuberant calcification at the first costochondral margins is noted bilaterally. IMPRESSION: No acute abnormality noted. Electronically Signed   By: Inez Catalina M.D.   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  Currently discontinued given the clinical progression.  Chest and pelvis CT scan noncontrast done in the hospital shows-progressive disease in the chest; and also abdomen pelvis [up to 6 cm bulky retroperitoneal adenopathy.].  Question transformation high-grade tumor given hypercalcemia; as it is very unlikely for small cell lymphoma to cause hypercalcemia.  We will check LDH.  Patient will likely need a PET scan for further evaluation to rule out transformation.  #Hypercalcemia-concerning for malignancy related.  Recommend checking vitamin D levels; PTH related hormone; also PTH levels.  Status post Zometa improved.   #Mental  status changes-likely secondary to hypercalcemia; however patient is currently confused/delirium [multifactorial-hospitalization/lack of hearing aids]  # Low-grade fevers-chest CT-possible aspiration versus tumor fevers.  On antibiotics.  #Anemia hemoglobin 8-iron deficiency/fecal occult blood is positive.  Patient reluctant with GI evaluation outpatient.  Recommend IV iron.  #History of bilateral PE [sep 2019]-currently off  Eliquis secondary to worsening anemia.   Thank you Dr.Sahar for allowing me to participate in the care of your pleasant patient. Please do not hesitate to contact me with questions or concerns in the interim.  # I reviewed the blood work- with the patient in detail; also reviewed the imaging independently [as summarized above]; and with the patient in detail. Will discuss with patients wife.   All questions were answered. The patient knows to call the clinic with any problems, questions or concerns.    Cammie Sickle, MD 05/08/2019 8:46 AM

## 2019-05-08 NOTE — Progress Notes (Signed)
Pharmacy Antibiotic Note  [name] is a 69 y.o. male admitted on 05/06/2019 with fever of unknown origin.  Pharmacy has been consulted for Cefepime and Vancomycin dosing.  Pt received Vancomycin 1000mg  in ED. Hx lymphoma- SLL/CLL recently on ibrutinib   Plan: -Crcl 57 ml/mim- will adjust Cefepime from 2gm IV q8hrs to 2 gm IV q12h  -continue Vancomycin 750 mg IV Q 12 hrs. Goal AUC 400-550. Expected AUC: 505.4 SCr used: 1.38, expected trough ~ 15.9   Height: 6\' 1"  (185.4 cm) Weight: 171 lb (77.6 kg) IBW/kg (Calculated) : 79.9  Temp (24hrs), Avg:97.8 F (36.6 C), Min:97.6 F (36.4 C), Max:97.9 F (36.6 C)  Recent Labs  Lab 05/06/19 1323 05/06/19 1816 05/06/19 1903 05/07/19 0451 05/08/19 0555  WBC 8.6  --  6.9 6.5  --   CREATININE 1.50* 1.38*  --  1.42* 1.38*    Estimated Creatinine Clearance: 57 mL/min (A) (by C-G formula based on SCr of 1.38 mg/dL (H)).    Allergies  Allergen Reactions  . Penicillins Anaphylaxis and Other (See Comments)    Has patient had a PCN reaction causing immediate rash, facial/tongue/throat swelling, SOB or lightheadedness with hypotension: Unknown Has patient had a PCN reaction causing severe rash involving mucus membranes or skin necrosis: Unknown Has patient had a PCN reaction that required hospitalization: Unknown Has patient had a PCN reaction occurring within the last 10 years: No If all of the above answers are "NO", then may proceed with Cephalosporin use.     Antimicrobials this admission: Vancomycin 3/3 >>  Cefepime 3/3 >>   Dose adjustments this admission:   Microbiology results:  BCx:   UCx:    Sputum:    MRSA PCR:   Thank you for allowing pharmacy to be a part of this patient's care.  Malya Cirillo A 05/08/2019 9:13 AM

## 2019-05-08 NOTE — TOC Initial Note (Signed)
Transition of Care [facility]) - Initial/Assessment Note    Patient Details  Name: [name] MRN: [id number] Date of Birth: Jul 17, 1950  [facility] CM/SW Contact:    Su Hilt, RN Phone Number: 05/08/2019, 5:00 PM  Clinical Narrative:                 Met with the patient and his wife at the bedside, the patient is Hard Of hearing so his wife restated the conversation for him, He lives at home with his wife and is normally very independent at home with a prosthetic leg and a wheelchair, he is basically back to base line with cognition except maybe a little slower speaking per the wife, he states that he will agree to [facility] PT thru [facility] for a short time when he discharges to help him regain his strength.  I contacted [name] with [facility] and set it up, he has all the DME he needs at home, He has transportation with his wife, and is up to date with his PCP, no additional needs at this time  Expected Discharge Plan: [place] Barriers to Discharge: Continued Medical Work up   Patient Goals and CMS Choice Patient states their goals for this hospitalization and ongoing recovery are:: go home      Expected Discharge Plan and Services Expected Discharge Plan: [place]   Discharge Planning Services: CM Consult   Living arrangements for the past 2 months: [place]                 DME Arranged: N/A         HH Arranged: PT [place] Agency: [place] ([place]) Date HH Agency Contacted: 05/08/19 Time [place]: 1659 Representative spoke with at [place]: [name]  Prior Living Arrangements/Services Living arrangements for the past 2 months: [place] Lives with:: Spouse Patient language and need for interpreter reviewed:: Yes Do you feel safe going back to the place where you live?: Yes      Need for Family Participation in Patient Care: No (Comment) Care giver support system in  place?: Yes (comment) Current home services: DME(RW, Wheelchair, prostetic leg) Criminal Activity/Legal Involvement Pertinent to Current Situation/Hospitalization: No - Comment as needed  Activities of Daily Living      Permission Sought/Granted   Permission granted to share information with : Yes, Verbal Permission Granted              Emotional Assessment Appearance:: Appears stated age Attitude/Demeanor/Rapport: Engaged Affect (typically observed): Appropriate Orientation: : Oriented to  Time, Oriented to Situation, Oriented to Place, Oriented to Self Alcohol / Substance Use: Not Applicable Psych Involvement: No (comment)  Admission diagnosis:  Altered mental status, unspecified altered mental status type [R41.82] AMS (altered mental status) [R41.82] Patient Active Problem List   Diagnosis Date Noted  . Hypercalcemia of malignancy 05/06/2019  . AMS (altered mental status) 05/06/2019  . Iron deficiency anemia due to chronic blood loss 02/24/2019  . 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 Bilateral pulmonary embolism ([place]) 12/25/2017  . CKD (chronic kidney disease), stage III 12/25/2017  . Thoracic lymphadenopathy 12/25/2017  . PAD (peripheral artery disease) ([place]) 10/23/2011  . HLD (hyperlipidemia) 07/13/2011  . Osteomyelitis of toe of right foot ([place]) 06/26/2011  . 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 Diabetes mellitus, type 2 ([place]) 01/26/2009  . Essential hypertension 01/26/2009  . Below-knee amputation (  [place]) 01/26/2009   PCP:  [name], MD Pharmacy:   [facility] [address] [place] - [place] [address] [place] [address] Phone: [phone] Fax: [place], [place] [address] Phone: [phone] Fax: [phone]     Social Determinants of Health (SDOH) Interventions     Readmission Risk Interventions No flowsheet data found.

## 2019-05-08 NOTE — Progress Notes (Signed)
Speech Language Pathology Treatment: Dysphagia  Patient Details Name: [name] MRN: [id number] DOB: Jan 24, 1951 Today's Date: 05/08/2019 Time: 0915-0950 SLP Time Calculation (min) (ACUTE ONLY): 35 min  Assessment / Plan / Recommendation Clinical Impression  Pt seen today for ongoing assessment of swallowing; toleration of current dysphagia diet and readiness to upgrade diet. Pt was sitting up in bed (after support positioning) and feeding self his breakfast meal. He presented w/ a calmer manner and w/ more verbally engagement w/ staff. Some confusion in verbal responses noted; distraction.  Pt continues to present w/ a somewhat declined Cognitive status w/ min Confusion but much improved from initial evaluation. Pt was admitted w/ dx of Altered mentation status in the setting of small cell B-cell lymphoma, worsening anemia and suspected infection. Pt has been agitated and combative during this admission requiring Medications to calm; Sitter remains present in room this am. Pt appears to present w/ Mild+ oropharyngeal phase dysphagia suspect impacted by pt's Cognitive status/decline currently. With modified diet of Purees and Nectar consistency liquids, pt's swallowing function was grossly wfl w/ No overt clinical s/s of aspiration noted as he fed himself. Pt consumed trials w/ No decline in resipratory status, no coughing, and no wet vocal quality noted during/post trials. Oral phase was grossly [facility] during bolus management and A-P transfer for swallowing. Verbal cues for encouragement/redirection to task given intermittently, and pt was encouraged to take his Time to allow for oral clearing b/t trials. Reducing distractions in room was done as well(less talking). Pt held Cup and fed self w/ setup support.  Recommend continue a Pureed diet w/ Nectar liquids another day to allow for continued improvements in medical and Cognitive status'; general aspiration precautions w/ all oral intake; Pills  Crushed in Puree for safer swallowing (d/t Cognitive decline). Support at meals w/ feeding d/t weakness and Cognitive status. Pt benefits from Supervision during all oral intake; reducing distractions in room. ST services will f/u w/ trials to upgrade as appropriate and safe for pt w/ possible need for MBSS next 1-2 days.  Addendum: discussed pt's progress and POC w/ Wife who was in room later in day after lunch. She agreed.      HPI HPI: Pt is a 69 y.o. 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  Wife reports that about 6 weeks ago he began iron infusion due to worsening anemia and that after every infusion he would have incontinence for a few days which will eventually resolve.  However his last infusion 2 weeks ago she noticed that he continued to have persistent incontinence.  He also appeared more fatigue and was sleeping more than usual.  He was very weak and almost had several falls.  Patient also told his wife at one point that he felt "confused."  He seems to be having trouble with speaking d/t the confusion.  He was recently diagnosed with hearing impairment and has not yet received his hearing aids.  Wife reported pt has had Weight loss.  Pt was admitted w/ dx of Altered mentation status in the setting of small cell B-cell lymphoma w/ worsening anemia and suspected infection.  Pt was initially agitated and combative requiring Medication for the agitation.  Noted Imaging results including Chest CT revealing "Progressive  lymphoma within the neck, chest, abdomen".       SLP Plan  Continue with current plan of care       Recommendations  Diet recommendations: Dysphagia 1 (puree);Nectar-thick liquid Liquids  provided via: Cup Medication Administration: Crushed with puree(as able for safer swallowing) Supervision: Patient able to self feed;Staff to assist with self feeding;Intermittent supervision to cue for compensatory strategies Compensations: Minimize environmental distractions;Slow rate;Small sips/bites;Lingual sweep for clearance of pocketing;Multiple dry swallows after each bite/sip;Follow solids with liquid Postural Changes and/or Swallow Maneuvers: Seated upright 90 degrees;Upright 30-60 min after meal                General recommendations: (Dietician f/u) Oral Care Recommendations: Oral care BID;Oral care before and after PO;Staff/trained caregiver to provide oral care Follow up Recommendations: (TBD) SLP Visit Diagnosis: Dysphagia, oropharyngeal phase (R13.12) Plan: Continue with current plan of care       [place], [place], CCC-SLP Nija Koopman 05/08/2019, 2:21 PM

## 2019-05-09 ENCOUNTER — Other Ambulatory Visit: Payer: Self-pay | Admitting: *Deleted

## 2019-05-09 ENCOUNTER — Telehealth: Payer: Self-pay | Admitting: Internal Medicine

## 2019-05-09 ENCOUNTER — Inpatient Hospital Stay: Payer: Medicare Other

## 2019-05-09 DIAGNOSIS — C8308 Small cell B-cell lymphoma, lymph nodes of multiple sites: Secondary | ICD-10-CM

## 2019-05-09 DIAGNOSIS — D500 Iron deficiency anemia secondary to blood loss (chronic): Secondary | ICD-10-CM

## 2019-05-09 LAB — GLUCOSE, CAPILLARY
Glucose-Capillary: 170 mg/dL — ABNORMAL HIGH (ref 70–99)
Glucose-Capillary: 213 mg/dL — ABNORMAL HIGH (ref 70–99)

## 2019-05-09 LAB — BASIC METABOLIC PANEL
Anion gap: 7 (ref 5–15)
BUN: 33 mg/dL — ABNORMAL HIGH (ref 8–23)
CO2: 25 mmol/L (ref 22–32)
Calcium: 9.5 mg/dL (ref 8.9–10.3)
Chloride: 106 mmol/L (ref 98–111)
Creatinine, Ser: 1.48 mg/dL — ABNORMAL HIGH (ref 0.61–1.24)
GFR calc Af Amer: 56 mL/min — ABNORMAL LOW (ref >60)
GFR calc non Af Amer: 48 mL/min — ABNORMAL LOW (ref >60)
Glucose, Bld: 186 mg/dL — ABNORMAL HIGH (ref 70–99)
Potassium: 3.8 mmol/L (ref 3.5–5.1)
Sodium: 138 mmol/L (ref 135–145)

## 2019-05-09 LAB — PTH, INTACT AND CALCIUM
Calcium, Total (PTH): 9.7 mg/dL (ref 8.6–10.2)
PTH: 7 pg/mL — ABNORMAL LOW (ref 15–65)

## 2019-05-09 LAB — CALCITRIOL (1,25 DI-OH VIT D): Vit D, 1,25-Dihydroxy: 46.1 pg/mL (ref 19.9–79.3)

## 2019-05-09 MED ORDER — VANCOMYCIN HCL 1250 MG/250ML IV SOLN
1250.0000 mg | INTRAVENOUS | Status: DC
  Filled 2019-05-09: qty 250

## 2019-05-09 MED ORDER — LEVOFLOXACIN 500 MG PO TABS: 500.0000 mg | ORAL_TABLET | Freq: Every day | ORAL | 0 refills | Status: AC

## 2019-05-09 NOTE — Progress Notes (Signed)
[name]   DOB:[birth year]   [id number]    Subjective: Patient is confused.  He is alone.  As per the nursing staff he was sleeping.  Otherwise no acute issues.  Objective:  Vitals:   05/09/19 0817 05/09/19 0930  BP: 130/63 121/63  Pulse: 83 85  Resp: 18   Temp: 97.7 F (36.5 C)   SpO2: 100%      Intake/Output Summary (Last 24 hours) at 05/09/2019 1647 Last data filed at 05/09/2019 0100 Gross per 24 hour  Intake --  Output 550 ml  Net -550 ml    Physical Exam  Constitutional:  Patient is confused.  Otherwise alert.  Resting in bed comfortably.  As per the nursing staff patient was confused overnight  HENT:  Head: Normocephalic and atraumatic.  Mouth/Throat: Oropharynx is clear and moist. No oropharyngeal exudate.  Eyes: Pupils are equal, round, and reactive to light.  Cardiovascular: Normal rate and regular rhythm.  Pulmonary/Chest: No respiratory distress. He has no wheezes.  Decreased breath sounds.  At the bases  Abdominal: Soft. Bowel sounds are normal. He exhibits no distension and no mass. There is no abdominal tenderness. There is no rebound and no guarding.  Musculoskeletal:        General: No tenderness or edema. Normal range of motion.     Cervical back: Normal range of motion and neck supple.  Neurological: He is alert.  Oriented x0-1.   Skin: Skin is warm.     Labs:  Lab Results  Component Value Date   WBC 6.5 05/07/2019   HGB 8.1 (L) 05/07/2019   HCT 27.9 (L) 05/07/2019   MCV 74.8 (L) 05/07/2019   PLT 339 05/07/2019   NEUTROABS 4.7 05/06/2019    Lab Results  Component Value Date   NA 138 05/09/2019   K 3.8 05/09/2019   CL 106 05/09/2019   CO2 25 05/09/2019    Studies:  No results found.  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  Currently discontinued given the clinical progression.  Chest and pelvis CT scan noncontrast done in the hospital shows-progressive disease in the chest; and also abdomen pelvis [up to 6 cm bulky retroperitoneal adenopathy.].  Question transformation high-grade tumor given hypercalcemia; as it is very unlikely for small cell lymphoma to cause hypercalcemia-await PET scan outpatient.  #Hypercalcemia-concerning for malignancy related.Status post Zometa improved.  Monitor outpatient.   #Mental status changes-likely secondary to hypercalcemia; however patient is currently confused/delirium [multifactorial-hospitalization/lack of hearing aids]  #Anemia hemoglobin 8-iron deficiency/fecal occult blood is positive is post IV iron.  Outpatient GI evaluation  #History of bilateral PE [sep 2019]-currently off Eliquis secondary to worsening anemia.   #The patient could be potentially discharged home if clinically stable.  Will follow up in the cancer center/next week the PET scan prior.   Cammie Sickle, MD 05/09/2019  4:47 PM

## 2019-05-09 NOTE — Progress Notes (Signed)
Received MD order to discharge patient to home reviewed home meds with patient and wife along with prescriptions and follow up appointments

## 2019-05-09 NOTE — Discharge Summary (Signed)
[name] [id number] DOB: 1950-07-17 DOA: 05/06/2019  PCP: [name], MD  Admit date: 05/06/2019 Discharge date: 05/09/2019  Admitted From: Home Disposition: Home  Recommendations for Outpatient Follow-up:  1. Follow up with PCP in 1 week 2. Please obtain BMP/CBC in one week 3. Follow-up with oncology Dr. Rogue Bussing in 1 week  Home Health: Yes   Discharge Condition:Stable CODE STATUS:full  Diet recommendation: Dysphagia 1 with nectar-thick liquids.  Brief/Interim Summary: Per HPI: [name] is a 69 y.o. 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 He presented to his oncology office for a Zometa infusion and was noted to be febrile up to 101.9 and was sent to ED for further evaluation.  He was found with fever and tachycardia.  Blood cultures to date was negative.  He was negative.  Was consulted.  They recommended CT of chest abdomen pelvis as he was supposed to have that as outpatient.  CT does reveal progressive lymphoma with full results below.  Also there is a left lower lobe airspace disease that was suspicious for either an infection or aspiration.  Patient was started on IV antibiotics.  He remained afebrile.  He had a speech swallow eval and they felt he was at risk and place patient on dysphagia level 1 (.puree)  With nectar liquid.  He went to get an BSS but he was combative and did not want the test done so it was aborted.  He will have speech therapy as outpatient.  He also received IV iron by oncology for his chronic anemia.  He was found with hypercalcemia possibly related to his malignancy.  He is receiving monthly Zometa by oncology.  Hypercalcemia did improve with a calcium 9.5 on blood work  today with IV fluids.  Was concerned there is a question of transformation to high-grade tumor given hypercalcemia as it is very unlikely for small cell lymphoma to cause hypercalcemia.  He will need a PET scan for further evaluation to rule out transformation it which can be done as outpatient.  He was found with mild acute on chronic kidney disease stage IIIa which is at baseline now.  He is stable to be discharged home today.   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)   Iron deficiency anemia due to chronic blood loss   Hypercalcemia of malignancy    Discharge Instructions  Discharge Instructions    Diet - low sodium heart healthy   Complete by: As directed    Increase activity slowly   Complete by: As directed      Allergies as of 05/09/2019      Reactions   Penicillins Anaphylaxis, Other (See Comments)   Has patient had a PCN reaction causing immediate rash, facial/tongue/throat swelling, SOB or lightheadedness with hypotension: Unknown Has patient had a PCN reaction causing severe rash involving mucus membranes or skin necrosis: Unknown Has patient had a PCN reaction that required hospitalization: Unknown Has patient had a PCN reaction occurring within the last 10 years: No If all of the above answers  are "NO", then may proceed with Cephalosporin use.      Medication List    STOP taking these medications   allopurinol 300 MG tablet Commonly known as: ZYLOPRIM   apixaban 5 MG Tabs tablet Commonly known as: Eliquis   aspirin 81 MG chewable tablet   lisinopril 40 MG tablet Commonly known as: ZESTRIL   Saw Palmetto 450 MG Caps     TAKE these medications   amLODipine 5 MG tablet Commonly known as: NORVASC Take 1 tablet (5 mg total) by mouth  daily.   ECHINACEA PO Take 760 mg by mouth daily.   fluticasone 50 MCG/ACT nasal spray Commonly known as: FLONASE Place 2 sprays into both nostrils daily as needed for allergies or rhinitis.   Ginseng 100 MG Caps Take 100 mg by mouth daily.   Imbruvica 420 MG Tabs Generic drug: Ibrutinib TAKE 1 TABLET (420 MG) BY MOUTH DAILY.   Iron-Vitamin C 65-125 MG Tabs Take 1 tablet by mouth. VITRON C   Januvia 100 MG tablet Generic drug: sitaGLIPtin Take 100 mg by mouth at bedtime.   levofloxacin 500 MG tablet Commonly known as: LEVAQUIN Take 1 tablet (500 mg total) by mouth daily for 3 days.   metFORMIN 500 MG tablet Commonly known as: GLUCOPHAGE Take 1,000 mg by mouth 2 (two) times daily.   mupirocin ointment 2 % Commonly known as: BACTROBAN APPLY WITH Q TIP THREE TIMES DAILY AS NEEDED FOR DRYNESS AND CRUSTING   Omega 3 500 500 MG Caps Take 500 mg by mouth daily.   ondansetron 4 MG tablet Commonly known as: ZOFRAN TAKE 1 TABLET BY MOUTH EVERY 8 HOURS AS NEEDED FOR NAUSEA FOR VOMITING   ONE TOUCH ULTRA TEST test strip Generic drug: glucose blood USE 1 STRIP TO CHECK GLUCOSE ONCE A WEEK   pravastatin 40 MG tablet Commonly known as: PRAVACHOL Take 40 mg by mouth at bedtime.            Durable Medical Equipment  (From admission, onward)         Start     Ordered   05/09/19 1046  For home use only DME lightweight manual wheelchair with seat cushion  Once    Comments: Patient suffers from weakness, wheelchair bound at home, below knee amputation which impairs their ability to perform daily activities like ADLs in the home.  A walking aid will not resolve issue with performing activities of daily living. A wheelchair will allow patient to safely perform daily activities. Patient is not able to propel themselves in the home using a standard weight wheelchair due to weakness. Patient can self propel in the lightweight wheelchair. Length of need 99 months. Accessories:  elevating leg rests ELRs, wheel locks, extensions and anti-tippers, back cushion.   05/09/19 1049         Follow-up Information    Cammie Sickle, MD Follow up in 1 week(s).   Specialties: Internal Medicine, Oncology Contact information: [place] [address] [phone]        [name], MD Follow up in 1 week(s).   Specialty: Internal Medicine Contact information: [address] [place] [address] [phone]          Allergies  Allergen Reactions  . Penicillins Anaphylaxis and Other (See Comments)    Has patient had a PCN reaction causing immediate rash, facial/tongue/throat swelling, SOB or lightheadedness with hypotension: Unknown Has patient had a PCN reaction causing severe rash involving  mucus membranes or skin necrosis: Unknown Has patient had a PCN reaction that required hospitalization: Unknown Has patient had a PCN reaction occurring within the last 10 years: No If all of the above answers are "NO", then may proceed with Cephalosporin use.     Consultations:  Oncology   Procedures/Studies: CT ABDOMEN PELVIS WO CONTRAST  Result Date: 05/07/2019 CLINICAL DATA:  Hyperparathyroidism. Hypercalcemia. Small B-cell lymphoma, on immune therapy. Pulmonary emboli in September. Altered mental status and fever. EXAM: CT CHEST, ABDOMEN AND PELVIS WITHOUT CONTRAST TECHNIQUE: Multidetector CT imaging of the chest, abdomen and pelvis was performed following the standard protocol without IV contrast. COMPARISON:  01/24/2019 FINDINGS: CT CHEST FINDINGS Cardiovascular: Multifactorial degradation, including motion in the lower chest, patient arm position (not raised above the head), and lack of IV contrast. Aortic and branch vessel atherosclerosis. Tortuous thoracic aorta. Mild cardiomegaly. Multivessel coronary artery atherosclerosis. Mediastinum/Nodes: Left supraclavicular index node measures 2.3 cm on 07/02, increased from 1.3 cm on  the prior. Low left jugular nodes measure up to 1.8 cm today versus 1.4 cm on the prior. Relatively similar multiple bilateral axillary nodes. Left paratracheal node measures 1.2 cm on 22/2, newly enlarged since the prior. Hilar regions poorly evaluated without intravenous contrast. Lungs/Pleura: No pleural fluid. Right greater than left subpleural pulmonary nodules are similar, likely subpleural lymph nodes. Example at up to 6 mm on 72/4 in the right upper lobe. New superior segment left lower lobe clustered nodular airspace and ground-glass opacity, including on 74/4. Musculoskeletal: Right upper extremity edema and subcutaneous gas are incompletely imaged, including on 38/2. Question intramuscular hematoma, including on 44/2. No acute osseous abnormality. CT ABDOMEN PELVIS FINDINGS Hepatobiliary: Multifactorial degradation continuing into the upper abdomen. Grossly normal noncontrast appearance of the liver, gallbladder. Pancreas: Grossly normal pancreas. Spleen: Normal in size, without focal abnormality. Adrenals/Urinary Tract: Normal adrenal glands. Favor renal vascular calcifications over renal calculi. No hydronephrosis. Bladder wall thickening is mild, at least partially felt to be due to underdistention. Stomach/Bowel: Grossly normal stomach. Colonic stool burden suggests constipation. Normal small bowel caliber. Vascular/Lymphatic: Aortic atherosclerosis. Bulky abdominal adenopathy. Index left periaortic node measures 2.7 x 3.6 cm on 77/2 versus 2.2 x 2.8 cm at the same level on the prior. A dominant left periaortic nodal mass measures maximally 6.7 cm on 93/2 today versus 5.5 cm on the prior exam (when remeasured). Index left external iliac node measures 3.3 cm on 116/2 and is similar to on the prior exam (when remeasured). Reproductive: Moderate prostatomegaly. Other: No significant free fluid. Musculoskeletal: No acute osseous abnormality. Lumbosacral junction degenerative disc disease. IMPRESSION: 1.  Multifactorial degradation, including lack of IV contrast, motion, patient arm position. 2. Since 01/24/2019, development of left lower lobe nodular airspace disease, suspicious for infection or aspiration. 3. Right upper extremity edema and gas with possible intramuscular hematoma. Correlate with recent trauma or attempted IV placement. If no correlate history, deep venous thrombosis of the right upper extremity to would be a consideration and ultrasound suggested. 4. Progressive lymphoma within the neck, chest, abdomen, as detailed above. Pelvic adenopathy is relatively similar. 5. Prostatomegaly. Apparent bladder wall thickening is at least partially felt to be due to underdistention. Correlate for possible bladder outlet obstruction. 6. Coronary artery atherosclerosis. Aortic Atherosclerosis (ICD10-I70.0). 7.  Possible constipation. Electronically Signed   By: Abigail Miyamoto M.D.   On: 05/07/2019 16:57   CT Head Wo Contrast  Result Date: 05/06/2019 CLINICAL DATA:  Altered mental status, unclear cause EXAM: CT HEAD WITHOUT CONTRAST TECHNIQUE: Contiguous axial  images were obtained from the base of the skull through the vertex without intravenous contrast. COMPARISON:  None. 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 No CT evident large vascular territory or cortically based infarction is seen. No evidence of acute, hemorrhage, hydrocephalus, extra-axial collection or mass lesion/mass effect. Patchy areas of white matter hypoattenuation are most compatible with chronic microvascular angiopathy. Symmetric prominence of the ventricles, cisterns and sulci compatible with parenchymal volume loss. Vascular: Atherosclerotic calcification of the carotid siphons and intradural vertebral arteries. No hyperdense vessel. Skull: No calvarial fracture or suspicious osseous lesion. No scalp swelling or hematoma. Vascular calcium of the scalp, often seen in  diabetes. Sinuses/Orbits: Paranasal sinuses and mastoid air cells are predominantly clear. Included orbital structures are unremarkable. Other: None IMPRESSION: 1. Multiple acquisition required due to patient motion artifact. 2. No convincing CT evidence of acute intracranial abnormality. 3. 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 4. Chronic microangiopathic changes with parenchymal volume loss. Intracranial atherosclerosis. Electronically Signed   By: Lovena Le M.D.   On: 05/06/2019 21:15   CT CHEST WO CONTRAST  Result Date: 05/07/2019 CLINICAL DATA:  Hyperparathyroidism. Hypercalcemia. Small B-cell lymphoma, on immune therapy. Pulmonary emboli in September. Altered mental status and fever. EXAM: CT CHEST, ABDOMEN AND PELVIS WITHOUT CONTRAST TECHNIQUE: Multidetector CT imaging of the chest, abdomen and pelvis was performed following the standard protocol without IV contrast. COMPARISON:  01/24/2019 FINDINGS: CT CHEST FINDINGS Cardiovascular: Multifactorial degradation, including motion in the lower chest, patient arm position (not raised above the head), and lack of IV contrast. Aortic and branch vessel atherosclerosis. Tortuous thoracic aorta. Mild cardiomegaly. Multivessel coronary artery atherosclerosis. Mediastinum/Nodes: Left supraclavicular index node measures 2.3 cm on 07/02, increased from 1.3 cm on the prior. Low left jugular nodes measure up to 1.8 cm today versus 1.4 cm on the prior. Relatively similar multiple bilateral axillary nodes. Left paratracheal node measures 1.2 cm on 22/2, newly enlarged since the prior. Hilar regions poorly evaluated without intravenous contrast. Lungs/Pleura: No pleural fluid. Right greater than left subpleural pulmonary nodules are similar, likely subpleural lymph nodes. Example at up to 6 mm on 72/4 in the right upper lobe. New superior segment left lower lobe clustered nodular airspace and ground-glass opacity, including on 74/4.  Musculoskeletal: Right upper extremity edema and subcutaneous gas are incompletely imaged, including on 38/2. Question intramuscular hematoma, including on 44/2. No acute osseous abnormality. CT ABDOMEN PELVIS FINDINGS Hepatobiliary: Multifactorial degradation continuing into the upper abdomen. Grossly normal noncontrast appearance of the liver, gallbladder. Pancreas: Grossly normal pancreas. Spleen: Normal in size, without focal abnormality. Adrenals/Urinary Tract: Normal adrenal glands. Favor renal vascular calcifications over renal calculi. No hydronephrosis. Bladder wall thickening is mild, at least partially felt to be due to underdistention. Stomach/Bowel: Grossly normal stomach. Colonic stool burden suggests constipation. Normal small bowel caliber. Vascular/Lymphatic: Aortic atherosclerosis. Bulky abdominal adenopathy. Index left periaortic node measures 2.7 x 3.6 cm on 77/2 versus 2.2 x 2.8 cm at the same level on the prior. A dominant left periaortic nodal mass measures maximally 6.7 cm on 93/2 today versus 5.5 cm on the prior exam (when remeasured). Index left external iliac node measures 3.3 cm on 116/2 and is similar to on the prior exam (when remeasured). Reproductive: Moderate prostatomegaly. Other: No significant free fluid. Musculoskeletal: No acute osseous abnormality. Lumbosacral junction degenerative disc disease. IMPRESSION: 1. Multifactorial degradation, including lack of IV contrast, motion, patient arm position. 2. Since 01/24/2019, development of left lower lobe  nodular airspace disease, suspicious for infection or aspiration. 3. Right upper extremity edema and gas with possible intramuscular hematoma. Correlate with recent trauma or attempted IV placement. If no correlate history, deep venous thrombosis of the right upper extremity to would be a consideration and ultrasound suggested. 4. Progressive lymphoma within the neck, chest, abdomen, as detailed above. Pelvic adenopathy is relatively  similar. 5. Prostatomegaly. Apparent bladder wall thickening is at least partially felt to be due to underdistention. Correlate for possible bladder outlet obstruction. 6. Coronary artery atherosclerosis. Aortic Atherosclerosis (ICD10-I70.0). 7.  Possible constipation. Electronically Signed   By: Abigail Miyamoto M.D.   On: 05/07/2019 16:57   DG Chest Portable 1 View  Result Date: 05/06/2019 CLINICAL DATA:  Fevers and altered mental status, history of lymphoma EXAM: PORTABLE CHEST 1 VIEW COMPARISON:  01/24/2019 CT, plain film from 11/13/2017 FINDINGS: Cardiac shadow is within normal limits. The lungs are well aerated bilaterally. No focal infiltrate or sizable effusion is seen. Aortic calcifications are noted. No acute bony abnormality is noted. Exuberant calcification at the first costochondral margins is noted bilaterally. IMPRESSION: No acute abnormality noted. Electronically Signed   By: Inez Catalina M.D.   On: 05/06/2019 22:18       Subjective: Patient has no complaints.  He is just annoyed that he is here and wants to go home.  Discharge Exam: Vitals:   05/09/19 0817 05/09/19 0930  BP: 130/63 121/63  Pulse: 83 85  Resp: 18   Temp: 97.7 F (36.5 C)   SpO2: 100%    Vitals:   05/08/19 1644 05/09/19 0001 05/09/19 0817 05/09/19 0930  BP: (!) 134/59 (!) 143/60 130/63 121/63  Pulse: 91 85 83 85  Resp: 18 19 18    Temp:  97.9 F (36.6 C) 97.7 F (36.5 C)   TempSrc:  Oral Oral   SpO2: 97% 99% 100%   Weight:      Height:        General: Pt is alert, awake, not in acute distress Cardiovascular: RRR, S1/S2 +, no rubs, no gallops Respiratory: CTA bilaterally, no wheezing, no rhonchi Abdominal: Soft, NT, ND, bowel sounds + Extremities: no edema, no cyanosis    The results of significant diagnostics from this hospitalization (including imaging, microbiology, ancillary and laboratory) are listed below for reference.     Microbiology: Recent Results (from the past 240 hour(s))  SARS  CORONAVIRUS 2 (TAT 6-24 HRS) Nasopharyngeal Nasopharyngeal Swab     Status: None   Collection Time: 05/06/19 11:49 PM   Specimen: Nasopharyngeal Swab  Result Value Ref Range Status   SARS Coronavirus 2 NEGATIVE NEGATIVE Final    Comment: (NOTE) SARS-CoV-2 target nucleic acids are NOT DETECTED. The SARS-CoV-2 RNA is generally detectable in upper and lower respiratory specimens during the acute phase of infection. Negative results do not preclude SARS-CoV-2 infection, do not rule out co-infections with other pathogens, and should not be used as the sole basis for treatment or other patient management decisions. Negative results must be combined with clinical observations, patient history, and epidemiological information. The expected result is Negative. Fact Sheet for Patients: SugarRoll.be Fact Sheet for Healthcare Providers: https://www.woods-mathews.com/ This test is not yet approved or cleared by the [place] FDA and  has been authorized for detection and/or diagnosis of SARS-CoV-2 by FDA under an Emergency Use Authorization (EUA). This EUA will remain  in effect (meaning this test can be used) for the duration of the COVID-19 declaration under Section 56 4(b)(1) of the Act, 21 U.S.C.  section 360bbb-3(b)(1), unless the authorization is terminated or revoked sooner. Performed at [facility], [place] [address], [place], [place] [address]   Respiratory Panel by PCR     Status: None   Collection Time: 05/06/19 11:49 PM   Specimen: Nasopharyngeal Swab; Respiratory  Result Value Ref Range Status   Adenovirus NOT DETECTED NOT DETECTED Final   Coronavirus 229E NOT DETECTED NOT DETECTED Final    Comment: (NOTE) The Coronavirus on the Respiratory Panel, DOES NOT test for the novel  Coronavirus (2019 nCoV)    Coronavirus HKU1 NOT DETECTED NOT DETECTED Final   Coronavirus NL63 NOT DETECTED NOT DETECTED Final   Coronavirus OC43 NOT  DETECTED NOT DETECTED Final   Metapneumovirus NOT DETECTED NOT DETECTED Final   Rhinovirus / Enterovirus NOT DETECTED NOT DETECTED Final   Influenza A NOT DETECTED NOT DETECTED Final   Influenza B NOT DETECTED NOT DETECTED Final   Parainfluenza Virus 1 NOT DETECTED NOT DETECTED Final   Parainfluenza Virus 2 NOT DETECTED NOT DETECTED Final   Parainfluenza Virus 3 NOT DETECTED NOT DETECTED Final   Parainfluenza Virus 4 NOT DETECTED NOT DETECTED Final   Respiratory Syncytial Virus NOT DETECTED NOT DETECTED Final   Bordetella pertussis NOT DETECTED NOT DETECTED Final   Chlamydophila pneumoniae NOT DETECTED NOT DETECTED Final   Mycoplasma pneumoniae NOT DETECTED NOT DETECTED Final    Comment: Performed at [facility], [place]. [address] [place], [place] [address]  CULTURE, BLOOD (ROUTINE X 2) w Reflex to ID Panel     Status: None (Preliminary result)   Collection Time: 05/07/19  4:51 AM   Specimen: BLOOD  Result Value Ref Range Status   Specimen Description BLOOD LEFT ANTECUBITAL  Final   Special Requests   Final    BOTTLES DRAWN AEROBIC AND ANAEROBIC Blood Culture adequate volume   Culture   Final    NO GROWTH 2 DAYS Performed at [facility], [address], [place], [place] [address]    Report Status PENDING  Incomplete  CULTURE, BLOOD (ROUTINE X 2) w Reflex to ID Panel     Status: None (Preliminary result)   Collection Time: 05/07/19  5:00 AM   Specimen: BLOOD  Result Value Ref Range Status   Specimen Description BLOOD BLOOD LEFT HAND  Final   Special Requests   Final    BOTTLES DRAWN AEROBIC AND ANAEROBIC Blood Culture adequate volume   Culture   Final    NO GROWTH 2 DAYS Performed at [facility], [address], [place], [place] [address]    Report Status PENDING  Incomplete     Labs: BNP (last 3 results) No results for input(s): BNP in the last 8760 hours. Basic Metabolic Panel: Recent Labs  Lab 05/06/19 1323 05/06/19 1323 05/06/19 1816  05/07/19 0451 05/08/19 0555 05/08/19 0921 05/09/19 0819  NA 136  --  134* 136 137  --  138  K 4.5  --  4.5 3.9 3.9  --  3.8  CL 100  --  103 103 104  --  106  CO2 24  --  21* 25 25  --  25  GLUCOSE 162*  --  177* 166* 158*  --  186*  BUN 34*  --  31* 29* 29*  --  33*  CREATININE 1.50*  --  1.38* 1.42* 1.38*  --  1.48*  CALCIUM 11.3*   < > 10.6* 10.3 9.7 9.7 9.5  MG  --   --  1.2*  --  2.0  --   --   PHOS  --   --  2.6  --   --   --   --    < > = values in this interval not displayed.   Liver Function Tests: Recent Labs  Lab 05/06/19 1323 05/06/19 1816 05/07/19 0451  AST 12* 14* 11*  ALT 9 11 10   ALKPHOS 54 47 49  BILITOT 0.4 0.8 0.6  PROT 6.2* 5.7* 6.3*  ALBUMIN 2.8* 2.6* 2.6*   No results for input(s): LIPASE, AMYLASE in the last 168 hours. Recent Labs  Lab 05/06/19 1816  AMMONIA 23   CBC: Recent Labs  Lab 05/06/19 1323 05/06/19 1903 05/07/19 0451  WBC 8.6 6.9 6.5  NEUTROABS 5.8 4.7  --   HGB 8.3* 7.7* 8.1*  HCT 29.3* 25.7* 27.9*  MCV 74.7* 73.2* 74.8*  PLT 383 313 339   Cardiac Enzymes: No results for input(s): CKTOTAL, CKMB, CKMBINDEX, TROPONINI in the last 168 hours. BNP: Invalid input(s): POCBNP CBG: Recent Labs  Lab 05/08/19 0809 05/08/19 1203 05/08/19 1643 05/08/19 2107 05/09/19 0819  GLUCAP 166* 237* 184* 239* 170*   D-Dimer No results for input(s): DDIMER in the last 72 hours. Hgb A1c Recent Labs    05/07/19 0451  HGBA1C 7.3*   Lipid Profile No results for input(s): CHOL, HDL, LDLCALC, TRIG, CHOLHDL, LDLDIRECT in the last 72 hours. Thyroid function studies Recent Labs    05/06/19 1816  TSH 1.056   Anemia work up No results for input(s): VITAMINB12, FOLATE, FERRITIN, TIBC, IRON, RETICCTPCT in the last 72 hours. Urinalysis    Component Value Date/Time   COLORURINE YELLOW (A) 05/06/2019 1816   APPEARANCEUR CLEAR (A) 05/06/2019 1816   LABSPEC 1.012 05/06/2019 1816   PHURINE 5.0 05/06/2019 1816   GLUCOSEU NEGATIVE 05/06/2019  1816   HGBUR NEGATIVE 05/06/2019 1816   BILIRUBINUR NEGATIVE 05/06/2019 1816   KETONESUR NEGATIVE 05/06/2019 1816   PROTEINUR 30 (A) 05/06/2019 1816   UROBILINOGEN 1.0 01/13/2009 0329   NITRITE NEGATIVE 05/06/2019 1816   LEUKOCYTESUR NEGATIVE 05/06/2019 1816   Sepsis Labs Invalid input(s): PROCALCITONIN,  WBC,  LACTICIDVEN Microbiology Recent Results (from the past 240 hour(s))  SARS CORONAVIRUS 2 (TAT 6-24 HRS) Nasopharyngeal Nasopharyngeal Swab     Status: None   Collection Time: 05/06/19 11:49 PM   Specimen: Nasopharyngeal Swab  Result Value Ref Range Status   SARS Coronavirus 2 NEGATIVE NEGATIVE Final    Comment: (NOTE) SARS-CoV-2 target nucleic acids are NOT DETECTED. The SARS-CoV-2 RNA is generally detectable in upper and lower respiratory specimens during the acute phase of infection. Negative results do not preclude SARS-CoV-2 infection, do not rule out co-infections with other pathogens, and should not be used as the sole basis for treatment or other patient management decisions. Negative results must be combined with clinical observations, patient history, and epidemiological information. The expected result is Negative. Fact Sheet for Patients: SugarRoll.be Fact Sheet for Healthcare Providers: https://www.woods-mathews.com/ This test is not yet approved or cleared by the [place] FDA and  has been authorized for detection and/or diagnosis of SARS-CoV-2 by FDA under an Emergency Use Authorization (EUA). This EUA will remain  in effect (meaning this test can be used) for the duration of the COVID-19 declaration under Section 56 4(b)(1) of the Act, 21 U.S.C. section 360bbb-3(b)(1), unless the authorization is terminated or revoked sooner. Performed at [facility], [place] [address], [place], [place] [address]   Respiratory Panel by PCR  Status: None   Collection Time: 05/06/19 11:49 PM   Specimen: Nasopharyngeal  Swab; Respiratory  Result Value Ref Range Status   Adenovirus NOT DETECTED NOT DETECTED Final   Coronavirus 229E NOT DETECTED NOT DETECTED Final    Comment: (NOTE) The Coronavirus on the Respiratory Panel, DOES NOT test for the novel  Coronavirus (2019 nCoV)    Coronavirus HKU1 NOT DETECTED NOT DETECTED Final   Coronavirus NL63 NOT DETECTED NOT DETECTED Final   Coronavirus OC43 NOT DETECTED NOT DETECTED Final   Metapneumovirus NOT DETECTED NOT DETECTED Final   Rhinovirus / Enterovirus NOT DETECTED NOT DETECTED Final   Influenza A NOT DETECTED NOT DETECTED Final   Influenza B NOT DETECTED NOT DETECTED Final   Parainfluenza Virus 1 NOT DETECTED NOT DETECTED Final   Parainfluenza Virus 2 NOT DETECTED NOT DETECTED Final   Parainfluenza Virus 3 NOT DETECTED NOT DETECTED Final   Parainfluenza Virus 4 NOT DETECTED NOT DETECTED Final   Respiratory Syncytial Virus NOT DETECTED NOT DETECTED Final   Bordetella pertussis NOT DETECTED NOT DETECTED Final   Chlamydophila pneumoniae NOT DETECTED NOT DETECTED Final   Mycoplasma pneumoniae NOT DETECTED NOT DETECTED Final    Comment: Performed at [facility], [place]. [address], [place], [place] [address]  CULTURE, BLOOD (ROUTINE X 2) w Reflex to ID Panel     Status: None (Preliminary result)   Collection Time: 05/07/19  4:51 AM   Specimen: BLOOD  Result Value Ref Range Status   Specimen Description BLOOD LEFT ANTECUBITAL  Final   Special Requests   Final    BOTTLES DRAWN AEROBIC AND ANAEROBIC Blood Culture adequate volume   Culture   Final    NO GROWTH 2 DAYS Performed at [facility], [address] [place], [place] [address]    Report Status PENDING  Incomplete  CULTURE, BLOOD (ROUTINE X 2) w Reflex to ID Panel     Status: None (Preliminary result)   Collection Time: 05/07/19  5:00 AM   Specimen: BLOOD  Result Value Ref Range Status   Specimen Description BLOOD BLOOD LEFT HAND  Final   Special Requests   Final    BOTTLES DRAWN  AEROBIC AND ANAEROBIC Blood Culture adequate volume   Culture   Final    NO GROWTH 2 DAYS Performed at [facility], [address] [place], [place] [address]    Report Status PENDING  Incomplete     Time coordinating discharge: Over 30 minutes  SIGNED:   Nolberto Hanlon, MD  [facility] 05/09/2019, 11:05 AM Pager   If 7PM-7AM, please contact night-coverage [email or web address] Password [email or web address]

## 2019-05-09 NOTE — TOC Transition Note (Signed)
Transition of Care [facility]) - CM/SW Discharge Note   Patient Details  Name: [name] MRN: [id number] Date of Birth: 1950/08/02  Transition of Care [facility] CM/SW Contact:  Su Hilt, RN Phone Number: 05/09/2019, 11:53 AM   Clinical Narrative:    The patient will DC to the home today with his wife, He is set up with [facility] for PT, He will get a WC and a 3 in 1 delivered to the home, he has no additional needs   Final next level of care: [place] Barriers to Discharge: Barriers Resolved   Patient Goals and CMS Choice Patient states their goals for this hospitalization and ongoing recovery are:: go home      Discharge Placement                       Discharge Plan and Services   Discharge Planning Services: CM Consult            DME Arranged: N/A         HH Arranged: PT [place] Agency: [place] ([place]) Date HH Agency Contacted: 05/08/19 Time [place]: [place] Representative spoke with at [place]: [place] ([place]) Interventions     Readmission Risk Interventions No flowsheet data found.

## 2019-05-09 NOTE — Progress Notes (Signed)
SLP Cancellation Note  Patient Details Name: Zamarius Cavins MRN: [id number] DOB: 04-Dec-1950   Cancelled treatment:       Reason Eval/Treat Not Completed: Other (comment)(Patient refused to participate in MBSS and became combative.)  Patient refused to cooperate for an MBSS, he was combative with staff.  Recommend that the patient remain on his current diet, Dysphagia 1 with nectar-thick liquids.   Leroy Sea, MS/CCC- SLP  Lou Miner 05/09/2019, 8:55 AM

## 2019-05-09 NOTE — Progress Notes (Addendum)
Pharmacy Antibiotic Note  [name] is a 69 y.o. male admitted on 05/06/2019 with fever of unknown origin.  Pharmacy has been consulted for Cefepime and Vancomycin dosing.  Pt received Vancomycin 1000mg  in ED. 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  Goal AUC 400-550. Expected AUC: 461 SCr used: 1.48, expected trough ~ 10.9   Height: 6\' 1"  (185.4 cm) Weight: 171 lb (77.6 kg) IBW/kg (Calculated) : 79.9  Temp (24hrs), Avg:97.8 F (36.6 C), Min:97.7 F (36.5 C), Max:97.9 F (36.6 C)  Recent Labs  Lab 05/06/19 1323 05/06/19 1816 05/06/19 1903 05/07/19 0451 05/08/19 0555 05/09/19 0819  WBC 8.6  --  6.9 6.5  --   --   CREATININE 1.50* 1.38*  --  1.42* 1.38* 1.48*    Estimated Creatinine Clearance: 53.2 mL/min (A) (by C-G formula based on SCr of 1.48 mg/dL (H)).    Allergies  Allergen Reactions  . Penicillins Anaphylaxis and Other (See Comments)    Has patient had a PCN reaction causing immediate rash, facial/tongue/throat swelling, SOB or lightheadedness with hypotension: Unknown Has patient had a PCN reaction causing severe rash involving mucus membranes or skin necrosis: Unknown Has patient had a PCN reaction that required hospitalization: Unknown Has patient had a PCN reaction occurring within the last 10 years: No If all of the above answers are "NO", then may proceed with Cephalosporin use.     Antimicrobials this admission: Vancomycin 3/3 >>  Cefepime 3/3 >>   Dose adjustments this admission: 3/5 Changed from Vancomycin 750mg  q12 to 1222m,g q 24  Microbiology results:  BCx: NG x 2 days COVID/FLU NEG  Thank you for allowing pharmacy to be a part of this patient's care.  Lu Duffel, PharmD, BCPS Clinical Pharmacist 05/09/2019 9:24 AM

## 2019-05-09 NOTE — Telephone Encounter (Signed)
On 3/5 Spoke with patient's wife post discharge patient currently resting still intermittently confused.  But overall improved.  Given recent hypercalcemia-likely from malignancy; recommend close follow-up.  Please have the patient follow-up in the same management clinic-either on March 8/March 9; labs-CBC BMP; possible Zometa; Venofer.

## 2019-05-09 NOTE — Progress Notes (Signed)
Speech Language Pathology Treatment: Dysphagia  Patient Details Name: [name] MRN: [id number] DOB: 11-Aug-1950 Today's Date: 05/09/2019 Time: 1205-1250 SLP Time Calculation (min) (ACUTE ONLY): 45 min  Assessment / Plan / Recommendation Clinical Impression  Pt seen for ongoing assessment of swallowing; toleration of dysphagia diet. Pt was scheduled to go to a MBSS this AM for objective assessment of swallowing d/t suspected concern for dysphagia, aspiration of thin liquids. However, pt became agitated and combative, not safe to go off the floor for the study. At this time, pt is scheduled to Discharge home w/ [name] who is in the room.  SLP met w/ [name] and pt to discuss pt's progress and suspected dysphagia w/ risk for aspiration. During this session, pt was assisted in sitting up fully in order to drink the Nectar liquids VIA CUP. No overt s/s of aspiration noted; clear vocal quality when talking b/t trials. Pt exhibits confusion and decreased insight so the bulk of the education was completed w/ the [name]. SLP discussed and gave education on dysphagia and risk for aspiration and Pulmonary decline; swallowing impacted by Cognitive decline; recommended dysphagia diet w/ Nectar consistency liquids; prepration of foods/liquids - options of this diet consistency at home/in the market; ordering information. Answered [name]'s questions re: thickener, thickening liquids in pt's diet including soups. Handouts given on what was discussed. Many thickened liquids and a can of thickening agent was given to take home. [name] services have been ordered to follow pt at Discharge home for ongoing dysphagia tx. Recommend a f/u MBSS in the future for an objective assessment of swallowing, when appropriate, b/f attempting diet upgrade [name] agreed. [name]/[name] updated.     HPI HPI: Pt is a 69 y.o. 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  [name] reports that about 6 weeks ago he began iron infusion due to worsening anemia and that after every infusion he would have incontinence for a few days which will eventually resolve.  However his last infusion 2 weeks ago she noticed that he continued to have persistent incontinence.  He also appeared more fatigue and was sleeping more than usual.  He was very weak and almost had several falls.  Patient also told his [name] at one point that he felt "confused."  He seems to be having trouble with speaking d/t the confusion.  He was recently diagnosed with hearing impairment and has not yet received his hearing aids.  [name] reported pt has had Weight loss.  Pt was admitted w/ dx of Altered mentation status in the setting of small cell B-cell lymphoma w/ worsening anemia and suspected infection.  Pt was initially agitated and combative requiring Medication for the agitation.  Noted Imaging results including Chest CT revealing "Progressive lymphoma within the neck, chest, abdomen".       SLP Plan  Continue with current plan of care(while admitted; pt to d/c today per [name])       Recommendations  Diet recommendations: Dysphagia 1 (puree);Nectar-thick liquid Liquids provided via: Cup;No straw Medication Administration: Crushed with puree(for safer swallowing) Supervision: Patient able to self feed;Staff to assist with self feeding;Intermittent supervision to cue for compensatory strategies Compensations: Minimize environmental distractions;Slow rate;Small sips/bites;Lingual sweep for clearance of pocketing;Multiple dry swallows after each bite/sip;Follow  solids with liquid Postural Changes and/or Swallow Maneuvers: Seated upright 90 degrees;Upright 30-60 min after meal                 General recommendations: (Dietician f/u; Palliative Care f/u for [name]) Oral Care Recommendations: Oral care BID;Oral care before and after PO;Staff/trained caregiver to provide oral care Follow up Recommendations: Home health SLP(pt to d/c home w/ [name]) SLP Visit Diagnosis: Dysphagia, oropharyngeal phase (R13.12)(Cognitive decline) Plan: Continue with current plan of care(while admitted; pt to d/c today per [name])       [name], [place], CCC-SLP Aiyden Lauderback 05/09/2019, 2:24 PM

## 2019-05-09 NOTE — Progress Notes (Addendum)
Patient suffers from weakness, wheelchair bound at home, below knee amputation which impairs their ability to perform daily activities like ADLs in the home.  A walking aid will not resolve issue with performing activities of daily living. A wheelchair will allow patient to safely perform daily activities. Patient is not able to propel themselves in the home using a standard weight wheelchair due to weakness. Patient can self propel in the lightweight wheelchair. Length of need 99 months. Accessories: elevating leg rests ELRs, wheel locks, extensions and anti-tippers, back cushion.

## 2019-05-12 LAB — CULTURE, BLOOD (ROUTINE X 2)
Culture: NO GROWTH
Culture: NO GROWTH
Special Requests: ADEQUATE
Special Requests: ADEQUATE

## 2019-05-12 NOTE — Telephone Encounter (Signed)
Scheduled for 3-9

## 2019-05-13 ENCOUNTER — Inpatient Hospital Stay: Payer: Medicare Other

## 2019-05-13 ENCOUNTER — Other Ambulatory Visit: Payer: Self-pay

## 2019-05-13 ENCOUNTER — Inpatient Hospital Stay (HOSPITAL_BASED_OUTPATIENT_CLINIC_OR_DEPARTMENT_OTHER): Payer: Medicare Other | Admitting: Nurse Practitioner

## 2019-05-13 DIAGNOSIS — C8308 Small cell B-cell lymphoma, lymph nodes of multiple sites: Secondary | ICD-10-CM | POA: Diagnosis not present

## 2019-05-13 DIAGNOSIS — D500 Iron deficiency anemia secondary to blood loss (chronic): Secondary | ICD-10-CM

## 2019-05-13 DIAGNOSIS — I2699 Other pulmonary embolism without acute cor pulmonale: Secondary | ICD-10-CM | POA: Diagnosis not present

## 2019-05-13 DIAGNOSIS — E860 Dehydration: Secondary | ICD-10-CM

## 2019-05-13 DIAGNOSIS — D509 Iron deficiency anemia, unspecified: Secondary | ICD-10-CM | POA: Diagnosis not present

## 2019-05-13 DIAGNOSIS — Z87891 Personal history of nicotine dependence: Secondary | ICD-10-CM | POA: Diagnosis not present

## 2019-05-13 DIAGNOSIS — N183 Chronic kidney disease, stage 3 unspecified: Secondary | ICD-10-CM | POA: Diagnosis not present

## 2019-05-13 DIAGNOSIS — R509 Fever, unspecified: Secondary | ICD-10-CM | POA: Diagnosis not present

## 2019-05-13 DIAGNOSIS — I129 Hypertensive chronic kidney disease with stage 1 through stage 4 chronic kidney disease, or unspecified chronic kidney disease: Secondary | ICD-10-CM | POA: Diagnosis not present

## 2019-05-13 DIAGNOSIS — E1122 Type 2 diabetes mellitus with diabetic chronic kidney disease: Secondary | ICD-10-CM | POA: Diagnosis not present

## 2019-05-13 LAB — CBC WITH DIFFERENTIAL/PLATELET
Abs Immature Granulocytes: 0.1 K/uL — ABNORMAL HIGH (ref 0.00–0.07)
Basophils Absolute: 0.1 K/uL (ref 0.0–0.1)
Basophils Relative: 1 %
Eosinophils Absolute: 0.1 K/uL (ref 0.0–0.5)
Eosinophils Relative: 1 %
HCT: 29 % — ABNORMAL LOW (ref 39.0–52.0)
Hemoglobin: 8.2 g/dL — ABNORMAL LOW (ref 13.0–17.0)
Immature Granulocytes: 1 %
Lymphocytes Relative: 12 %
Lymphs Abs: 1.3 K/uL (ref 0.7–4.0)
MCH: 21.1 pg — ABNORMAL LOW (ref 26.0–34.0)
MCHC: 28.3 g/dL — ABNORMAL LOW (ref 30.0–36.0)
MCV: 74.7 fL — ABNORMAL LOW (ref 80.0–100.0)
Monocytes Absolute: 1 K/uL (ref 0.1–1.0)
Monocytes Relative: 9 %
Neutro Abs: 8 K/uL — ABNORMAL HIGH (ref 1.7–7.7)
Neutrophils Relative %: 76 %
Platelets: 525 K/uL — ABNORMAL HIGH (ref 150–400)
RBC: 3.88 MIL/uL — ABNORMAL LOW (ref 4.22–5.81)
RDW: 17 % — ABNORMAL HIGH (ref 11.5–15.5)
WBC: 10.5 K/uL (ref 4.0–10.5)
nRBC: 0 % (ref 0.0–0.2)

## 2019-05-13 LAB — BASIC METABOLIC PANEL WITH GFR
Anion gap: 10 (ref 5–15)
BUN: 33 mg/dL — ABNORMAL HIGH (ref 8–23)
CO2: 23 mmol/L (ref 22–32)
Calcium: 10.3 mg/dL (ref 8.9–10.3)
Chloride: 103 mmol/L (ref 98–111)
Creatinine, Ser: 1.59 mg/dL — ABNORMAL HIGH (ref 0.61–1.24)
GFR calc Af Amer: 51 mL/min — ABNORMAL LOW
GFR calc non Af Amer: 44 mL/min — ABNORMAL LOW
Glucose, Bld: 131 mg/dL — ABNORMAL HIGH (ref 70–99)
Potassium: 4.4 mmol/L (ref 3.5–5.1)
Sodium: 136 mmol/L (ref 135–145)

## 2019-05-13 MED ORDER — IRON SUCROSE 20 MG/ML IV SOLN
200.0000 mg | Freq: Once | INTRAVENOUS | Status: AC
  Administered 2019-05-13: 200 mg via INTRAVENOUS

## 2019-05-13 MED ORDER — ZOLEDRONIC ACID 4 MG/100ML IV SOLN
4.0000 mg | Freq: Once | INTRAVENOUS | Status: AC
  Administered 2019-05-13: 12:00:00 4 mg via INTRAVENOUS
  Filled 2019-05-13: qty 100

## 2019-05-13 MED ORDER — SODIUM CHLORIDE 0.9 % IV SOLN
Freq: Once | INTRAVENOUS | Status: AC
  Filled 2019-05-13: qty 250

## 2019-05-13 NOTE — Progress Notes (Signed)
[facility]  [phone] [phone]  Patient Care Team: [name], MD as PCP - General (Internal Medicine)   Name of the patient: [name]  [id number]  1950-06-21   Date of visit: 05/13/19  Diagnosis-small cell lymphoma  Chief complaint/ Reason for visit- Hypercalcemia, altered mental status  Heme/Onc history:  Oncology History Overview Note  # October 2019- SMALL CELL LYMPHOMA; ki-67-10%. STAGE- III/ IV; PET scan- bulky LN [Dr.Vaught] above/below Diaphragm; No spleen/liver/bone;   # 22nd October 2019- Bil PE on lovenox xstopped; NOV 2019- Eliquis 5 mg BID. AUG 2020-slight incresae in RP LN ~1cm; continue Ibrutinib;  # worsening Anemia- Jan 2021- Hb 8.5; STOP eliquis; FOBT- positive; referral to GI/pt declines  # DM/HTN/ CKD- stage III -----------------------------------------------   MOLECULAR TESTING: Trisomy 12. 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  He was admitted to the hospital 05/06/2019-05/09/2019 for altered mental status and fever.  He had presented to cancer center for Zometa infusion and was noted to be febrile at 101.9 was sent to ER.  Associated tachycardia.  Blood cultures were negative.  CT revealed progressive lymphoma.  He received IV antibiotics for left lower lobe airspace disease suspicious for infection or aspiration.  He had a swallow eval and was placed on dysphagia 1 diet with nectar liquids.   He had been combative during hospitalization.  Will be receiving speech therapy outpatient.  Received IV iron during admission.  Found to have hypercalcemia.  Some concern for transformation to high-grade tumor given hypercalcemia is unlikely from small cell lymphoma.  He has mild chronic kidney disease stage IIIa.  He is awaiting PET scan scheduled for 05/23/2019. Today he is accompanied by his wife who provides majority of history. She says that he is much less confused and seems to be doing better since discharge. He is driving again and more independent of ADLs. Eating and drinking has not yet returned to baseline. He does not like dysphagia diet. No more fevers. Per wife, mentation is at baseline.  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  HENT: Negative for hearing loss, nosebleeds, sore throat and tinnitus.   Eyes: Negative for blurred vision and double vision.  Respiratory: Negative for cough, hemoptysis, shortness of breath and wheezing.   Cardiovascular: Negative for chest pain, palpitations and leg swelling.  Gastrointestinal: Negative for abdominal pain, blood in stool, constipation, diarrhea, melena, nausea and vomiting.  Genitourinary: Negative for dysuria and urgency.  Musculoskeletal: Negative for back pain, falls, joint pain and myalgias.  Skin: Negative for itching and rash.  Neurological: Negative for dizziness, tingling, sensory change, loss of consciousness, weakness and headaches.  Endo/Heme/Allergies: Negative for environmental allergies. Does not bruise/bleed easily.  Psychiatric/Behavioral: Negative for depression. The patient is not nervous/anxious and does not have insomnia.     Current treatment-Imbruvica  Allergies  Allergen Reactions  .  Penicillins Anaphylaxis and Other (See Comments)    Has patient  had a PCN reaction causing immediate rash, facial/tongue/throat swelling, SOB or lightheadedness with hypotension: Unknown Has patient had a PCN reaction causing severe rash involving mucus membranes or skin necrosis: Unknown Has patient had a PCN reaction that required hospitalization: Unknown Has patient had a PCN reaction occurring within the last 10 years: No If all of the above answers are "NO", then may proceed with Cephalosporin use.     Past Medical History:  Diagnosis Date  . Anxiety   . Arthritis   . 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 HOH (hard of hearing)   . Hypertension   . Osteomyelitis of toe ([place]) 06/08/11   right foot  . Seasonal allergies   . Type II diabetes mellitus ([place])    diet controlled    Past Surgical History:  Procedure Laterality Date  . AMPUTATION  06/08/2011   Procedure: AMPUTATION RAY;  Surgeon: Wylene Simmer, MD;  Location: [place];  Service: Orthopedics;  Laterality: Right;  [place] Amputation  . [place]   "crushed"  . FRACTURE SURGERY    . 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 PILONIDAL CYST / SINUS EXCISION  1970's  . TOE AMPUTATION  06/08/11   partial; right great toe    Social History   Socioeconomic History  . Marital status: Married    Spouse name: Not on file  . Number of children: Not on file  . Years of education: Not on file  . Highest education level: Not on file  Occupational History  . Not on file  Tobacco Use  . Smoking status: Former Smoker    Packs/day: 1.50    Years: 22.00    Pack years: 33.00    Types: Cigarettes    Quit date: 05/24/2011    Years since quitting: 7.9  . Smokeless tobacco: Former Systems developer    Types: [place] date: 03/07/1983  Substance and Sexual Activity  . Alcohol use: Yes    Comment: 06/08/11 "no liquor for 2 1/2 years; last beer 05/31/11"  . Drug use: No  . Sexual activity: Yes  Other Topics Concern  . Not on file  Social History Narrative   Married   Social  Determinants of Health   Financial Resource Strain:   . Difficulty of Paying Living Expenses: Not on file  Food Insecurity:   . Worried About Charity fundraiser in the Last Year: Not on file  . Ran Out of Food in the Last Year: Not on file  Transportation Needs:   . Lack of Transportation (Medical): Not on file  . Lack of Transportation (Non-Medical): Not on file  Physical Activity:   . Days of Exercise per Week: Not on file  . Minutes of Exercise per Session: Not on file  Stress:   . Feeling of Stress : Not on file  Social Connections:   . Frequency of Communication with Friends and Family: Not on file  . Frequency of Social Gatherings with Friends and Family: Not on file  . Attends Religious Services: Not on file  . Active Member of Clubs or Organizations: Not on file  . Attends Archivist Meetings: Not on file  . Marital Status: Not on file  Intimate Partner Violence:   . Fear of Current or Ex-Partner: Not on file  . Emotionally Abused: Not on file  .  Physically Abused: Not on file  . Sexually Abused: Not on file    Family History  Problem Relation Age of Onset  . Prostate cancer Father   . Hypertension Father   . Other Mother        VARICOSE VEINS     Current Outpatient Medications:  .  amLODipine (NORVASC) 5 MG tablet, Take 1 tablet (5 mg total) by mouth daily., Disp: 90 tablet, Rfl: 1 .  ECHINACEA PO, Take 760 mg by mouth daily., Disp: , Rfl:  .  fluticasone (FLONASE) 50 MCG/ACT nasal spray, Place 2 sprays into both nostrils daily as needed for allergies or rhinitis., Disp: , Rfl:  .  Iron-Vitamin C 65-125 MG TABS, Take 1 tablet by mouth. VITRON C, Disp: , Rfl:  .  JANUVIA 100 MG tablet, Take 100 mg by mouth at bedtime. , Disp: , Rfl: 3 .  metFORMIN (GLUCOPHAGE) 500 MG tablet, Take 1,000 mg by mouth 2 (two) times daily., Disp: , Rfl:  .  pravastatin (PRAVACHOL) 40 MG tablet, Take 40 mg by mouth at bedtime. , Disp: , Rfl:  .  Ginseng 100 MG CAPS, Take 100  mg by mouth daily., Disp: , Rfl:  .  IMBRUVICA 420 MG TABS, TAKE 1 TABLET (420 MG) BY MOUTH DAILY. (Patient not taking: Reported on 05/13/2019), Disp: 28 tablet, Rfl: 3 .  mupirocin ointment (BACTROBAN) 2 %, APPLY WITH Q TIP THREE TIMES DAILY AS NEEDED FOR DRYNESS AND CRUSTING, Disp: , Rfl: 3 .  Omega-3 Fatty Acids (OMEGA 3 500) 500 MG CAPS, Take 500 mg by mouth daily., Disp: , Rfl:  .  ondansetron (ZOFRAN) 4 MG tablet, TAKE 1 TABLET BY MOUTH EVERY 8 HOURS AS NEEDED FOR NAUSEA FOR VOMITING (Patient not taking: Reported on 05/06/2019), Disp: 20 tablet, Rfl: 2 .  ONE TOUCH ULTRA TEST test strip, USE 1 STRIP TO CHECK GLUCOSE ONCE A WEEK, Disp: , Rfl: 99  Physical exam:  Vitals:   05/13/19 1042  BP: (!) 162/76  Pulse: 91  Resp: 18  Temp: (!) 97 F (36.1 C)  TempSrc: Tympanic  SpO2: 100%  Weight: 168 lb (76.2 kg)   Physical Exam Constitutional:      General: He is not in acute distress. HENT:     Head: Normocephalic.  Eyes:     General: No scleral icterus.    Conjunctiva/sclera: Conjunctivae normal.  Cardiovascular:     Rate and Rhythm: Normal rate and regular rhythm.  Pulmonary:     Effort: Pulmonary effort is normal. No respiratory distress.  Abdominal:     General: There is no distension.     Tenderness: There is no abdominal tenderness.  Musculoskeletal:        General: Deformity (BKA) present.     Comments: Wheelchair for ambulation  Skin:    General: Skin is warm and dry.  Neurological:     Mental Status: He is alert. Mental status is at baseline. He is disoriented.     Motor: No weakness.     Comments: Patient has altered speech and trains of thought intermittently.  Per wife, this is patient's baseline.  Nursing staff also agrees that patient baseline.  Psychiatric:        Speech: Speech is tangential.        Behavior: Behavior is cooperative.        Thought Content: Thought content normal.        Cognition and Memory: Cognition is impaired.  Judgment: Judgment is  impulsive.      CMP Latest Ref Rng & Units 05/13/2019  Glucose 70 - 99 mg/dL 131(H)  BUN 8 - 23 mg/dL 33(H)  Creatinine 0.61 - 1.24 mg/dL 1.59(H)  Sodium 135 - 145 mmol/L 136  Potassium 3.5 - 5.1 mmol/L 4.4  Chloride 98 - 111 mmol/L 103  CO2 22 - 32 mmol/L 23  Calcium 8.9 - 10.3 mg/dL 10.3  Total Protein 6.5 - 8.1 g/dL -  Total Bilirubin 0.3 - 1.2 mg/dL -  Alkaline Phos 38 - 126 U/L -  AST 15 - 41 U/L -  ALT 0 - 44 U/L -   CBC Latest Ref Rng & Units 05/13/2019  WBC 4.0 - 10.5 K/uL 10.5  Hemoglobin 13.0 - 17.0 g/dL 8.2(L)  Hematocrit 39.0 - 52.0 % 29.0(L)  Platelets 150 - 400 K/uL 525(H)    No images are attached to the encounter.  CT ABDOMEN PELVIS WO CONTRAST  Result Date: 05/07/2019 CLINICAL DATA:  Hyperparathyroidism. Hypercalcemia. Small B-cell lymphoma, on immune therapy. Pulmonary emboli in September. Altered mental status and fever. EXAM: CT CHEST, ABDOMEN AND PELVIS WITHOUT CONTRAST TECHNIQUE: Multidetector CT imaging of the chest, abdomen and pelvis was performed following the standard protocol without IV contrast. COMPARISON:  01/24/2019 FINDINGS: CT CHEST FINDINGS Cardiovascular: Multifactorial degradation, including motion in the lower chest, patient arm position (not raised above the head), and lack of IV contrast. Aortic and branch vessel atherosclerosis. Tortuous thoracic aorta. Mild cardiomegaly. Multivessel coronary artery atherosclerosis. Mediastinum/Nodes: Left supraclavicular index node measures 2.3 cm on 07/02, increased from 1.3 cm on the prior. Low left jugular nodes measure up to 1.8 cm today versus 1.4 cm on the prior. Relatively similar multiple bilateral axillary nodes. Left paratracheal node measures 1.2 cm on 22/2, newly enlarged since the prior. Hilar regions poorly evaluated without intravenous contrast. Lungs/Pleura: No pleural fluid. Right greater than left subpleural pulmonary nodules are similar, likely subpleural lymph nodes. Example at up to 6 mm on 72/4  in the right upper lobe. New superior segment left lower lobe clustered nodular airspace and ground-glass opacity, including on 74/4. Musculoskeletal: Right upper extremity edema and subcutaneous gas are incompletely imaged, including on 38/2. Question intramuscular hematoma, including on 44/2. No acute osseous abnormality. CT ABDOMEN PELVIS FINDINGS Hepatobiliary: Multifactorial degradation continuing into the upper abdomen. Grossly normal noncontrast appearance of the liver, gallbladder. Pancreas: Grossly normal pancreas. Spleen: Normal in size, without focal abnormality. Adrenals/Urinary Tract: Normal adrenal glands. Favor renal vascular calcifications over renal calculi. No hydronephrosis. Bladder wall thickening is mild, at least partially felt to be due to underdistention. Stomach/Bowel: Grossly normal stomach. Colonic stool burden suggests constipation. Normal small bowel caliber. Vascular/Lymphatic: Aortic atherosclerosis. Bulky abdominal adenopathy. Index left periaortic node measures 2.7 x 3.6 cm on 77/2 versus 2.2 x 2.8 cm at the same level on the prior. A dominant left periaortic nodal mass measures maximally 6.7 cm on 93/2 today versus 5.5 cm on the prior exam (when remeasured). Index left external iliac node measures 3.3 cm on 116/2 and is similar to on the prior exam (when remeasured). Reproductive: Moderate prostatomegaly. Other: No significant free fluid. Musculoskeletal: No acute osseous abnormality. Lumbosacral junction degenerative disc disease. IMPRESSION: 1. Multifactorial degradation, including lack of IV contrast, motion, patient arm position. 2. Since 01/24/2019, development of left lower lobe nodular airspace disease, suspicious for infection or aspiration. 3. Right upper extremity edema and gas with possible intramuscular hematoma. Correlate with recent trauma or attempted IV placement. If no correlate history,  deep venous thrombosis of the right upper extremity to would be a consideration  and ultrasound suggested. 4. Progressive lymphoma within the neck, chest, abdomen, as detailed above. Pelvic adenopathy is relatively similar. 5. Prostatomegaly. Apparent bladder wall thickening is at least partially felt to be due to underdistention. Correlate for possible bladder outlet obstruction. 6. Coronary artery atherosclerosis. Aortic Atherosclerosis (ICD10-I70.0). 7.  Possible constipation. Electronically Signed   By: Abigail Miyamoto M.D.   On: 05/07/2019 16:57   CT Head Wo Contrast  Result Date: 05/06/2019 CLINICAL DATA:  Altered mental status, unclear cause EXAM: CT HEAD WITHOUT CONTRAST TECHNIQUE: Contiguous axial images were obtained from the base of the skull through the vertex without intravenous contrast. COMPARISON:  None. 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 No CT evident large vascular territory or cortically based infarction is seen. No evidence of acute, hemorrhage, hydrocephalus, extra-axial collection or mass lesion/mass effect. Patchy areas of white matter hypoattenuation are most compatible with chronic microvascular angiopathy. Symmetric prominence of the ventricles, cisterns and sulci compatible with parenchymal volume loss. Vascular: Atherosclerotic calcification of the carotid siphons and intradural vertebral arteries. No hyperdense vessel. Skull: No calvarial fracture or suspicious osseous lesion. No scalp swelling or hematoma. Vascular calcium of the scalp, often seen in diabetes. Sinuses/Orbits: Paranasal sinuses and mastoid air cells are predominantly clear. Included orbital structures are unremarkable. Other: None IMPRESSION: 1. Multiple acquisition required due to patient motion artifact. 2. No convincing CT evidence of acute intracranial abnormality. 3. 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 4. Chronic microangiopathic changes with parenchymal volume loss. Intracranial  atherosclerosis. Electronically Signed   By: Lovena Le M.D.   On: 05/06/2019 21:15   CT CHEST WO CONTRAST  Result Date: 05/07/2019 CLINICAL DATA:  Hyperparathyroidism. Hypercalcemia. Small B-cell lymphoma, on immune therapy. Pulmonary emboli in September. Altered mental status and fever. EXAM: CT CHEST, ABDOMEN AND PELVIS WITHOUT CONTRAST TECHNIQUE: Multidetector CT imaging of the chest, abdomen and pelvis was performed following the standard protocol without IV contrast. COMPARISON:  01/24/2019 FINDINGS: CT CHEST FINDINGS Cardiovascular: Multifactorial degradation, including motion in the lower chest, patient arm position (not raised above the head), and lack of IV contrast. Aortic and branch vessel atherosclerosis. Tortuous thoracic aorta. Mild cardiomegaly. Multivessel coronary artery atherosclerosis. Mediastinum/Nodes: Left supraclavicular index node measures 2.3 cm on 07/02, increased from 1.3 cm on the prior. Low left jugular nodes measure up to 1.8 cm today versus 1.4 cm on the prior. Relatively similar multiple bilateral axillary nodes. Left paratracheal node measures 1.2 cm on 22/2, newly enlarged since the prior. Hilar regions poorly evaluated without intravenous contrast. Lungs/Pleura: No pleural fluid. Right greater than left subpleural pulmonary nodules are similar, likely subpleural lymph nodes. Example at up to 6 mm on 72/4 in the right upper lobe. New superior segment left lower lobe clustered nodular airspace and ground-glass opacity, including on 74/4. Musculoskeletal: Right upper extremity edema and subcutaneous gas are incompletely imaged, including on 38/2. Question intramuscular hematoma, including on 44/2. No acute osseous abnormality. CT ABDOMEN PELVIS FINDINGS Hepatobiliary: Multifactorial degradation continuing into the upper abdomen. Grossly normal noncontrast appearance of the liver, gallbladder. Pancreas: Grossly normal pancreas. Spleen: Normal in size, without focal abnormality.  Adrenals/Urinary Tract: Normal adrenal glands. Favor renal vascular calcifications over renal calculi. No hydronephrosis. Bladder wall thickening is mild, at least partially felt to be due to underdistention. Stomach/Bowel: Grossly normal stomach. Colonic stool burden suggests constipation. Normal small bowel caliber. Vascular/Lymphatic: Aortic atherosclerosis. Bulky  abdominal adenopathy. Index left periaortic node measures 2.7 x 3.6 cm on 77/2 versus 2.2 x 2.8 cm at the same level on the prior. A dominant left periaortic nodal mass measures maximally 6.7 cm on 93/2 today versus 5.5 cm on the prior exam (when remeasured). Index left external iliac node measures 3.3 cm on 116/2 and is similar to on the prior exam (when remeasured). Reproductive: Moderate prostatomegaly. Other: No significant free fluid. Musculoskeletal: No acute osseous abnormality. Lumbosacral junction degenerative disc disease. IMPRESSION: 1. Multifactorial degradation, including lack of IV contrast, motion, patient arm position. 2. Since 01/24/2019, development of left lower lobe nodular airspace disease, suspicious for infection or aspiration. 3. Right upper extremity edema and gas with possible intramuscular hematoma. Correlate with recent trauma or attempted IV placement. If no correlate history, deep venous thrombosis of the right upper extremity to would be a consideration and ultrasound suggested. 4. Progressive lymphoma within the neck, chest, abdomen, as detailed above. Pelvic adenopathy is relatively similar. 5. Prostatomegaly. Apparent bladder wall thickening is at least partially felt to be due to underdistention. Correlate for possible bladder outlet obstruction. 6. Coronary artery atherosclerosis. Aortic Atherosclerosis (ICD10-I70.0). 7.  Possible constipation. Electronically Signed   By: Abigail Miyamoto M.D.   On: 05/07/2019 16:57   DG Chest Portable 1 View  Result Date: 05/06/2019 CLINICAL DATA:  Fevers and altered mental status,  history of lymphoma EXAM: PORTABLE CHEST 1 VIEW COMPARISON:  01/24/2019 CT, plain film from 11/13/2017 FINDINGS: Cardiac shadow is within normal limits. The lungs are well aerated bilaterally. No focal infiltrate or sizable effusion is seen. Aortic calcifications are noted. No acute bony abnormality is noted. Exuberant calcification at the first costochondral margins is noted bilaterally. IMPRESSION: No acute abnormality noted. Electronically Signed   By: Inez Catalina M.D.   On: 05/06/2019 22:18    Assessment and plan- Patient is a 69 y.o. male diagnosed with small cell lymphoma who presents to symptom management clinic for hypercalcemia and aspiration.  1.  Hypercalcemia-calcium 10.3 today.  Upper limit of normal.  Recommend Zometa 4 mg today. Etiology unclear; see below. Possibly malignancy related.   2. Confusion- per wife, patient more at baseline and driving. Discouraged driving and operation of heavy machinery.   3. Small Cell Lymphoma- per ct progressive lymphoma. Awaiting pet given hypercalcemia for concern of transformation to high grade tumor. Last treatment on 05/06/19. PET scheduled for 3/19.   4. Mild dehydration- likely secondary to previous fever and alterations in diet.  Creatinine 1.59, BUN 33.  Will give IV fluids today. Afebrile in clinic.  ANC elevated though no leukocytosis.  Recommended monitoring of temperature at home as etiology unclear but currently s/p broadspectrum antibiotics.   5.  Anemia-hemoglobin 8.2 today.  Improved.  Plan for Venofer today.  Disposition:  Discussed rechecking labs and possible zometa later this week but patient requests follow up in a week. Advised that if he begins to have fever, confusion, to return to clinic sooner.    Discussed with Dr. Rogue Bussing who is in agreement and contributed to plan of care   Visit Diagnosis 1. Hypercalcemia   2. Small B-cell lymphoma of lymph nodes of multiple regions (HCC)   3. Dehydration   4. Iron deficiency  anemia, unspecified iron deficiency anemia type     Patient expressed understanding and was in agreement with this plan. He also understands that He can call clinic at any time with any questions, concerns, or complaints.   Thank you for allowing me to  participate in the care of this very pleasant patient.   Beckey Rutter, DNP, AGNP-[facility]  CC: Dr. Rogue Bussing

## 2019-05-16 ENCOUNTER — Ambulatory Visit: Payer: Medicare Other

## 2019-05-16 ENCOUNTER — Inpatient Hospital Stay: Payer: Medicare Other | Admitting: Internal Medicine

## 2019-05-16 ENCOUNTER — Other Ambulatory Visit: Payer: Medicare Other

## 2019-05-19 ENCOUNTER — Other Ambulatory Visit: Payer: Medicare Other

## 2019-05-19 ENCOUNTER — Ambulatory Visit: Payer: Medicare Other | Admitting: Internal Medicine

## 2019-05-20 ENCOUNTER — Inpatient Hospital Stay: Payer: Medicare Other

## 2019-05-20 ENCOUNTER — Emergency Department: Payer: Medicare Other

## 2019-05-20 ENCOUNTER — Inpatient Hospital Stay: Payer: Medicare Other | Admitting: Internal Medicine

## 2019-05-20 ENCOUNTER — Other Ambulatory Visit: Payer: Self-pay

## 2019-05-20 ENCOUNTER — Telehealth: Payer: Self-pay | Admitting: *Deleted

## 2019-05-20 ENCOUNTER — Inpatient Hospital Stay
Admission: EM | Admit: 2019-05-20 | Discharge: 2019-05-24 | DRG: 871 | Disposition: A | Discharge status: Home Health | Payer: Medicare Other | Attending: Internal Medicine | Admitting: Internal Medicine

## 2019-05-20 DIAGNOSIS — E785 Hyperlipidemia, unspecified: Secondary | ICD-10-CM | POA: Diagnosis present

## 2019-05-20 DIAGNOSIS — F419 Anxiety disorder, unspecified: Secondary | ICD-10-CM | POA: Diagnosis present

## 2019-05-20 DIAGNOSIS — E43 Unspecified severe protein-calorie malnutrition: Secondary | ICD-10-CM | POA: Diagnosis present

## 2019-05-20 DIAGNOSIS — G4700 Insomnia, unspecified: Secondary | ICD-10-CM | POA: Diagnosis present

## 2019-05-20 DIAGNOSIS — E1151 Type 2 diabetes mellitus with diabetic peripheral angiopathy without gangrene: Secondary | ICD-10-CM | POA: Diagnosis present

## 2019-05-20 DIAGNOSIS — C8308 Small cell B-cell lymphoma, lymph nodes of multiple sites: Secondary | ICD-10-CM

## 2019-05-20 DIAGNOSIS — E1129 Type 2 diabetes mellitus with other diabetic kidney complication: Secondary | ICD-10-CM | POA: Diagnosis present

## 2019-05-20 DIAGNOSIS — R338 Other retention of urine: Secondary | ICD-10-CM

## 2019-05-20 DIAGNOSIS — A419 Sepsis, unspecified organism: Secondary | ICD-10-CM | POA: Diagnosis present

## 2019-05-20 DIAGNOSIS — Z9119 Patient's noncompliance with other medical treatment and regimen: Secondary | ICD-10-CM

## 2019-05-20 DIAGNOSIS — Z87891 Personal history of nicotine dependence: Secondary | ICD-10-CM | POA: Diagnosis not present

## 2019-05-20 DIAGNOSIS — R39198 Other difficulties with micturition: Secondary | ICD-10-CM

## 2019-05-20 DIAGNOSIS — Z86711 Personal history of pulmonary embolism: Secondary | ICD-10-CM

## 2019-05-20 DIAGNOSIS — I10 Essential (primary) hypertension: Secondary | ICD-10-CM | POA: Diagnosis not present

## 2019-05-20 DIAGNOSIS — D84821 Immunodeficiency due to drugs: Secondary | ICD-10-CM | POA: Diagnosis present

## 2019-05-20 DIAGNOSIS — Z89512 Acquired absence of left leg below knee: Secondary | ICD-10-CM | POA: Diagnosis not present

## 2019-05-20 DIAGNOSIS — E860 Dehydration: Secondary | ICD-10-CM | POA: Diagnosis present

## 2019-05-20 DIAGNOSIS — R778 Other specified abnormalities of plasma proteins: Secondary | ICD-10-CM

## 2019-05-20 DIAGNOSIS — R509 Fever, unspecified: Secondary | ICD-10-CM

## 2019-05-20 DIAGNOSIS — D500 Iron deficiency anemia secondary to blood loss (chronic): Secondary | ICD-10-CM | POA: Diagnosis present

## 2019-05-20 DIAGNOSIS — E1122 Type 2 diabetes mellitus with diabetic chronic kidney disease: Secondary | ICD-10-CM | POA: Diagnosis present

## 2019-05-20 DIAGNOSIS — T451X5A Adverse effect of antineoplastic and immunosuppressive drugs, initial encounter: Secondary | ICD-10-CM | POA: Diagnosis present

## 2019-05-20 DIAGNOSIS — M7989 Other specified soft tissue disorders: Secondary | ICD-10-CM

## 2019-05-20 DIAGNOSIS — I248 Other forms of acute ischemic heart disease: Secondary | ICD-10-CM | POA: Diagnosis present

## 2019-05-20 DIAGNOSIS — G9341 Metabolic encephalopathy: Secondary | ICD-10-CM | POA: Diagnosis present

## 2019-05-20 DIAGNOSIS — R531 Weakness: Secondary | ICD-10-CM | POA: Diagnosis not present

## 2019-05-20 DIAGNOSIS — Z6822 Body mass index (BMI) 22.0-22.9, adult: Secondary | ICD-10-CM

## 2019-05-20 DIAGNOSIS — H9190 Unspecified hearing loss, unspecified ear: Secondary | ICD-10-CM | POA: Diagnosis present

## 2019-05-20 DIAGNOSIS — Z20822 Contact with and (suspected) exposure to covid-19: Secondary | ICD-10-CM | POA: Diagnosis present

## 2019-05-20 DIAGNOSIS — J181 Lobar pneumonia, unspecified organism: Secondary | ICD-10-CM

## 2019-05-20 DIAGNOSIS — Z7984 Long term (current) use of oral hypoglycemic drugs: Secondary | ICD-10-CM | POA: Diagnosis not present

## 2019-05-20 DIAGNOSIS — R6520 Severe sepsis without septic shock: Secondary | ICD-10-CM | POA: Diagnosis not present

## 2019-05-20 DIAGNOSIS — Z515 Encounter for palliative care: Secondary | ICD-10-CM | POA: Diagnosis not present

## 2019-05-20 DIAGNOSIS — D631 Anemia in chronic kidney disease: Secondary | ICD-10-CM | POA: Diagnosis present

## 2019-05-20 DIAGNOSIS — E1121 Type 2 diabetes mellitus with diabetic nephropathy: Secondary | ICD-10-CM | POA: Diagnosis not present

## 2019-05-20 DIAGNOSIS — N1831 Chronic kidney disease, stage 3a: Secondary | ICD-10-CM | POA: Diagnosis present

## 2019-05-20 DIAGNOSIS — I129 Hypertensive chronic kidney disease with stage 1 through stage 4 chronic kidney disease, or unspecified chronic kidney disease: Secondary | ICD-10-CM | POA: Diagnosis present

## 2019-05-20 DIAGNOSIS — N179 Acute kidney failure, unspecified: Secondary | ICD-10-CM

## 2019-05-20 DIAGNOSIS — J189 Pneumonia, unspecified organism: Secondary | ICD-10-CM | POA: Diagnosis present

## 2019-05-20 DIAGNOSIS — N189 Chronic kidney disease, unspecified: Secondary | ICD-10-CM | POA: Diagnosis not present

## 2019-05-20 DIAGNOSIS — Z79899 Other long term (current) drug therapy: Secondary | ICD-10-CM

## 2019-05-20 DIAGNOSIS — R7989 Other specified abnormal findings of blood chemistry: Secondary | ICD-10-CM | POA: Diagnosis present

## 2019-05-20 DIAGNOSIS — G9340 Encephalopathy, unspecified: Secondary | ICD-10-CM | POA: Diagnosis not present

## 2019-05-20 DIAGNOSIS — T148XXA Other injury of unspecified body region, initial encounter: Secondary | ICD-10-CM

## 2019-05-20 HISTORY — DX: Malignant (primary) neoplasm, unspecified: C80.1

## 2019-05-20 LAB — BRAIN NATRIURETIC PEPTIDE: B Natriuretic Peptide: 90 pg/mL (ref 0.0–100.0)

## 2019-05-20 LAB — COMPREHENSIVE METABOLIC PANEL
ALT: 12 U/L (ref 0–44)
AST: 17 U/L (ref 15–41)
Albumin: 3.1 g/dL — ABNORMAL LOW (ref 3.5–5.0)
Alkaline Phosphatase: 55 U/L (ref 38–126)
Anion gap: 10 (ref 5–15)
BUN: 32 mg/dL — ABNORMAL HIGH (ref 8–23)
CO2: 24 mmol/L (ref 22–32)
Calcium: 9.7 mg/dL (ref 8.9–10.3)
Chloride: 101 mmol/L (ref 98–111)
Creatinine, Ser: 1.84 mg/dL — ABNORMAL HIGH (ref 0.61–1.24)
GFR calc Af Amer: 43 mL/min — ABNORMAL LOW (ref >60)
GFR calc non Af Amer: 37 mL/min — ABNORMAL LOW (ref >60)
Glucose, Bld: 177 mg/dL — ABNORMAL HIGH (ref 70–99)
Potassium: 4.1 mmol/L (ref 3.5–5.1)
Sodium: 135 mmol/L (ref 135–145)
Total Bilirubin: 0.5 mg/dL (ref 0.3–1.2)
Total Protein: 6.7 g/dL (ref 6.5–8.1)

## 2019-05-20 LAB — TROPONIN I (HIGH SENSITIVITY)
Troponin I (High Sensitivity): 21 ng/L — ABNORMAL HIGH (ref <18)
Troponin I (High Sensitivity): 20 ng/L — ABNORMAL HIGH (ref <18)
Troponin I (High Sensitivity): 22 ng/L — ABNORMAL HIGH (ref <18)
Troponin I (High Sensitivity): 23 ng/L — ABNORMAL HIGH (ref <18)
Troponin I (High Sensitivity): 23 ng/L — ABNORMAL HIGH (ref <18)

## 2019-05-20 LAB — CBC WITH DIFFERENTIAL/PLATELET
Abs Immature Granulocytes: 0.05 10*3/uL (ref 0.00–0.07)
Basophils Absolute: 0.1 10*3/uL (ref 0.0–0.1)
Basophils Relative: 1 %
Eosinophils Absolute: 0.2 10*3/uL (ref 0.0–0.5)
Eosinophils Relative: 2 %
HCT: 33.2 % — ABNORMAL LOW (ref 39.0–52.0)
Hemoglobin: 9.7 g/dL — ABNORMAL LOW (ref 13.0–17.0)
Immature Granulocytes: 1 %
Lymphocytes Relative: 16 %
Lymphs Abs: 1.5 10*3/uL (ref 0.7–4.0)
MCH: 21.9 pg — ABNORMAL LOW (ref 26.0–34.0)
MCHC: 29.2 g/dL — ABNORMAL LOW (ref 30.0–36.0)
MCV: 75.1 fL — ABNORMAL LOW (ref 80.0–100.0)
Monocytes Absolute: 0.8 10*3/uL (ref 0.1–1.0)
Monocytes Relative: 9 %
Neutro Abs: 7 10*3/uL (ref 1.7–7.7)
Neutrophils Relative %: 71 %
Platelets: 333 10*3/uL (ref 150–400)
RBC: 4.42 MIL/uL (ref 4.22–5.81)
RDW: 19.4 % — ABNORMAL HIGH (ref 11.5–15.5)
WBC: 9.6 10*3/uL (ref 4.0–10.5)
nRBC: 0 % (ref 0.0–0.2)

## 2019-05-20 LAB — LACTIC ACID, PLASMA
Lactic Acid, Venous: 2.1 mmol/L (ref 0.5–1.9)
Lactic Acid, Venous: 1.5 mmol/L (ref 0.5–1.9)
Lactic Acid, Venous: 1.4 mmol/L (ref 0.5–1.9)
Lactic Acid, Venous: 1 mmol/L (ref 0.5–1.9)

## 2019-05-20 LAB — URINALYSIS, COMPLETE (UACMP) WITH MICROSCOPIC
Bilirubin Urine: NEGATIVE
Glucose, UA: NEGATIVE mg/dL
Hgb urine dipstick: NEGATIVE
Ketones, ur: NEGATIVE mg/dL
Leukocytes,Ua: NEGATIVE
Nitrite: NEGATIVE
Protein, ur: 100 mg/dL — AB
Specific Gravity, Urine: 1.013 (ref 1.005–1.030)
pH: 5 (ref 5.0–8.0)

## 2019-05-20 LAB — GLUCOSE, CAPILLARY: Glucose-Capillary: 134 mg/dL — ABNORMAL HIGH (ref 70–99)

## 2019-05-20 LAB — PROCALCITONIN: Procalcitonin: 0.49 ng/mL

## 2019-05-20 LAB — RESPIRATORY PANEL BY RT PCR (FLU A&B, COVID)
Influenza A by PCR: NEGATIVE
Influenza B by PCR: NEGATIVE
SARS Coronavirus 2 by RT PCR: NEGATIVE

## 2019-05-20 LAB — PTH-RELATED PEPTIDE: PTH-related peptide: <2 pmol/L

## 2019-05-20 MED ORDER — ECHINACEA 500 MG PO CAPS: 760.0000 mg | ORAL_CAPSULE | Freq: Every day | ORAL | Status: DC

## 2019-05-20 MED ORDER — SODIUM CHLORIDE 0.9 % IV SOLN
2.0000 g | Freq: Two times a day (BID) | INTRAVENOUS | Status: DC
  Administered 2019-05-20 – 2019-05-24 (×7): 2 g via INTRAVENOUS
  Filled 2019-05-20 (×11): qty 2

## 2019-05-20 MED ORDER — VITAMIN C 250 MG PO TABS
125.0000 mg | ORAL_TABLET | Freq: Every day | ORAL | Status: DC
  Filled 2019-05-20: qty 1

## 2019-05-20 MED ORDER — SODIUM CHLORIDE 0.9 % IV SOLN: Freq: Once | INTRAVENOUS | Status: AC

## 2019-05-20 MED ORDER — INSULIN ASPART 100 UNIT/ML ~~LOC~~ SOLN
0.0000 [IU] | Freq: Three times a day (TID) | SUBCUTANEOUS | Status: DC
  Administered 2019-05-21: 2 [IU] via SUBCUTANEOUS
  Administered 2019-05-21: 1 [IU] via SUBCUTANEOUS
  Administered 2019-05-22: 3 [IU] via SUBCUTANEOUS
  Administered 2019-05-22: 2 [IU] via SUBCUTANEOUS
  Administered 2019-05-22: 7 [IU] via SUBCUTANEOUS
  Administered 2019-05-23 – 2019-05-24 (×2): 3 [IU] via SUBCUTANEOUS
  Administered 2019-05-24: 1 [IU] via SUBCUTANEOUS
  Filled 2019-05-20 (×8): qty 1

## 2019-05-20 MED ORDER — SODIUM CHLORIDE 0.9 % IV SOLN: 2.0000 g | Freq: Two times a day (BID) | INTRAVENOUS | Status: DC

## 2019-05-20 MED ORDER — FERROUS SULFATE 325 (65 FE) MG PO TABS
325.0000 mg | ORAL_TABLET | Freq: Every day | ORAL | Status: DC
  Administered 2019-05-20 – 2019-05-24 (×4): 325 mg via ORAL
  Filled 2019-05-20 (×4): qty 1

## 2019-05-20 MED ORDER — HYDRALAZINE HCL 25 MG PO TABS: 25.0000 mg | ORAL_TABLET | Freq: Three times a day (TID) | ORAL | Status: DC | PRN

## 2019-05-20 MED ORDER — VANCOMYCIN HCL 1500 MG/300ML IV SOLN
1500.0000 mg | Freq: Once | INTRAVENOUS | Status: AC
  Administered 2019-05-20: 1500 mg via INTRAVENOUS
  Filled 2019-05-20: qty 300

## 2019-05-20 MED ORDER — PRAVASTATIN SODIUM 20 MG PO TABS
40.0000 mg | ORAL_TABLET | Freq: Every day | ORAL | Status: DC
  Administered 2019-05-20 – 2019-05-23 (×4): 40 mg via ORAL
  Filled 2019-05-20 (×4): qty 2

## 2019-05-20 MED ORDER — INSULIN ASPART 100 UNIT/ML ~~LOC~~ SOLN: 0.0000 [IU] | Freq: Every day | SUBCUTANEOUS | Status: DC

## 2019-05-20 MED ORDER — ACETAMINOPHEN 500 MG PO TABS
1000.0000 mg | ORAL_TABLET | Freq: Once | ORAL | Status: AC
  Administered 2019-05-20: 1000 mg via ORAL
  Filled 2019-05-20: qty 2

## 2019-05-20 MED ORDER — SODIUM CHLORIDE 0.9 % IV SOLN: INTRAVENOUS | Status: DC

## 2019-05-20 MED ORDER — ASCORBIC ACID 500 MG PO TABS
250.0000 mg | ORAL_TABLET | Freq: Every day | ORAL | Status: DC
  Administered 2019-05-20 – 2019-05-24 (×4): 250 mg via ORAL
  Filled 2019-05-20 (×4): qty 1

## 2019-05-20 MED ORDER — HEPARIN SODIUM (PORCINE) 5000 UNIT/ML IJ SOLN
5000.0000 [IU] | Freq: Three times a day (TID) | INTRAMUSCULAR | Status: DC
  Administered 2019-05-20 – 2019-05-24 (×12): 5000 [IU] via SUBCUTANEOUS
  Filled 2019-05-20 (×13): qty 1

## 2019-05-20 MED ORDER — VANCOMYCIN HCL IN DEXTROSE 1-5 GM/200ML-% IV SOLN
1000.0000 mg | INTRAVENOUS | Status: DC
  Administered 2019-05-21: 1000 mg via INTRAVENOUS
  Filled 2019-05-20 (×2): qty 200

## 2019-05-20 MED ORDER — METRONIDAZOLE IN NACL 5-0.79 MG/ML-% IV SOLN
500.0000 mg | Freq: Once | INTRAVENOUS | Status: AC
  Administered 2019-05-20: 500 mg via INTRAVENOUS
  Filled 2019-05-20: qty 100

## 2019-05-20 MED ORDER — SODIUM CHLORIDE 0.9 % IV BOLUS
1000.0000 mL | Freq: Once | INTRAVENOUS | Status: AC
  Administered 2019-05-20: 1000 mL via INTRAVENOUS

## 2019-05-20 MED ORDER — SODIUM CHLORIDE 0.9 % IV SOLN
2.0000 g | Freq: Once | INTRAVENOUS | Status: AC
  Administered 2019-05-20: 2 g via INTRAVENOUS
  Filled 2019-05-20: qty 2

## 2019-05-20 MED ORDER — ONDANSETRON HCL 4 MG/2ML IJ SOLN: 4.0000 mg | Freq: Three times a day (TID) | INTRAMUSCULAR | Status: DC | PRN

## 2019-05-20 MED ORDER — FLUTICASONE PROPIONATE 50 MCG/ACT NA SUSP
2.0000 | Freq: Every day | NASAL | Status: DC | PRN
  Filled 2019-05-20: qty 16

## 2019-05-20 MED ORDER — VANCOMYCIN HCL IN DEXTROSE 1-5 GM/200ML-% IV SOLN
1000.0000 mg | Freq: Once | INTRAVENOUS | Status: DC
  Filled 2019-05-20: qty 200

## 2019-05-20 MED ORDER — ACETAMINOPHEN 325 MG PO TABS
650.0000 mg | ORAL_TABLET | Freq: Four times a day (QID) | ORAL | Status: DC | PRN
  Administered 2019-05-20 – 2019-05-22 (×3): 650 mg via ORAL
  Filled 2019-05-20 (×3): qty 2

## 2019-05-20 NOTE — Progress Notes (Deleted)
[facility] NOTE  Patient Care Team: [name], MD as PCP - General (Internal Medicine)  CHIEF COMPLAINTS/PURPOSE OF CONSULTATION: SMALL CELL LYMPHOMA/CLL  #  Oncology History Overview Note  # October 2019- SMALL CELL LYMPHOMA; ki-67-10%. STAGE- III/ IV; PET scan- bulky LN [Dr.Vaught] above/below Diaphragm; No spleen/liver/bone;   # 22nd October 2019- Bil PE on lovenox xstopped; NOV 2019- Eliquis 5 mg BID. AUG 2020-slight incresae in RP LN ~1cm; continue Ibrutinib;  # worsening Anemia- Jan 2021- Hb 8.5; STOP eliquis; FOBT- positive; referral to GI/pt declines  # DM/HTN/ CKD- stage III -----------------------------------------------   MOLECULAR TESTING: Trisomy 12. 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  Patient wife available at the appointment today.  [name] 69 y.o.  male above diagnosis of of SLL/CLL on ibrutinib; incidental bilateral PE on currently off Eliquis [secondary to worsening anemia] is here for follow-up.   Patient finally agreed to make an appointment with the GI.  Has appointment with with Dr. Allen Norris in April.  As per wife is more tired.  Intermittently confused.  Poor appetite.  Losing weight.  He was urinary incontinent in the clinic.  Low-grade fevers noted in the clinic noted   Review of Systems  Unable to perform ROS: Mental acuity     MEDICAL HISTORY:  Past Medical History:  Diagnosis Date  . Anxiety   . Arthritis   . 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 HOH (hard of hearing)   . Hypertension   . 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 Seasonal allergies   . Type II diabetes mellitus ([place])    diet controlled    SURGICAL HISTORY: Past Surgical History:  Procedure Laterality Date  . AMPUTATION  06/08/2011   Procedure: AMPUTATION RAY;  Surgeon: Wylene Simmer, MD;  Location: [facility];  Service: Orthopedics;  Laterality: Right;  [place] Amputation  . [place]   "crushed"  . FRACTURE SURGERY    . 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 PILONIDAL CYST / SINUS EXCISION  1970's  . TOE AMPUTATION  06/08/11   partial; right great toe    SOCIAL HISTORY: Social History   Socioeconomic History  . Marital status: Married    Spouse name: Not on file  . Number of children: Not on file  . Years of education: Not on file  . Highest education level: Not on file  Occupational History  . Not on file  Tobacco Use  . Smoking status: Former Smoker    Packs/day: 1.50    Years: 22.00    Pack years: 33.00    Types: Cigarettes    Quit date: 05/24/2011    Years since quitting: 7.9  . Smokeless tobacco: Former Systems developer    Types: [place] date: 03/07/1983  Substance and Sexual Activity  . Alcohol use: Yes    Comment: 06/08/11 "no liquor for 2 1/2 years; last beer 05/31/11"  . Drug use: No  .  Sexual activity: Yes  Other Topics Concern  . Not on file  Social History Narrative   Married   Social Determinants of Health   Financial Resource Strain:   . Difficulty of Paying Living Expenses:   Food Insecurity:   . Worried About Charity fundraiser in the Last Year:   . Arboriculturist in the Last Year:   Transportation Needs:   . Film/video editor (Medical):   Marland Kitchen Lack of Transportation (Non-Medical):   Physical Activity:   . Days of Exercise per Week:   . Minutes of Exercise per Session:   Stress:   . Feeling of Stress :   Social Connections:   . Frequency of Communication with Friends and Family:   . Frequency of Social Gatherings with Friends and Family:   . Attends  Religious Services:   . Active Member of Clubs or Organizations:   . Attends Archivist Meetings:   Marland Kitchen Marital Status:   Intimate Partner Violence:   . Fear of Current or Ex-Partner:   . Emotionally Abused:   Marland Kitchen Physically Abused:   . Sexually Abused:     FAMILY HISTORY: Family History  Problem Relation Age of Onset  . Prostate cancer Father   . Hypertension Father   . Other Mother        VARICOSE VEINS    ALLERGIES:  is allergic to penicillins.  MEDICATIONS:  Current Outpatient Medications  Medication Sig Dispense Refill  . amLODipine (NORVASC) 5 MG tablet Take 1 tablet (5 mg total) by mouth daily. 90 tablet 1  . ECHINACEA PO Take 760 mg by mouth daily.    . fluticasone (FLONASE) 50 MCG/ACT nasal spray Place 2 sprays into both nostrils daily as needed for allergies or rhinitis.    . Ginseng 100 MG CAPS Take 100 mg by mouth daily.    . IMBRUVICA 420 MG TABS TAKE 1 TABLET (420 MG) BY MOUTH DAILY. (Patient not taking: Reported on 05/13/2019) 28 tablet 3  . Iron-Vitamin C 65-125 MG TABS Take 1 tablet by mouth. VITRON C    . JANUVIA 100 MG tablet Take 100 mg by mouth at bedtime.   3  . metFORMIN (GLUCOPHAGE) 500 MG tablet Take 1,000 mg by mouth 2 (two) times daily.    . mupirocin ointment (BACTROBAN) 2 % APPLY WITH Q TIP THREE TIMES DAILY AS NEEDED FOR DRYNESS AND CRUSTING  3  . Omega-3 Fatty Acids (OMEGA 3 500) 500 MG CAPS Take 500 mg by mouth daily.    . ondansetron (ZOFRAN) 4 MG tablet TAKE 1 TABLET BY MOUTH EVERY 8 HOURS AS NEEDED FOR NAUSEA FOR VOMITING (Patient not taking: Reported on 05/06/2019) 20 tablet 2  . ONE TOUCH ULTRA TEST test strip USE 1 STRIP TO CHECK GLUCOSE ONCE A WEEK  99  . pravastatin (PRAVACHOL) 40 MG tablet Take 40 mg by mouth at bedtime.      No current facility-administered medications for this visit.      Marland Kitchen  PHYSICAL EXAMINATION: ECOG PERFORMANCE STATUS: 1 - Symptomatic but completely ambulatory  There were no vitals filed for this  visit. There were no vitals filed for this visit.  Physical Exam  Constitutional: He is well-developed, well-nourished, and in no distress.  Accompanied by his wife.  Appears confused.  In a wheelchair.  HENT:  Head: Normocephalic and atraumatic.  Mouth/Throat: Oropharynx is clear and moist. No oropharyngeal exudate.  Eyes: Pupils are equal, round, and reactive to light.  Cardiovascular: Normal rate and regular rhythm.  Pulmonary/Chest: Effort normal and breath sounds normal. No respiratory distress. He has no wheezes.  Abdominal: Soft. Bowel sounds are normal. He exhibits no distension and no mass. There is no abdominal tenderness. There is no rebound and no guarding.  Musculoskeletal:        General: No tenderness or edema. Normal range of motion.     Cervical back: Normal range of motion and neck supple.  Lymphadenopathy:  Significant improvement of the lymphadenopathy in the neck/resolved; scattered approximately 1 cm few lymph nodes present in the right left underarm.  Neurological:  Alert but oriented x1-2.  Skin: Skin is warm.  Psychiatric: Affect normal.     LABORATORY DATA:  I have reviewed the data as listed Lab Results  Component Value Date   WBC 10.5 05/13/2019   HGB 8.2 (L) 05/13/2019   HCT 29.0 (L) 05/13/2019   MCV 74.7 (L) 05/13/2019   PLT 525 (H) 05/13/2019   Recent Labs    05/06/19 1323 05/06/19 1323 05/06/19 1816 05/06/19 1816 05/07/19 0451 05/07/19 0451 05/08/19 0555 05/08/19 0921 05/09/19 0819 05/13/19 0944  NA 136   < > 134*   < > 136   < > 137  --  138 136  K 4.5   < > 4.5   < > 3.9   < > 3.9  --  3.8 4.4  CL 100   < > 103   < > 103   < > 104  --  106 103  CO2 24   < > 21*   < > 25   < > 25  --  25 23  GLUCOSE 162*   < > 177*   < > 166*   < > 158*  --  186* 131*  BUN 34*   < > 31*   < > 29*   < > 29*  --  33* 33*  CREATININE 1.50*   < > 1.38*   < > 1.42*   < > 1.38*  --  1.48* 1.59*  CALCIUM 11.3*   < > 10.6*   < > 10.3   < > 9.7 9.7 9.5  10.3  GFRNONAA 47*   < > 53*   < > 51*   < > 53*  --  48* 44*  GFRAA 55*   < > >60   < > 59*   < > >60  --  56* 51*  PROT 6.2*  --  5.7*  --  6.3*  --   --   --   --   --   ALBUMIN 2.8*  --  2.6*  --  2.6*  --   --   --   --   --   AST 12*  --  14*  --  11*  --   --   --   --   --   ALT 9  --  11  --  10  --   --   --   --   --   ALKPHOS 54  --  47  --  49  --   --   --   --   --   BILITOT 0.4  --  0.8  --  0.6  --   --   --   --   --   BILIDIR  --   --  0.2  --   --   --   --   --   --   --  IBILI  --   --  0.6  --   --   --   --   --   --   --    < > = values in this interval not displayed.    RADIOGRAPHIC STUDIES: I have personally reviewed the radiological images as listed and agreed with the findings in the report. CT ABDOMEN PELVIS WO CONTRAST  Result Date: 05/07/2019 CLINICAL DATA:  Hyperparathyroidism. Hypercalcemia. Small B-cell lymphoma, on immune therapy. Pulmonary emboli in September. Altered mental status and fever. EXAM: CT CHEST, ABDOMEN AND PELVIS WITHOUT CONTRAST TECHNIQUE: Multidetector CT imaging of the chest, abdomen and pelvis was performed following the standard protocol without IV contrast. COMPARISON:  01/24/2019 FINDINGS: CT CHEST FINDINGS Cardiovascular: Multifactorial degradation, including motion in the lower chest, patient arm position (not raised above the head), and lack of IV contrast. Aortic and branch vessel atherosclerosis. Tortuous thoracic aorta. Mild cardiomegaly. Multivessel coronary artery atherosclerosis. Mediastinum/Nodes: Left supraclavicular index node measures 2.3 cm on 07/02, increased from 1.3 cm on the prior. Low left jugular nodes measure up to 1.8 cm today versus 1.4 cm on the prior. Relatively similar multiple bilateral axillary nodes. Left paratracheal node measures 1.2 cm on 22/2, newly enlarged since the prior. Hilar regions poorly evaluated without intravenous contrast. Lungs/Pleura: No pleural fluid. Right greater than left subpleural  pulmonary nodules are similar, likely subpleural lymph nodes. Example at up to 6 mm on 72/4 in the right upper lobe. New superior segment left lower lobe clustered nodular airspace and ground-glass opacity, including on 74/4. Musculoskeletal: Right upper extremity edema and subcutaneous gas are incompletely imaged, including on 38/2. Question intramuscular hematoma, including on 44/2. No acute osseous abnormality. CT ABDOMEN PELVIS FINDINGS Hepatobiliary: Multifactorial degradation continuing into the upper abdomen. Grossly normal noncontrast appearance of the liver, gallbladder. Pancreas: Grossly normal pancreas. Spleen: Normal in size, without focal abnormality. Adrenals/Urinary Tract: Normal adrenal glands. Favor renal vascular calcifications over renal calculi. No hydronephrosis. Bladder wall thickening is mild, at least partially felt to be due to underdistention. Stomach/Bowel: Grossly normal stomach. Colonic stool burden suggests constipation. Normal small bowel caliber. Vascular/Lymphatic: Aortic atherosclerosis. Bulky abdominal adenopathy. Index left periaortic node measures 2.7 x 3.6 cm on 77/2 versus 2.2 x 2.8 cm at the same level on the prior. A dominant left periaortic nodal mass measures maximally 6.7 cm on 93/2 today versus 5.5 cm on the prior exam (when remeasured). Index left external iliac node measures 3.3 cm on 116/2 and is similar to on the prior exam (when remeasured). Reproductive: Moderate prostatomegaly. Other: No significant free fluid. Musculoskeletal: No acute osseous abnormality. Lumbosacral junction degenerative disc disease. IMPRESSION: 1. Multifactorial degradation, including lack of IV contrast, motion, patient arm position. 2. Since 01/24/2019, development of left lower lobe nodular airspace disease, suspicious for infection or aspiration. 3. Right upper extremity edema and gas with possible intramuscular hematoma. Correlate with recent trauma or attempted IV placement. If no  correlate history, deep venous thrombosis of the right upper extremity to would be a consideration and ultrasound suggested. 4. Progressive lymphoma within the neck, chest, abdomen, as detailed above. Pelvic adenopathy is relatively similar. 5. Prostatomegaly. Apparent bladder wall thickening is at least partially felt to be due to underdistention. Correlate for possible bladder outlet obstruction. 6. Coronary artery atherosclerosis. Aortic Atherosclerosis (ICD10-I70.0). 7.  Possible constipation. Electronically Signed   By: Abigail Miyamoto M.D.   On: 05/07/2019 16:57   CT Head Wo Contrast  Result Date: 05/06/2019 CLINICAL DATA:  Altered mental status, unclear  cause EXAM: CT HEAD WITHOUT CONTRAST TECHNIQUE: Contiguous axial images were obtained from the base of the skull through the vertex without intravenous contrast. COMPARISON:  None. 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 No CT evident large vascular territory or cortically based infarction is seen. No evidence of acute, hemorrhage, hydrocephalus, extra-axial collection or mass lesion/mass effect. Patchy areas of white matter hypoattenuation are most compatible with chronic microvascular angiopathy. Symmetric prominence of the ventricles, cisterns and sulci compatible with parenchymal volume loss. Vascular: Atherosclerotic calcification of the carotid siphons and intradural vertebral arteries. No hyperdense vessel. Skull: No calvarial fracture or suspicious osseous lesion. No scalp swelling or hematoma. Vascular calcium of the scalp, often seen in diabetes. Sinuses/Orbits: Paranasal sinuses and mastoid air cells are predominantly clear. Included orbital structures are unremarkable. Other: None IMPRESSION: 1. Multiple acquisition required due to patient motion artifact. 2. No convincing CT evidence of acute intracranial abnormality. 3. 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 4. Chronic microangiopathic changes with parenchymal volume loss. Intracranial atherosclerosis. Electronically Signed   By: Lovena Le M.D.   On: 05/06/2019 21:15   CT CHEST WO CONTRAST  Result Date: 05/07/2019 CLINICAL DATA:  Hyperparathyroidism. Hypercalcemia. Small B-cell lymphoma, on immune therapy. Pulmonary emboli in September. Altered mental status and fever. EXAM: CT CHEST, ABDOMEN AND PELVIS WITHOUT CONTRAST TECHNIQUE: Multidetector CT imaging of the chest, abdomen and pelvis was performed following the standard protocol without IV contrast. COMPARISON:  01/24/2019 FINDINGS: CT CHEST FINDINGS Cardiovascular: Multifactorial degradation, including motion in the lower chest, patient arm position (not raised above the head), and lack of IV contrast. Aortic and branch vessel atherosclerosis. Tortuous thoracic aorta. Mild cardiomegaly. Multivessel coronary artery atherosclerosis. Mediastinum/Nodes: Left supraclavicular index node measures 2.3 cm on 07/02, increased from 1.3 cm on the prior. Low left jugular nodes measure up to 1.8 cm today versus 1.4 cm on the prior. Relatively similar multiple bilateral axillary nodes. Left paratracheal node measures 1.2 cm on 22/2, newly enlarged since the prior. Hilar regions poorly evaluated without intravenous contrast. Lungs/Pleura: No pleural fluid. Right greater than left subpleural pulmonary nodules are similar, likely subpleural lymph nodes. Example at up to 6 mm on 72/4 in the right upper lobe. New superior segment left lower lobe clustered nodular airspace and ground-glass opacity, including on 74/4. Musculoskeletal: Right upper extremity edema and subcutaneous gas are incompletely imaged, including on 38/2. Question intramuscular hematoma, including on 44/2. No acute osseous abnormality. CT ABDOMEN PELVIS FINDINGS Hepatobiliary: Multifactorial degradation continuing into the upper abdomen. Grossly normal noncontrast appearance of the liver,  gallbladder. Pancreas: Grossly normal pancreas. Spleen: Normal in size, without focal abnormality. Adrenals/Urinary Tract: Normal adrenal glands. Favor renal vascular calcifications over renal calculi. No hydronephrosis. Bladder wall thickening is mild, at least partially felt to be due to underdistention. Stomach/Bowel: Grossly normal stomach. Colonic stool burden suggests constipation. Normal small bowel caliber. Vascular/Lymphatic: Aortic atherosclerosis. Bulky abdominal adenopathy. Index left periaortic node measures 2.7 x 3.6 cm on 77/2 versus 2.2 x 2.8 cm at the same level on the prior. A dominant left periaortic nodal mass measures maximally 6.7 cm on 93/2 today versus 5.5 cm on the prior exam (when remeasured). Index left external iliac node measures 3.3 cm on 116/2 and is similar to on the prior exam (when remeasured). Reproductive: Moderate prostatomegaly. Other: No significant free fluid. Musculoskeletal: No acute osseous abnormality. Lumbosacral junction degenerative disc disease. IMPRESSION: 1. Multifactorial degradation, including lack of IV contrast, motion, patient arm  position. 2. Since 01/24/2019, development of left lower lobe nodular airspace disease, suspicious for infection or aspiration. 3. Right upper extremity edema and gas with possible intramuscular hematoma. Correlate with recent trauma or attempted IV placement. If no correlate history, deep venous thrombosis of the right upper extremity to would be a consideration and ultrasound suggested. 4. Progressive lymphoma within the neck, chest, abdomen, as detailed above. Pelvic adenopathy is relatively similar. 5. Prostatomegaly. Apparent bladder wall thickening is at least partially felt to be due to underdistention. Correlate for possible bladder outlet obstruction. 6. Coronary artery atherosclerosis. Aortic Atherosclerosis (ICD10-I70.0). 7.  Possible constipation. Electronically Signed   By: Abigail Miyamoto M.D.   On: 05/07/2019 16:57   DG  Chest Portable 1 View  Result Date: 05/06/2019 CLINICAL DATA:  Fevers and altered mental status, history of lymphoma EXAM: PORTABLE CHEST 1 VIEW COMPARISON:  01/24/2019 CT, plain film from 11/13/2017 FINDINGS: Cardiac shadow is within normal limits. The lungs are well aerated bilaterally. No focal infiltrate or sizable effusion is seen. Aortic calcifications are noted. No acute bony abnormality is noted. Exuberant calcification at the first costochondral margins is noted bilaterally. IMPRESSION: No acute abnormality noted. Electronically Signed   By: Inez Catalina M.D.   On: 05/06/2019 22:18    ASSESSMENT & PLAN:   No problem-specific Assessment & Plan notes found for this encounter.  All questions were answered. The patient knows to call the clinic with any problems, questions or concerns.     Cammie Sickle, MD 05/20/2019 8:26 AM

## 2019-05-20 NOTE — ED Notes (Signed)
This RN spoke [place] with lab, for a urine culture add-on

## 2019-05-20 NOTE — ED Notes (Signed)
POC Covid negative, EDP [facility] notified to order 2nd swab.

## 2019-05-20 NOTE — Progress Notes (Addendum)
PHARMACIST - PHYSICIAN ORDER COMMUNICATION  CONCERNING: P&T Medication Policy on Herbal Medications  DESCRIPTION:  This patient's order for:  Echinacea  has been noted.  This product(s) is classified as an "herbal" or natural product. Due to a lack of definitive safety studies or FDA approval, nonstandard manufacturing practices, plus the potential risk of unknown drug-drug interactions while on inpatient medications, the Pharmacy and Therapeutics Committee does not permit the use of "herbal" or natural products of this type within [facility].   ACTION TAKEN: The pharmacy department is unable to verify this order at this time and your patient has been informed of this safety policy. Please reevaluate patient's clinical condition at discharge and address if the herbal or natural product(s) should be resumed at that time.  Lu Duffel, PharmD, BCPS Clinical Pharmacist 05/20/2019 6:37 PM

## 2019-05-20 NOTE — ED Provider Notes (Signed)
[facility] Emergency Department Provider Note       Time seen: ----------------------------------------- 10:31 AM on 05/20/2019 -----------------------------------------   I have reviewed the triage vital signs and the nursing notes. Level V caveat: History/ROS limited by altered mental status HISTORY   Chief Complaint Altered Mental Status    HPI [name] is a 69 y.o. male with a history of anxiety, arthritis, lymphoma, GERD hypertension, osteomyelitis, diabetes who presents to the ED for increased confusion.  Patient was recently seen at the cancer center with confusion and was noted to have an elevated calcium level and also received an iron infusion.  He does have a lymphoma and his last treatment was March 2.  Past Medical History:  Diagnosis Date  . Anxiety   . Arthritis   . Cancer ([place])   . 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 HOH (hard of hearing)   . Hypertension   . Osteomyelitis of toe ([place]) 06/08/11   right foot  . Seasonal allergies   . Type II diabetes mellitus ([place])    diet controlled    Patient Active Problem List   Diagnosis Date Noted  . Hypercalcemia of malignancy 05/06/2019  . AMS (altered mental status) 05/06/2019  . Iron deficiency anemia due to chronic blood loss 02/24/2019  . 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 Bilateral pulmonary embolism ([place]) 12/25/2017  . CKD (chronic kidney disease), stage III 12/25/2017  . Thoracic lymphadenopathy 12/25/2017  . PAD (peripheral artery disease) ([place]) 10/23/2011  . HLD (hyperlipidemia) 07/13/2011  . Osteomyelitis of toe of right foot ([place]) 06/26/2011  . 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 Diabetes mellitus, type 2 ([place]) 01/26/2009  . Essential hypertension 01/26/2009  . Below-knee amputation ([place]) 01/26/2009    Past Surgical History:  Procedure Laterality Date  . AMPUTATION  06/08/2011   Procedure: AMPUTATION  RAY;  Surgeon: Wylene Simmer, MD;  Location: [place];  Service: Orthopedics;  Laterality: Right;  Righ Hallux Amputation  . [place]   "crushed"  . FRACTURE SURGERY    . 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 PILONIDAL CYST / SINUS EXCISION  1970's  . TOE AMPUTATION  06/08/11   partial; right great toe    Allergies Penicillins  Social History Social History   Tobacco Use  . Smoking status: Former Smoker    Packs/day: 1.50    Years: 22.00    Pack years: 33.00    Types: Cigarettes    Quit date: 05/24/2011    Years since quitting: 7.9  . Smokeless tobacco: Former Systems developer    Types: Chew    Quit date: 03/07/1983  Substance Use Topics  . Alcohol use: Yes    Comment: 06/08/11 "no liquor for 2 1/2 years; last beer 05/31/11"  . Drug use: No    Review of Systems Unknown, positive for fever, confusion  All systems negative/normal/unremarkable except as stated in the HPI  ____________________________________________   PHYSICAL EXAM:  VITAL SIGNS: ED Triage Vitals  Enc Vitals Group     BP 05/20/19 1014 (!) 149/61     Pulse Rate 05/20/19 1014 (!) 106     Resp 05/20/19 1014 18     Temp 05/20/19 1014 (!) 101.5 F (38.6 C)     Temp Source 05/20/19 1014 Oral     SpO2 05/20/19 1014 100 %     Weight 05/20/19 1015  168 lb (76.2 kg)     Height 05/20/19 1015 6\' 1"  (1.854 m)     Head Circumference --      Peak Flow --      Pain Score 05/20/19 1015 0     Pain Loc --      Pain Edu? --      Excl. in [place]? --     Constitutional: Alert but disoriented.  No obvious distress Eyes: Conjunctivae are normal. Normal extraocular movements. ENT      Head: Normocephalic and atraumatic.      Nose: No congestion/rhinnorhea.      Mouth/Throat: Mucous membranes are moist.      Neck: No stridor. Cardiovascular: Rapid rate, regular rhythm. No murmurs, rubs, or gallops. Respiratory: Normal respiratory effort without tachypnea nor retractions. Breath sounds are clear and equal  bilaterally. No wheezes/rales/rhonchi. Gastrointestinal: Soft and nontender. Normal bowel sounds Musculoskeletal: Nontender with normal range of motion in extremities. No lower extremity tenderness nor edema. Neurologic:  Normal speech and language.  Generalized weakness, nothing focal Skin:  Skin is warm, dry and intact. No rash noted. Psychiatric: Mood and affect are normal. Speech and behavior are normal.  ____________________________________________  ED COURSE:  As part of my medical decision making, I reviewed the following data within the [place] History obtained from family if available, nursing notes, old chart and ekg, as well as notes from prior ED visits. Patient presented for fever and confusion in the setting of lymphoma, we will assess with labs and imaging as indicated at this time. Recent CT results Progressive lymphoma within the neck, chest, abdomen, as detailed above.   Procedures  [name] was evaluated in Emergency Department on 05/20/2019 for the symptoms described in the history of present illness. He was evaluated in the context of the global COVID-19 pandemic, which necessitated consideration that the patient might be at risk for infection with the SARS-CoV-2 virus that causes COVID-19. Institutional protocols and algorithms that pertain to the evaluation of patients at risk for COVID-19 are in a state of rapid change based on information released by regulatory bodies including the CDC and federal and state organizations. These policies and algorithms were followed during the patient's care in the ED.  ____________________________________________   LABS (pertinent positives/negatives)  Labs Reviewed  CBC WITH DIFFERENTIAL/PLATELET - Abnormal; Notable for the following components:      Result Value   Hemoglobin 9.7 (*)    HCT 33.2 (*)    MCV 75.1 (*)    MCH 21.9 (*)    MCHC 29.2 (*)    RDW 19.4 (*)    All other components within  normal limits  COMPREHENSIVE METABOLIC PANEL - Abnormal; Notable for the following components:   Glucose, Bld 177 (*)    BUN 32 (*)    Creatinine, Ser 1.84 (*)    Albumin 3.1 (*)    GFR calc non Af Amer 37 (*)    GFR calc Af Amer 43 (*)    All other components within normal limits  URINALYSIS, COMPLETE (UACMP) WITH MICROSCOPIC - Abnormal; Notable for the following components:   Color, Urine YELLOW (*)    APPearance HAZY (*)    Protein, ur 100 (*)    Bacteria, UA RARE (*)    All other components within normal limits  LACTIC ACID, PLASMA - Abnormal; Notable for the following components:   Lactic Acid, Venous 2.1 (*)    All other components within normal limits  TROPONIN  I (HIGH SENSITIVITY) - Abnormal; Notable for the following components:   Troponin I (High Sensitivity) 21 (*)    All other components within normal limits  CULTURE, BLOOD (ROUTINE X 2)  CULTURE, BLOOD (ROUTINE X 2)  URINE CULTURE  LACTIC ACID, PLASMA  POC SARS CORONAVIRUS 2 AG -  ED  TROPONIN I (HIGH SENSITIVITY)    RADIOLOGY Images were viewed by me  Chest x-ray, CT head IMPRESSION: No acute process in the chest ____________________________________________   DIFFERENTIAL DIAGNOSIS   Sepsis, Sirs, lymphoma, dehydration, electrolyte abnormality, UTI, pneumonia, hypercalcemia  FINAL ASSESSMENT AND PLAN  Altered mental status   Plan: The patient had presented for altered mental status and was found to be febrile. Patient's labs revealed mild leukocytosis and mild dehydration which were treated with IV fluids. Patient's imaging has not revealed any acute process at this time.  No clear etiology for his fever.  Have discussed with his oncologist who recommends hospital admission.  I did give broad-spectrum antibiotics until we can ascertain the cause of his fever.   Laurence Aly, MD    Note: This note was generated in part or whole with voice recognition software. Voice recognition is usually quite  accurate but there are transcription errors that can and very often do occur. I apologize for any typographical errors that were not detected and corrected.     Earleen Newport, MD 05/20/19 1242

## 2019-05-20 NOTE — H&P (Signed)
History and Physical    [name] [id number] DOB: Oct 16, 1950 DOA: 05/20/2019  Referring MD/NP/PA:   PCP: [name], MD   Patient coming from:  The patient is coming from home.  At baseline, pt is independent for most of ADL.        Chief Complaint: fever and confusion  HPI: [name] is a 69 y.o. 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  Per his wife, pt is confused today.  He moves all extremities normally.  No facial droop or slurred speech. He was noted have fever and chills.  He has some mild dry cough, but no shortness of breath, chest pain.  He has increased urinary frequency, no dysuria or burning on urination.  No active nausea, vomiting, abdominal pain.  Of note, pt recently had confusion and found to have elevated calcium and had an iron infusion in cancer center.  Patient is doing chemotherapy for lymphoma, last treatment was on 05/06/2019. His oncologist, Dr. Rogue Bussing recommended patient come to ED for further evaluation and treatment  ED Course: pt was found to have WBC 9.6, troponin 21, 20, BNP 90, lactic acid 2.1, negative Covid PCR, WBC 9.6, slightly abnormal urinalysis (hazy appearance, negative leukocyte, rare bacteria, WBC 6-10), worsening renal function, temperature of 1.5, blood pressure 149/61, tachycardia, tachypnea, oxygen saturation 98% on room air.  Chest x-ray negative.  Patient is admitted to [name] bed as inpatient.   Review of Systems: Could not be reviewed accurately due to altered mental status   Allergy:  Allergies  Allergen Reactions  . Penicillins Anaphylaxis and Other (See Comments)    Has patient had a PCN reaction causing immediate rash, facial/tongue/throat swelling, SOB or lightheadedness with hypotension: Unknown Has patient  had a PCN reaction causing severe rash involving mucus membranes or skin necrosis: Unknown Has patient had a PCN reaction that required hospitalization: Unknown Has patient had a PCN reaction occurring within the last 10 years: No If all of the above answers are "NO", then may proceed with Cephalosporin use.     Past Medical History:  Diagnosis Date  . Anxiety   . Arthritis   . Cancer ([place])   . 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 HOH (hard of hearing)   . Hypertension   . Osteomyelitis of toe ([place]) 06/08/11   right foot  . Seasonal allergies   . Type II diabetes mellitus ([place])    diet controlled    Past Surgical History:  Procedure Laterality Date  . AMPUTATION  06/08/2011   Procedure: AMPUTATION RAY;  Surgeon: Wylene Simmer, MD;  Location: [place];  Service: Orthopedics;  Laterality: Right;  Righ Hallux Amputation  . [place]   "crushed"  . FRACTURE SURGERY    . 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 PILONIDAL CYST / SINUS EXCISION  1970's  . TOE AMPUTATION  06/08/11   partial; right great toe    Social History:  reports that he quit smoking about 7 years ago. His smoking use included cigarettes. He has a 33.00 pack-year smoking history. He quit smokeless tobacco use about 36 years ago.  His smokeless tobacco use included chew. He reports current alcohol use. He reports that he does not use drugs.  Family History:  Family History  Problem Relation Age of Onset  . Prostate cancer Father   . Hypertension Father   . Other Mother        VARICOSE VEINS     Prior to Admission medications   Medication Sig Start Date End Date Taking? Authorizing Provider  amLODipine (NORVASC) 5 MG tablet Take 1 tablet (5 mg total) by mouth daily. 04/09/19  Yes Cammie Sickle, MD  ECHINACEA PO Take 760 mg by mouth daily.   Yes [provider]  Iron-Vitamin C 65-125 MG TABS Take 1 tablet by mouth. VITRON C   Yes [provider]  JANUVIA 100 MG  tablet Take 100 mg by mouth at bedtime.  10/12/17  Yes [provider]  metFORMIN (GLUCOPHAGE) 500 MG tablet Take 1,000 mg by mouth 2 (two) times daily. 11/10/17  Yes [provider]  pravastatin (PRAVACHOL) 40 MG tablet Take 40 mg by mouth at bedtime.  11/10/17  Yes [provider]  fluticasone (FLONASE) 50 MCG/ACT nasal spray Place 2 sprays into both nostrils daily as needed for allergies or rhinitis.    [provider]  IMBRUVICA 420 MG TABS TAKE 1 TABLET (420 MG) BY MOUTH DAILY. Patient not taking: Reported on 05/20/2019 03/25/19   Cammie Sickle, MD  mupirocin ointment (BACTROBAN) 2 % APPLY WITH Q TIP THREE TIMES DAILY AS NEEDED FOR DRYNESS AND CRUSTING 01/18/18   [provider]  ondansetron (ZOFRAN) 4 MG tablet TAKE 1 TABLET BY MOUTH EVERY 8 HOURS AS NEEDED FOR NAUSEA FOR VOMITING Patient not taking: Reported on 05/06/2019 12/09/18   Cammie Sickle, MD    Physical Exam: Vitals:   05/20/19 1330 05/20/19 1400 05/20/19 1500 05/20/19 1519  BP: (!) 113/54 (!) 118/54 (!) 106/51   Pulse: 90 83 77   Resp:   (!) 24   Temp:    99.3 F (37.4 C)  TempSrc:    Oral  SpO2: 100% 98% 98%   Weight:      Height:       General: Not in acute distress HEENT:       Eyes: PERRL, EOMI, no scleral icterus.       ENT: No discharge from the ears and nose, no pharynx injection, no tonsillar enlargement.        Neck: No JVD, no bruit, no mass felt. Heme: No neck lymph node enlargement. Cardiac: S1/S2, RRR, No murmurs, No gallops or rubs. Respiratory: No rales, wheezing, rhonchi or rubs. GI: Soft, nondistended, nontender, no organomegaly, BS present. GU: No hematuria Ext: No pitting leg edema bilaterally. S/p of left BKA. Musculoskeletal: No joint deformities, No joint redness or warmth, no limitation of ROM in spin. Skin: No rashes.  Neuro: Confused, knows his own name, knows that he is in the hospital, but is not oriented to time, cranial nerves II-XII  grossly intact, moves all extremities. Psych: Patient is not psychotic, no suicidal or hemocidal ideation.  Labs on Admission: I have personally reviewed following labs and imaging studies  CBC: Recent Labs  Lab 05/20/19 1033  WBC 9.6  NEUTROABS 7.0  HGB 9.7*  HCT 33.2*  MCV 75.1*  PLT 0000000   Basic Metabolic Panel: Recent Labs  Lab 05/20/19 1033  NA 135  K 4.1  CL 101  CO2 24  GLUCOSE 177*  BUN 32*  CREATININE 1.84*  CALCIUM 9.7   GFR: Estimated Creatinine Clearance: 42 mL/min (A) (by C-G formula based on SCr of 1.84 mg/dL (H)). Liver Function Tests: Recent Labs  Lab 05/20/19 1033  AST 17  ALT 12  ALKPHOS 55  BILITOT 0.5  PROT 6.7  ALBUMIN 3.1*   No results for input(s): LIPASE, AMYLASE in the last 168 hours. No results for input(s): AMMONIA in the last 168 hours. Coagulation Profile: No results for input(s): INR, PROTIME in the last 168 hours. Cardiac Enzymes: No results for input(s): CKTOTAL, CKMB, CKMBINDEX, TROPONINI in the last 168 hours. BNP (last 3 results) No results for input(s): PROBNP in the last 8760 hours. HbA1C: No results for input(s): HGBA1C in the last 72 hours. CBG: No results for input(s): GLUCAP in the last 168 hours. Lipid Profile: No results for input(s): CHOL, HDL, LDLCALC, TRIG, CHOLHDL, LDLDIRECT in the last 72 hours. Thyroid Function Tests: No results for input(s): TSH, T4TOTAL, FREET4, T3FREE, THYROIDAB in the last 72 hours. Anemia Panel: No results for input(s): VITAMINB12, FOLATE, FERRITIN, TIBC, IRON, RETICCTPCT in the last 72 hours. Urine analysis:    Component Value Date/Time   COLORURINE YELLOW (A) 05/20/2019 1033   APPEARANCEUR HAZY (A) 05/20/2019 1033   LABSPEC 1.013 05/20/2019 1033   PHURINE 5.0 05/20/2019 1033   GLUCOSEU NEGATIVE 05/20/2019 1033   HGBUR NEGATIVE 05/20/2019 1033   BILIRUBINUR NEGATIVE 05/20/2019 1033   KETONESUR NEGATIVE 05/20/2019 1033   PROTEINUR 100 (A) 05/20/2019 1033   UROBILINOGEN 1.0  01/13/2009 0329   NITRITE NEGATIVE 05/20/2019 1033   LEUKOCYTESUR NEGATIVE 05/20/2019 1033   Sepsis Labs: @LABRCNTIP (procalcitonin:4,lacticidven:4) ) Recent Results (from the past 240 hour(s))  Respiratory Panel by RT PCR (Flu A&B, Covid) - Nasopharyngeal Swab     Status: None   Collection Time: 05/20/19  1:21 PM   Specimen: Nasopharyngeal Swab  Result Value Ref Range Status   SARS Coronavirus 2 by RT PCR NEGATIVE NEGATIVE Final    Comment: (NOTE) SARS-CoV-2 target nucleic acids are NOT DETECTED. The SARS-CoV-2 RNA is generally detectable in upper respiratoy specimens during the acute phase of infection. The lowest concentration of SARS-CoV-2 viral copies this assay can detect is 131 copies/mL. A negative result does not preclude SARS-Cov-2 infection and should not be used as the sole basis for treatment or other patient management decisions. A negative result may occur with  improper specimen collection/handling, submission of specimen other than nasopharyngeal swab, presence of viral mutation(s) within the areas targeted by this assay, and inadequate number of viral copies (<131 copies/mL). A negative result must be combined with clinical observations, patient history, and epidemiological information. The expected result is Negative. Fact Sheet for Patients:  PinkCheek.be Fact Sheet for Healthcare Providers:  GravelBags.it This test is not yet ap proved or cleared by the [place] FDA and  has been authorized for detection and/or diagnosis of SARS-CoV-2 by FDA under an Emergency Use Authorization (EUA). This EUA will remain  in effect (meaning this test can be used) for the duration of the COVID-19 declaration under Section 564(b)(1) of the Act, 21 U.S.C. section 360bbb-3(b)(1), unless the authorization is terminated or revoked sooner.    Influenza A by PCR NEGATIVE NEGATIVE Final   Influenza B by PCR NEGATIVE  NEGATIVE Final    Comment: (NOTE) The Xpert Xpress SARS-CoV-2/FLU/RSV assay is intended as an aid in  the diagnosis of influenza from Nasopharyngeal swab specimens and  should not be used as a sole basis for treatment. Nasal washings and  aspirates are unacceptable for Xpert Xpress SARS-CoV-2/FLU/RSV  testing. Fact Sheet for Patients: PinkCheek.be Fact Sheet for Healthcare Providers: GravelBags.it This test is not yet approved or cleared by the  [place] Architectural technologist and  has been authorized for detection and/or diagnosis of SARS-CoV-2 by  FDA under an Print production planner (EUA). This EUA will remain  in effect (meaning this test can be used) for the duration of the  Covid-19 declaration under Section 564(b)(1) of the Act, 21  U.S.C. section 360bbb-3(b)(1), unless the authorization is  terminated or revoked. Performed at [facility], [address] [place], [place] [address]      Radiological Exams on Admission: DG Chest 1 View  Result Date: 05/20/2019 CLINICAL DATA:  Fever, altered mental status EXAM: CHEST  1 VIEW COMPARISON:  None. FINDINGS: The heart size and mediastinal contours are within normal limits. Both lungs are clear. No pleural effusion or pneumothorax. The visualized skeletal structures are unremarkable. IMPRESSION: No acute process in the chest Electronically Signed   By: Macy Mis M.D.   On: 05/20/2019 10:57     EKG:  Not done in ED, will get one.   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  Elevated lactic acid 2.1.  Etiology is not clear.  Chest x-ray negative.  Patient reports increased urinary frequency, but urinalysis is slightly abnormal.  Patient is immunosuppressed due to chemotherapy, will need broad antibiotics.  -Admit to MedSurg bed as inpatient -Vancomycin and cefepime (patient received 1 dose of Flagyl and aztreonam in ED) -will get Procalcitonin and trend lactic acid levels per sepsis protocol. -IVF: 2L of NS bolus in ED, followed by 100 cc/h  -f/u Bx and Ux  Type II diabetes mellitus with renal manifestations ([place]): Most recent A1c 7.3, poorly controled. 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  Followed up by Dr. Yevette Edwards -f/u with oncology  Iron deficiency anemia due to chronic blood loss: hgb 9.7 (8.2 on 05/13/19) -Continue iron supplement  Acute metabolic encephalopathy: Possibly due to sepsis -Frequent neuro check -f/u CT-head  Elevated troponin: trop 21 -->20. No CP. Already trending down, likely due to stress-demand ischemia. -Continue pravastatin    Inpatient status:  # Patient requires inpatient status due to high intensity of service, high risk for further deterioration and high frequency of surveillance required.  I certify that at the point of admission it is my clinical judgment that the patient will require inpatient hospital care spanning beyond 2 midnights from  the point of admission.  . 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 .  . Now patient has presenting with sepsis with  unclear etiology, also has acute metabolic encephalopathy . The worrisome physical exam findings include confusion . The initial radiographic and laboratory data are worrisome because of sepsis, leukocytosis, elevated troponin, elevated lactic acid, worsening renal function. . Current medical needs: please see my assessment and plan . Predictability of an adverse outcome (risk): Patient has multiple comorbidities as listed above. Now presents with sepsis with unclear etiology, also has acute metabolic encephalopathy. Patient's presentation is highly complicated.  Patient is at high risk of deteriorating.  Will need to be treated in hospital for at least 2 days.            DVT ppx: SQ Heparin   Code Status: Full code per his wife Family Communication:  Yes, patient's wife at bed side Disposition Plan:  Anticipate discharge back to previous home environment Consults called:  none Admission status: Med-surg bed as inpt     Date of Service 05/20/2019    [facility]   If 7PM-7AM, please contact night-coverage [email or web address] 05/20/2019, 3:44 PM

## 2019-05-20 NOTE — ED Triage Notes (Signed)
Pt is here with his wife with increased confusion, states he was just seen at the Cancer center 3/2 with confusion elevated calcium and had an iron infusion. Pt has lymphoma and last treatment was 3/2.

## 2019-05-20 NOTE — Assessment & Plan Note (Deleted)
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 On Ibrutinib 420 mg/day; CT nov 2020- CT C/A/P- STABLE ~4 cm/bulky retroperitoneal adenopathy.  Currently on Imbruvica.  #However concerns of progression of disease [hypercalcemia-see below]; recommend PET scan for further evaluation ASAP.  If not then CT scan chest and pelvis needs to be done.  Okay to discontinue Imbruvica.  #Low-grade fevers-unclear etiology; recommend further evaluation emergency room including chest x-ray/UA; Covid testing etc.  #Hypercalcemia-concerning for malignancy related-mental status changes; recommend Zometa.   #Anemia-hemoglobin 8.3- stable;  -iron deficiency/ FOBT positive; patient likely need GI evaluation in the hospital.  #Bilateral PE [sep 2019]-HELD Eliquis [jan 18th 2020- held- sec to worsening anemia].  Stable    DISPOSITION: #Patient sent to emergency room.

## 2019-05-20 NOTE — Telephone Encounter (Signed)
Patient came to cancer center for a routine follow-up. Temp upon arrival was 102.5 under the tongue. Pt c/o chills. Stated, "I feel like I have the flu." Wife has noted that patient continues to be confused. Per Dr. Rogue Bussing - patient should go to the ER for symptoms to be address.  RN escorted patient to ER via wheelchair. Patient's wife accompanied patient and RN. She is the key historian to patient's care given patient's confusion.  Staff at screening table voiced saftey concerns about the level of patient's confusion and that he continues to drive himself/wife to his doctor's apt. Dr. Rogue Bussing made aware of this concern by myself. Md states that he has spoken to pt's wife in the past about the patient driving and discouraged patient's driving.

## 2019-05-20 NOTE — ED Notes (Signed)
Date and time results received: 05/20/19 11:41 AM   Test: Lactic acid Critical Value: 2.1    Name of Provider Notified: Dr. Jimmye Norman  Orders Received? Or Actions Taken?: no new orders at this time

## 2019-05-20 NOTE — Consult Note (Signed)
Pharmacy Antibiotic Note  [name] is a 69 y.o. male admitted on 05/20/2019 with sepsis.  Pharmacy has been consulted for Cefepime/Vancomycin dosing.  Plan: Pt receiving loading dose 1500mg  IV Vancomycin x 1 in the ED  Will start Vancomycin 1000 mg IV Q 24 hrs.  Goal AUC 400-550. Expected AUC: 464 SCr used: 1.84  Will start Cefepime 2g q12H  Will follow Scr in am and adjust dosing if needed  Height: 6\' 1"  (185.4 cm) Weight: 168 lb (76.2 kg) IBW/kg (Calculated) : 79.9  Temp (24hrs), Avg:101.5 F (38.6 C), Min:101.5 F (38.6 C), Max:101.5 F (38.6 C)  Recent Labs  Lab 05/20/19 1033 05/20/19 1231  WBC 9.6  --   CREATININE 1.84*  --   LATICACIDVEN 2.1* 1.5    Estimated Creatinine Clearance: 42 mL/min (A) (by C-G formula based on SCr of 1.84 mg/dL (H)).    Allergies  Allergen Reactions  . Penicillins Anaphylaxis and Other (See Comments)    Has patient had a PCN reaction causing immediate rash, facial/tongue/throat swelling, SOB or lightheadedness with hypotension: Unknown Has patient had a PCN reaction causing severe rash involving mucus membranes or skin necrosis: Unknown Has patient had a PCN reaction that required hospitalization: Unknown Has patient had a PCN reaction occurring within the last 10 years: No If all of the above answers are "NO", then may proceed with Cephalosporin use.     Antimicrobials this admission: 3/16 Aztreonam/Metronidazole x 1 3/16 Vancomycin >>  3/16 Cefepime >>  Dose adjustments this admission: none  Microbiology results: 3/16 BCx: pending 3/16 UCx: pending   3/16 COVID/FLU NEG  Thank you for allowing pharmacy to be a part of this patient's care.  Lu Duffel, PharmD, BCPS Clinical Pharmacist 05/20/2019 2:41 PM

## 2019-05-20 NOTE — ED Notes (Signed)
Wife at bedside reports given pt tylenol for a "fever 99.9 at 1030pm, last night".

## 2019-05-20 NOTE — ED Notes (Signed)
Pt reports she had to allow him to drive here this morning even in his state of confusion

## 2019-05-21 ENCOUNTER — Inpatient Hospital Stay: Payer: Medicare Other

## 2019-05-21 DIAGNOSIS — E785 Hyperlipidemia, unspecified: Secondary | ICD-10-CM

## 2019-05-21 DIAGNOSIS — E1121 Type 2 diabetes mellitus with diabetic nephropathy: Secondary | ICD-10-CM

## 2019-05-21 DIAGNOSIS — E43 Unspecified severe protein-calorie malnutrition: Secondary | ICD-10-CM

## 2019-05-21 DIAGNOSIS — I10 Essential (primary) hypertension: Secondary | ICD-10-CM

## 2019-05-21 LAB — URINE CULTURE: Culture: NO GROWTH

## 2019-05-21 LAB — CBC
HCT: 28.4 % — ABNORMAL LOW (ref 39.0–52.0)
Hemoglobin: 8.4 g/dL — ABNORMAL LOW (ref 13.0–17.0)
MCH: 21.9 pg — ABNORMAL LOW (ref 26.0–34.0)
MCHC: 29.6 g/dL — ABNORMAL LOW (ref 30.0–36.0)
MCV: 74 fL — ABNORMAL LOW (ref 80.0–100.0)
Platelets: 310 10*3/uL (ref 150–400)
RBC: 3.84 MIL/uL — ABNORMAL LOW (ref 4.22–5.81)
RDW: 18.6 % — ABNORMAL HIGH (ref 11.5–15.5)
WBC: 8.3 10*3/uL (ref 4.0–10.5)
nRBC: 0 % (ref 0.0–0.2)

## 2019-05-21 LAB — BASIC METABOLIC PANEL
Anion gap: 7 (ref 5–15)
BUN: 31 mg/dL — ABNORMAL HIGH (ref 8–23)
CO2: 26 mmol/L (ref 22–32)
Calcium: 8.9 mg/dL (ref 8.9–10.3)
Chloride: 104 mmol/L (ref 98–111)
Creatinine, Ser: 1.84 mg/dL — ABNORMAL HIGH (ref 0.61–1.24)
GFR calc Af Amer: 43 mL/min — ABNORMAL LOW (ref >60)
GFR calc non Af Amer: 37 mL/min — ABNORMAL LOW (ref >60)
Glucose, Bld: 142 mg/dL — ABNORMAL HIGH (ref 70–99)
Potassium: 3.2 mmol/L — ABNORMAL LOW (ref 3.5–5.1)
Sodium: 137 mmol/L (ref 135–145)

## 2019-05-21 LAB — GLUCOSE, CAPILLARY
Glucose-Capillary: 126 mg/dL — ABNORMAL HIGH (ref 70–99)
Glucose-Capillary: 183 mg/dL — ABNORMAL HIGH (ref 70–99)
Glucose-Capillary: 130 mg/dL — ABNORMAL HIGH (ref 70–99)

## 2019-05-21 MED ORDER — NEPRO/CARBSTEADY PO LIQD
237.0000 mL | Freq: Three times a day (TID) | ORAL | Status: DC
  Administered 2019-05-22 – 2019-05-24 (×4): 237 mL via ORAL

## 2019-05-21 MED ORDER — ADULT MULTIVITAMIN W/MINERALS CH
1.0000 | ORAL_TABLET | Freq: Every day | ORAL | Status: DC
  Administered 2019-05-22 – 2019-05-24 (×2): 1 via ORAL
  Filled 2019-05-21 (×2): qty 1

## 2019-05-21 MED ORDER — POTASSIUM CHLORIDE CRYS ER 20 MEQ PO TBCR
40.0000 meq | EXTENDED_RELEASE_TABLET | Freq: Once | ORAL | Status: AC
  Administered 2019-05-21: 40 meq via ORAL
  Filled 2019-05-21: qty 2

## 2019-05-21 MED ORDER — SODIUM CHLORIDE 0.9 % IV SOLN: INTRAVENOUS | Status: DC

## 2019-05-21 NOTE — Progress Notes (Signed)
PT Cancellation Note  Patient Details Name: [name] MRN: [id number] DOB: October 31, 1950   Cancelled Treatment:    Reason Eval/Treat Not Completed: Other (comment). PT entered room, family at bedside. Pt appearing agitated and restless in bed, confused and HOH. PT assisted in repositioning pt in preparation for lunch. Pt yelling and tech at bedside at end of attempt. PT to re-attempt as able and pt able to follow commands.   Lieutenant Diego PT, DPT 11:30 AM,05/21/19

## 2019-05-21 NOTE — Progress Notes (Signed)
Patient ID: [name], male   DOB: 12-Dec-1950, 69 y.o.   MRN: [id number] [facility] PROGRESS NOTE  [name] [id number] DOB: September 20, 1950 DOA: 05/20/2019 PCP: [name], MD  HPI/Subjective: Patient awakened from sleep.  Feels worn out.  He was having fever over at Dr. Aletha Halim office and was sent in for further evaluation.  No cough.  No burning on urination. No diarrhea.  Following with oncology for treatment of lymphoma.  Objective: Vitals:   05/20/19 2107 05/21/19 0425  BP: (!) 147/64 (!) 165/67  Pulse: (!) 101 (!) 110  Resp: 16 20  Temp: 98.6 F (37 C) 98.3 F (36.8 C)  SpO2: 99% 95%    Intake/Output Summary (Last 24 hours) at 05/21/2019 1232 Last data filed at 05/21/2019 1026 Gross per 24 hour  Intake 240 ml  Output 1250 ml  Net -1010 ml   Filed Weights   05/20/19 1015  Weight: 76.2 kg    ROS: Review of Systems  Constitutional: Positive for fever.  Eyes: Negative for blurred vision.  Respiratory: Negative for cough and shortness of breath.   Cardiovascular: Negative for chest pain.  Gastrointestinal: Negative for abdominal pain, constipation, diarrhea, nausea and vomiting.  Genitourinary: Negative for dysuria.  Musculoskeletal: Negative for joint pain.  Neurological: Negative for headaches.   Exam: Physical Exam  HENT:  Nose: No mucosal edema.  Mouth/Throat: No oropharyngeal exudate or posterior oropharyngeal edema.  Eyes: Pupils are equal, round, and reactive to light. Conjunctivae and lids are normal.  Neck: Carotid bruit is not present.  Cardiovascular: S1 normal and S2 normal. Exam reveals no gallop.  No murmur heard. Respiratory: No respiratory distress. He has no wheezes. He has no rhonchi. He has no rales.  GI: Soft. Bowel sounds are normal. There is no abdominal tenderness.  Musculoskeletal:     Left knee: No swelling.     Right ankle: No swelling.  Lymphadenopathy:    He has no cervical adenopathy.   Neurological: He is alert. No cranial nerve deficit.  Skin: Skin is warm. No rash noted. Nails show no clubbing.  Psychiatric: He has a normal mood and affect.      Data Reviewed: Basic Metabolic Panel: Recent Labs  Lab 05/20/19 1033 05/21/19 0528  NA 135 137  K 4.1 3.2*  CL 101 104  CO2 24 26  GLUCOSE 177* 142*  BUN 32* 31*  CREATININE 1.84* 1.84*  CALCIUM 9.7 8.9   Liver Function Tests: Recent Labs  Lab 05/20/19 1033  AST 17  ALT 12  ALKPHOS 55  BILITOT 0.5  PROT 6.7  ALBUMIN 3.1*   CBC: Recent Labs  Lab 05/20/19 1033 05/21/19 0528  WBC 9.6 8.3  NEUTROABS 7.0  --   HGB 9.7* 8.4*  HCT 33.2* 28.4*  MCV 75.1* 74.0*  PLT 333 310   BNP (last 3 results) Recent Labs    05/20/19 1315  BNP 90.0     CBG: Recent Labs  Lab 05/20/19 2127 05/21/19 0803 05/21/19 1130  GLUCAP 134* 126* 183*    Recent Results (from the past 240 hour(s))  Blood culture (routine x 2)     Status: None (Preliminary result)   Collection Time: 05/20/19 10:33 AM   Specimen: BLOOD  Result Value Ref Range Status   Specimen Description BLOOD LEFT AC  Final   Special Requests   Final    BOTTLES DRAWN AEROBIC AND ANAEROBIC Blood Culture results may not be optimal due to an inadequate volume of blood  received in culture bottles   Culture   Final    NO GROWTH < 24 HOURS Performed at [facility], [address] [place], [place] [address]    Report Status PENDING  Incomplete  Urine Culture     Status: None   Collection Time: 05/20/19 10:33 AM   Specimen: Urine, Random  Result Value Ref Range Status   Specimen Description   Final    URINE, RANDOM Performed at [facility], [address], [place], [place] [address]    Special Requests   Final    NONE Performed at [facility], [address] [place], [place] [address]    Culture   Final    NO GROWTH Performed at [facility], [place] [address], [place], [place] [address]    Report Status  05/21/2019 FINAL  Final  Respiratory Panel by RT PCR (Flu A&B, Covid) - Nasopharyngeal Swab     Status: None   Collection Time: 05/20/19  1:21 PM   Specimen: Nasopharyngeal Swab  Result Value Ref Range Status   SARS Coronavirus 2 by RT PCR NEGATIVE NEGATIVE Final    Comment: (NOTE) SARS-CoV-2 target nucleic acids are NOT DETECTED. The SARS-CoV-2 RNA is generally detectable in upper respiratoy specimens during the acute phase of infection. The lowest concentration of SARS-CoV-2 viral copies this assay can detect is 131 copies/mL. A negative result does not preclude SARS-Cov-2 infection and should not be used as the sole basis for treatment or other patient management decisions. A negative result may occur with  improper specimen collection/handling, submission of specimen other than nasopharyngeal swab, presence of viral mutation(s) within the areas targeted by this assay, and inadequate number of viral copies (<131 copies/mL). A negative result must be combined with clinical observations, patient history, and epidemiological information. The expected result is Negative. Fact Sheet for Patients:  PinkCheek.be Fact Sheet for Healthcare Providers:  GravelBags.it This test is not yet ap proved or cleared by the [place] FDA and  has been authorized for detection and/or diagnosis of SARS-CoV-2 by FDA under an Emergency Use Authorization (EUA). This EUA will remain  in effect (meaning this test can be used) for the duration of the COVID-19 declaration under Section 564(b)(1) of the Act, 21 U.S.C. section 360bbb-3(b)(1), unless the authorization is terminated or revoked sooner.    Influenza A by PCR NEGATIVE NEGATIVE Final   Influenza B by PCR NEGATIVE NEGATIVE Final    Comment: (NOTE) The Xpert Xpress SARS-CoV-2/FLU/RSV assay is intended as an aid in  the diagnosis of influenza from Nasopharyngeal swab specimens and  should  not be used as a sole basis for treatment. Nasal washings and  aspirates are unacceptable for Xpert Xpress SARS-CoV-2/FLU/RSV  testing. Fact Sheet for Patients: PinkCheek.be Fact Sheet for Healthcare Providers: GravelBags.it This test is not yet approved or cleared by the [place] FDA and  has been authorized for detection and/or diagnosis of SARS-CoV-2 by  FDA under an Emergency Use Authorization (EUA). This EUA will remain  in effect (meaning this test can be used) for the duration of the  Covid-19 declaration under Section 564(b)(1) of the Act, 21  U.S.C. section 360bbb-3(b)(1), unless the authorization is  terminated or revoked. Performed at [facility], [address] [place], [place] [address]      Studies: DG Chest 1 View  Result Date: 05/20/2019 CLINICAL DATA:  Fever, altered mental status EXAM: CHEST  1 VIEW COMPARISON:  None. FINDINGS: The heart size  and mediastinal contours are within normal limits. Both lungs are clear. No pleural effusion or pneumothorax. The visualized skeletal structures are unremarkable. IMPRESSION: No acute process in the chest Electronically Signed   By: Macy Mis M.D.   On: 05/20/2019 10:57   CT HEAD WO CONTRAST  Result Date: 05/20/2019 CLINICAL DATA:  Patient with lymphoma undergoing treatment. Cephalopathy. EXAM: CT HEAD WITHOUT CONTRAST TECHNIQUE: Contiguous axial images were obtained from the base of the skull through the vertex without intravenous contrast. 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 Cerebral hemispheres show chronic small-vessel change of the white matter. No sign of acute infarction, mass lesion, hemorrhage, hydrocephalus or extra-axial collection. Vascular: There is atherosclerotic calcification of the major vessels at the base of the brain. Skull: Negative Sinuses/Orbits: Clear/normal Other: None  IMPRESSION: No acute finding. Chronic small-vessel change of the cerebral hemispheric white matter. Old small vessel cerebellar infarctions. Electronically Signed   By: Nelson Chimes M.D.   On: 05/20/2019 19:56    Scheduled Meds: . vitamin C  250 mg Oral Daily  . feeding supplement (NEPRO CARB STEADY)  237 mL Oral TID BM  . ferrous sulfate  325 mg Oral Daily  . heparin  5,000 Units Subcutaneous Q8H  . insulin aspart  0-5 Units Subcutaneous QHS  . insulin aspart  0-9 Units Subcutaneous TID WC  . [START ON 05/22/2019] multivitamin with minerals  1 tablet Oral Daily  . potassium chloride  40 mEq Oral Once  . pravastatin  40 mg Oral QHS   Continuous Infusions: . sodium chloride 50 mL/hr at 05/21/19 1104  . ceFEPime (MAXIPIME) IV 2 g (05/21/19 1107)  . vancomycin      Assessment/Plan:  1. Clinical sepsis with fever, tachycardia and tachypnea and elevated lactic acid and acute metabolic encephalopathy.  I do not have a clear source of infection at this time.  Patient on empiric vancomycin and cefepime.  Follow-up cultures.  Repeat a chest x-ray tomorrow after hydration.  CT scan possibly showing pneumonia.  Could also be tumor fever.  Case discussed with Dr. Rogue Bussing oncology to see. 2. Acute kidney injury on chronic kidney disease stage IIIa.  Continue IV fluids 3. 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  On sliding scale insulin here 4. Essential hypertension.  Holding amlodipine 5. Hyperlipidemia on pravastatin 6. Small cell B-cell lymphoma.  Case discussed with Dr. Rogue Bussing oncology to see the patient.  Wondering if his fever is actually tumor fever.  Patient has progressive lymphoma neck, chest and abdomen 7. CT scan commenting on hematoma in right upper extremity.  Recommended getting an ultrasound of the right upper extremity which I ordered. 8. Physical therapy evaluation 9. Severe protein calorie malnutrition  Code Status:     Code Status Orders  (From  admission, onward)         Start     Ordered   05/20/19 1443  Full code  Continuous     05/20/19 1443        Code Status History    Date Active Date Inactive Code Status Order ID Comments User Context   05/06/2019 2257 05/09/2019 1946 Full Code [id number]  Orene Desanctis, DO ED   12/26/2017 0031 12/27/2017 1908 Full Code [id number]  Lance Coon, MD ED   06/08/2011 1642 06/12/2011 1822 Full Code [id number]  Scarlette Calico, RN Inpatient   Advance Care Planning Activity     Family Communication: Wife at the bedside Disposition  Plan: Temperature curve will need to come down and patient will need to be doing physically better prior to going home.  We will get physical therapy evaluation also. Awaiting oncology opinion on whether this could be tumor fever.  Goal will to go home.  Likely will need another couple days of antibiotics.  Consultants:  Oncology  Antibiotics:  Vancomycin  Cefepime  Time spent: 28 minutes  [place]

## 2019-05-21 NOTE — Progress Notes (Signed)
   05/21/19 1450  Clinical Encounter Type  Visited With Patient and family together  Visit Type Initial;Spiritual support  Referral From Chaplain  Consult/Referral To Chaplain  While doing rounds Chaplain visited with patient and his wife. Chaplain explain that Chaplains are available 24 hrs for patient as well as family members and told Mrs. Hoyle Sauer to feel free to call us. When Chaplain mentioned prayer, Mrs. Hoyle Sauer mentioned they are Christians. Chaplain offered pastoral presence, empathy, and prayer.

## 2019-05-21 NOTE — Progress Notes (Signed)
Initial Nutrition Assessment  DOCUMENTATION CODES:   Severe malnutrition in context of chronic illness  INTERVENTION:   Nepro Shake po TID, each supplement provides 425 kcal and 19 grams protein  Magic cup TID with meals, each supplement provides 290 kcal and 9 grams of protein  MVI daily   Liberalize diet   Pt likely at high refeed risk; recommend monitor K, Mg and P labs daily as oral intake improves.   NUTRITION DIAGNOSIS:   Severe Malnutrition related to cancer and cancer related treatments as evidenced by 26 percent weight loss in 1 year, severe fat depletion, severe muscle depletion.  GOAL:   Patient will meet greater than or equal to 90% of their needs  MONITOR:   PO intake, Supplement acceptance, Labs, Weight trends, Skin, I & O's  REASON FOR ASSESSMENT:   Malnutrition Screening Tool    ASSESSMENT:   69 y.o. 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   Met with pt and pt's wife in room today. Pt unable to provide any meaningful history so history obtained from pt's wife at bedside. Wife reports pt with poor appetite and oral intake at baseline. Wife reports that pt did eat 2 hotdogs the other day but sometimes he will refuse to eat. Pt has not been drinking any supplements at home as wife was fearful of running up his blood sugar. Per chart, pt has lost 56lbs(26%) over the past year; this is significant weight loss. RD spoke with wife about ways to increase pt's calorie and protein intake. Recommended 3 supplements per day at home. RD will add supplements and vitamins to help pt meet his estimated needs. RD will also liberalize the heart healthy portion of pt's diet as this is restrictive of protein. Pt is likely at refeed risk.   Medications reviewed and include: vitamin C,  ferrous sulfate, heparin, insulin, KCl, NaCl '@50ml' /hr, cefepime, vancomycin   Labs reviewed: K 3.2(L), BUN 31(H), creat 1.84(H) Hgb 8.4(L), Hct 28.4(L), MCV 74.0(L), MCH 21.9(L), MCHC 29.6(L) cbgs- 134, 126 x 24 hrs AIC 7.3(H)- 7.3(H)- 3/3  NUTRITION - FOCUSED PHYSICAL EXAM:    Most Recent Value  Orbital Region  Moderate depletion  Upper Arm Region  Severe depletion  Thoracic and Lumbar Region  Severe depletion  Buccal Region  Moderate depletion  Temple Region  Severe depletion  Clavicle Bone Region  Severe depletion  Clavicle and Acromion Bone Region  Severe depletion  Scapular Bone Region  Moderate depletion  Dorsal Hand  Moderate depletion  Patellar Region  Severe depletion  Anterior Thigh Region  Severe depletion  Posterior Calf Region  Severe depletion  Edema (RD Assessment)  None  Hair  Reviewed  Eyes  Reviewed  Mouth  Reviewed  Skin  Reviewed  Nails  Reviewed     Diet Order:   Diet Order            Diet Carb Modified Fluid consistency: Thin; Room service appropriate? No  Diet effective now             EDUCATION NEEDS:   Education needs have been addressed(with family member at bedside)  Skin:  Skin Assessment: Reviewed RN Assessment(ecchymosis)  Last BM:  3/16  Height:   Ht Readings from Last 1 Encounters:  05/20/19 '6\' 1"'  (1.854 m)    Weight:   Wt Readings from Last 1 Encounters:  05/20/19 76.2 kg    Ideal Body Weight:  78.7 kg(adjusted for L BKA)  BMI:  Body mass index is 22.16 kg/m.  Estimated Nutritional Needs:   Kcal:  2200-2500kcal/day  Protein:  110-125g/day  Fluid:  >1.8L/day  Koleen Distance MS, RD, LDN Contact information available in Amion

## 2019-05-21 NOTE — Progress Notes (Signed)
CSW confirmed with Advanced Representative [name] that patient was active with them for Home Health PT.  Oleh Genin, [place]

## 2019-05-22 ENCOUNTER — Inpatient Hospital Stay: Payer: Medicare Other

## 2019-05-22 DIAGNOSIS — G9341 Metabolic encephalopathy: Secondary | ICD-10-CM

## 2019-05-22 DIAGNOSIS — C8308 Small cell B-cell lymphoma, lymph nodes of multiple sites: Secondary | ICD-10-CM

## 2019-05-22 LAB — CBC
HCT: 31.6 % — ABNORMAL LOW (ref 39.0–52.0)
Hemoglobin: 9.3 g/dL — ABNORMAL LOW (ref 13.0–17.0)
MCH: 21.6 pg — ABNORMAL LOW (ref 26.0–34.0)
MCHC: 29.4 g/dL — ABNORMAL LOW (ref 30.0–36.0)
MCV: 73.5 fL — ABNORMAL LOW (ref 80.0–100.0)
Platelets: 270 10*3/uL (ref 150–400)
RBC: 4.3 MIL/uL (ref 4.22–5.81)
RDW: 18.9 % — ABNORMAL HIGH (ref 11.5–15.5)
WBC: 7.1 10*3/uL (ref 4.0–10.5)
nRBC: 0 % (ref 0.0–0.2)

## 2019-05-22 LAB — BASIC METABOLIC PANEL
Anion gap: 12 (ref 5–15)
BUN: 33 mg/dL — ABNORMAL HIGH (ref 8–23)
CO2: 20 mmol/L — ABNORMAL LOW (ref 22–32)
Calcium: 9.3 mg/dL (ref 8.9–10.3)
Chloride: 105 mmol/L (ref 98–111)
Creatinine, Ser: 2.03 mg/dL — ABNORMAL HIGH (ref 0.61–1.24)
GFR calc Af Amer: 38 mL/min — ABNORMAL LOW (ref >60)
GFR calc non Af Amer: 33 mL/min — ABNORMAL LOW (ref >60)
Glucose, Bld: 160 mg/dL — ABNORMAL HIGH (ref 70–99)
Potassium: 3.3 mmol/L — ABNORMAL LOW (ref 3.5–5.1)
Sodium: 137 mmol/L (ref 135–145)

## 2019-05-22 LAB — GLUCOSE, CAPILLARY
Glucose-Capillary: 168 mg/dL — ABNORMAL HIGH (ref 70–99)
Glucose-Capillary: 153 mg/dL — ABNORMAL HIGH (ref 70–99)
Glucose-Capillary: 342 mg/dL — ABNORMAL HIGH (ref 70–99)
Glucose-Capillary: 220 mg/dL — ABNORMAL HIGH (ref 70–99)
Glucose-Capillary: 129 mg/dL — ABNORMAL HIGH (ref 70–99)

## 2019-05-22 MED ORDER — POTASSIUM CHLORIDE CRYS ER 20 MEQ PO TBCR
40.0000 meq | EXTENDED_RELEASE_TABLET | Freq: Once | ORAL | Status: AC
  Administered 2019-05-22: 40 meq via ORAL
  Filled 2019-05-22: qty 2

## 2019-05-22 MED ORDER — METRONIDAZOLE 500 MG PO TABS
500.0000 mg | ORAL_TABLET | Freq: Three times a day (TID) | ORAL | Status: DC
  Administered 2019-05-22 – 2019-05-24 (×7): 500 mg via ORAL
  Filled 2019-05-22 (×9): qty 1

## 2019-05-22 NOTE — Progress Notes (Signed)
Patient ID: Michoel Hohimer, male   DOB: 1950-05-15, 69 y.o.   MRN: [id number] [facility] PROGRESS NOTE  Bear Gala [id number] DOB: 05-30-1950 DOA: 05/20/2019 PCP: [name], MD  HPI/Subjective: Patient awakened from sleep.  He was not very happy that I woke him up.  Dr. Rogue Bussing felt that he was confused on his evaluation, so I went in there to see him.  Patient was more upset that I woke him up.  Objective: Vitals:   05/22/19 0136 05/22/19 0620  BP:  (!) 141/67  Pulse:  90  Resp:  20  Temp: (!) 102.5 F (39.2 C) (!) 97.5 F (36.4 C)  SpO2:      Intake/Output Summary (Last 24 hours) at 05/22/2019 1221 Last data filed at 05/22/2019 0730 Gross per 24 hour  Intake 1485.78 ml  Output 900 ml  Net 585.78 ml   Filed Weights   05/20/19 1015  Weight: 76.2 kg    ROS: Review of Systems  Unable to perform ROS: Other  Hard of hearing Exam: Physical Exam  HENT:  Nose: No mucosal edema.  Mouth/Throat: No oropharyngeal exudate or posterior oropharyngeal edema.  Eyes: Pupils are equal, round, and reactive to light. Conjunctivae and lids are normal.  Cardiovascular: S1 normal and S2 normal. Exam reveals no gallop.  No murmur heard. Respiratory: No respiratory distress. He has no wheezes. He has no rhonchi. He has no rales.  GI: Soft. Bowel sounds are normal. There is no abdominal tenderness.  Musculoskeletal:     Left knee: No swelling.     Right ankle: No swelling.  Lymphadenopathy:    He has no cervical adenopathy.  Neurological: He is alert.  Skin: Skin is warm. No rash noted. Nails show no clubbing.  Psychiatric:  Upset that I woke him up      Data Reviewed: Basic Metabolic Panel: Recent Labs  Lab 05/20/19 1033 05/21/19 0528 05/22/19 0653  NA 135 137 137  K 4.1 3.2* 3.3*  CL 101 104 105  CO2 24 26 20*  GLUCOSE 177* 142* 160*  BUN 32* 31* 33*  CREATININE 1.84* 1.84* 2.03*  CALCIUM 9.7 8.9 9.3   Liver Function Tests: Recent  Labs  Lab 05/20/19 1033  AST 17  ALT 12  ALKPHOS 55  BILITOT 0.5  PROT 6.7  ALBUMIN 3.1*   CBC: Recent Labs  Lab 05/20/19 1033 05/21/19 0528 05/22/19 0653  WBC 9.6 8.3 7.1  NEUTROABS 7.0  --   --   HGB 9.7* 8.4* 9.3*  HCT 33.2* 28.4* 31.6*  MCV 75.1* 74.0* 73.5*  PLT 333 310 270   BNP (last 3 results) Recent Labs    05/20/19 1315  BNP 90.0     CBG: Recent Labs  Lab 05/21/19 0803 05/21/19 1130 05/21/19 1818 05/21/19 2121 05/22/19 0741  GLUCAP 126* 183* 130* 168* 153*    Recent Results (from the past 240 hour(s))  Blood culture (routine x 2)     Status: None (Preliminary result)   Collection Time: 05/20/19 10:33 AM   Specimen: BLOOD  Result Value Ref Range Status   Specimen Description BLOOD LEFT AC  Final   Special Requests   Final    BOTTLES DRAWN AEROBIC AND ANAEROBIC Blood Culture results may not be optimal due to an inadequate volume of blood received in culture bottles   Culture   Final    NO GROWTH 2 DAYS Performed at [facility], [address] [place],  [address]  Report Status PENDING  Incomplete  Urine Culture     Status: None   Collection Time: 05/20/19 10:33 AM   Specimen: Urine, Random  Result Value Ref Range Status   Specimen Description   Final    URINE, RANDOM Performed at [facility], [address], [place], [place] [address]    Special Requests   Final    NONE Performed at [facility], [address] [place], [place] [address]    Culture   Final    NO GROWTH Performed at [facility], [place] [address], [place], [place] [address]    Report Status 05/21/2019 FINAL  Final  Respiratory Panel by RT PCR (Flu A&B, Covid) - Nasopharyngeal Swab     Status: None   Collection Time: 05/20/19  1:21 PM   Specimen: Nasopharyngeal Swab  Result Value Ref Range Status   SARS Coronavirus 2 by RT PCR NEGATIVE NEGATIVE Final    Comment: (NOTE) SARS-CoV-2 target nucleic acids are NOT  DETECTED. The SARS-CoV-2 RNA is generally detectable in upper respiratoy specimens during the acute phase of infection. The lowest concentration of SARS-CoV-2 viral copies this assay can detect is 131 copies/mL. A negative result does not preclude SARS-Cov-2 infection and should not be used as the sole basis for treatment or other patient management decisions. A negative result may occur with  improper specimen collection/handling, submission of specimen other than nasopharyngeal swab, presence of viral mutation(s) within the areas targeted by this assay, and inadequate number of viral copies (<131 copies/mL). A negative result must be combined with clinical observations, patient history, and epidemiological information. The expected result is Negative. Fact Sheet for Patients:  PinkCheek.be Fact Sheet for Healthcare Providers:  GravelBags.it This test is not yet ap proved or cleared by the [place] FDA and  has been authorized for detection and/or diagnosis of SARS-CoV-2 by FDA under an Emergency Use Authorization (EUA). This EUA will remain  in effect (meaning this test can be used) for the duration of the COVID-19 declaration under Section 564(b)(1) of the Act, 21 U.S.C. section 360bbb-3(b)(1), unless the authorization is terminated or revoked sooner.    Influenza A by PCR NEGATIVE NEGATIVE Final   Influenza B by PCR NEGATIVE NEGATIVE Final    Comment: (NOTE) The Xpert Xpress SARS-CoV-2/FLU/RSV assay is intended as an aid in  the diagnosis of influenza from Nasopharyngeal swab specimens and  should not be used as a sole basis for treatment. Nasal washings and  aspirates are unacceptable for Xpert Xpress SARS-CoV-2/FLU/RSV  testing. Fact Sheet for Patients: PinkCheek.be Fact Sheet for Healthcare Providers: GravelBags.it This test is not yet approved or cleared  by the [place] FDA and  has been authorized for detection and/or diagnosis of SARS-CoV-2 by  FDA under an Emergency Use Authorization (EUA). This EUA will remain  in effect (meaning this test can be used) for the duration of the  Covid-19 declaration under Section 564(b)(1) of the Act, 21  U.S.C. section 360bbb-3(b)(1), unless the authorization is  terminated or revoked. Performed at [facility], [address], [place], [place] [address]      Studies: DG Chest 2 View  Result Date: 05/22/2019 CLINICAL DATA:  Fever EXAM: CHEST - 2 VIEW COMPARISON:  May 20, 2019 FINDINGS: Lungs are clear. Heart size and pulmonary vascularity are normal. No adenopathy. There is degenerative change in the lower thoracic spine. There is a skin fold on the right. No appreciable pneumothorax. IMPRESSION: Lungs clear. Cardiac silhouette  within normal limits. Apparent skin fold on the right. Electronically Signed   By: Lowella Grip III M.D.   On: 05/22/2019 08:43   CT HEAD WO CONTRAST  Result Date: 05/20/2019 CLINICAL DATA:  Patient with lymphoma undergoing treatment. Cephalopathy. EXAM: CT HEAD WITHOUT CONTRAST TECHNIQUE: Contiguous axial images were obtained from the base of the skull through the vertex without intravenous contrast. 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 Cerebral hemispheres show chronic small-vessel change of the white matter. No sign of acute infarction, mass lesion, hemorrhage, hydrocephalus or extra-axial collection. Vascular: There is atherosclerotic calcification of the major vessels at the base of the brain. Skull: Negative Sinuses/Orbits: Clear/normal Other: None IMPRESSION: No acute finding. Chronic small-vessel change of the cerebral hemispheric white matter. Old small vessel cerebellar infarctions. Electronically Signed   By: Nelson Chimes M.D.   On: 05/20/2019 19:56   US Venous Img Upper Uni Right(DVT)  Result  Date: 05/21/2019 CLINICAL DATA:  69 year old with right upper extremity swelling. EXAM: RIGHT UPPER EXTREMITY VENOUS DOPPLER ULTRASOUND TECHNIQUE: Gray-scale sonography with graded compression, as well as color Doppler and duplex ultrasound were performed to evaluate the upper extremity deep venous system from the level of the subclavian vein and including the jugular, axillary, basilic, radial, ulnar and upper cephalic vein. Spectral Doppler was utilized to evaluate flow at rest and with distal augmentation maneuvers. COMPARISON:  None. FINDINGS: Contralateral Subclavian Vein: Normal color Doppler flow and phasicity. Internal Jugular Vein: No evidence of thrombus. Normal compressibility, color Doppler flow and phasicity. Subclavian Vein: No evidence of thrombus. Normal color Doppler flow and phasicity. Axillary Vein: No evidence of thrombus. Normal compressibility, color Doppler flow and augmentation. Cephalic Vein: No evidence of thrombus. Normal compressibility, color Doppler flow and augmentation. Basilic Vein: No evidence of thrombus. Normal compressibility, color Doppler flow and augmentation. Brachial Veins: No evidence of thrombus. Normal compressibility, color Doppler flow and augmentation. Radial Veins: No evidence of thrombus. Normal compressibility and color Doppler flow. Ulnar Veins: No evidence of thrombus. Normal compressibility and color Doppler flow. Other Findings: No discrete abnormality at the area of concern in the right forearm. IMPRESSION: No evidence of DVT within the right upper extremity. Electronically Signed   By: Markus Daft M.D.   On: 05/21/2019 20:46    Scheduled Meds: . vitamin C  250 mg Oral Daily  . feeding supplement (NEPRO CARB STEADY)  237 mL Oral TID BM  . ferrous sulfate  325 mg Oral Daily  . heparin  5,000 Units Subcutaneous Q8H  . insulin aspart  0-5 Units Subcutaneous QHS  . insulin aspart  0-9 Units Subcutaneous TID WC  . multivitamin with minerals  1 tablet Oral  Daily  . potassium chloride  40 mEq Oral Once  . pravastatin  40 mg Oral QHS   Continuous Infusions: . sodium chloride 75 mL/hr at 05/22/19 1113  . ceFEPime (MAXIPIME) IV 2 g (05/22/19 0949)  . vancomycin Stopped (05/21/19 1524)    Assessment/Plan:  1. Clinical sepsis with fever, tachycardia and tachypnea and elevated lactic acid and acute metabolic encephalopathy.  I do not have a clear source of infection at this time.  Patient on empiric vancomycin and cefepime.  So far cultures are negative.  We will get infectious disease consultation and opinion.  Could be tumor fever.  Had another temperature of 102 early morning today. 2. Acute kidney injury on chronic kidney disease stage IIIa.  Increase rate of IV fluids to 75 cc/h since  creatinine increased up to 2.0 3. 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  On sliding scale insulin here. 4. Essential hypertension.  Holding amlodipine 5. Hyperlipidemia on pravastatin 6. Small cell B-cell lymphoma.  Case discussed with Dr. Rogue Bussing oncology to see the patient.  Wondering if his fever is actually tumor fever.  Dr. Rogue Bussing was going to look into see if they can do a PET CT scan on him as inpatient. 7. CT scan commenting on hematoma in right upper extremity.  Ultrasound negative for DVT 8. Physical therapy evaluation.  Patient did well with physical therapy today and recommended home with home health 9. Severe protein calorie malnutrition 10. Anemia.  Patient has history of blood loss but also has chronic disease.  Hemoglobin actually came up today at 9.3.  Of note, the patient does better when the patient's wife is here.  I will wait to see him tomorrow when she is present.  Code Status:     Code Status Orders  (From admission, onward)         Start     Ordered   05/20/19 1443  Full code  Continuous     05/20/19 1443        Code Status History    Date Active Date Inactive Code Status Order ID Comments User  Context   05/06/2019 2257 05/09/2019 1946 Full Code [id number]  Orene Desanctis, DO ED   12/26/2017 0031 12/27/2017 1908 Full Code [id number]  Lance Coon, MD ED   06/08/2011 1642 06/12/2011 1822 Full Code [id number]  Scarlette Calico, RN Inpatient   Advance Care Planning Activity     Family Communication: Wife on the phone Disposition Plan: Since the patient had a temperature of 102 this morning would definitely like to see temperature curve come down prior to disposition.  Infectious disease consultation.  Could also be potential tumor fever.   Consultants:  Oncology  Antibiotics:  Vancomycin  Cefepime  Time spent: 27 minutes  [place]

## 2019-05-22 NOTE — Consult Note (Signed)
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 size, without focal abnormality. Adrenals/Urinary Tract: Normal adrenal glands. Favor renal vascular calcifications over renal calculi. No hydronephrosis. Bladder wall thickening is mild, at least partially felt to be due to underdistention. Stomach/Bowel: Grossly normal stomach. Colonic stool burden suggests constipation.  Normal small bowel caliber. Vascular/Lymphatic: Aortic atherosclerosis. Bulky abdominal adenopathy. Index left periaortic node measures 2.7 x 3.6 cm on 77/2 versus 2.2 x 2.8 cm at the same level on the prior. A dominant left periaortic nodal mass measures maximally 6.7 cm on 93/2 today versus 5.5 cm on the prior exam (when remeasured). Index left external iliac node measures 3.3 cm on 116/2 and is similar to on the prior exam (when remeasured). Reproductive: Moderate prostatomegaly. Other: No significant free fluid. Musculoskeletal: No acute osseous abnormality. Lumbosacral junction degenerative disc disease. IMPRESSION: 1. Multifactorial degradation, including lack of IV contrast, motion, patient arm position. 2. Since 01/24/2019, development of left lower lobe nodular airspace disease, suspicious for infection or aspiration. 3. Right upper extremity edema and gas with possible intramuscular hematoma. Correlate with recent trauma or attempted IV placement. If no correlate history, deep venous thrombosis of the right upper extremity to would be a consideration and ultrasound suggested. 4. Progressive lymphoma within the neck, chest, abdomen, as detailed above. Pelvic adenopathy is relatively similar. 5. Prostatomegaly. Apparent bladder wall thickening is at least partially felt to be due to underdistention. Correlate for possible bladder outlet obstruction. 6. Coronary artery atherosclerosis. Aortic Atherosclerosis (ICD10-I70.0). 7.  Possible constipation. Electronically Signed   By: Abigail Miyamoto M.D.   On: 05/07/2019 16:57   DG Chest 1 View  Result Date: 05/20/2019 CLINICAL DATA:  Fever, altered mental status EXAM: CHEST  1 VIEW COMPARISON:  None. FINDINGS: The heart size and mediastinal contours are within normal limits. Both lungs are clear. No pleural effusion or pneumothorax. The visualized skeletal structures  are unremarkable. IMPRESSION: No acute process in the chest Electronically Signed   By: Macy Mis M.D.   On: 05/20/2019 10:57   DG Chest 2 View  Result Date: 05/22/2019 CLINICAL DATA:  Fever EXAM: CHEST - 2 VIEW COMPARISON:  May 20, 2019 FINDINGS: Lungs are clear. Heart size and pulmonary vascularity are normal. No adenopathy. There is degenerative change in the lower thoracic spine. There is a skin fold on the right. No appreciable pneumothorax. IMPRESSION: Lungs clear. Cardiac silhouette within normal limits. Apparent skin fold on the right. Electronically Signed   By: Lowella Grip III M.D.   On: 05/22/2019 08:43   CT HEAD WO CONTRAST  Result Date: 05/20/2019 CLINICAL DATA:  Patient with lymphoma undergoing treatment. Cephalopathy. EXAM: CT HEAD WITHOUT CONTRAST TECHNIQUE: Contiguous axial images were obtained from the base of the skull through the vertex without intravenous contrast. 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 Cerebral hemispheres show chronic small-vessel change of the white matter. No sign of acute infarction, mass lesion, hemorrhage, hydrocephalus or extra-axial collection. Vascular: There is atherosclerotic calcification of the major vessels at the base of the brain. Skull: Negative Sinuses/Orbits: Clear/normal Other: None IMPRESSION: No acute finding. Chronic small-vessel change of the cerebral hemispheric white matter. Old small vessel cerebellar infarctions. Electronically Signed   By: Nelson Chimes M.D.   On: 05/20/2019 19:56   CT Head Wo Contrast  Result Date: 05/06/2019 CLINICAL DATA:  Altered mental status, unclear cause EXAM: CT HEAD WITHOUT CONTRAST TECHNIQUE: Contiguous axial images were obtained from the base of the skull through the vertex without intravenous contrast. COMPARISON:  None. 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 No CT evident large vascular territory or cortically based infarction is seen. No evidence of acute, hemorrhage,  hydrocephalus, extra-axial collection or mass lesion/mass effect. Patchy areas of white matter hypoattenuation are most compatible with chronic microvascular angiopathy. Symmetric prominence of the ventricles, cisterns and sulci compatible with parenchymal volume loss. Vascular: Atherosclerotic calcification of the carotid siphons and intradural vertebral arteries. No hyperdense vessel. Skull: No calvarial fracture or suspicious osseous lesion. No scalp swelling or hematoma. Vascular calcium of the scalp, often seen in diabetes. Sinuses/Orbits: Paranasal sinuses and mastoid air cells are predominantly clear. Included orbital structures are unremarkable. Other: None IMPRESSION: 1. Multiple acquisition required due to patient motion artifact. 2. No convincing CT evidence of acute intracranial abnormality. 3. 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 4. Chronic microangiopathic changes with parenchymal volume loss. Intracranial atherosclerosis. Electronically Signed   By: Lovena Le M.D.   On: 05/06/2019 21:15   CT CHEST WO CONTRAST  Result Date: 05/07/2019 CLINICAL DATA:  Hyperparathyroidism. Hypercalcemia. Small B-cell lymphoma, on immune therapy. Pulmonary emboli in September. Altered mental status and fever. EXAM: CT CHEST, ABDOMEN AND PELVIS WITHOUT CONTRAST TECHNIQUE: Multidetector CT imaging of the chest, abdomen and pelvis was performed following the standard protocol without IV contrast. COMPARISON:  01/24/2019 FINDINGS: CT CHEST FINDINGS Cardiovascular: Multifactorial degradation, including motion in the lower chest, patient arm position (not raised above the head), and lack of IV contrast. Aortic and branch vessel atherosclerosis. Tortuous thoracic aorta. Mild cardiomegaly. Multivessel coronary artery atherosclerosis. Mediastinum/Nodes: Left supraclavicular index node measures 2.3 cm on 07/02, increased from 1.3 cm on the prior. Low left jugular nodes measure up to 1.8  cm today versus 1.4 cm on the prior. Relatively similar multiple bilateral axillary nodes. Left paratracheal node measures 1.2  cm on 22/2, newly enlarged since the prior. Hilar regions poorly evaluated without intravenous contrast. Lungs/Pleura: No pleural fluid. Right greater than left subpleural pulmonary nodules are similar, likely subpleural lymph nodes. Example at up to 6 mm on 72/4 in the right upper lobe. New superior segment left lower lobe clustered nodular airspace and ground-glass opacity, including on 74/4. Musculoskeletal: Right upper extremity edema and subcutaneous gas are incompletely imaged, including on 38/2. Question intramuscular hematoma, including on 44/2. No acute osseous abnormality. CT ABDOMEN PELVIS FINDINGS Hepatobiliary: Multifactorial degradation continuing into the upper abdomen. Grossly normal noncontrast appearance of the liver, gallbladder. Pancreas: Grossly normal pancreas. Spleen: Normal in size, without focal abnormality. Adrenals/Urinary Tract: Normal adrenal glands. Favor renal vascular calcifications over renal calculi. No hydronephrosis. Bladder wall thickening is mild, at least partially felt to be due to underdistention. Stomach/Bowel: Grossly normal stomach. Colonic stool burden suggests constipation. Normal small bowel caliber. Vascular/Lymphatic: Aortic atherosclerosis. Bulky abdominal adenopathy. Index left periaortic node measures 2.7 x 3.6 cm on 77/2 versus 2.2 x 2.8 cm at the same level on the prior. A dominant left periaortic nodal mass measures maximally 6.7 cm on 93/2 today versus 5.5 cm on the prior exam (when remeasured). Index left external iliac node measures 3.3 cm on 116/2 and is similar to on the prior exam (when remeasured). Reproductive: Moderate prostatomegaly. Other: No significant free fluid. Musculoskeletal: No acute osseous abnormality. Lumbosacral junction degenerative disc disease. IMPRESSION: 1. Multifactorial degradation, including lack of IV  contrast, motion, patient arm position. 2. Since 01/24/2019, development of left lower lobe nodular airspace disease, suspicious for infection or aspiration. 3. Right upper extremity edema and gas with possible intramuscular hematoma. Correlate with recent trauma or attempted IV placement. If no correlate history, deep venous thrombosis of the right upper extremity to would be a consideration and ultrasound suggested. 4. Progressive lymphoma within the neck, chest, abdomen, as detailed above. Pelvic adenopathy is relatively similar. 5. Prostatomegaly. Apparent bladder wall thickening is at least partially felt to be due to underdistention. Correlate for possible bladder outlet obstruction. 6. Coronary artery atherosclerosis. Aortic Atherosclerosis (ICD10-I70.0). 7.  Possible constipation. Electronically Signed   By: Abigail Miyamoto M.D.   On: 05/07/2019 16:57   US Venous Img Upper Uni Right(DVT)  Result Date: 05/21/2019 CLINICAL DATA:  69 year old with right upper extremity swelling. EXAM: RIGHT UPPER EXTREMITY VENOUS DOPPLER ULTRASOUND TECHNIQUE: Gray-scale sonography with graded compression, as well as color Doppler and duplex ultrasound were performed to evaluate the upper extremity deep venous system from the level of the subclavian vein and including the jugular, axillary, basilic, radial, ulnar and upper cephalic vein. Spectral Doppler was utilized to evaluate flow at rest and with distal augmentation maneuvers. COMPARISON:  None. FINDINGS: Contralateral Subclavian Vein: Normal color Doppler flow and phasicity. Internal Jugular Vein: No evidence of thrombus. Normal compressibility, color Doppler flow and phasicity. Subclavian Vein: No evidence of thrombus. Normal color Doppler flow and phasicity. Axillary Vein: No evidence of thrombus. Normal compressibility, color Doppler flow and augmentation. Cephalic Vein: No evidence of thrombus. Normal compressibility, color Doppler flow and augmentation. Basilic Vein:  No evidence of thrombus. Normal compressibility, color Doppler flow and augmentation. Brachial Veins: No evidence of thrombus. Normal compressibility, color Doppler flow and augmentation. Radial Veins: No evidence of thrombus. Normal compressibility and color Doppler flow. Ulnar Veins: No evidence of thrombus. Normal compressibility and color Doppler flow. Other Findings: No discrete abnormality at the area of concern in the right forearm. IMPRESSION: No evidence of DVT within the right  upper extremity. Electronically Signed   By: Markus Daft M.D.   On: 05/21/2019 20:46   DG Chest Portable 1 View  Result Date: 05/06/2019 CLINICAL DATA:  Fevers and altered mental status, history of lymphoma EXAM: PORTABLE CHEST 1 VIEW COMPARISON:  01/24/2019 CT, plain film from 11/13/2017 FINDINGS: Cardiac shadow is within normal limits. The lungs are well aerated bilaterally. No focal infiltrate or sizable effusion is seen. Aortic calcifications are noted. No acute bony abnormality is noted. Exuberant calcification at the first costochondral margins is noted bilaterally. IMPRESSION: No acute abnormality noted. Electronically Signed   By: Inez Catalina M.D.   On: 05/06/2019 22:18   IMPRESSION: 1. Multifactorial degradation, including lack of IV contrast, motion, patient arm position. 2. Since 01/24/2019, development of left lower lobe nodular airspace disease, suspicious for infection or aspiration. 3. Right upper extremity edema and gas with possible intramuscular hematoma. Correlate with recent trauma or attempted IV placement. If no correlate history, deep venous thrombosis of the right upper extremity to would be a consideration and ultrasound suggested. 4. Progressive lymphoma within the neck, chest, abdomen, as detailed above. Pelvic adenopathy is relatively similar. 5. Prostatomegaly. Apparent bladder wall thickening is at least partially felt to be due to underdistention. Correlate for possible bladder outlet  obstruction. 6. Coronary artery atherosclerosis. Aortic Atherosclerosis (ICD10-I70.0). 7.  Possible constipation.  Assessment:   [name] is a 69 y.o. 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  He is now admitted with recurrent persistent fevers.  Is a difficult patient to get a history from her exam and due to both hard of hearing and being very uncooperative which his wife says is normal for him.  It is very difficult to assess how confused he is but his wife does report he has been a little confused and this has been progressive over the last several weeks. There is a broad differential for his confusion and fevers.  Meningitis or encephalitis is a possibility but seems less likely.  Fungal infection is certainly a possibility.  CMV reactivation could also cause persistent fevers. Recommendations At this point I would consider treating him for possible aspiration again with the current cefepime and adding back Flagyl. He does not have any skin or soft tissue infections, blood cultures are negative and he has no ports or lines in place so I would DC vancomycin.   I have ordered testing for crypto antigen, galactomannan, fungi tell on the serum.  I have ordered she is to urinary antigen. CMV PCR ordered on serum. If he becomes more confused consider lumbar puncture but patient's wife and patient do not sound like they would be very agreeable to this at this point. If he worsens mentally or fevers persist I would consider a lumbar puncture.  Thank you very much for allowing me to participate in the care of this patient. Please call with questions.   Cheral Marker. Ola Spurr, MD

## 2019-05-22 NOTE — Assessment & Plan Note (Addendum)
#   69 year old male patient with history of small lymphocytic lymphoma/CLL is currently admitted to hospital for fevers/mental status changes  # Small lymphocytic lymphoma/CLL-recently noted to have progression of disease based on imaging in March.  Given the recent episode of hypercalcemia/fevers-concerning for transformation.  PET scan this morning  #Fevers/mental status changes-unclear etiology.  Infectious versus chemo fevers.  Discussed with Dr. Ola Spurr.   #CKD-AKI prerenal-overall stable monitor closely; recent hypercalcemia-s/p bisphosphonate improved  #Anemia iron deficiency-hemoglobin stable;[declined GI evaluation] monitor closely.   #History of PE-on Eliquis; discontinued in February 2020 because of iron deficiency anemia.  Monitor closely  # Addendum #1: I do long discussion with patient's wife regarding the concerning for progressive disease/aggressive/transformation-high possibility.  Discussed that treatment patients life expectancy would be weeks to months [based hypercalcemia/worsening renal function].  However if is treated-median survival of few years [again based upon response].  Patient's wife is also frustrated with patient's stubbornness/noncompliance.  Obviously patient's mental status changes have made the situation more complicated and difficult for the wife.  Patient wants to go home; however patient's is concerned that she is unable to care of him at home.  Discussed regarding palliative care evaluation.  She is in agreement.  However if patient is clinically stable-patient will be discharged home from oncology standpoint.  Will make follow-up appointments in the clinic early next week to discuss treatment options.  Discussed with Josh.  #Addendum #2 PET scan reviewed-progression of disease noted as suspected on the CT scan-SUV in the range of 10.  Unclear of transformation.  In the ideal situation would recommend biopsy; but given patient's general  reluctance/noncompliance a biopsy will be difficult to offer. Will order 2 D echo for anticipation of chemo as out pt.  This will be need to be discussed.   # 35 minutes face-to-face with the patient/wife discussing the above plan of care; more than 50% of time spent on prognosis/ natural history; counseling and coordination.

## 2019-05-22 NOTE — Consult Note (Signed)
[facility] [place] NOTE  Patient Care Team: [name], MD as PCP - General (Internal Medicine)  CHIEF COMPLAINTS/PURPOSE OF CONSULTATION: CLL/fevers  HISTORY OF PRESENTING ILLNESS: Patient is extremely hard of hearing at baseline.   [name] 69 y.o.  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  Patient has been on ibrutinib since October 2019-no recently known to have progression of disease on CT scan in March 2021.  Patient noted to have progressive disease in the retroperitoneal adenopathy.  Patient has been awaiting to get a PET scan for further evaluation to rule out Richter's transformation.  However patient was recently admitted to hospital for fevers-treated with antibiotics for possible aspiration pneumonia.  On a recent visit to the cancer center patient noted to have fevers/confusion-needs admitted to the hospital again for further work-up.  Patient is currently on antibiotics broad-spectrum-notes to have continued fever.  Oncology is been consulted for further evaluation for tumor fevers.  Patient resting comfortably.  However is confused.  Review of Systems  Unable to perform ROS: Mental acuity     MEDICAL HISTORY:  Past Medical History:  Diagnosis Date  . Anxiety   . Arthritis   . Cancer ([place])   . 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 HOH (hard of hearing)   . Hypertension   . Osteomyelitis of toe ([place]) 06/08/11   right foot  . Seasonal allergies   . Type II diabetes mellitus ([place])    diet controlled    SURGICAL HISTORY: Past Surgical History:  Procedure Laterality Date  . AMPUTATION  06/08/2011   Procedure: AMPUTATION RAY;  Surgeon: Wylene Simmer, MD;  Location: [facility] [facility];  Service: Orthopedics;  Laterality: Right;  Righ Hallux Amputation  .  [place]   "crushed"  . FRACTURE SURGERY    . 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 PILONIDAL CYST / SINUS EXCISION  1970's  . TOE AMPUTATION  06/08/11   partial; right great toe    SOCIAL HISTORY: Social History   Socioeconomic History  . Marital status: Married    Spouse name: Not on file  . Number of children: Not on file  . Years of education: Not on file  . Highest education level: Not on file  Occupational History  . Not on file  Tobacco Use  . Smoking status: Former Smoker    Packs/day: 1.50    Years: 22.00    Pack years: 33.00    Types: Cigarettes    Quit date: 05/24/2011    Years since quitting: 8.0  . Smokeless tobacco: Former Systems developer    Types: [place] date: 03/07/1983  Substance and Sexual Activity  . Alcohol use: Yes    Comment: 06/08/11 "no liquor for 2 1/2 years; last beer 05/31/11"  . Drug use: No  . Sexual activity: Yes  Other Topics Concern  . Not on file  Social History Narrative   Married   Social Determinants of Health   Financial Resource Strain:   . Difficulty of Paying Living Expenses:   Food Insecurity:   . Worried About Charity fundraiser in the Last Year:   . [place] in the Last Year:   Transportation Needs:   . Lack of  Transportation (Medical):   Marland Kitchen Lack of Transportation (Non-Medical):   Physical Activity:   . Days of Exercise per Week:   . Minutes of Exercise per Session:   Stress:   . Feeling of Stress :   Social Connections:   . Frequency of Communication with Friends and Family:   . Frequency of Social Gatherings with Friends and Family:   . Attends Religious Services:   . Active Member of Clubs or Organizations:   . Attends Archivist Meetings:   Marland Kitchen Marital Status:   Intimate Partner Violence:   . Fear of Current or Ex-Partner:   . Emotionally Abused:   Marland Kitchen Physically Abused:   . Sexually Abused:     FAMILY HISTORY: Family History  Problem Relation Age of Onset  . Prostate  cancer Father   . Hypertension Father   . Other Mother        VARICOSE VEINS    ALLERGIES:  is allergic to penicillins.  MEDICATIONS:  Current Facility-Administered Medications  Medication Dose Route Frequency Provider Last Rate Last Admin  . 0.9 %  sodium chloride infusion   Intravenous Continuous Loletha Grayer, MD 75 mL/hr at 05/22/19 1113 Rate Change at 05/22/19 1113  . acetaminophen (TYLENOL) tablet 650 mg  650 mg Oral Q6H PRN Ivor Costa, MD   650 mg at 05/22/19 0147  . ascorbic acid (VITAMIN C) tablet 250 mg  250 mg Oral Daily Lu Duffel, RPH   250 mg at 05/22/19 [id number]  . ceFEPIme (MAXIPIME) 2 g in sodium chloride 0.9 % 100 mL IVPB  2 g Intravenous Q12H Lu Duffel, RPH 200 mL/hr at 05/22/19 0949 2 g at 05/22/19 0949  . feeding supplement (NEPRO CARB STEADY) liquid 237 mL  237 mL Oral TID BM Loletha Grayer, MD   237 mL at 05/22/19 0955  . ferrous sulfate tablet 325 mg  325 mg Oral Daily Ivor Costa, MD   325 mg at 05/22/19 0955  . fluticasone (FLONASE) 50 MCG/ACT nasal spray 2 spray  2 spray Each Nare Daily PRN Ivor Costa, MD      . heparin injection 5,000 Units  5,000 Units Subcutaneous Q8H Ivor Costa, MD   5,000 Units at 05/22/19 1500  . hydrALAZINE (APRESOLINE) tablet 25 mg  25 mg Oral TID PRN Ivor Costa, MD      . insulin aspart (novoLOG) injection 0-5 Units  0-5 Units Subcutaneous QHS Ivor Costa, MD      . insulin aspart (novoLOG) injection 0-9 Units  0-9 Units Subcutaneous TID WC Ivor Costa, MD   3 Units at 05/22/19 1840  . metroNIDAZOLE (FLAGYL) tablet 500 mg  500 mg Oral Q8H Leonel Ramsay, MD   500 mg at 05/22/19 1515  . multivitamin with minerals tablet 1 tablet  1 tablet Oral Daily Loletha Grayer, MD   1 tablet at 05/22/19 0955  . ondansetron (ZOFRAN) injection 4 mg  4 mg Intravenous Q8H PRN Ivor Costa, MD      . pravastatin (PRAVACHOL) tablet 40 mg  40 mg Oral QHS Ivor Costa, MD   40 mg at 05/21/19 2111      .  PHYSICAL  EXAMINATION:  Vitals:   05/22/19 0620 05/22/19 1245  BP: (!) 141/67 (!) 134/48  Pulse: 90 (!) 103  Resp: 20   Temp: (!) 97.5 F (36.4 C) 97.7 F (36.5 C)  SpO2:  100%   Filed Weights   05/20/19 1015  Weight: 168 lb (76.2 kg)  Physical Exam  Constitutional: He is well-developed, well-nourished, and in no distress.  HENT:  Head: Normocephalic and atraumatic.  Mouth/Throat: Oropharynx is clear and moist. No oropharyngeal exudate.  Eyes: Pupils are equal, round, and reactive to light.  Cardiovascular: Normal rate and regular rhythm.  Pulmonary/Chest: No respiratory distress. He has no wheezes.  Abdominal: Soft. Bowel sounds are normal. He exhibits no distension and no mass. There is no abdominal tenderness. There is no rebound and no guarding.  Musculoskeletal:        General: No tenderness or edema.     Cervical back: Normal range of motion and neck supple.     Comments: Below-knee amputation-right.  Neurological:  Sleepy; but arousable.  Oriented x0-1.  Skin: Skin is warm.     LABORATORY DATA:  I have reviewed the data as listed Lab Results  Component Value Date   WBC 7.1 05/22/2019   HGB 9.3 (L) 05/22/2019   HCT 31.6 (L) 05/22/2019   MCV 73.5 (L) 05/22/2019   PLT 270 05/22/2019   Recent Labs    05/06/19 1816 05/06/19 1816 05/07/19 0451 05/08/19 0555 05/20/19 1033 05/21/19 0528 05/22/19 0653  NA 134*   < > 136   < > 135 137 137  K 4.5   < > 3.9   < > 4.1 3.2* 3.3*  CL 103   < > 103   < > 101 104 105  CO2 21*   < > 25   < > 24 26 20*  GLUCOSE 177*   < > 166*   < > 177* 142* 160*  BUN 31*   < > 29*   < > 32* 31* 33*  CREATININE 1.38*   < > 1.42*   < > 1.84* 1.84* 2.03*  CALCIUM 10.6*   < > 10.3   < > 9.7 8.9 9.3  GFRNONAA 53*   < > 51*   < > 37* 37* 33*  GFRAA >60   < > 59*   < > 43* 43* 38*  PROT 5.7*  --  6.3*  --  6.7  --   --   ALBUMIN 2.6*  --  2.6*  --  3.1*  --   --   AST 14*  --  11*  --  17  --   --   ALT 11  --  10  --  12  --   --    ALKPHOS 47  --  49  --  55  --   --   BILITOT 0.8  --  0.6  --  0.5  --   --   BILIDIR 0.2  --   --   --   --   --   --   IBILI 0.6  --   --   --   --   --   --    < > = values in this interval not displayed.    RADIOGRAPHIC STUDIES: I have personally reviewed the radiological images as listed and agreed with the findings in the report. CT ABDOMEN PELVIS WO CONTRAST  Result Date: 05/07/2019 CLINICAL DATA:  Hyperparathyroidism. Hypercalcemia. Small B-cell lymphoma, on immune therapy. Pulmonary emboli in September. Altered mental status and fever. EXAM: CT CHEST, ABDOMEN AND PELVIS WITHOUT CONTRAST TECHNIQUE: Multidetector CT imaging of the chest, abdomen and pelvis was performed following the standard protocol without IV contrast. COMPARISON:  01/24/2019 FINDINGS: CT CHEST FINDINGS Cardiovascular: Multifactorial degradation, including motion in the lower chest, patient arm  position (not raised above the head), and lack of IV contrast. Aortic and branch vessel atherosclerosis. Tortuous thoracic aorta. Mild cardiomegaly. Multivessel coronary artery atherosclerosis. Mediastinum/Nodes: Left supraclavicular index node measures 2.3 cm on 07/02, increased from 1.3 cm on the prior. Low left jugular nodes measure up to 1.8 cm today versus 1.4 cm on the prior. Relatively similar multiple bilateral axillary nodes. Left paratracheal node measures 1.2 cm on 22/2, newly enlarged since the prior. Hilar regions poorly evaluated without intravenous contrast. Lungs/Pleura: No pleural fluid. Right greater than left subpleural pulmonary nodules are similar, likely subpleural lymph nodes. Example at up to 6 mm on 72/4 in the right upper lobe. New superior segment left lower lobe clustered nodular airspace and ground-glass opacity, including on 74/4. Musculoskeletal: Right upper extremity edema and subcutaneous gas are incompletely imaged, including on 38/2. Question intramuscular hematoma, including on 44/2. No acute osseous  abnormality. CT ABDOMEN PELVIS FINDINGS Hepatobiliary: Multifactorial degradation continuing into the upper abdomen. Grossly normal noncontrast appearance of the liver, gallbladder. Pancreas: Grossly normal pancreas. Spleen: Normal in size, without focal abnormality. Adrenals/Urinary Tract: Normal adrenal glands. Favor renal vascular calcifications over renal calculi. No hydronephrosis. Bladder wall thickening is mild, at least partially felt to be due to underdistention. Stomach/Bowel: Grossly normal stomach. Colonic stool burden suggests constipation. Normal small bowel caliber. Vascular/Lymphatic: Aortic atherosclerosis. Bulky abdominal adenopathy. Index left periaortic node measures 2.7 x 3.6 cm on 77/2 versus 2.2 x 2.8 cm at the same level on the prior. A dominant left periaortic nodal mass measures maximally 6.7 cm on 93/2 today versus 5.5 cm on the prior exam (when remeasured). Index left external iliac node measures 3.3 cm on 116/2 and is similar to on the prior exam (when remeasured). Reproductive: Moderate prostatomegaly. Other: No significant free fluid. Musculoskeletal: No acute osseous abnormality. Lumbosacral junction degenerative disc disease. IMPRESSION: 1. Multifactorial degradation, including lack of IV contrast, motion, patient arm position. 2. Since 01/24/2019, development of left lower lobe nodular airspace disease, suspicious for infection or aspiration. 3. Right upper extremity edema and gas with possible intramuscular hematoma. Correlate with recent trauma or attempted IV placement. If no correlate history, deep venous thrombosis of the right upper extremity to would be a consideration and ultrasound suggested. 4. Progressive lymphoma within the neck, chest, abdomen, as detailed above. Pelvic adenopathy is relatively similar. 5. Prostatomegaly. Apparent bladder wall thickening is at least partially felt to be due to underdistention. Correlate for possible bladder outlet obstruction. 6.  Coronary artery atherosclerosis. Aortic Atherosclerosis (ICD10-I70.0). 7.  Possible constipation. Electronically Signed   By: Abigail Miyamoto M.D.   On: 05/07/2019 16:57   DG Chest 1 View  Result Date: 05/20/2019 CLINICAL DATA:  Fever, altered mental status EXAM: CHEST  1 VIEW COMPARISON:  None. FINDINGS: The heart size and mediastinal contours are within normal limits. Both lungs are clear. No pleural effusion or pneumothorax. The visualized skeletal structures are unremarkable. IMPRESSION: No acute process in the chest Electronically Signed   By: Macy Mis M.D.   On: 05/20/2019 10:57   DG Chest 2 View  Result Date: 05/22/2019 CLINICAL DATA:  Fever EXAM: CHEST - 2 VIEW COMPARISON:  May 20, 2019 FINDINGS: Lungs are clear. Heart size and pulmonary vascularity are normal. No adenopathy. There is degenerative change in the lower thoracic spine. There is a skin fold on the right. No appreciable pneumothorax. IMPRESSION: Lungs clear. Cardiac silhouette within normal limits. Apparent skin fold on the right. Electronically Signed   By: Lowella Grip III M.D.  On: 05/22/2019 08:43   CT HEAD WO CONTRAST  Result Date: 05/20/2019 CLINICAL DATA:  Patient with lymphoma undergoing treatment. Cephalopathy. EXAM: CT HEAD WITHOUT CONTRAST TECHNIQUE: Contiguous axial images were obtained from the base of the skull through the vertex without intravenous contrast. 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 Cerebral hemispheres show chronic small-vessel change of the white matter. No sign of acute infarction, mass lesion, hemorrhage, hydrocephalus or extra-axial collection. Vascular: There is atherosclerotic calcification of the major vessels at the base of the brain. Skull: Negative Sinuses/Orbits: Clear/normal Other: None IMPRESSION: No acute finding. Chronic small-vessel change of the cerebral hemispheric white matter. Old small vessel cerebellar infarctions.  Electronically Signed   By: Nelson Chimes M.D.   On: 05/20/2019 19:56   CT Head Wo Contrast  Result Date: 05/06/2019 CLINICAL DATA:  Altered mental status, unclear cause EXAM: CT HEAD WITHOUT CONTRAST TECHNIQUE: Contiguous axial images were obtained from the base of the skull through the vertex without intravenous contrast. COMPARISON:  None. 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 No CT evident large vascular territory or cortically based infarction is seen. No evidence of acute, hemorrhage, hydrocephalus, extra-axial collection or mass lesion/mass effect. Patchy areas of white matter hypoattenuation are most compatible with chronic microvascular angiopathy. Symmetric prominence of the ventricles, cisterns and sulci compatible with parenchymal volume loss. Vascular: Atherosclerotic calcification of the carotid siphons and intradural vertebral arteries. No hyperdense vessel. Skull: No calvarial fracture or suspicious osseous lesion. No scalp swelling or hematoma. Vascular calcium of the scalp, often seen in diabetes. Sinuses/Orbits: Paranasal sinuses and mastoid air cells are predominantly clear. Included orbital structures are unremarkable. Other: None IMPRESSION: 1. Multiple acquisition required due to patient motion artifact. 2. No convincing CT evidence of acute intracranial abnormality. 3. 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 4. Chronic microangiopathic changes with parenchymal volume loss. Intracranial atherosclerosis. Electronically Signed   By: Lovena Le M.D.   On: 05/06/2019 21:15   CT CHEST WO CONTRAST  Result Date: 05/07/2019 CLINICAL DATA:  Hyperparathyroidism. Hypercalcemia. Small B-cell lymphoma, on immune therapy. Pulmonary emboli in September. Altered mental status and fever. EXAM: CT CHEST, ABDOMEN AND PELVIS WITHOUT CONTRAST TECHNIQUE: Multidetector CT imaging of the chest, abdomen and  pelvis was performed following the standard protocol without IV contrast. COMPARISON:  01/24/2019 FINDINGS: CT CHEST FINDINGS Cardiovascular: Multifactorial degradation, including motion in the lower chest, patient arm position (not raised above the head), and lack of IV contrast. Aortic and branch vessel atherosclerosis. Tortuous thoracic aorta. Mild cardiomegaly. Multivessel coronary artery atherosclerosis. Mediastinum/Nodes: Left supraclavicular index node measures 2.3 cm on 07/02, increased from 1.3 cm on the prior. Low left jugular nodes measure up to 1.8 cm today versus 1.4 cm on the prior. Relatively similar multiple bilateral axillary nodes. Left paratracheal node measures 1.2 cm on 22/2, newly enlarged since the prior. Hilar regions poorly evaluated without intravenous contrast. Lungs/Pleura: No pleural fluid. Right greater than left subpleural pulmonary nodules are similar, likely subpleural lymph nodes. Example at up to 6 mm on 72/4 in the right upper lobe. New superior segment left lower lobe clustered nodular airspace and ground-glass opacity, including on 74/4. Musculoskeletal: Right upper extremity edema and subcutaneous gas are incompletely imaged, including on 38/2. Question intramuscular hematoma, including on 44/2. No acute osseous abnormality. CT ABDOMEN PELVIS FINDINGS Hepatobiliary: Multifactorial degradation continuing into the upper abdomen. Grossly normal noncontrast appearance of the liver, gallbladder. Pancreas: Grossly normal  pancreas. Spleen: Normal in size, without focal abnormality. Adrenals/Urinary Tract: Normal adrenal glands. Favor renal vascular calcifications over renal calculi. No hydronephrosis. Bladder wall thickening is mild, at least partially felt to be due to underdistention. Stomach/Bowel: Grossly normal stomach. Colonic stool burden suggests constipation. Normal small bowel caliber. Vascular/Lymphatic: Aortic atherosclerosis. Bulky abdominal adenopathy. Index left  periaortic node measures 2.7 x 3.6 cm on 77/2 versus 2.2 x 2.8 cm at the same level on the prior. A dominant left periaortic nodal mass measures maximally 6.7 cm on 93/2 today versus 5.5 cm on the prior exam (when remeasured). Index left external iliac node measures 3.3 cm on 116/2 and is similar to on the prior exam (when remeasured). Reproductive: Moderate prostatomegaly. Other: No significant free fluid. Musculoskeletal: No acute osseous abnormality. Lumbosacral junction degenerative disc disease. IMPRESSION: 1. Multifactorial degradation, including lack of IV contrast, motion, patient arm position. 2. Since 01/24/2019, development of left lower lobe nodular airspace disease, suspicious for infection or aspiration. 3. Right upper extremity edema and gas with possible intramuscular hematoma. Correlate with recent trauma or attempted IV placement. If no correlate history, deep venous thrombosis of the right upper extremity to would be a consideration and ultrasound suggested. 4. Progressive lymphoma within the neck, chest, abdomen, as detailed above. Pelvic adenopathy is relatively similar. 5. Prostatomegaly. Apparent bladder wall thickening is at least partially felt to be due to underdistention. Correlate for possible bladder outlet obstruction. 6. Coronary artery atherosclerosis. Aortic Atherosclerosis (ICD10-I70.0). 7.  Possible constipation. Electronically Signed   By: Abigail Miyamoto M.D.   On: 05/07/2019 16:57   US Venous Img Upper Uni Right(DVT)  Result Date: 05/21/2019 CLINICAL DATA:  69 year old with right upper extremity swelling. EXAM: RIGHT UPPER EXTREMITY VENOUS DOPPLER ULTRASOUND TECHNIQUE: Gray-scale sonography with graded compression, as well as color Doppler and duplex ultrasound were performed to evaluate the upper extremity deep venous system from the level of the subclavian vein and including the jugular, axillary, basilic, radial, ulnar and upper cephalic vein. Spectral Doppler was utilized  to evaluate flow at rest and with distal augmentation maneuvers. COMPARISON:  None. FINDINGS: Contralateral Subclavian Vein: Normal color Doppler flow and phasicity. Internal Jugular Vein: No evidence of thrombus. Normal compressibility, color Doppler flow and phasicity. Subclavian Vein: No evidence of thrombus. Normal color Doppler flow and phasicity. Axillary Vein: No evidence of thrombus. Normal compressibility, color Doppler flow and augmentation. Cephalic Vein: No evidence of thrombus. Normal compressibility, color Doppler flow and augmentation. Basilic Vein: No evidence of thrombus. Normal compressibility, color Doppler flow and augmentation. Brachial Veins: No evidence of thrombus. Normal compressibility, color Doppler flow and augmentation. Radial Veins: No evidence of thrombus. Normal compressibility and color Doppler flow. Ulnar Veins: No evidence of thrombus. Normal compressibility and color Doppler flow. Other Findings: No discrete abnormality at the area of concern in the right forearm. IMPRESSION: No evidence of DVT within the right upper extremity. Electronically Signed   By: Markus Daft M.D.   On: 05/21/2019 20:46   DG Chest Portable 1 View  Result Date: 05/06/2019 CLINICAL DATA:  Fevers and altered mental status, history of lymphoma EXAM: PORTABLE CHEST 1 VIEW COMPARISON:  01/24/2019 CT, plain film from 11/13/2017 FINDINGS: Cardiac shadow is within normal limits. The lungs are well aerated bilaterally. No focal infiltrate or sizable effusion is seen. Aortic calcifications are noted. No acute bony abnormality is noted. Exuberant calcification at the first costochondral margins is noted bilaterally. IMPRESSION: No acute abnormality noted. Electronically Signed   By: Linus Mako.D.  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  Given the recent episode of hypercalcemia/fevers-concerning for transformation.  Recommend PET scan for further evaluation.  Discussed with Dr. Emogene Morgan the PET scan inpatient.  #Fevers/mental status changes-unclear etiology.  Infectious versus other causes.  Tumor fevers certainly a possibility however infectious causes need to be ruled out-especially opportunistic viral/fungal.  Discussed with Dr. Ola Spurr.   #Recent episode of hypercalcemia-question malignant s/p bisphosphonate. currently normal.  #Anemia iron deficiency-hemoglobin stable;[declined GI evaluation] monitor closely.   #History of PE-on Eliquis; discontinued in February 2020 because of iron deficiency anemia.  Monitor closely  #Chronic kidney disease/AKI-question prerenal. As per primary team.   Thank you Dr.Wieting for allowing me to participate in the care of your pleasant patient. Please do not hesitate to contact me with questions or concerns in the interim.   All questions were answered. The patient knows to call the clinic with any problems, questions or concerns.    Cammie Sickle, MD 05/22/2019 6:44 PM

## 2019-05-22 NOTE — Evaluation (Signed)
Physical Therapy Evaluation Patient Details Name: [name] MRN: [id number] DOB: 12/16/1950 Today's Date: 05/22/2019   History of Present Illness  Pt is a 69 y.o. 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    Clinical Impression  Pt woken, irritable throughout session but oriented to self, place, situation, time (utilized wife and written questions to assess mental status). Pt wife stated normally at baseline his is ambulatory with bilateral loftstrand crutches and prosthetic (wears all day), has had several "near misses" but no falls. Usually able to navigate the stairs independently (no rails).  The patient demonstrated bed mobility with minAx2 for weight shift, wife donned pt shoe maxA for LLE. Sit <> stand attempted, pt unable to come to full standing with maxA. Squat pivot to recliner with heavy use of pt UE and modA, able to reposition independently once in recliner. Per wife, prosthetic leg not donned, may be safer to attempt tomorrow to assess gait/stair training.  Overall the patient demonstrated deficits (see "PT Problem List") that impede the patient's functional abilities, safety, and mobility and would benefit from skilled PT intervention. Recommendation is HHPT with supervision/assistance 24/7 pending pt progress.     Follow Up Recommendations Home health PT;Supervision/Assistance - 24 hour    Equipment Recommendations  Other (comment)(TBD)    Recommendations for Other Services       Precautions / Restrictions Precautions Precautions: Fall Restrictions Weight Bearing Restrictions: No      Mobility  Bed Mobility Overal bed mobility: Needs Assistance Bed Mobility: Supine to Sit     Supine to sit: Min assist;+2 for  safety/equipment;HOB elevated        Transfers Overall transfer level: Needs assistance Equipment used: 1 person hand held assist Transfers: Sit to/from [employer] Sit to Stand: Max assist   Squat pivot transfers: Mod assist        Ambulation/Gait             General Gait Details: deferred  Stairs            Wheelchair Mobility    Modified Rankin (Stroke Patients Only)       Balance Overall balance assessment: Needs assistance Sitting-balance support: Feet supported Sitting balance-Leahy Scale: Good       Standing balance-Leahy Scale: Poor                               Pertinent Vitals/Pain Pain Assessment: No/denies pain    Home Living Family/patient expects to be discharged to:: Private residence Living Arrangements: Spouse/significant other Available Help at Discharge: Family;Available 24 hours/day Type of Home: House Home Access: Stairs to enter Entrance Stairs-Rails: None Entrance Stairs-Number of Steps: 3 Home Layout: Two level;Able to live on main level with bedroom/bathroom Home Equipment: Grab bars - tub/shower;Shower seat;Other (comment)(uses bilateral loftstrand crutches)      Prior Function Level of Independence: Independent with assistive device(s)               Hand Dominance        Extremity/Trunk Assessment   Upper Extremity Assessment Upper Extremity Assessment: Overall WFL for tasks assessed    Lower Extremity Assessment Lower Extremity Assessment: LLE deficits/detail;RLE deficits/detail RLE Deficits / Details: WFLs LLE Deficits / Details: s/p BKA (10 years ago)  Cervical / Trunk Assessment Cervical / Trunk Assessment: Normal  Communication   Communication: HOH  Cognition Arousal/Alertness: Awake/alert Behavior During Therapy: WFL for tasks assessed/performed Overall Cognitive Status: Within Functional Limits for tasks assessed                                  General Comments: pt oriented to self, place, location, time. Family reports significant improvement in mental status      General Comments      Exercises     Assessment/Plan    PT Assessment Patient needs continued PT services  PT Problem List Decreased strength;Decreased range of motion;Decreased activity tolerance;Decreased balance;Decreased mobility       PT Treatment Interventions DME instruction;Balance training;Gait training;Neuromuscular re-education;Stair training;Functional mobility training;Patient/family education;Therapeutic activities;Therapeutic exercise;Wheelchair mobility training    PT Goals (Current goals can be found in the Care Plan section)  Acute Rehab PT Goals Patient Stated Goal: to go home PT Goal Formulation: With patient Time For Goal Achievement: 06/05/19 Potential to Achieve Goals: Good    Frequency Min 2X/week   Barriers to discharge Inaccessible home environment      Co-evaluation               AM-PAC PT "6 Clicks" Mobility  Outcome Measure Help needed turning from your back to your side while in a flat bed without using bedrails?: A Little Help needed moving from lying on your back to sitting on the side of a flat bed without using bedrails?: A Little Help needed moving to and from a bed to a chair (including a wheelchair)?: A Lot Help needed standing up from a chair using your arms (e.g., wheelchair or bedside chair)?: A Lot Help needed to walk in hospital room?: A Lot Help needed climbing 3-5 steps with a railing? : A Lot 6 Click Score: 14    End of Session   Activity Tolerance: Patient tolerated treatment well Patient left: in chair;with call bell/phone within reach;with chair alarm set;with family/visitor present;with nursing/sitter in room Nurse Communication: Mobility status PT Visit Diagnosis: Other abnormalities of gait and mobility (R26.89);Muscle weakness (generalized) (M62.81);Difficulty in walking, not elsewhere  classified (R26.2)    Time: [id number] PT Time Calculation (min) (ACUTE ONLY): 33 min   Charges:   PT Evaluation $PT Eval Moderate Complexity: 1 Mod PT Treatments $Therapeutic Activity: 8-22 mins        Lieutenant Diego PT, DPT 10:05 AM,05/22/19

## 2019-05-23 ENCOUNTER — Telehealth: Payer: Self-pay | Admitting: Internal Medicine

## 2019-05-23 ENCOUNTER — Inpatient Hospital Stay: Payer: Medicare Other

## 2019-05-23 DIAGNOSIS — N179 Acute kidney failure, unspecified: Secondary | ICD-10-CM

## 2019-05-23 DIAGNOSIS — G9340 Encephalopathy, unspecified: Secondary | ICD-10-CM

## 2019-05-23 DIAGNOSIS — A419 Sepsis, unspecified organism: Principal | ICD-10-CM

## 2019-05-23 DIAGNOSIS — N1831 Chronic kidney disease, stage 3a: Secondary | ICD-10-CM

## 2019-05-23 DIAGNOSIS — R531 Weakness: Secondary | ICD-10-CM

## 2019-05-23 DIAGNOSIS — R6520 Severe sepsis without septic shock: Secondary | ICD-10-CM

## 2019-05-23 DIAGNOSIS — Z515 Encounter for palliative care: Secondary | ICD-10-CM

## 2019-05-23 LAB — CBC
HCT: 26.9 % — ABNORMAL LOW (ref 39.0–52.0)
Hemoglobin: 8.2 g/dL — ABNORMAL LOW (ref 13.0–17.0)
MCH: 22 pg — ABNORMAL LOW (ref 26.0–34.0)
MCHC: 30.5 g/dL (ref 30.0–36.0)
MCV: 72.1 fL — ABNORMAL LOW (ref 80.0–100.0)
Platelets: 277 10*3/uL (ref 150–400)
RBC: 3.73 MIL/uL — ABNORMAL LOW (ref 4.22–5.81)
RDW: 18.9 % — ABNORMAL HIGH (ref 11.5–15.5)
WBC: 7.3 10*3/uL (ref 4.0–10.5)
nRBC: 0 % (ref 0.0–0.2)

## 2019-05-23 LAB — CMV DNA, QUANTITATIVE, PCR
CMV DNA Quant: NEGATIVE IU/mL
Log10 CMV Qn DNA Pl: UNDETERMINED log10 IU/mL

## 2019-05-23 LAB — BASIC METABOLIC PANEL
Anion gap: 10 (ref 5–15)
BUN: 38 mg/dL — ABNORMAL HIGH (ref 8–23)
CO2: 20 mmol/L — ABNORMAL LOW (ref 22–32)
Calcium: 9 mg/dL (ref 8.9–10.3)
Chloride: 107 mmol/L (ref 98–111)
Creatinine, Ser: 2.22 mg/dL — ABNORMAL HIGH (ref 0.61–1.24)
GFR calc Af Amer: 34 mL/min — ABNORMAL LOW (ref >60)
GFR calc non Af Amer: 30 mL/min — ABNORMAL LOW (ref >60)
Glucose, Bld: 119 mg/dL — ABNORMAL HIGH (ref 70–99)
Potassium: 3.6 mmol/L (ref 3.5–5.1)
Sodium: 137 mmol/L (ref 135–145)

## 2019-05-23 LAB — GLUCOSE, CAPILLARY
Glucose-Capillary: 120 mg/dL — ABNORMAL HIGH (ref 70–99)
Glucose-Capillary: 123 mg/dL — ABNORMAL HIGH (ref 70–99)
Glucose-Capillary: 247 mg/dL — ABNORMAL HIGH (ref 70–99)
Glucose-Capillary: 203 mg/dL — ABNORMAL HIGH (ref 70–99)

## 2019-05-23 MED ORDER — QUETIAPINE FUMARATE 25 MG PO TABS
25.0000 mg | ORAL_TABLET | Freq: Every day | ORAL | Status: DC
  Administered 2019-05-23: 25 mg via ORAL
  Filled 2019-05-23: qty 1

## 2019-05-23 MED ORDER — TAMSULOSIN HCL 0.4 MG PO CAPS
0.4000 mg | ORAL_CAPSULE | Freq: Every day | ORAL | Status: DC
  Administered 2019-05-23 – 2019-05-24 (×2): 0.4 mg via ORAL
  Filled 2019-05-23 (×2): qty 1

## 2019-05-23 MED ORDER — FLUDEOXYGLUCOSE F - 18 (FDG) INJECTION
9.3900 | Freq: Once | INTRAVENOUS | Status: AC | PRN
  Administered 2019-05-23: 9.39 via INTRAVENOUS

## 2019-05-23 NOTE — Progress Notes (Signed)
[name]   DOB:07/07/1950   [id number]    Subjective: Patient resting comfortably.  However agitated wanted to go home-when woken up.  No fevers overnight.  Objective:  Vitals:   05/23/19 1512 05/23/19 2038  BP: (!) 110/57 (!) 155/75  Pulse: 96 93  Resp:  20  Temp: (!) 97.4 F (36.3 C) 98.2 F (36.8 C)  SpO2: 100% 99%     Intake/Output Summary (Last 24 hours) at 05/23/2019 2242 Last data filed at 05/23/2019 2039 Gross per 24 hour  Intake 0 ml  Output 1750 ml  Net -1750 ml    Physical Exam  Constitutional: He is well-developed, well-nourished, and in no distress.  HENT:  Head: Normocephalic and atraumatic.  Mouth/Throat: Oropharynx is clear and moist. No oropharyngeal exudate.  Eyes: Pupils are equal, round, and reactive to light.  Cardiovascular: Normal rate and regular rhythm.  Pulmonary/Chest: No respiratory distress. He has no wheezes.  Decreased breath sounds in the bases.  Abdominal: Soft. Bowel sounds are normal. He exhibits no distension and no mass. There is no abdominal tenderness. There is no rebound and no guarding.  Musculoskeletal:        General: No tenderness or edema.     Cervical back: Normal range of motion and neck supple.     Comments: Left lower extremity BKA.  Neurological: He is alert.  Oriented x2-3.  Skin: Skin is warm.  Psychiatric:  Agitated.     Labs:  Lab Results  Component Value Date   WBC 7.3 05/23/2019   HGB 8.2 (L) 05/23/2019   HCT 26.9 (L) 05/23/2019   MCV 72.1 (L) 05/23/2019   PLT 277 05/23/2019   NEUTROABS 7.0 05/20/2019    Lab Results  Component Value Date   NA 137 05/23/2019   K 3.6 05/23/2019   CL 107 05/23/2019   CO2 20 (L) 05/23/2019    Studies:  DG Chest 2 View  Result Date: 05/22/2019 CLINICAL DATA:  Fever EXAM: CHEST - 2 VIEW COMPARISON:  May 20, 2019 FINDINGS: Lungs are clear. Heart size and pulmonary vascularity are normal. No adenopathy. There is degenerative change in the lower thoracic  spine. There is a skin fold on the right. No appreciable pneumothorax. IMPRESSION: Lungs clear. Cardiac silhouette within normal limits. Apparent skin fold on the right. Electronically Signed   By: Lowella Grip III M.D.   On: 05/22/2019 08:43   NM PET Image Restag (PS) Skull Base To Thigh  Result Date: 05/23/2019 CLINICAL DATA:  Subsequent treatment strategy for small B-cell lymphoma, on immune therapy. EXAM: NUCLEAR MEDICINE PET SKULL BASE TO THIGH TECHNIQUE: 9.4 mCi F-18 FDG was injected intravenously. Full-ring PET imaging was performed from the skull base to thigh after the radiotracer. CT data was obtained and used for attenuation correction and anatomic localization. Fasting blood glucose: 123 mg/dl COMPARISON:  01/01/2018 FINDINGS: Mediastinal blood pool activity: SUV max 1.9 Liver activity: SUV max 2.8 NECK: Most of the bulky bilateral Deauville 4 neck adenopathy shown on the PET-CT from 01/01/2018 has resolved, but as noted on recent diagnostic CT examinations, has been a progressive recent increase in left level IV and left level V lymph nodes including a left level IV node measuring 1.7 cm on image 67/3 with maximum SUV of 11.7 (Deauville 5) and a left level V lymph node measuring 2.1 cm on image 66/3 with maximum SUV 7.3, Deauville 5. Incidental CT findings: Bilateral common carotid atherosclerotic calcification is noted. CHEST: Left supraclavicular adenopathy is present and includes  a 1.6 cm node on image 75/3 with maximum SUV 9.4 (Deauville 5). On prior PET-CT this node had a maximum SUV of 3.6. Although the bulky axillary adenopathy shown on the prior chest CT has mostly resolved, there are some residual axillary lymph nodes including a 1.1 cm right axillary node on image 94/3 with maximum SUV 1.7, Deauville 2. Incidental CT findings: Coronary, aortic arch, and branch vessel atherosclerotic vascular disease. Mild cardiomegaly. Trace bilateral pleural effusions. There is some new  reticulonodular and patchy airspace opacity medially in the right lower lobe on image 118/3, not present on 05/07/2019. On the other hand, some of the airspace opacity in the left lower lobe has improved, with residual nodular sub solid densities in the left lower lobe but with the more consolidative region shown earlier this month resolved. The appearance could be from aspiration pneumonitis or atypical pneumonia. ABDOMEN/PELVIS: Bulky Deauville 5 periaortic/retroperitoneal adenopathy extending down into the left common iliac and external iliac chains. An index left periaortic lymph node measuring 5.8 cm in short axis on image 219/3 (previously 5.2 cm on 01/01/2018) has a maximum SUV of 14.9, Deauville 5 (formerly 10.5, likewise Deauville 5). However, many of the other periaortic lymph nodes shown on the prior PET-CT were Deauville 4 previously, and are now Deauville 5. A left external iliac/pelvic sidewall node measures 3.6 cm in short axis on image 260/3 (formerly 6.5 cm) with maximum SUV 9.3, Deauville 5 (formerly 7.6). The previously bulky inguinal adenopathy has resolved. The previously bulky right iliac adenopathy has resolved. No splenomegaly or abnormal splenic activity. Incidental CT findings: Aortoiliac atherosclerotic vascular disease. Mesenteric, retroperitoneal, and omental infiltrative edema. There is an air-level in the rectum which may indicate a diarrheal process. Large left scrotal hydrocele. Prostatomegaly. SKELETON: Left antecubital activity is injection related. No abnormal skeletal activity is observed. Incidental CT findings: Old healed right anterior rib fractures. Cervical, thoracic, and lumbar spondylosis. IMPRESSION: 1. Although much of the bulky adenopathy in the neck, chest, and abdomen/pelvis has resolved compared to the prior PET-CT of 01/01/2018, today's exam and interval CT exams reveal that there is recurrent adenopathy in the left lower neck, left supraclavicular region, in the  retroperitoneum, and in the left hemipelvis which is higher in metabolic activity than that shown on prior PET-CT (currently Deauville 5, previously ranging from Deauville 2 through Deauville 5). 2. There is some scattered residual axillary lymph nodes, currently Deauville 2 activity. 3. Patchy airspace opacities in the lower lobes, increased on the right but reduced on the left, possibly from atypical pneumonia or aspiration pneumonitis. 4. Air fluid level in the rectum favoring diarrheal process. 5. Other imaging findings of potential clinical significance: Aortic Atherosclerosis (ICD10-I70.0). Coronary atherosclerosis. Mild cardiomegaly. Trace bilateral pleural effusions. Mesenteric edema. Large left scrotal hydrocele. Prostatomegaly. Electronically Signed   By: Van Clines M.D.   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  Given the recent episode of hypercalcemia/fevers-concerning for transformation.  PET scan this morning  #Fevers/mental status changes-unclear etiology.  Infectious versus chemo fevers.  Discussed with Dr. Ola Spurr.   #CKD-AKI prerenal-overall stable monitor closely; recent hypercalcemia-s/p bisphosphonate improved  #Anemia iron deficiency-hemoglobin stable;[declined GI evaluation] monitor closely.   #History of PE-on Eliquis; discontinued in February 2020 because of iron deficiency  anemia.  Monitor closely  # Addendum #1: I do long discussion with patient's wife regarding the concerning for progressive disease/aggressive/transformation-high possibility.  Discussed that treatment patients life expectancy would be weeks to months [based hypercalcemia/worsening renal function].   However if is treated-median survival of few years [again based upon response].  Patient's wife is also frustrated with patient's stubbornness/noncompliance.  Obviously patient's mental status changes have made the situation more complicated and difficult for the wife.  Patient wants to go home; however patient's is concerned that she is unable to care of him at home.  Discussed regarding palliative care evaluation.  She is in agreement.  However if patient is clinically stable-patient will be discharged home from oncology standpoint.  Will make follow-up appointments in the clinic early next week to discuss treatment options.  Discussed with Josh.  #Addendum #2 PET scan reviewed-progression of disease noted as suspected on the CT scan-SUV in the range of 10.  Unclear of transformation.  In the ideal situation would recommend biopsy; but given patient's general reluctance/noncompliance a biopsy will be difficult to offer. Will order 2 D echo for anticipation of chemo as out pt.  This will be need to be discussed.   # 35 minutes face-to-face with the patient/wife discussing the above plan of care; more than 50% of time spent on prognosis/ natural history; counseling and coordination.    Cammie Sickle, MD 05/23/2019  10:42 PM

## 2019-05-23 NOTE — Progress Notes (Signed)
Patient ID: [name], male   DOB: May 25, 1950, 69 y.o.   MRN: [id number] [facility] PROGRESS NOTE  Ayaansh Juras [id number] DOB: [birth year] DOA: 05/20/2019 PCP: [name], MD  HPI/Subjective: Patient seen while wife was in the room and while palliative care was also in the room.  Patient very difficult to focus likely because of his hearing problem.  He was talking about fixing mechanical problems.  I tried to focus on his physical ailments.  Objective: Vitals:   05/23/19 0619 05/23/19 1512  BP: 134/69 (!) 110/57  Pulse: 90 96  Resp:    Temp: 98.9 F (37.2 C) (!) 97.4 F (36.3 C)  SpO2: 100% 100%    Intake/Output Summary (Last 24 hours) at 05/23/2019 1627 Last data filed at 05/23/2019 0829 Gross per 24 hour  Intake 120 ml  Output 1750 ml  Net -1630 ml   Filed Weights   05/20/19 1015  Weight: 76.2 kg    ROS: Review of Systems  Unable to perform ROS: Other  Hard of hearing Exam: Physical Exam  HENT:  Nose: No mucosal edema.  Mouth/Throat: No oropharyngeal exudate or posterior oropharyngeal edema.  Eyes: Pupils are equal, round, and reactive to light. Conjunctivae and lids are normal.  Cardiovascular: S1 normal and S2 normal. Exam reveals no gallop.  No murmur heard. Respiratory: No respiratory distress. He has no wheezes. He has no rhonchi. He has no rales.  GI: Soft. Bowel sounds are normal. There is no abdominal tenderness.  Musculoskeletal:     Left knee: No swelling.     Right ankle: No swelling.  Lymphadenopathy:    He has no cervical adenopathy.  Neurological: He is alert.  Skin: Skin is warm. No rash noted. Nails show no clubbing.  Psychiatric: He has a normal mood and affect.      Data Reviewed: Basic Metabolic Panel: Recent Labs  Lab 05/20/19 1033 05/21/19 0528 05/22/19 0653 05/23/19 0537  NA 135 137 137 137  K 4.1 3.2* 3.3* 3.6  CL 101 104 105 107  CO2 24 26 20* 20*  GLUCOSE 177* 142* 160* 119*  BUN 32* 31*  33* 38*  CREATININE 1.84* 1.84* 2.03* 2.22*  CALCIUM 9.7 8.9 9.3 9.0   Liver Function Tests: Recent Labs  Lab 05/20/19 1033  AST 17  ALT 12  ALKPHOS 55  BILITOT 0.5  PROT 6.7  ALBUMIN 3.1*   CBC: Recent Labs  Lab 05/20/19 1033 05/21/19 0528 05/22/19 0653 05/23/19 0537  WBC 9.6 8.3 7.1 7.3  NEUTROABS 7.0  --   --   --   HGB 9.7* 8.4* 9.3* 8.2*  HCT 33.2* 28.4* 31.6* 26.9*  MCV 75.1* 74.0* 73.5* 72.1*  PLT 333 310 270 277   BNP (last 3 results) Recent Labs    05/20/19 1315  BNP 90.0     CBG: Recent Labs  Lab 05/22/19 1247 05/22/19 1616 05/22/19 2131 05/23/19 0814 05/23/19 1042  GLUCAP 342* 220* 129* 120* 123*    Recent Results (from the past 240 hour(s))  Blood culture (routine x 2)     Status: None (Preliminary result)   Collection Time: 05/20/19 10:33 AM   Specimen: BLOOD  Result Value Ref Range Status   Specimen Description BLOOD LEFT AC  Final   Special Requests   Final    BOTTLES DRAWN AEROBIC AND ANAEROBIC Blood Culture results may not be optimal due to an inadequate volume of blood received in culture bottles   Culture   Final  NO GROWTH 3 DAYS Performed at [facility], [address] [place], [place] [address]    Report Status PENDING  Incomplete  Urine Culture     Status: None   Collection Time: 05/20/19 10:33 AM   Specimen: Urine, Random  Result Value Ref Range Status   Specimen Description   Final    URINE, RANDOM Performed at [facility], [address], [place], [place] [address]    Special Requests   Final    NONE Performed at [facility], [address], [place], [place] [address]    Culture   Final    NO GROWTH Performed at [facility], [place] [address] [place], [place] [address]    Report Status 05/21/2019 FINAL  Final  Respiratory Panel by RT PCR (Flu A&B, Covid) - Nasopharyngeal Swab     Status: None   Collection Time: 05/20/19  1:21 PM   Specimen: Nasopharyngeal Swab  Result  Value Ref Range Status   SARS Coronavirus 2 by RT PCR NEGATIVE NEGATIVE Final    Comment: (NOTE) SARS-CoV-2 target nucleic acids are NOT DETECTED. The SARS-CoV-2 RNA is generally detectable in upper respiratoy specimens during the acute phase of infection. The lowest concentration of SARS-CoV-2 viral copies this assay can detect is 131 copies/mL. A negative result does not preclude SARS-Cov-2 infection and should not be used as the sole basis for treatment or other patient management decisions. A negative result may occur with  improper specimen collection/handling, submission of specimen other than nasopharyngeal swab, presence of viral mutation(s) within the areas targeted by this assay, and inadequate number of viral copies (<131 copies/mL). A negative result must be combined with clinical observations, patient history, and epidemiological information. The expected result is Negative. Fact Sheet for Patients:  PinkCheek.be Fact Sheet for Healthcare Providers:  GravelBags.it This test is not yet ap proved or cleared by the [place] FDA and  has been authorized for detection and/or diagnosis of SARS-CoV-2 by FDA under an Emergency Use Authorization (EUA). This EUA will remain  in effect (meaning this test can be used) for the duration of the COVID-19 declaration under Section 564(b)(1) of the Act, 21 U.S.C. section 360bbb-3(b)(1), unless the authorization is terminated or revoked sooner.    Influenza A by PCR NEGATIVE NEGATIVE Final   Influenza B by PCR NEGATIVE NEGATIVE Final    Comment: (NOTE) The Xpert Xpress SARS-CoV-2/FLU/RSV assay is intended as an aid in  the diagnosis of influenza from Nasopharyngeal swab specimens and  should not be used as a sole basis for treatment. Nasal washings and  aspirates are unacceptable for Xpert Xpress SARS-CoV-2/FLU/RSV  testing. Fact Sheet for  Patients: PinkCheek.be Fact Sheet for Healthcare Providers: GravelBags.it This test is not yet approved or cleared by the [place] FDA and  has been authorized for detection and/or diagnosis of SARS-CoV-2 by  FDA under an Emergency Use Authorization (EUA). This EUA will remain  in effect (meaning this test can be used) for the duration of the  Covid-19 declaration under Section 564(b)(1) of the Act, 21  U.S.C. section 360bbb-3(b)(1), unless the authorization is  terminated or revoked. Performed at [facility], [address], [place], [place] [address]      Studies: DG Chest 2 View  Result Date: 05/22/2019 CLINICAL DATA:  Fever EXAM: CHEST - 2 VIEW COMPARISON:  May 20, 2019 FINDINGS: Lungs are clear. Heart size and pulmonary vascularity are normal. No adenopathy. There is degenerative change in the  lower thoracic spine. There is a skin fold on the right. No appreciable pneumothorax. IMPRESSION: Lungs clear. Cardiac silhouette within normal limits. Apparent skin fold on the right. Electronically Signed   By: Lowella Grip III M.D.   On: 05/22/2019 08:43   NM PET Image Restag (PS) Skull Base To Thigh  Result Date: 05/23/2019 CLINICAL DATA:  Subsequent treatment strategy for small B-cell lymphoma, on immune therapy. EXAM: NUCLEAR MEDICINE PET SKULL BASE TO THIGH TECHNIQUE: 9.4 mCi F-18 FDG was injected intravenously. Full-ring PET imaging was performed from the skull base to thigh after the radiotracer. CT data was obtained and used for attenuation correction and anatomic localization. Fasting blood glucose: 123 mg/dl COMPARISON:  01/01/2018 FINDINGS: Mediastinal blood pool activity: SUV max 1.9 Liver activity: SUV max 2.8 NECK: Most of the bulky bilateral Deauville 4 neck adenopathy shown on the PET-CT from 01/01/2018 has resolved, but as noted on recent diagnostic CT examinations, has been a progressive recent  increase in left level IV and left level V lymph nodes including a left level IV node measuring 1.7 cm on image 67/3 with maximum SUV of 11.7 (Deauville 5) and a left level V lymph node measuring 2.1 cm on image 66/3 with maximum SUV 7.3, Deauville 5. Incidental CT findings: Bilateral common carotid atherosclerotic calcification is noted. CHEST: Left supraclavicular adenopathy is present and includes a 1.6 cm node on image 75/3 with maximum SUV 9.4 (Deauville 5). On prior PET-CT this node had a maximum SUV of 3.6. Although the bulky axillary adenopathy shown on the prior chest CT has mostly resolved, there are some residual axillary lymph nodes including a 1.1 cm right axillary node on image 94/3 with maximum SUV 1.7, Deauville 2. Incidental CT findings: Coronary, aortic arch, and branch vessel atherosclerotic vascular disease. Mild cardiomegaly. Trace bilateral pleural effusions. There is some new reticulonodular and patchy airspace opacity medially in the right lower lobe on image 118/3, not present on 05/07/2019. On the other hand, some of the airspace opacity in the left lower lobe has improved, with residual nodular sub solid densities in the left lower lobe but with the more consolidative region shown earlier this month resolved. The appearance could be from aspiration pneumonitis or atypical pneumonia. ABDOMEN/PELVIS: Bulky Deauville 5 periaortic/retroperitoneal adenopathy extending down into the left common iliac and external iliac chains. An index left periaortic lymph node measuring 5.8 cm in short axis on image 219/3 (previously 5.2 cm on 01/01/2018) has a maximum SUV of 14.9, Deauville 5 (formerly 10.5, likewise Deauville 5). However, many of the other periaortic lymph nodes shown on the prior PET-CT were Deauville 4 previously, and are now Deauville 5. A left external iliac/pelvic sidewall node measures 3.6 cm in short axis on image 260/3 (formerly 6.5 cm) with maximum SUV 9.3, Deauville 5 (formerly  7.6). The previously bulky inguinal adenopathy has resolved. The previously bulky right iliac adenopathy has resolved. No splenomegaly or abnormal splenic activity. Incidental CT findings: Aortoiliac atherosclerotic vascular disease. Mesenteric, retroperitoneal, and omental infiltrative edema. There is an air-level in the rectum which may indicate a diarrheal process. Large left scrotal hydrocele. Prostatomegaly. SKELETON: Left antecubital activity is injection related. No abnormal skeletal activity is observed. Incidental CT findings: Old healed right anterior rib fractures. Cervical, thoracic, and lumbar spondylosis. IMPRESSION: 1. Although much of the bulky adenopathy in the neck, chest, and abdomen/pelvis has resolved compared to the prior PET-CT of 01/01/2018, today's exam and interval CT exams reveal that there is recurrent adenopathy in the left lower neck,  left supraclavicular region, in the retroperitoneum, and in the left hemipelvis which is higher in metabolic activity than that shown on prior PET-CT (currently Deauville 5, previously ranging from Deauville 2 through Deauville 5). 2. There is some scattered residual axillary lymph nodes, currently Deauville 2 activity. 3. Patchy airspace opacities in the lower lobes, increased on the right but reduced on the left, possibly from atypical pneumonia or aspiration pneumonitis. 4. Air fluid level in the rectum favoring diarrheal process. 5. Other imaging findings of potential clinical significance: Aortic Atherosclerosis (ICD10-I70.0). Coronary atherosclerosis. Mild cardiomegaly. Trace bilateral pleural effusions. Mesenteric edema. Large left scrotal hydrocele. Prostatomegaly. Electronically Signed   By: Van Clines M.D.   On: 05/23/2019 13:25   US Venous Img Upper Uni Right(DVT)  Result Date: 05/21/2019 CLINICAL DATA:  69 year old with right upper extremity swelling. EXAM: RIGHT UPPER EXTREMITY VENOUS DOPPLER ULTRASOUND TECHNIQUE: Gray-scale  sonography with graded compression, as well as color Doppler and duplex ultrasound were performed to evaluate the upper extremity deep venous system from the level of the subclavian vein and including the jugular, axillary, basilic, radial, ulnar and upper cephalic vein. Spectral Doppler was utilized to evaluate flow at rest and with distal augmentation maneuvers. COMPARISON:  None. FINDINGS: Contralateral Subclavian Vein: Normal color Doppler flow and phasicity. Internal Jugular Vein: No evidence of thrombus. Normal compressibility, color Doppler flow and phasicity. Subclavian Vein: No evidence of thrombus. Normal color Doppler flow and phasicity. Axillary Vein: No evidence of thrombus. Normal compressibility, color Doppler flow and augmentation. Cephalic Vein: No evidence of thrombus. Normal compressibility, color Doppler flow and augmentation. Basilic Vein: No evidence of thrombus. Normal compressibility, color Doppler flow and augmentation. Brachial Veins: No evidence of thrombus. Normal compressibility, color Doppler flow and augmentation. Radial Veins: No evidence of thrombus. Normal compressibility and color Doppler flow. Ulnar Veins: No evidence of thrombus. Normal compressibility and color Doppler flow. Other Findings: No discrete abnormality at the area of concern in the right forearm. IMPRESSION: No evidence of DVT within the right upper extremity. Electronically Signed   By: Markus Daft M.D.   On: 05/21/2019 20:46    Scheduled Meds: . vitamin C  250 mg Oral Daily  . feeding supplement (NEPRO CARB STEADY)  237 mL Oral TID BM  . ferrous sulfate  325 mg Oral Daily  . heparin  5,000 Units Subcutaneous Q8H  . insulin aspart  0-5 Units Subcutaneous QHS  . insulin aspart  0-9 Units Subcutaneous TID WC  . metroNIDAZOLE  500 mg Oral Q8H  . multivitamin with minerals  1 tablet Oral Daily  . pravastatin  40 mg Oral QHS  . QUEtiapine  25 mg Oral QHS  . tamsulosin  0.4 mg Oral QPC breakfast    Continuous Infusions: . sodium chloride 75 mL/hr at 05/22/19 2213  . ceFEPime (MAXIPIME) IV 2 g (05/22/19 2215)    Assessment/Plan:  1. Clinical sepsis with fever, tachycardia and tachypnea and elevated lactic acid and acute metabolic encephalopathy.  PET scan positive for what they commented on pneumonia..  Patient on empiric Flagyl and cefepime.  So far cultures are negative.  Patient's last high temperature was on the 18th at 1 in the morning.  Other temperature since then have been in the normal range.  If patient remains afebrile today and tomorrow morning may be able to go home tomorrow morning 2. Acute kidney injury on chronic kidney disease stage IIIa.  Increase rate of IV fluids to 75 cc/h.  Asked nurse to get a bladder scan  and I started empiric Flomax.  Creatinine worsening a little bit every day.  May be secondary to the vancomycin that was given upon admission. 3. 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  On sliding scale insulin here.  Hemoglobin A1c 7.3 4. Essential hypertension.  Holding amlodipine 5. Hyperlipidemia on pravastatin 6. Small cell B-cell lymphoma.  PET CT scan showing certain areas that have gotten better and certain areas that show higher metabolic activity.  Overall treatment plan up to Dr. Oneita Jolly day as outpatient. 7. CT scan commenting on hematoma in right upper extremity.  Ultrasound negative for DVT 8. Physical therapy evaluation.  Wife was hoping for physical therapy to see him 1 more time. 9. Severe protein calorie malnutrition 10. Anemia.  Patient has history of blood loss but also has chronic disease.  Hemoglobin 8.2. 11. Insomnia will give Seroquel tonight.   Code Status:     Code Status Orders  (From admission, onward)         Start     Ordered   05/20/19 1443  Full code  Continuous     05/20/19 1443        Code Status History    Date Active Date Inactive Code Status Order ID Comments User Context   05/06/2019 2257  05/09/2019 1946 Full Code [id number]  Orene Desanctis, DO ED   12/26/2017 0031 12/27/2017 1908 Full Code [id number]  Lance Coon, MD ED   06/08/2011 1642 06/12/2011 1822 Full Code [id number]  Scarlette Calico, RN Inpatient   Advance Care Planning Activity     Family Communication: Wife at the bedside Disposition Plan: Last fever of 102 was 318 at 1 in the morning.  If remains afebrile today and tomorrow morning, I may be able to send home with home health.  Also would like to see creatinine improve.  Consultants:  Oncology  Antibiotics:  Flagyl  Cefepime  Time spent: 26 minutes  [place]

## 2019-05-23 NOTE — Consult Note (Signed)
Pharmacy Antibiotic Note  [name] is a 69 y.o. male admitted on 05/20/2019 with sepsis.  Pharmacy has been consulted for Cefepime/Vancomycin dosing.  Plan: Continue Cefepime 2g IV q12H   Height: 6\' 1"  (185.4 cm) Weight: 168 lb (76.2 kg) IBW/kg (Calculated) : 79.9  Temp (24hrs), Avg:98.3 F (36.8 C), Min:97.7 F (36.5 C), Max:98.9 F (37.2 C)  Recent Labs  Lab 05/20/19 1033 05/20/19 1231 05/20/19 1854 05/20/19 2144 05/21/19 0528 05/22/19 0653 05/23/19 0537  WBC 9.6  --   --   --  8.3 7.1 7.3  CREATININE 1.84*  --   --   --  1.84* 2.03* 2.22*  LATICACIDVEN 2.1* 1.5 1.4 1.0  --   --   --     Estimated Creatinine Clearance: 34.8 mL/min (A) (by C-G formula based on SCr of 2.22 mg/dL (H)).    Allergies  Allergen Reactions  . Penicillins Anaphylaxis and Other (See Comments)    Has patient had a PCN reaction causing immediate rash, facial/tongue/throat swelling, SOB or lightheadedness with hypotension: Unknown Has patient had a PCN reaction causing severe rash involving mucus membranes or skin necrosis: Unknown Has patient had a PCN reaction that required hospitalization: Unknown Has patient had a PCN reaction occurring within the last 10 years: No If all of the above answers are "NO", then may proceed with Cephalosporin use.     Antimicrobials this admission: 3/16 Aztreonam/Metronidazole x 1 3/16 Vancomycin >> 3/18 3/16 Cefepime >>  Dose adjustments this admission: none  Microbiology results: 3/16 BCx: NGTD 3/16 UCx: NG  3/16 COVID/FLU NEG  Thank you for allowing pharmacy to be a part of this patient's care.  Rocky Morel, PharmD, BCPS Clinical Pharmacist 05/23/2019 11:30 AM

## 2019-05-23 NOTE — Progress Notes (Signed)
Physical Therapy Treatment Patient Details Name: [name] MRN: [id number] DOB: 04-Sep-1950 Today's Date: 05/23/2019    History of Present Illness Pt is a 69 y.o. 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    PT Comments    Pt was long sitting in bed with spouse present. He is alert and cooperative but impulsive and HOH. Pt had BM in bed and was requesting to get to [facility]. Mod assist to stand pivot to [facility]. Due to needing to get to [facility] quickly, performed without LLE prosthetic. After BM on BSC, therapist applied prosthetic and pt stood and ambulated ~ 45 ft. Vitals stable throughout. vcs for safety and posture correction. Pt is impulsive and has poor safety awareness. Discussed with spouse d/c disposition. She feels confident in his abilities and wanting to take pt home with HHPT. He would benefit from continued PT to address deficits with gait, balance, strength, and overall safe functional mobility. Discussed with pt/pt's spouse equipment needs.      Follow Up Recommendations  Home health PT;Supervision/Assistance - 24 hour     Equipment Recommendations  Rolling walker with 5" wheels;3in1 (PT)    Recommendations for Other Services       Precautions / Restrictions Precautions Precautions: Fall Required Braces or Orthoses: Other Brace(LLE prosthetic BKA) Restrictions Weight Bearing Restrictions: No    Mobility  Bed Mobility Overal bed mobility: Needs Assistance Bed Mobility: Supine to Sit     Supine to sit: Min assist     General bed mobility comments: pt was able to progress form supine to EOB short sit with increased time + vcs for improved technique. Pt requested to have BM and quickly starts to progress towards BSC prior to LLE prosthetic  being donned. pt performed stand pivot on BSC without LLE on. mod assist to complete.  Transfers Overall transfer level: Needs assistance Equipment used: Rolling walker (2 wheeled) Transfers: Sit to/from [facility] Sit to Stand: Min assist;Mod assist Stand pivot transfers: Mod assist       General transfer comment: pt was able to stand from EOB and from [facility] 2 x each. Vcs throughout to slow down 2/2 to impulsiveity. Overall pt's spouse feels he is "performing transfers as well as he usually does"  Ambulation/Gait Ambulation/Gait assistance: Min guard;Min assist Gait Distance (Feet): 45 Feet Assistive device: Rolling walker (2 wheeled) Gait Pattern/deviations: Step-to pattern;Trunk flexed Gait velocity: decreased   General Gait Details: pt tolerated ambulating 45 ft with RW + gait belt. constant vcs fopr posture correction and safety. HR elevated to 110 and sao2 > 94% on rm air   Stairs             Wheelchair Mobility    Modified Rankin (Stroke Patients Only)       Balance                                            Cognition Arousal/Alertness: Awake/alert Behavior During Therapy: Impulsive Overall Cognitive Status: Difficult to assess                                 General Comments: pt is  alert throughout but is pleasantly confused. He was able to follow simple one step commands but requires increased time to process      Exercises      General Comments        Pertinent Vitals/Pain Pain Assessment: No/denies pain    Home Living                      Prior Function            PT Goals (current goals can now be found in the care plan section) Acute Rehab PT Goals Patient Stated Goal: to go home Progress towards PT goals: Progressing toward goals    Frequency    Min 2X/week      PT Plan Current plan remains appropriate    Co-evaluation              AM-PAC PT "6 Clicks"  Mobility   Outcome Measure  Help needed turning from your back to your side while in a flat bed without using bedrails?: A Little Help needed moving from lying on your back to sitting on the side of a flat bed without using bedrails?: A Little Help needed moving to and from a bed to a chair (including a wheelchair)?: A Little Help needed standing up from a chair using your arms (e.g., wheelchair or bedside chair)?: A Lot Help needed to walk in hospital room?: A Lot Help needed climbing 3-5 steps with a railing? : A Lot 6 Click Score: 15    End of Session Equipment Utilized During Treatment: Gait belt Activity Tolerance: Patient tolerated treatment well Patient left: in chair;with call bell/phone within reach;with chair alarm set;with family/visitor present;with nursing/sitter in room Nurse Communication: Mobility status PT Visit Diagnosis: Other abnormalities of gait and mobility (R26.89);Muscle weakness (generalized) (M62.81);Difficulty in walking, not elsewhere classified (R26.2)     Time: [id number] PT Time Calculation (min) (ACUTE ONLY): 31 min  Charges:  $Gait Training: 8-22 mins $Therapeutic Activity: 8-22 mins                     Julaine Fusi PTA 05/23/19, 3:48 PM

## 2019-05-23 NOTE — Consult Note (Signed)
[place]  Telephone:([phone] [phone]   Name: [name] Date: 05/23/2019 MRN: [id number]  DOB: [birth year]  Patient Care Team: [name], MD as PCP - General (Internal Medicine)    REASON FOR CONSULTATION: [name] is a 69 y.o. 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  Patient was recently hospitalized 05/06/2019-05/09/2019 with altered mental status and fevers.  Patient was treated for possible aspiration pneumonia.  Patient never quite returned to his previous baseline with some residual confusion and weakness.  He was readmitted 05/20/2019 again with altered mental status and confusion.  He was treated for clinical sepsis but underlying etiology was not clear.  Patient had PET scan on 05/23/2019 revealing resolution of adenopathy that was present during previous PET scan but with recurrence of hypermetabolic lymphadenopathy in the neck, supraclavicular region, retroperitoneum, and left hemipelvis.  Palliative care was consulted help address goals.  SOCIAL HISTORY:     reports that he quit smoking about 8 years ago. His smoking use included cigarettes. He has a 33.00 pack-year smoking history. He quit smokeless tobacco use about 36 years ago.  His smokeless tobacco use included chew. He reports current alcohol use. He reports that he does not use drugs.   Patient is married and lives at home with his wife.  They had a daughter who died in a motor vehicle accident some years ago.  Patient worked as a Dealer.  He also drove race cars for a hobby but quit in 2000.  He appreciates [employer] cars.  ADVANCE DIRECTIVES:  Not on file  CODE STATUS: Full  code  PAST MEDICAL HISTORY: Past Medical History:  Diagnosis Date  . Anxiety   . Arthritis   . Cancer ([place])   . 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 HOH (hard of hearing)   . Hypertension   . Osteomyelitis of toe ([place]) 06/08/11   right foot  . Seasonal allergies   . Type II diabetes mellitus ([place])    diet controlled    PAST SURGICAL HISTORY:  Past Surgical History:  Procedure Laterality Date  . AMPUTATION  06/08/2011   Procedure: AMPUTATION RAY;  Surgeon: Wylene Simmer, MD;  Location: [facility];  Service: Orthopedics;  Laterality: Right;  Righ [place] Amputation  . [place]   "crushed"  . FRACTURE SURGERY    . 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 PILONIDAL CYST / SINUS EXCISION  1970's  . TOE AMPUTATION  06/08/11   partial; right great toe    HEMATOLOGY/ONCOLOGY HISTORY:  Oncology History Overview Note  # October 2019- SMALL CELL LYMPHOMA; ki-67-10%. STAGE- III/ IV; PET scan- bulky LN [Dr.Vaught] above/below Diaphragm; No spleen/liver/bone;   # 22nd October 2019- Bil PE on lovenox xstopped; NOV 2019- Eliquis 5 mg BID. AUG 2020-slight incresae in RP LN ~1cm; continue Ibrutinib;  # worsening Anemia- Jan 2021- Hb 8.5; STOP eliquis; FOBT- positive; referral to GI/pt declines  # DM/HTN/ CKD- stage III -----------------------------------------------   MOLECULAR TESTING: Trisomy 12. Results for  CCND1/IGH, ATM, 13q and TP53 were normal./-IVGH MUTATED    DIAGNOSIS:SLL/CLL  STAGE:  III/IV   ;GOALS: control  CURRENT/MOST RECENT THERAPY : IBRUTINIB 420 mg [Nov 5th  2019]    Small B-cell lymphoma of lymph nodes of multiple regions ([place])  12/27/2017 Initial Diagnosis   Small cell B-cell lymphoma of lymph nodes of multiple sites (HCC)     ALLERGIES:  is allergic to penicillins.  MEDICATIONS:  Current Facility-Administered Medications  Medication Dose Route Frequency Provider Last Rate Last Admin  . 0.9 %  sodium chloride infusion   Intravenous  Continuous Loletha Grayer, MD 75 mL/hr at 05/22/19 2213 New Bag at 05/22/19 2213  . acetaminophen (TYLENOL) tablet 650 mg  650 mg Oral Q6H PRN Ivor Costa, MD   650 mg at 05/22/19 0147  . ascorbic acid (VITAMIN C) tablet 250 mg  250 mg Oral Daily Lu Duffel, RPH   250 mg at 05/22/19 7782  . ceFEPIme (MAXIPIME) 2 g in sodium chloride 0.9 % 100 mL IVPB  2 g Intravenous Q12H Lu Duffel, RPH 200 mL/hr at 05/22/19 2215 2 g at 05/22/19 2215  . feeding supplement (NEPRO CARB STEADY) liquid 237 mL  237 mL Oral TID BM Loletha Grayer, MD   237 mL at 05/22/19 0955  . ferrous sulfate tablet 325 mg  325 mg Oral Daily Ivor Costa, MD   325 mg at 05/22/19 0955  . fluticasone (FLONASE) 50 MCG/ACT nasal spray 2 spray  2 spray Each Nare Daily PRN Ivor Costa, MD      . heparin injection 5,000 Units  5,000 Units Subcutaneous Q8H Ivor Costa, MD   5,000 Units at 05/23/19 [phone]  . hydrALAZINE (APRESOLINE) tablet 25 mg  25 mg Oral TID PRN Ivor Costa, MD      . insulin aspart (novoLOG) injection 0-5 Units  0-5 Units Subcutaneous QHS Ivor Costa, MD      . insulin aspart (novoLOG) injection 0-9 Units  0-9 Units Subcutaneous TID WC Ivor Costa, MD   3 Units at 05/22/19 1840  . metroNIDAZOLE (FLAGYL) tablet 500 mg  500 mg Oral Q8H Leonel Ramsay, MD   500 mg at 05/23/19 3614  . multivitamin with minerals tablet 1 tablet  1 tablet Oral Daily Loletha Grayer, MD   1 tablet at 05/22/19 0955  . ondansetron (ZOFRAN) injection 4 mg  4 mg Intravenous Q8H PRN Ivor Costa, MD      . pravastatin (PRAVACHOL) tablet 40 mg  40 mg Oral QHS Ivor Costa, MD   40 mg at 05/22/19 2216  . tamsulosin (FLOMAX) capsule 0.4 mg  0.4 mg Oral QPC breakfast Loletha Grayer, MD   0.4 mg at 05/23/19 0811    VITAL SIGNS: BP 134/69 (BP Location: Left Arm)   Pulse 90   Temp 98.9 F (37.2 C) (Oral)   Resp 18   Ht '6\' 1"'  (1.854 m)   Wt 168 lb (76.2 kg)   SpO2 100%   BMI 22.16 kg/m  Filed Weights   05/20/19 1015  Weight: 168  lb (76.2 kg)    Estimated body mass index is 22.16 kg/m as calculated from the following:   Height as of this encounter: '6\' 1"'  (1.854 m).   Weight as of this encounter: 168 lb (76.2 kg).  LABS: CBC:    Component Value Date/Time   WBC 7.3 05/23/2019 0537   HGB 8.2 (L) 05/23/2019 0537   HCT 26.9 (L) 05/23/2019 0537   PLT 277 05/23/2019 0537   MCV 72.1 (L) 05/23/2019 0537   NEUTROABS 7.0 05/20/2019 1033   LYMPHSABS 1.5 05/20/2019 1033   MONOABS 0.8 05/20/2019 1033   EOSABS 0.2 05/20/2019 1033  BASOSABS 0.1 05/20/2019 1033   Comprehensive Metabolic Panel:    Component Value Date/Time   NA 137 05/23/2019 0537   K 3.6 05/23/2019 0537   CL 107 05/23/2019 0537   CO2 20 (L) 05/23/2019 0537   BUN 38 (H) 05/23/2019 0537   CREATININE 2.22 (H) 05/23/2019 0537   CREATININE 0.88 11/04/2010 0959   GLUCOSE 119 (H) 05/23/2019 0537   CALCIUM 9.0 05/23/2019 0537   CALCIUM 9.7 05/08/2019 0921   AST 17 05/20/2019 1033   ALT 12 05/20/2019 1033   ALKPHOS 55 05/20/2019 1033   BILITOT 0.5 05/20/2019 1033   PROT 6.7 05/20/2019 1033   ALBUMIN 3.1 (L) 05/20/2019 1033    RADIOGRAPHIC STUDIES: CT ABDOMEN PELVIS WO CONTRAST  Result Date: 05/07/2019 CLINICAL DATA:  Hyperparathyroidism. Hypercalcemia. Small B-cell lymphoma, on immune therapy. Pulmonary emboli in September. Altered mental status and fever. EXAM: CT CHEST, ABDOMEN AND PELVIS WITHOUT CONTRAST TECHNIQUE: Multidetector CT imaging of the chest, abdomen and pelvis was performed following the standard protocol without IV contrast. COMPARISON:  01/24/2019 FINDINGS: CT CHEST FINDINGS Cardiovascular: Multifactorial degradation, including motion in the lower chest, patient arm position (not raised above the head), and lack of IV contrast. Aortic and branch vessel atherosclerosis. Tortuous thoracic aorta. Mild cardiomegaly. Multivessel coronary artery atherosclerosis. Mediastinum/Nodes: Left supraclavicular index node measures 2.3 cm on 07/02,  increased from 1.3 cm on the prior. Low left jugular nodes measure up to 1.8 cm today versus 1.4 cm on the prior. Relatively similar multiple bilateral axillary nodes. Left paratracheal node measures 1.2 cm on 22/2, newly enlarged since the prior. Hilar regions poorly evaluated without intravenous contrast. Lungs/Pleura: No pleural fluid. Right greater than left subpleural pulmonary nodules are similar, likely subpleural lymph nodes. Example at up to 6 mm on 72/4 in the right upper lobe. New superior segment left lower lobe clustered nodular airspace and ground-glass opacity, including on 74/4. Musculoskeletal: Right upper extremity edema and subcutaneous gas are incompletely imaged, including on 38/2. Question intramuscular hematoma, including on 44/2. No acute osseous abnormality. CT ABDOMEN PELVIS FINDINGS Hepatobiliary: Multifactorial degradation continuing into the upper abdomen. Grossly normal noncontrast appearance of the liver, gallbladder. Pancreas: Grossly normal pancreas. Spleen: Normal in size, without focal abnormality. Adrenals/Urinary Tract: Normal adrenal glands. Favor renal vascular calcifications over renal calculi. No hydronephrosis. Bladder wall thickening is mild, at least partially felt to be due to underdistention. Stomach/Bowel: Grossly normal stomach. Colonic stool burden suggests constipation. Normal small bowel caliber. Vascular/Lymphatic: Aortic atherosclerosis. Bulky abdominal adenopathy. Index left periaortic node measures 2.7 x 3.6 cm on 77/2 versus 2.2 x 2.8 cm at the same level on the prior. A dominant left periaortic nodal mass measures maximally 6.7 cm on 93/2 today versus 5.5 cm on the prior exam (when remeasured). Index left external iliac node measures 3.3 cm on 116/2 and is similar to on the prior exam (when remeasured). Reproductive: Moderate prostatomegaly. Other: No significant free fluid. Musculoskeletal: No acute osseous abnormality. Lumbosacral junction degenerative disc  disease. IMPRESSION: 1. Multifactorial degradation, including lack of IV contrast, motion, patient arm position. 2. Since 01/24/2019, development of left lower lobe nodular airspace disease, suspicious for infection or aspiration. 3. Right upper extremity edema and gas with possible intramuscular hematoma. Correlate with recent trauma or attempted IV placement. If no correlate history, deep venous thrombosis of the right upper extremity to would be a consideration and ultrasound suggested. 4. Progressive lymphoma within the neck, chest, abdomen, as detailed above. Pelvic adenopathy is relatively similar. 5. Prostatomegaly. Apparent bladder wall  thickening is at least partially felt to be due to underdistention. Correlate for possible bladder outlet obstruction. 6. Coronary artery atherosclerosis. Aortic Atherosclerosis (ICD10-I70.0). 7.  Possible constipation. Electronically Signed   By: Abigail Miyamoto M.D.   On: 05/07/2019 16:57   DG Chest 1 View  Result Date: 05/20/2019 CLINICAL DATA:  Fever, altered mental status EXAM: CHEST  1 VIEW COMPARISON:  None. FINDINGS: The heart size and mediastinal contours are within normal limits. Both lungs are clear. No pleural effusion or pneumothorax. The visualized skeletal structures are unremarkable. IMPRESSION: No acute process in the chest Electronically Signed   By: Macy Mis M.D.   On: 05/20/2019 10:57   DG Chest 2 View  Result Date: 05/22/2019 CLINICAL DATA:  Fever EXAM: CHEST - 2 VIEW COMPARISON:  May 20, 2019 FINDINGS: Lungs are clear. Heart size and pulmonary vascularity are normal. No adenopathy. There is degenerative change in the lower thoracic spine. There is a skin fold on the right. No appreciable pneumothorax. IMPRESSION: Lungs clear. Cardiac silhouette within normal limits. Apparent skin fold on the right. Electronically Signed   By: Lowella Grip III M.D.   On: 05/22/2019 08:43   CT HEAD WO CONTRAST  Result Date: 05/20/2019 CLINICAL DATA:   Patient with lymphoma undergoing treatment. Cephalopathy. EXAM: CT HEAD WITHOUT CONTRAST TECHNIQUE: Contiguous axial images were obtained from the base of the skull through the vertex without intravenous contrast. 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 Cerebral hemispheres show chronic small-vessel change of the white matter. No sign of acute infarction, mass lesion, hemorrhage, hydrocephalus or extra-axial collection. Vascular: There is atherosclerotic calcification of the major vessels at the base of the brain. Skull: Negative Sinuses/Orbits: Clear/normal Other: None IMPRESSION: No acute finding. Chronic small-vessel change of the cerebral hemispheric white matter. Old small vessel cerebellar infarctions. Electronically Signed   By: Nelson Chimes M.D.   On: 05/20/2019 19:56   CT Head Wo Contrast  Result Date: 05/06/2019 CLINICAL DATA:  Altered mental status, unclear cause EXAM: CT HEAD WITHOUT CONTRAST TECHNIQUE: Contiguous axial images were obtained from the base of the skull through the vertex without intravenous contrast. COMPARISON:  None. 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 No CT evident large vascular territory or cortically based infarction is seen. No evidence of acute, hemorrhage, hydrocephalus, extra-axial collection or mass lesion/mass effect. Patchy areas of white matter hypoattenuation are most compatible with chronic microvascular angiopathy. Symmetric prominence of the ventricles, cisterns and sulci compatible with parenchymal volume loss. Vascular: Atherosclerotic calcification of the carotid siphons and intradural vertebral arteries. No hyperdense vessel. Skull: No calvarial fracture or suspicious osseous lesion. No scalp swelling or hematoma. Vascular calcium of the scalp, often seen in diabetes. Sinuses/Orbits: Paranasal sinuses and mastoid air cells are predominantly  clear. Included orbital structures are unremarkable. Other: None IMPRESSION: 1. Multiple acquisition required due to patient motion artifact. 2. No convincing CT evidence of acute intracranial abnormality. 3. 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 4. Chronic microangiopathic changes with parenchymal volume loss. Intracranial atherosclerosis. Electronically Signed   By: Lovena Le M.D.   On: 05/06/2019 21:15   CT CHEST WO CONTRAST  Result Date: 05/07/2019 CLINICAL DATA:  Hyperparathyroidism. Hypercalcemia. Small B-cell lymphoma, on immune therapy. Pulmonary emboli in September. Altered mental status and fever. EXAM: CT CHEST, ABDOMEN AND PELVIS WITHOUT CONTRAST TECHNIQUE: Multidetector CT imaging of the chest, abdomen and pelvis was performed following the standard protocol  without IV contrast. COMPARISON:  01/24/2019 FINDINGS: CT CHEST FINDINGS Cardiovascular: Multifactorial degradation, including motion in the lower chest, patient arm position (not raised above the head), and lack of IV contrast. Aortic and branch vessel atherosclerosis. Tortuous thoracic aorta. Mild cardiomegaly. Multivessel coronary artery atherosclerosis. Mediastinum/Nodes: Left supraclavicular index node measures 2.3 cm on 07/02, increased from 1.3 cm on the prior. Low left jugular nodes measure up to 1.8 cm today versus 1.4 cm on the prior. Relatively similar multiple bilateral axillary nodes. Left paratracheal node measures 1.2 cm on 22/2, newly enlarged since the prior. Hilar regions poorly evaluated without intravenous contrast. Lungs/Pleura: No pleural fluid. Right greater than left subpleural pulmonary nodules are similar, likely subpleural lymph nodes. Example at up to 6 mm on 72/4 in the right upper lobe. New superior segment left lower lobe clustered nodular airspace and ground-glass opacity, including on 74/4. Musculoskeletal: Right upper extremity edema and subcutaneous gas are incompletely  imaged, including on 38/2. Question intramuscular hematoma, including on 44/2. No acute osseous abnormality. CT ABDOMEN PELVIS FINDINGS Hepatobiliary: Multifactorial degradation continuing into the upper abdomen. Grossly normal noncontrast appearance of the liver, gallbladder. Pancreas: Grossly normal pancreas. Spleen: Normal in size, without focal abnormality. Adrenals/Urinary Tract: Normal adrenal glands. Favor renal vascular calcifications over renal calculi. No hydronephrosis. Bladder wall thickening is mild, at least partially felt to be due to underdistention. Stomach/Bowel: Grossly normal stomach. Colonic stool burden suggests constipation. Normal small bowel caliber. Vascular/Lymphatic: Aortic atherosclerosis. Bulky abdominal adenopathy. Index left periaortic node measures 2.7 x 3.6 cm on 77/2 versus 2.2 x 2.8 cm at the same level on the prior. A dominant left periaortic nodal mass measures maximally 6.7 cm on 93/2 today versus 5.5 cm on the prior exam (when remeasured). Index left external iliac node measures 3.3 cm on 116/2 and is similar to on the prior exam (when remeasured). Reproductive: Moderate prostatomegaly. Other: No significant free fluid. Musculoskeletal: No acute osseous abnormality. Lumbosacral junction degenerative disc disease. IMPRESSION: 1. Multifactorial degradation, including lack of IV contrast, motion, patient arm position. 2. Since 01/24/2019, development of left lower lobe nodular airspace disease, suspicious for infection or aspiration. 3. Right upper extremity edema and gas with possible intramuscular hematoma. Correlate with recent trauma or attempted IV placement. If no correlate history, deep venous thrombosis of the right upper extremity to would be a consideration and ultrasound suggested. 4. Progressive lymphoma within the neck, chest, abdomen, as detailed above. Pelvic adenopathy is relatively similar. 5. Prostatomegaly. Apparent bladder wall thickening is at least partially  felt to be due to underdistention. Correlate for possible bladder outlet obstruction. 6. Coronary artery atherosclerosis. Aortic Atherosclerosis (ICD10-I70.0). 7.  Possible constipation. Electronically Signed   By: Abigail Miyamoto M.D.   On: 05/07/2019 16:57   NM PET Image Restag (PS) Skull Base To Thigh  Result Date: 05/23/2019 CLINICAL DATA:  Subsequent treatment strategy for small B-cell lymphoma, on immune therapy. EXAM: NUCLEAR MEDICINE PET SKULL BASE TO THIGH TECHNIQUE: 9.4 mCi F-18 FDG was injected intravenously. Full-ring PET imaging was performed from the skull base to thigh after the radiotracer. CT data was obtained and used for attenuation correction and anatomic localization. Fasting blood glucose: 123 mg/dl COMPARISON:  01/01/2018 FINDINGS: Mediastinal blood pool activity: SUV max 1.9 Liver activity: SUV max 2.8 NECK: Most of the bulky bilateral Deauville 4 neck adenopathy shown on the PET-CT from 01/01/2018 has resolved, but as noted on recent diagnostic CT examinations, has been a progressive recent increase in left level IV and left level V lymph  nodes including a left level IV node measuring 1.7 cm on image 67/3 with maximum SUV of 11.7 (Deauville 5) and a left level V lymph node measuring 2.1 cm on image 66/3 with maximum SUV 7.3, Deauville 5. Incidental CT findings: Bilateral common carotid atherosclerotic calcification is noted. CHEST: Left supraclavicular adenopathy is present and includes a 1.6 cm node on image 75/3 with maximum SUV 9.4 (Deauville 5). On prior PET-CT this node had a maximum SUV of 3.6. Although the bulky axillary adenopathy shown on the prior chest CT has mostly resolved, there are some residual axillary lymph nodes including a 1.1 cm right axillary node on image 94/3 with maximum SUV 1.7, Deauville 2. Incidental CT findings: Coronary, aortic arch, and branch vessel atherosclerotic vascular disease. Mild cardiomegaly. Trace bilateral pleural effusions. There is some new  reticulonodular and patchy airspace opacity medially in the right lower lobe on image 118/3, not present on 05/07/2019. On the other hand, some of the airspace opacity in the left lower lobe has improved, with residual nodular sub solid densities in the left lower lobe but with the more consolidative region shown earlier this month resolved. The appearance could be from aspiration pneumonitis or atypical pneumonia. ABDOMEN/PELVIS: Bulky Deauville 5 periaortic/retroperitoneal adenopathy extending down into the left common iliac and external iliac chains. An index left periaortic lymph node measuring 5.8 cm in short axis on image 219/3 (previously 5.2 cm on 01/01/2018) has a maximum SUV of 14.9, Deauville 5 (formerly 10.5, likewise Deauville 5). However, many of the other periaortic lymph nodes shown on the prior PET-CT were Deauville 4 previously, and are now Deauville 5. A left external iliac/pelvic sidewall node measures 3.6 cm in short axis on image 260/3 (formerly 6.5 cm) with maximum SUV 9.3, Deauville 5 (formerly 7.6). The previously bulky inguinal adenopathy has resolved. The previously bulky right iliac adenopathy has resolved. No splenomegaly or abnormal splenic activity. Incidental CT findings: Aortoiliac atherosclerotic vascular disease. Mesenteric, retroperitoneal, and omental infiltrative edema. There is an air-level in the rectum which may indicate a diarrheal process. Large left scrotal hydrocele. Prostatomegaly. SKELETON: Left antecubital activity is injection related. No abnormal skeletal activity is observed. Incidental CT findings: Old healed right anterior rib fractures. Cervical, thoracic, and lumbar spondylosis. IMPRESSION: 1. Although much of the bulky adenopathy in the neck, chest, and abdomen/pelvis has resolved compared to the prior PET-CT of 01/01/2018, today's exam and interval CT exams reveal that there is recurrent adenopathy in the left lower neck, left supraclavicular region, in the  retroperitoneum, and in the left hemipelvis which is higher in metabolic activity than that shown on prior PET-CT (currently Deauville 5, previously ranging from Deauville 2 through Deauville 5). 2. There is some scattered residual axillary lymph nodes, currently Deauville 2 activity. 3. Patchy airspace opacities in the lower lobes, increased on the right but reduced on the left, possibly from atypical pneumonia or aspiration pneumonitis. 4. Air fluid level in the rectum favoring diarrheal process. 5. Other imaging findings of potential clinical significance: Aortic Atherosclerosis (ICD10-I70.0). Coronary atherosclerosis. Mild cardiomegaly. Trace bilateral pleural effusions. Mesenteric edema. Large left scrotal hydrocele. Prostatomegaly. Electronically Signed   By: Van Clines M.D.   On: 05/23/2019 13:25   US Venous Img Upper Uni Right(DVT)  Result Date: 05/21/2019 CLINICAL DATA:  69 year old with right upper extremity swelling. EXAM: RIGHT UPPER EXTREMITY VENOUS DOPPLER ULTRASOUND TECHNIQUE: Gray-scale sonography with graded compression, as well as color Doppler and duplex ultrasound were performed to evaluate the upper extremity deep venous system from the  level of the subclavian vein and including the jugular, axillary, basilic, radial, ulnar and upper cephalic vein. Spectral Doppler was utilized to evaluate flow at rest and with distal augmentation maneuvers. COMPARISON:  None. FINDINGS: Contralateral Subclavian Vein: Normal color Doppler flow and phasicity. Internal Jugular Vein: No evidence of thrombus. Normal compressibility, color Doppler flow and phasicity. Subclavian Vein: No evidence of thrombus. Normal color Doppler flow and phasicity. Axillary Vein: No evidence of thrombus. Normal compressibility, color Doppler flow and augmentation. Cephalic Vein: No evidence of thrombus. Normal compressibility, color Doppler flow and augmentation. Basilic Vein: No evidence of thrombus. Normal  compressibility, color Doppler flow and augmentation. Brachial Veins: No evidence of thrombus. Normal compressibility, color Doppler flow and augmentation. Radial Veins: No evidence of thrombus. Normal compressibility and color Doppler flow. Ulnar Veins: No evidence of thrombus. Normal compressibility and color Doppler flow. Other Findings: No discrete abnormality at the area of concern in the right forearm. IMPRESSION: No evidence of DVT within the right upper extremity. Electronically Signed   By: Markus Daft M.D.   On: 05/21/2019 20:46   DG Chest Portable 1 View  Result Date: 05/06/2019 CLINICAL DATA:  Fevers and altered mental status, history of lymphoma EXAM: PORTABLE CHEST 1 VIEW COMPARISON:  01/24/2019 CT, plain film from 11/13/2017 FINDINGS: Cardiac shadow is within normal limits. The lungs are well aerated bilaterally. No focal infiltrate or sizable effusion is seen. Aortic calcifications are noted. No acute bony abnormality is noted. Exuberant calcification at the first costochondral margins is noted bilaterally. IMPRESSION: No acute abnormality noted. Electronically Signed   By: Inez Catalina M.D.   On: 05/06/2019 22:18    PERFORMANCE STATUS (ECOG) : 3 - Symptomatic, >50% confined to bed  Review of Systems Unless otherwise noted, a complete review of systems is negative.  Physical Exam General: NAD Pulmonary: Unlabored Abdomen: soft, nontender, + bowel sounds GU: no suprapubic tenderness Extremities: Left BKA Skin: no rashes Neurological: Weakness but otherwise nonfocal  IMPRESSION: I met with patient and his wife following his PET scan today.  Note that communication is challenged by patient's severe hearing deficit.  Additionally, patient is at times tangential and irritable.  Patient's wife spoke with Dr. Rogue Bussing earlier today regarding options for possible treatment in the event of progressive disease shown on PET scan.  Likely patient would have to rotate to systemic  chemotherapy.  Wife is unsure if patient would be willing to go through that as he has expressed extreme displeasure to medical care recently.  Patient had told her that he did not even want the PET scan and would prefer just to have died but he eventually acquiesced.  I attempted to have this conversation with patient today to elucidate his goals but patient was undecided.  I think that this will have to be discussed in more detail once patient's acute issues have resolved and likely he can be seen in the clinic.  We also discussed CODE STATUS.  Patient's wife does not think that he would want to be resuscitated nor have his life prolonged artificially on machines.  However, she will have to address this with him when he is better able to participate in a conversation regarding his goals.  Patient's wife is hopeful that he will be able to discharge home.  However, she is concerned that he will need increased care.  She does not think that patient would tolerate or agreed to SNF.  Would recommend home health involvement at time of discharge.  Could also consult  home-based palliative care.  Would recommend hospice involvement if patient/wife decided to forego treatment.  Symptomatically, patient says that he was unable to sleep last night.  Discussed with Dr. Earleen Newport who plans to trial Seroquel tonight.  Would also recommend consideration of an SSRI.  Discussed with wife and she would like to try.  PLAN: -Continue current scope of treatment -Wife plans to speak with patient in more depth regarding his goals for treatment and CODE STATUS -Agree with Seroquel nightly -Would also recommend initiation of an SSRI -Dispo: Will probably need home health involvement -Will plan to follow in the clinic   Time Total: 60 minutes  Visit consisted of counseling and education dealing with the complex and emotionally intense issues of symptom management and palliative care in the setting of serious and  potentially life-threatening illness.Greater than 50%  of this time was spent counseling and coordinating care related to the above assessment and plan.  Signed by: Altha Harm, PhD, NP-C

## 2019-05-23 NOTE — Care Management Important Message (Signed)
Important Message  Patient Details  Name: [name] MRN: [id number] Date of Birth: [birth year]   Medicare Important Message Given:  Yes     Dannette Barbara 05/23/2019, 1:51 PM

## 2019-05-23 NOTE — Telephone Encounter (Signed)
Spoke to wife- [phone]; long discussion re: over al poor prognosis- given hypecalemia/ fevers/ non-cooperation. Recommend palliative care evaluation. Wife worried about taking care of him at home.

## 2019-05-24 ENCOUNTER — Inpatient Hospital Stay: Admit: 2019-05-24 | Payer: Medicare Other

## 2019-05-24 DIAGNOSIS — D500 Iron deficiency anemia secondary to blood loss (chronic): Secondary | ICD-10-CM

## 2019-05-24 DIAGNOSIS — J181 Lobar pneumonia, unspecified organism: Secondary | ICD-10-CM

## 2019-05-24 DIAGNOSIS — R338 Other retention of urine: Secondary | ICD-10-CM

## 2019-05-24 DIAGNOSIS — R39198 Other difficulties with micturition: Secondary | ICD-10-CM

## 2019-05-24 DIAGNOSIS — N189 Chronic kidney disease, unspecified: Secondary | ICD-10-CM

## 2019-05-24 LAB — CBC
HCT: 25.4 % — ABNORMAL LOW (ref 39.0–52.0)
Hemoglobin: 7.5 g/dL — ABNORMAL LOW (ref 13.0–17.0)
MCH: 21.5 pg — ABNORMAL LOW (ref 26.0–34.0)
MCHC: 29.5 g/dL — ABNORMAL LOW (ref 30.0–36.0)
MCV: 72.8 fL — ABNORMAL LOW (ref 80.0–100.0)
Platelets: 314 10*3/uL (ref 150–400)
RBC: 3.49 MIL/uL — ABNORMAL LOW (ref 4.22–5.81)
RDW: 19.1 % — ABNORMAL HIGH (ref 11.5–15.5)
WBC: 5.2 10*3/uL (ref 4.0–10.5)
nRBC: 0 % (ref 0.0–0.2)

## 2019-05-24 LAB — BASIC METABOLIC PANEL
Anion gap: 9 (ref 5–15)
BUN: 35 mg/dL — ABNORMAL HIGH (ref 8–23)
CO2: 20 mmol/L — ABNORMAL LOW (ref 22–32)
Calcium: 8.8 mg/dL — ABNORMAL LOW (ref 8.9–10.3)
Chloride: 109 mmol/L (ref 98–111)
Creatinine, Ser: 2.29 mg/dL — ABNORMAL HIGH (ref 0.61–1.24)
GFR calc Af Amer: 33 mL/min — ABNORMAL LOW (ref >60)
GFR calc non Af Amer: 28 mL/min — ABNORMAL LOW (ref >60)
Glucose, Bld: 153 mg/dL — ABNORMAL HIGH (ref 70–99)
Potassium: 3 mmol/L — ABNORMAL LOW (ref 3.5–5.1)
Sodium: 138 mmol/L (ref 135–145)

## 2019-05-24 LAB — GLUCOSE, CAPILLARY
Glucose-Capillary: 147 mg/dL — ABNORMAL HIGH (ref 70–99)
Glucose-Capillary: 243 mg/dL — ABNORMAL HIGH (ref 70–99)

## 2019-05-24 LAB — CRYPTOCOCCUS ANTIGEN, SERUM: Cryptococcus Antigen, Serum: NEGATIVE

## 2019-05-24 MED ORDER — NEPRO/CARBSTEADY PO LIQD: 237.0000 mL | Freq: Three times a day (TID) | ORAL | 0 refills | Status: AC

## 2019-05-24 MED ORDER — METRONIDAZOLE 500 MG PO TABS: 500.0000 mg | ORAL_TABLET | Freq: Three times a day (TID) | ORAL | 0 refills | Status: AC

## 2019-05-24 MED ORDER — TAMSULOSIN HCL 0.4 MG PO CAPS: 0.4000 mg | ORAL_CAPSULE | Freq: Every day | ORAL | 0 refills | Status: AC

## 2019-05-24 MED ORDER — CEFDINIR 300 MG PO CAPS: 300.0000 mg | ORAL_CAPSULE | Freq: Two times a day (BID) | ORAL | 0 refills | Status: AC

## 2019-05-24 MED ORDER — SODIUM CHLORIDE 0.9 % IV SOLN
INTRAVENOUS | Status: DC | PRN
  Administered 2019-05-24: 50 mL via INTRAVENOUS

## 2019-05-24 NOTE — TOC Transition Note (Signed)
Transition of Care [facility]) - CM/SW Discharge Note   Patient Details  Name: [name] MRN: [id number] Date of Birth: December 14, 1950  [facility]) CM/SW Contact:  Marshell Garfinkel, RN Phone Number: 05/24/2019, 2:02 PM   Clinical Narrative:     Patient is having difficulty voiding and may need urinary craterization at home. I have asked MD to add home health nursing to his current treatment plan. [facility]. [name] has been updated. Wife has been updated.   Final next level of care: Home w Home Health Services Barriers to Discharge: Continued Medical Work up   Patient Goals and CMS Choice   CMS Medicare.gov Compare Post Acute Care list provided to:: Patient Choice offered to / list presented to : Patient, Spouse  Discharge Placement                       Discharge Plan and Services                          HH Arranged: RN, PT [facility] Agency: [place] ([place] Date [place]: 05/24/19 Time [place]: [id number] Representative spoke with at [place]: [place] (SDOH) Interventions     Readmission Risk Interventions No flowsheet data found.

## 2019-05-24 NOTE — Progress Notes (Signed)
Hematology/Oncology Progress Note [facility] [phone] [phone]  Patient Care Team: [name], MD as PCP - General (Internal Medicine)   Name of the patient: [name]  ME:[id number]  [birth year]  Date of visit: 05/24/19   INTERVAL HISTORY-  Wife at the bedside.  Patient has no new complaints.  Hearing loss.Most history obtained from wife. Patient is having difficulty voiding and Flomax is added. There is a plan for patient to be discharged this afternoon if patient has no difficulty passing urine.     Review of systems- Review of Systems  Unable to perform ROS: Acuity of condition  Constitutional: Positive for fatigue.  HENT:   Positive for hearing loss.   Respiratory: Negative for cough.   Cardiovascular: Negative for chest pain.  Gastrointestinal: Negative for abdominal pain.  Genitourinary: Positive for difficulty urinating.     Allergies  Allergen Reactions  . Penicillins Anaphylaxis and Other (See Comments)    Has patient had a PCN reaction causing immediate rash, facial/tongue/throat swelling, SOB or lightheadedness with hypotension: Unknown Has patient had a PCN reaction causing severe rash involving mucus membranes or skin necrosis: Unknown Has patient had a PCN reaction that required hospitalization: Unknown Has patient had a PCN reaction occurring within the last 10 years: No If all of the above answers are "NO", then may proceed with Cephalosporin use.     Patient Active Problem List   Diagnosis Date Noted  . Palliative care encounter   . Weakness   . Protein-calorie malnutrition, severe 05/21/2019  . Acute metabolic encephalopathy [id number]  . Sepsis ([place]) 05/20/2019  . Elevated troponin 05/20/2019  . Hypercalcemia of malignancy 05/06/2019  . AMS (altered mental status) 05/06/2019  . Iron deficiency anemia due to chronic blood loss 02/24/2019  . 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 Bilateral pulmonary embolism ([place]) 12/25/2017  . Acute kidney injury superimposed on CKD ([place]) 12/25/2017  . Thoracic lymphadenopathy 12/25/2017  . PAD (peripheral artery disease) ([place]) 10/23/2011  . HLD (hyperlipidemia) 07/13/2011  . Osteomyelitis of toe of right foot ([place]) 06/26/2011  . 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 Type II diabetes mellitus with renal manifestations ([place]) 01/26/2009  . Essential hypertension 01/26/2009  . Below-knee amputation ([place]) 01/26/2009     Past Medical History:  Diagnosis Date  . Anxiety   . Arthritis   . Cancer ([place])   . 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 HOH (hard of hearing)   . Hypertension   . Osteomyelitis of toe ([place]) 06/08/11   right foot  . Seasonal allergies   . Type II diabetes mellitus ([place])    diet controlled     Past Surgical History:  Procedure Laterality Date  . AMPUTATION  06/08/2011   Procedure: AMPUTATION RAY;  Surgeon: Wylene Simmer, MD;  Location: [facility];  Service: Orthopedics;  Laterality: Right;  Righ Hallux Amputation  . [place]   "crushed"  . FRACTURE SURGERY    . 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 PILONIDAL CYST / SINUS EXCISION  1970's  . TOE AMPUTATION  06/08/11   partial; right great toe    Social History   Socioeconomic History  . Marital status: Married    Spouse name: Not on file  . Number of children: Not on file  . Years of education: Not on file  . Highest education level: Not on  file  Occupational History  . Not on file  Tobacco Use  . Smoking status: Former Smoker    Packs/day: 1.50    Years: 22.00    Pack years: 33.00    Types: Cigarettes    Quit date: 05/24/2011    Years since quitting: 8.0  . Smokeless tobacco: Former Systems developer    Types: [place] date: 03/07/1983  Substance and Sexual Activity  . Alcohol use: Yes    Comment: 06/08/11 "no liquor for 2 1/2 years; last beer 05/31/11"  . Drug use: No  . Sexual activity:  Yes  Other Topics Concern  . Not on file  Social History Narrative   Married   Social Determinants of Health   Financial Resource Strain:   . Difficulty of Paying Living Expenses:   Food Insecurity:   . Worried About Charity fundraiser in the Last Year:   . Arboriculturist in the Last Year:   Transportation Needs:   . Film/video editor (Medical):   Marland Kitchen Lack of Transportation (Non-Medical):   Physical Activity:   . Days of Exercise per Week:   . Minutes of Exercise per Session:   Stress:   . Feeling of Stress :   Social Connections:   . Frequency of Communication with Friends and Family:   . Frequency of Social Gatherings with Friends and Family:   . Attends Religious Services:   . Active Member of Clubs or Organizations:   . Attends Archivist Meetings:   Marland Kitchen Marital Status:   Intimate Partner Violence:   . Fear of Current or Ex-Partner:   . Emotionally Abused:   Marland Kitchen Physically Abused:   . Sexually Abused:      Family History  Problem Relation Age of Onset  . Prostate cancer Father   . Hypertension Father   . Other Mother        VARICOSE VEINS     Current Facility-Administered Medications:  .  0.9 %  sodium chloride infusion, , Intravenous, PRN, Loletha Grayer, MD, Last Rate: 10 mL/hr at 05/24/19 1126, 50 mL at 05/24/19 1126 .  acetaminophen (TYLENOL) tablet 650 mg, 650 mg, Oral, Q6H PRN, Ivor Costa, MD, 650 mg at 05/22/19 0147 .  ascorbic acid (VITAMIN C) tablet 250 mg, 250 mg, Oral, Daily, Lu Duffel, RPH, 250 mg at 05/24/19 0810 .  ceFEPIme (MAXIPIME) 2 g in sodium chloride 0.9 % 100 mL IVPB, 2 g, Intravenous, Q12H, Shanlever, Pierce Crane, RPH, Last Rate: 200 mL/hr at 05/24/19 1129, 2 g at 05/24/19 1129 .  feeding supplement (NEPRO CARB STEADY) liquid 237 mL, 237 mL, Oral, TID BM, Wieting, Richard, MD, Last Rate: 0 mL/hr at 05/24/19 0418, 237 mL at 05/24/19 1130 .  ferrous sulfate tablet 325 mg, 325 mg, Oral, Daily, Ivor Costa, MD, 325 mg at  05/24/19 0810 .  fluticasone (FLONASE) 50 MCG/ACT nasal spray 2 spray, 2 spray, Each Nare, Daily PRN, Ivor Costa, MD .  heparin injection 5,000 Units, 5,000 Units, Subcutaneous, Q8H, Ivor Costa, MD, 5,000 Units at 05/24/19 [id number] .  hydrALAZINE (APRESOLINE) tablet 25 mg, 25 mg, Oral, TID PRN, Ivor Costa, MD .  insulin aspart (novoLOG) injection 0-5 Units, 0-5 Units, Subcutaneous, QHS, Niu, Xilin, MD .  insulin aspart (novoLOG) injection 0-9 Units, 0-9 Units, Subcutaneous, TID WC, Ivor Costa, MD, 3 Units at 05/24/19 1310 .  metroNIDAZOLE (FLAGYL) tablet 500 mg, 500 mg, Oral, Q8H, Leonel Ramsay, MD, 500 mg at 05/24/19  1531 .  multivitamin with minerals tablet 1 tablet, 1 tablet, Oral, Daily, Loletha Grayer, MD, 1 tablet at 05/24/19 0810 .  ondansetron (ZOFRAN) injection 4 mg, 4 mg, Intravenous, Q8H PRN, Ivor Costa, MD .  pravastatin (PRAVACHOL) tablet 40 mg, 40 mg, Oral, QHS, Ivor Costa, MD, 40 mg at 05/23/19 2111 .  QUEtiapine (SEROQUEL) tablet 25 mg, 25 mg, Oral, QHS, Wieting, Richard, MD, 25 mg at 05/23/19 2111 .  tamsulosin (FLOMAX) capsule 0.4 mg, 0.4 mg, Oral, QPC breakfast, Leslye Peer, Richard, MD, 0.4 mg at 05/24/19 0810   Physical exam:  Vitals:   05/23/19 1512 05/23/19 2038 05/24/19 0416 05/24/19 0416  BP: (!) 110/57 (!) 155/75 133/63 133/63  Pulse: 96 93 89 82  Resp:  20 20 20   Temp: (!) 97.4 F (36.3 C) 98.2 F (36.8 C) 98.1 F (36.7 C) 98.1 F (36.7 C)  TempSrc: Oral Oral Oral Oral  SpO2: 100% 99% 96% 96%  Weight:      Height:       Physical Exam  Constitutional: No distress.  HENT:  Head: Normocephalic and atraumatic.  Eyes: No scleral icterus.  Cardiovascular: Normal rate.  Pulmonary/Chest: Effort normal. No respiratory distress.  Abdominal: Soft.  Musculoskeletal:        General: No edema.     Cervical back: Normal range of motion.     Comments: Left below-knee amputation, with prosthesis leg  Neurological: He is alert.  Orientated x 1  Skin: Skin is dry.         CMP Latest Ref Rng & Units 05/24/2019  Glucose 70 - 99 mg/dL 153(H)  BUN 8 - 23 mg/dL 35(H)  Creatinine 0.61 - 1.24 mg/dL 2.29(H)  Sodium 135 - 145 mmol/L 138  Potassium 3.5 - 5.1 mmol/L 3.0(L)  Chloride 98 - 111 mmol/L 109  CO2 22 - 32 mmol/L 20(L)  Calcium 8.9 - 10.3 mg/dL 8.8(L)  Total Protein 6.5 - 8.1 g/dL -  Total Bilirubin 0.3 - 1.2 mg/dL -  Alkaline Phos 38 - 126 U/L -  AST 15 - 41 U/L -  ALT 0 - 44 U/L -   CBC Latest Ref Rng & Units 05/24/2019  WBC 4.0 - 10.5 K/uL 5.2  Hemoglobin 13.0 - 17.0 g/dL 7.5(L)  Hematocrit 39.0 - 52.0 % 25.4(L)  Platelets 150 - 400 K/uL 314    RADIOGRAPHIC STUDIES: I have personally reviewed the radiological images as listed and agreed with the findings in the report. CT ABDOMEN PELVIS WO CONTRAST  Result Date: 05/07/2019 CLINICAL DATA:  Hyperparathyroidism. Hypercalcemia. Small B-cell lymphoma, on immune therapy. Pulmonary emboli in September. Altered mental status and fever. EXAM: CT CHEST, ABDOMEN AND PELVIS WITHOUT CONTRAST TECHNIQUE: Multidetector CT imaging of the chest, abdomen and pelvis was performed following the standard protocol without IV contrast. COMPARISON:  01/24/2019 FINDINGS: CT CHEST FINDINGS Cardiovascular: Multifactorial degradation, including motion in the lower chest, patient arm position (not raised above the head), and lack of IV contrast. Aortic and branch vessel atherosclerosis. Tortuous thoracic aorta. Mild cardiomegaly. Multivessel coronary artery atherosclerosis. Mediastinum/Nodes: Left supraclavicular index node measures 2.3 cm on 07/02, increased from 1.3 cm on the prior. Low left jugular nodes measure up to 1.8 cm today versus 1.4 cm on the prior. Relatively similar multiple bilateral axillary nodes. Left paratracheal node measures 1.2 cm on 22/2, newly enlarged since the prior. Hilar regions poorly evaluated without intravenous contrast. Lungs/Pleura: No pleural fluid. Right greater than left subpleural pulmonary  nodules are similar, likely subpleural lymph nodes. Example at  up to 6 mm on 72/4 in the right upper lobe. New superior segment left lower lobe clustered nodular airspace and ground-glass opacity, including on 74/4. Musculoskeletal: Right upper extremity edema and subcutaneous gas are incompletely imaged, including on 38/2. Question intramuscular hematoma, including on 44/2. No acute osseous abnormality. CT ABDOMEN PELVIS FINDINGS Hepatobiliary: Multifactorial degradation continuing into the upper abdomen. Grossly normal noncontrast appearance of the liver, gallbladder. Pancreas: Grossly normal pancreas. Spleen: Normal in size, without focal abnormality. Adrenals/Urinary Tract: Normal adrenal glands. Favor renal vascular calcifications over renal calculi. No hydronephrosis. Bladder wall thickening is mild, at least partially felt to be due to underdistention. Stomach/Bowel: Grossly normal stomach. Colonic stool burden suggests constipation. Normal small bowel caliber. Vascular/Lymphatic: Aortic atherosclerosis. Bulky abdominal adenopathy. Index left periaortic node measures 2.7 x 3.6 cm on 77/2 versus 2.2 x 2.8 cm at the same level on the prior. A dominant left periaortic nodal mass measures maximally 6.7 cm on 93/2 today versus 5.5 cm on the prior exam (when remeasured). Index left external iliac node measures 3.3 cm on 116/2 and is similar to on the prior exam (when remeasured). Reproductive: Moderate prostatomegaly. Other: No significant free fluid. Musculoskeletal: No acute osseous abnormality. Lumbosacral junction degenerative disc disease. IMPRESSION: 1. Multifactorial degradation, including lack of IV contrast, motion, patient arm position. 2. Since 01/24/2019, development of left lower lobe nodular airspace disease, suspicious for infection or aspiration. 3. Right upper extremity edema and gas with possible intramuscular hematoma. Correlate with recent trauma or attempted IV placement. If no correlate  history, deep venous thrombosis of the right upper extremity to would be a consideration and ultrasound suggested. 4. Progressive lymphoma within the neck, chest, abdomen, as detailed above. Pelvic adenopathy is relatively similar. 5. Prostatomegaly. Apparent bladder wall thickening is at least partially felt to be due to underdistention. Correlate for possible bladder outlet obstruction. 6. Coronary artery atherosclerosis. Aortic Atherosclerosis (ICD10-I70.0). 7.  Possible constipation. Electronically Signed   By: Abigail Miyamoto M.D.   On: 05/07/2019 16:57   DG Chest 1 View  Result Date: 05/20/2019 CLINICAL DATA:  Fever, altered mental status EXAM: CHEST  1 VIEW COMPARISON:  None. FINDINGS: The heart size and mediastinal contours are within normal limits. Both lungs are clear. No pleural effusion or pneumothorax. The visualized skeletal structures are unremarkable. IMPRESSION: No acute process in the chest Electronically Signed   By: Macy Mis M.D.   On: 05/20/2019 10:57   DG Chest 2 View  Result Date: 05/22/2019 CLINICAL DATA:  Fever EXAM: CHEST - 2 VIEW COMPARISON:  May 20, 2019 FINDINGS: Lungs are clear. Heart size and pulmonary vascularity are normal. No adenopathy. There is degenerative change in the lower thoracic spine. There is a skin fold on the right. No appreciable pneumothorax. IMPRESSION: Lungs clear. Cardiac silhouette within normal limits. Apparent skin fold on the right. Electronically Signed   By: Lowella Grip III M.D.   On: 05/22/2019 08:43   CT HEAD WO CONTRAST  Result Date: 05/20/2019 CLINICAL DATA:  Patient with lymphoma undergoing treatment. Cephalopathy. EXAM: CT HEAD WITHOUT CONTRAST TECHNIQUE: Contiguous axial images were obtained from the base of the skull through the vertex without intravenous contrast. 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 Cerebral hemispheres show chronic small-vessel change of the white  matter. No sign of acute infarction, mass lesion, hemorrhage, hydrocephalus or extra-axial collection. Vascular: There is atherosclerotic calcification of the major vessels at the base of the brain. Skull: Negative Sinuses/Orbits: Clear/normal Other: None  IMPRESSION: No acute finding. Chronic small-vessel change of the cerebral hemispheric white matter. Old small vessel cerebellar infarctions. Electronically Signed   By: Nelson Chimes M.D.   On: 05/20/2019 19:56   CT Head Wo Contrast  Result Date: 05/06/2019 CLINICAL DATA:  Altered mental status, unclear cause EXAM: CT HEAD WITHOUT CONTRAST TECHNIQUE: Contiguous axial images were obtained from the base of the skull through the vertex without intravenous contrast. COMPARISON:  None. 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 No CT evident large vascular territory or cortically based infarction is seen. No evidence of acute, hemorrhage, hydrocephalus, extra-axial collection or mass lesion/mass effect. Patchy areas of white matter hypoattenuation are most compatible with chronic microvascular angiopathy. Symmetric prominence of the ventricles, cisterns and sulci compatible with parenchymal volume loss. Vascular: Atherosclerotic calcification of the carotid siphons and intradural vertebral arteries. No hyperdense vessel. Skull: No calvarial fracture or suspicious osseous lesion. No scalp swelling or hematoma. Vascular calcium of the scalp, often seen in diabetes. Sinuses/Orbits: Paranasal sinuses and mastoid air cells are predominantly clear. Included orbital structures are unremarkable. Other: None IMPRESSION: 1. Multiple acquisition required due to patient motion artifact. 2. No convincing CT evidence of acute intracranial abnormality. 3. 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 4. Chronic microangiopathic changes with parenchymal volume loss. Intracranial  atherosclerosis. Electronically Signed   By: Lovena Le M.D.   On: 05/06/2019 21:15   CT CHEST WO CONTRAST  Result Date: 05/07/2019 CLINICAL DATA:  Hyperparathyroidism. Hypercalcemia. Small B-cell lymphoma, on immune therapy. Pulmonary emboli in September. Altered mental status and fever. EXAM: CT CHEST, ABDOMEN AND PELVIS WITHOUT CONTRAST TECHNIQUE: Multidetector CT imaging of the chest, abdomen and pelvis was performed following the standard protocol without IV contrast. COMPARISON:  01/24/2019 FINDINGS: CT CHEST FINDINGS Cardiovascular: Multifactorial degradation, including motion in the lower chest, patient arm position (not raised above the head), and lack of IV contrast. Aortic and branch vessel atherosclerosis. Tortuous thoracic aorta. Mild cardiomegaly. Multivessel coronary artery atherosclerosis. Mediastinum/Nodes: Left supraclavicular index node measures 2.3 cm on 07/02, increased from 1.3 cm on the prior. Low left jugular nodes measure up to 1.8 cm today versus 1.4 cm on the prior. Relatively similar multiple bilateral axillary nodes. Left paratracheal node measures 1.2 cm on 22/2, newly enlarged since the prior. Hilar regions poorly evaluated without intravenous contrast. Lungs/Pleura: No pleural fluid. Right greater than left subpleural pulmonary nodules are similar, likely subpleural lymph nodes. Example at up to 6 mm on 72/4 in the right upper lobe. New superior segment left lower lobe clustered nodular airspace and ground-glass opacity, including on 74/4. Musculoskeletal: Right upper extremity edema and subcutaneous gas are incompletely imaged, including on 38/2. Question intramuscular hematoma, including on 44/2. No acute osseous abnormality. CT ABDOMEN PELVIS FINDINGS Hepatobiliary: Multifactorial degradation continuing into the upper abdomen. Grossly normal noncontrast appearance of the liver, gallbladder. Pancreas: Grossly normal pancreas. Spleen: Normal in size, without focal abnormality.  Adrenals/Urinary Tract: Normal adrenal glands. Favor renal vascular calcifications over renal calculi. No hydronephrosis. Bladder wall thickening is mild, at least partially felt to be due to underdistention. Stomach/Bowel: Grossly normal stomach. Colonic stool burden suggests constipation. Normal small bowel caliber. Vascular/Lymphatic: Aortic atherosclerosis. Bulky abdominal adenopathy. Index left periaortic node measures 2.7 x 3.6 cm on 77/2 versus 2.2 x 2.8 cm at the same level on the prior. A dominant left periaortic nodal mass measures maximally 6.7 cm on 93/2 today versus 5.5 cm on the prior exam (when remeasured). Index  left external iliac node measures 3.3 cm on 116/2 and is similar to on the prior exam (when remeasured). Reproductive: Moderate prostatomegaly. Other: No significant free fluid. Musculoskeletal: No acute osseous abnormality. Lumbosacral junction degenerative disc disease. IMPRESSION: 1. Multifactorial degradation, including lack of IV contrast, motion, patient arm position. 2. Since 01/24/2019, development of left lower lobe nodular airspace disease, suspicious for infection or aspiration. 3. Right upper extremity edema and gas with possible intramuscular hematoma. Correlate with recent trauma or attempted IV placement. If no correlate history, deep venous thrombosis of the right upper extremity to would be a consideration and ultrasound suggested. 4. Progressive lymphoma within the neck, chest, abdomen, as detailed above. Pelvic adenopathy is relatively similar. 5. Prostatomegaly. Apparent bladder wall thickening is at least partially felt to be due to underdistention. Correlate for possible bladder outlet obstruction. 6. Coronary artery atherosclerosis. Aortic Atherosclerosis (ICD10-I70.0). 7.  Possible constipation. Electronically Signed   By: Abigail Miyamoto M.D.   On: 05/07/2019 16:57   NM PET Image Restag (PS) Skull Base To Thigh  Result Date: 05/23/2019 CLINICAL DATA:  Subsequent  treatment strategy for small B-cell lymphoma, on immune therapy. EXAM: NUCLEAR MEDICINE PET SKULL BASE TO THIGH TECHNIQUE: 9.4 mCi F-18 FDG was injected intravenously. Full-ring PET imaging was performed from the skull base to thigh after the radiotracer. CT data was obtained and used for attenuation correction and anatomic localization. Fasting blood glucose: 123 mg/dl COMPARISON:  01/01/2018 FINDINGS: Mediastinal blood pool activity: SUV max 1.9 Liver activity: SUV max 2.8 NECK: Most of the bulky bilateral Deauville 4 neck adenopathy shown on the PET-CT from 01/01/2018 has resolved, but as noted on recent diagnostic CT examinations, has been a progressive recent increase in left level IV and left level V lymph nodes including a left level IV node measuring 1.7 cm on image 67/3 with maximum SUV of 11.7 (Deauville 5) and a left level V lymph node measuring 2.1 cm on image 66/3 with maximum SUV 7.3, Deauville 5. Incidental CT findings: Bilateral common carotid atherosclerotic calcification is noted. CHEST: Left supraclavicular adenopathy is present and includes a 1.6 cm node on image 75/3 with maximum SUV 9.4 (Deauville 5). On prior PET-CT this node had a maximum SUV of 3.6. Although the bulky axillary adenopathy shown on the prior chest CT has mostly resolved, there are some residual axillary lymph nodes including a 1.1 cm right axillary node on image 94/3 with maximum SUV 1.7, Deauville 2. Incidental CT findings: Coronary, aortic arch, and branch vessel atherosclerotic vascular disease. Mild cardiomegaly. Trace bilateral pleural effusions. There is some new reticulonodular and patchy airspace opacity medially in the right lower lobe on image 118/3, not present on 05/07/2019. On the other hand, some of the airspace opacity in the left lower lobe has improved, with residual nodular sub solid densities in the left lower lobe but with the more consolidative region shown earlier this month resolved. The appearance could  be from aspiration pneumonitis or atypical pneumonia. ABDOMEN/PELVIS: Bulky Deauville 5 periaortic/retroperitoneal adenopathy extending down into the left common iliac and external iliac chains. An index left periaortic lymph node measuring 5.8 cm in short axis on image 219/3 (previously 5.2 cm on 01/01/2018) has a maximum SUV of 14.9, Deauville 5 (formerly 10.5, likewise Deauville 5). However, many of the other periaortic lymph nodes shown on the prior PET-CT were Deauville 4 previously, and are now Deauville 5. A left external iliac/pelvic sidewall node measures 3.6 cm in short axis on image 260/3 (formerly 6.5 cm) with maximum  SUV 9.3, Deauville 5 (formerly 7.6). The previously bulky inguinal adenopathy has resolved. The previously bulky right iliac adenopathy has resolved. No splenomegaly or abnormal splenic activity. Incidental CT findings: Aortoiliac atherosclerotic vascular disease. Mesenteric, retroperitoneal, and omental infiltrative edema. There is an air-level in the rectum which may indicate a diarrheal process. Large left scrotal hydrocele. Prostatomegaly. SKELETON: Left antecubital activity is injection related. No abnormal skeletal activity is observed. Incidental CT findings: Old healed right anterior rib fractures. Cervical, thoracic, and lumbar spondylosis. IMPRESSION: 1. Although much of the bulky adenopathy in the neck, chest, and abdomen/pelvis has resolved compared to the prior PET-CT of 01/01/2018, today's exam and interval CT exams reveal that there is recurrent adenopathy in the left lower neck, left supraclavicular region, in the retroperitoneum, and in the left hemipelvis which is higher in metabolic activity than that shown on prior PET-CT (currently Deauville 5, previously ranging from Deauville 2 through Deauville 5). 2. There is some scattered residual axillary lymph nodes, currently Deauville 2 activity. 3. Patchy airspace opacities in the lower lobes, increased on the right but  reduced on the left, possibly from atypical pneumonia or aspiration pneumonitis. 4. Air fluid level in the rectum favoring diarrheal process. 5. Other imaging findings of potential clinical significance: Aortic Atherosclerosis (ICD10-I70.0). Coronary atherosclerosis. Mild cardiomegaly. Trace bilateral pleural effusions. Mesenteric edema. Large left scrotal hydrocele. Prostatomegaly. Electronically Signed   By: Van Clines M.D.   On: 05/23/2019 13:25   US Venous Img Upper Uni Right(DVT)  Result Date: 05/21/2019 CLINICAL DATA:  69 year old with right upper extremity swelling. EXAM: RIGHT UPPER EXTREMITY VENOUS DOPPLER ULTRASOUND TECHNIQUE: Gray-scale sonography with graded compression, as well as color Doppler and duplex ultrasound were performed to evaluate the upper extremity deep venous system from the level of the subclavian vein and including the jugular, axillary, basilic, radial, ulnar and upper cephalic vein. Spectral Doppler was utilized to evaluate flow at rest and with distal augmentation maneuvers. COMPARISON:  None. FINDINGS: Contralateral Subclavian Vein: Normal color Doppler flow and phasicity. Internal Jugular Vein: No evidence of thrombus. Normal compressibility, color Doppler flow and phasicity. Subclavian Vein: No evidence of thrombus. Normal color Doppler flow and phasicity. Axillary Vein: No evidence of thrombus. Normal compressibility, color Doppler flow and augmentation. Cephalic Vein: No evidence of thrombus. Normal compressibility, color Doppler flow and augmentation. Basilic Vein: No evidence of thrombus. Normal compressibility, color Doppler flow and augmentation. Brachial Veins: No evidence of thrombus. Normal compressibility, color Doppler flow and augmentation. Radial Veins: No evidence of thrombus. Normal compressibility and color Doppler flow. Ulnar Veins: No evidence of thrombus. Normal compressibility and color Doppler flow. Other Findings: No discrete abnormality at the  area of concern in the right forearm. IMPRESSION: No evidence of DVT within the right upper extremity. Electronically Signed   By: Markus Daft M.D.   On: 05/21/2019 20:46   DG Chest Portable 1 View  Result Date: 05/06/2019 CLINICAL DATA:  Fevers and altered mental status, history of lymphoma EXAM: PORTABLE CHEST 1 VIEW COMPARISON:  01/24/2019 CT, plain film from 11/13/2017 FINDINGS: Cardiac shadow is within normal limits. The lungs are well aerated bilaterally. No focal infiltrate or sizable effusion is seen. Aortic calcifications are noted. No acute bony abnormality is noted. Exuberant calcification at the first costochondral margins is noted bilaterally. IMPRESSION: No acute abnormality noted. Electronically Signed   By: Inez Catalina M.D.   On: 05/06/2019 22:18    Assessment and plan-   #Fever: Secondary to CLL versus infectious etiology. No additional fever episodes.  Blood cultures are negative. crypto antigen negative, fungi tell is pending Patient has been treated empirically with cefepime and flagyl   #CLL, recently noted to have progression of disease.  PET scan was reviewed, consistent with progression of disease noted as suspected on the CT scan.  Unclear of transformation. Patient will follow up with Dr. Rogue Bussing outpatient for further discussion of management plan, biopsy and chemotherapy.  #Anemia, multifactorial, secondary to iron deficiency and anemia secondary to CKD, hemoglobin 7.5 today, MCV 72. Continue ferrous sulfate 325 mg daily.  He will need to follow up with Dr. Rogue Bussing for outpatient IV iron treatments.  #Urinary difficulty, Flomax has been added.  Urinary catheter placement pending his voiding trial. Recommend patient to take stool softeners for constipation. #Chronic kidney disease, AKI,  #Recent history of hypercalcemia, calcium level is stable at 8.8 today.  Thank you for allowing me to participate in the care of this patient.   Earlie Server, MD, PhD Hematology  Oncology [facility] at [facility] Pager- [phone] 05/24/2019

## 2019-05-24 NOTE — Discharge Summary (Signed)
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 Anxiety   . Arthritis   . Cancer ([place])   . 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 HOH (hard of hearing)   . Hypertension   . Osteomyelitis of toe ([place]) 06/08/11   right foot  . Seasonal allergies   . Type II diabetes mellitus ([place])    diet controlled    HOSPITAL COURSE:   1.  Clinical sepsis with fever, tachycardia and tachypnea and elevated lactic acid and acute metabolic encephalopathy.  PET CT scan positive for what they commented on pneumonia.  Chest x-ray and CT scan initially were negative.  Patient is on empiric Flagyl and cefepime here in the hospital and switched over to p.o. Flagyl and Omnicef upon discharge.  Cultures are negative.  Patient had a high temperature on the 18th at 1 in the morning.  Since then temperature curve has been normal. 2.  Urinary retention.  The patient having difficulty urinating.  I started Flomax.  Patient and wife hesitant on Foley  catheter. The patient and wife were taught how to do an in and out catheterization and given some supplies by the nursing staff.  Home health will be set up to set up supplies for in and out catheterization.  Patient needs sterile one-time use catheters.   3.  Acute kidney injury on chronic kidney disease stage IIIa.  Patient's creatinine worsened during the hospital course likely because of urinary retention.  Creatinine will need to be followed up on discharge.  Creatinine 2.29 upon discharge. 4.  Type 2 diabetes mellitus with chronic kidney disease stage IIIa.  Patient was on sliding scale insulin here.  With creatinine elevation.  I advised to stop Glucophage.  Can go back on Januvia. 5.  Essential hypertension.  Holding amlodipine 6.  Hyperlipidemia unspecified on pravastatin 7.  Small B-cell lymphoma.  PET CT scan showing certain areas that have gotten better in certain areas that show higher metabolic activity.  Follow-up with Dr. Rogue Bussing as outpatient. 8.  Weakness.  Physical therapy recommended home with home health. 9.  Anemia of chronic disease.  Patient's hemoglobin did come down to 7.5 with IV fluid hydration.  Will need follow-up as outpatient. 10.  Protein calorie malnutrition.  Supplement prescribed.  DISCHARGE CONDITIONS:   Fair  CONSULTS OBTAINED:  Treatment Team:  Cammie Sickle, MD Leonel Ramsay, MD  DRUG ALLERGIES:   Allergies  Allergen Reactions  . Penicillins Anaphylaxis and  Other (See Comments)    Has patient had a PCN reaction causing immediate rash, facial/tongue/throat swelling, SOB or lightheadedness with hypotension: Unknown Has patient had a PCN reaction causing severe rash involving mucus membranes or skin necrosis: Unknown Has patient had a PCN reaction that required hospitalization: Unknown Has patient had a PCN reaction occurring within the last 10 years: No If all of the above answers are "NO", then may proceed with Cephalosporin use.      DISCHARGE MEDICATIONS:   Allergies as of 05/24/2019      Reactions   Penicillins Anaphylaxis, Other (See Comments)   Has patient had a PCN reaction causing immediate rash, facial/tongue/throat swelling, SOB or lightheadedness with hypotension: Unknown Has patient had a PCN reaction causing severe rash involving mucus membranes or skin necrosis: Unknown Has patient had a PCN reaction that required hospitalization: Unknown Has patient had a PCN reaction occurring within the last 10 years: No If all of the above answers are "NO", then may proceed with Cephalosporin use.      Medication List    STOP taking these medications   amLODipine 5 MG tablet Commonly known as: NORVASC   ECHINACEA PO   Imbruvica 420 MG Tabs Generic drug: Ibrutinib   metFORMIN 500 MG tablet Commonly known as: GLUCOPHAGE   ondansetron 4 MG tablet Commonly known as: ZOFRAN     TAKE these medications   cefdinir 300 MG capsule Commonly known as: OMNICEF Take 1 capsule (300 mg total) by mouth 2 (two) times daily for 2 days.   feeding supplement (NEPRO CARB STEADY) Liqd Take 237 mLs by mouth 3 (three) times daily between meals.   fluticasone 50 MCG/ACT nasal spray Commonly known as: FLONASE Place 2 sprays into both nostrils daily as needed for allergies or rhinitis.   Iron-Vitamin C 65-125 MG Tabs Take 1 tablet by mouth. VITRON C   Januvia 100 MG tablet Generic drug: sitaGLIPtin Take 100 mg by mouth at bedtime.   metroNIDAZOLE 500 MG tablet Commonly known as: FLAGYL Take 1 tablet (500 mg total) by mouth every 8 (eight) hours for 10 doses.   mupirocin ointment 2 % Commonly known as: BACTROBAN APPLY WITH Q TIP THREE TIMES DAILY AS NEEDED FOR DRYNESS AND CRUSTING   pravastatin 40 MG tablet Commonly known as: PRAVACHOL Take 40 mg by mouth at bedtime.   tamsulosin 0.4 MG Caps capsule Commonly known as: FLOMAX Take 1 capsule (0.4 mg total) by mouth daily after breakfast. Start taking on:  May 25, 2019        DISCHARGE INSTRUCTIONS:   Follow-up Dr. Rogue Bussing this week Follow-up PMD  If you experience worsening of your admission symptoms, develop shortness of breath, life threatening emergency, suicidal or homicidal thoughts you must seek medical attention immediately by calling 911 or calling your MD immediately  if symptoms less severe.  You Must read complete instructions/literature along with all the possible adverse reactions/side effects for all the Medicines you take and that have been prescribed to you. Take any new Medicines after you have completely understood and accept all the possible adverse reactions/side effects.   Please note  You were cared for by a hospitalist during your hospital stay. If you have any questions about your discharge medications or the care you received while you were in the hospital after you are discharged, you can call the unit and asked to speak with the hospitalist on call if the hospitalist that took care of you is not available. Once you are discharged,  your primary care physician will handle any further medical issues. Please note that NO REFILLS for any discharge medications will be authorized once you are discharged, as it is imperative that you return to your primary care physician (or establish a relationship with a primary care physician if you do not have one) for your aftercare needs so that they can reassess your need for medications and monitor your lab values.    Today   CHIEF COMPLAINT:   Chief Complaint  Patient presents with  . Altered Mental Status    HISTORY OF PRESENT ILLNESS:  Lliam Marella  is a 69 y.o. male came in with altered mental   VITAL SIGNS:  Blood pressure 133/63, pulse 82, temperature 98.1 F (36.7 C), temperature source Oral, resp. rate 20, height 6\' 1"  (1.854 m), weight 76.2 kg, SpO2 96 %.   PHYSICAL EXAMINATION:  GENERAL:  69 y.o.-year-old patient lying in the bed with no acute  distress.  EYES: Pupils equal, round, reactive to light and accommodation. No scleral icterus. HEENT: Head atraumatic, normocephalic.  LUNGS: Normal breath sounds bilaterally, no wheezing, rales,rhonchi or crepitation. No use of accessory muscles of respiration.  CARDIOVASCULAR: S1, S2 normal. No murmurs, rubs, or gallops.  ABDOMEN: Soft, non-tender, non-distended. EXTREMITIES: No pedal edema right leg.  NEUROLOGIC: Cranial nerves II through XII are intact PSYCHIATRIC: The patient is alert and oriented x 3.  SKIN: No obvious rash, lesion, or ulcer.   DATA REVIEW:   CBC Recent Labs  Lab 05/24/19 0623  WBC 5.2  HGB 7.5*  HCT 25.4*  PLT 314    Chemistries  Recent Labs  Lab 05/20/19 1033 05/21/19 0528 05/24/19 0623  NA 135   < > 138  K 4.1   < > 3.0*  CL 101   < > 109  CO2 24   < > 20*  GLUCOSE 177*   < > 153*  BUN 32*   < > 35*  CREATININE 1.84*   < > 2.29*  CALCIUM 9.7   < > 8.8*  AST 17  --   --   ALT 12  --   --   ALKPHOS 55  --   --   BILITOT 0.5  --   --    < > = values in this interval not displayed.     Microbiology Results  Results for orders placed or performed during the hospital encounter of 05/20/19  Blood culture (routine x 2)     Status: None (Preliminary result)   Collection Time: 05/20/19 10:33 AM   Specimen: BLOOD  Result Value Ref Range Status   Specimen Description BLOOD LEFT AC  Final   Special Requests   Final    BOTTLES DRAWN AEROBIC AND ANAEROBIC Blood Culture results may not be optimal due to an inadequate volume of blood received in culture bottles   Culture   Final    NO GROWTH 4 DAYS Performed at [facility], [address] [place], [place] [address]    Report Status PENDING  Incomplete  Urine Culture     Status: None   Collection Time: 05/20/19 10:33 AM   Specimen: Urine, Random  Result Value Ref Range Status   Specimen Description   Final    URINE, RANDOM Performed at [facility], [address],  [place], [place] [address]    Special Requests   Final    NONE Performed at [facility], [address] [place], [place] [address]  Culture   Final    NO GROWTH Performed at [facility], [place] [address], [place], [place] [address]    Report Status 05/21/2019 FINAL  Final  Respiratory Panel by RT PCR (Flu A&B, Covid) - Nasopharyngeal Swab     Status: None   Collection Time: 05/20/19  1:21 PM   Specimen: Nasopharyngeal Swab  Result Value Ref Range Status   SARS Coronavirus 2 by RT PCR NEGATIVE NEGATIVE Final    Comment: (NOTE) SARS-CoV-2 target nucleic acids are NOT DETECTED. The SARS-CoV-2 RNA is generally detectable in upper respiratoy specimens during the acute phase of infection. The lowest concentration of SARS-CoV-2 viral copies this assay can detect is 131 copies/mL. A negative result does not preclude SARS-Cov-2 infection and should not be used as the sole basis for treatment or other patient management decisions. A negative result may occur with  improper specimen collection/handling, submission of specimen other than nasopharyngeal swab, presence of viral mutation(s) within the areas targeted by this assay, and inadequate number of viral copies (<131 copies/mL). A negative result must be combined with clinical observations, patient history, and epidemiological information. The expected result is Negative. Fact Sheet for Patients:  PinkCheek.be Fact Sheet for Healthcare Providers:  GravelBags.it This test is not yet ap proved or cleared by the [place] FDA and  has been authorized for detection and/or diagnosis of SARS-CoV-2 by FDA under an Emergency Use Authorization (EUA). This EUA will remain  in effect (meaning this test can be used) for the duration of the COVID-19 declaration under Section 564(b)(1) of the Act, 21 U.S.C. section 360bbb-3(b)(1), unless the authorization is terminated  or revoked sooner.    Influenza A by PCR NEGATIVE NEGATIVE Final   Influenza B by PCR NEGATIVE NEGATIVE Final    Comment: (NOTE) The Xpert Xpress SARS-CoV-2/FLU/RSV assay is intended as an aid in  the diagnosis of influenza from Nasopharyngeal swab specimens and  should not be used as a sole basis for treatment. Nasal washings and  aspirates are unacceptable for Xpert Xpress SARS-CoV-2/FLU/RSV  testing. Fact Sheet for Patients: PinkCheek.be Fact Sheet for Healthcare Providers: GravelBags.it This test is not yet approved or cleared by the [place] FDA and  has been authorized for detection and/or diagnosis of SARS-CoV-2 by  FDA under an Emergency Use Authorization (EUA). This EUA will remain  in effect (meaning this test can be used) for the duration of the  Covid-19 declaration under Section 564(b)(1) of the Act, 21  U.S.C. section 360bbb-3(b)(1), unless the authorization is  terminated or revoked. Performed at [facility], [address] [place], [place] [address]     RADIOLOGY:  NM PET Image Restag (PS) Skull Base To Thigh  Result Date: 05/23/2019 CLINICAL DATA:  Subsequent treatment strategy for small B-cell lymphoma, on immune therapy. EXAM: NUCLEAR MEDICINE PET SKULL BASE TO THIGH TECHNIQUE: 9.4 mCi F-18 FDG was injected intravenously. Full-ring PET imaging was performed from the skull base to thigh after the radiotracer. CT data was obtained and used for attenuation correction and anatomic localization. Fasting blood glucose: 123 mg/dl COMPARISON:  01/01/2018 FINDINGS: Mediastinal blood pool activity: SUV max 1.9 Liver activity: SUV max 2.8 NECK: Most of the bulky bilateral Deauville 4 neck adenopathy shown on the PET-CT from 01/01/2018 has resolved, but as noted on recent diagnostic CT examinations, has been a progressive recent increase in left level IV and left level V lymph nodes including a left level  IV node measuring 1.7 cm on image 67/3  with maximum SUV of 11.7 (Deauville 5) and a left level V lymph node measuring 2.1 cm on image 66/3 with maximum SUV 7.3, Deauville 5. Incidental CT findings: Bilateral common carotid atherosclerotic calcification is noted. CHEST: Left supraclavicular adenopathy is present and includes a 1.6 cm node on image 75/3 with maximum SUV 9.4 (Deauville 5). On prior PET-CT this node had a maximum SUV of 3.6. Although the bulky axillary adenopathy shown on the prior chest CT has mostly resolved, there are some residual axillary lymph nodes including a 1.1 cm right axillary node on image 94/3 with maximum SUV 1.7, Deauville 2. Incidental CT findings: Coronary, aortic arch, and branch vessel atherosclerotic vascular disease. Mild cardiomegaly. Trace bilateral pleural effusions. There is some new reticulonodular and patchy airspace opacity medially in the right lower lobe on image 118/3, not present on 05/07/2019. On the other hand, some of the airspace opacity in the left lower lobe has improved, with residual nodular sub solid densities in the left lower lobe but with the more consolidative region shown earlier this month resolved. The appearance could be from aspiration pneumonitis or atypical pneumonia. ABDOMEN/PELVIS: Bulky Deauville 5 periaortic/retroperitoneal adenopathy extending down into the left common iliac and external iliac chains. An index left periaortic lymph node measuring 5.8 cm in short axis on image 219/3 (previously 5.2 cm on 01/01/2018) has a maximum SUV of 14.9, Deauville 5 (formerly 10.5, likewise Deauville 5). However, many of the other periaortic lymph nodes shown on the prior PET-CT were Deauville 4 previously, and are now Deauville 5. A left external iliac/pelvic sidewall node measures 3.6 cm in short axis on image 260/3 (formerly 6.5 cm) with maximum SUV 9.3, Deauville 5 (formerly 7.6). The previously bulky inguinal adenopathy has resolved. The previously bulky  right iliac adenopathy has resolved. No splenomegaly or abnormal splenic activity. Incidental CT findings: Aortoiliac atherosclerotic vascular disease. Mesenteric, retroperitoneal, and omental infiltrative edema. There is an air-level in the rectum which may indicate a diarrheal process. Large left scrotal hydrocele. Prostatomegaly. SKELETON: Left antecubital activity is injection related. No abnormal skeletal activity is observed. Incidental CT findings: Old healed right anterior rib fractures. Cervical, thoracic, and lumbar spondylosis. IMPRESSION: 1. Although much of the bulky adenopathy in the neck, chest, and abdomen/pelvis has resolved compared to the prior PET-CT of 01/01/2018, today's exam and interval CT exams reveal that there is recurrent adenopathy in the left lower neck, left supraclavicular region, in the retroperitoneum, and in the left hemipelvis which is higher in metabolic activity than that shown on prior PET-CT (currently Deauville 5, previously ranging from Deauville 2 through Deauville 5). 2. There is some scattered residual axillary lymph nodes, currently Deauville 2 activity. 3. Patchy airspace opacities in the lower lobes, increased on the right but reduced on the left, possibly from atypical pneumonia or aspiration pneumonitis. 4. Air fluid level in the rectum favoring diarrheal process. 5. Other imaging findings of potential clinical significance: Aortic Atherosclerosis (ICD10-I70.0). Coronary atherosclerosis. Mild cardiomegaly. Trace bilateral pleural effusions. Mesenteric edema. Large left scrotal hydrocele. Prostatomegaly. Electronically Signed   By: Van Clines M.D.   On: 05/23/2019 13:25    Management plans discussed with the patient, family and they are in agreement.  CODE STATUS:     Code Status Orders  (From admission, onward)         Start     Ordered   05/20/19 1443  Full code  Continuous     05/20/19 1443        Code Status History  Date Active Date  Inactive Code Status Order ID Comments User Context   05/06/2019 2257 05/09/2019 1946 Full Code [id number]  Orene Desanctis, DO ED   12/26/2017 0031 12/27/2017 1908 Full Code [id number]  Lance Coon, MD ED   06/08/2011 1642 06/12/2011 1822 Full Code [id number]  Scarlette Calico, RN Inpatient   Advance Care Planning Activity      TOTAL TIME TAKING CARE OF THIS PATIENT: 35 minutes.    Loletha Grayer M.D on 05/24/2019 at 4:50 PM  Between 7am to 6pm - Pager - [phone]  After 6pm go to [email or web address] - password [email or web address]  [facility]  CC: Primary care physician; [name], MD

## 2019-05-24 NOTE — Progress Notes (Signed)
[name] to be D/C'd Home per MD order.  Discussed prescriptions and follow up appointments with the patient. Prescriptions given to patient, medication list explained in detail. Pt verbalized understanding.  Allergies as of 05/24/2019      Reactions   Penicillins Anaphylaxis, Other (See Comments)   Has patient had a PCN reaction causing immediate rash, facial/tongue/throat swelling, SOB or lightheadedness with hypotension: Unknown Has patient had a PCN reaction causing severe rash involving mucus membranes or skin necrosis: Unknown Has patient had a PCN reaction that required hospitalization: Unknown Has patient had a PCN reaction occurring within the last 10 years: No If all of the above answers are "NO", then may proceed with Cephalosporin use.      Medication List    STOP taking these medications   amLODipine 5 MG tablet Commonly known as: NORVASC   ECHINACEA PO   Imbruvica 420 MG Tabs Generic drug: Ibrutinib   metFORMIN 500 MG tablet Commonly known as: GLUCOPHAGE   ondansetron 4 MG tablet Commonly known as: ZOFRAN     TAKE these medications   cefdinir 300 MG capsule Commonly known as: OMNICEF Take 1 capsule (300 mg total) by mouth 2 (two) times daily for 2 days.   feeding supplement (NEPRO CARB STEADY) Liqd Take 237 mLs by mouth 3 (three) times daily between meals.   fluticasone 50 MCG/ACT nasal spray Commonly known as: FLONASE Place 2 sprays into both nostrils daily as needed for allergies or rhinitis.   Iron-Vitamin C 65-125 MG Tabs Take 1 tablet by mouth. VITRON C   Januvia 100 MG tablet Generic drug: sitaGLIPtin Take 100 mg by mouth at bedtime.   metroNIDAZOLE 500 MG tablet Commonly known as: FLAGYL Take 1 tablet (500 mg total) by mouth every 8 (eight) hours for 10 doses.   mupirocin ointment 2 % Commonly known as: BACTROBAN APPLY WITH Q TIP THREE TIMES DAILY AS NEEDED FOR DRYNESS AND CRUSTING   pravastatin 40 MG tablet Commonly known as:  PRAVACHOL Take 40 mg by mouth at bedtime.   tamsulosin 0.4 MG Caps capsule Commonly known as: FLOMAX Take 1 capsule (0.4 mg total) by mouth daily after breakfast. Start taking on: May 25, 2019       Vitals:   05/24/19 0416 05/24/19 0416  BP: 133/63 133/63  Pulse: 89 82  Resp: 20 20  Temp: 98.1 F (36.7 C) 98.1 F (36.7 C)  SpO2: 96% 96%    Skin clean, dry and intact without evidence of skin break down, no evidence of skin tears noted. IV catheters discontinued intact. Site without signs and symptoms of complications. Dressing and pressure applied. Wife educated on how to insert straight catheter using demonstration and teach back method.  An After Visit Summary was printed and given to the patient. Patient escorted via [place], and D/C home via private auto.  [name] 05/24/2019 4:01 PM

## 2019-05-25 ENCOUNTER — Telehealth: Payer: Self-pay | Admitting: Internal Medicine

## 2019-05-25 DIAGNOSIS — C8308 Small cell B-cell lymphoma, lymph nodes of multiple sites: Secondary | ICD-10-CM

## 2019-05-25 DIAGNOSIS — D500 Iron deficiency anemia secondary to blood loss (chronic): Secondary | ICD-10-CM

## 2019-05-25 LAB — CULTURE, BLOOD (ROUTINE X 2): Culture: NO GROWTH

## 2019-05-25 NOTE — Progress Notes (Signed)
Patient ID: [name], male   DOB: 30-Jan-1951, 69 y.o.   MRN: [id number]  Wife ended up calling me back.  Patient did urinate quite a few times yesterday evening and last night.  Urinated this morning.  She had not needed to cath him yet.  If he does not go in 8 hours then she can try to cath him.  She will try to go to the medical supply store if the home health agency does not bring any further supplies. He is taking Flomax.  I did give them the number to the urologist to follow-up.  No further fever.  Finishing up antibiotics.  Dr Loletha Grayer

## 2019-05-25 NOTE — Telephone Encounter (Signed)
C- please Schedule follow up on- 3/23- 9:45; MD; labs- cbc/bmp; Venofer- Dr.B  GB

## 2019-05-26 NOTE — Addendum Note (Signed)
Addended by: Gloris Ham on: 05/26/2019 08:36 AM   Modules accepted: Orders

## 2019-05-26 NOTE — Telephone Encounter (Signed)
[name], left vm for wife regarding the need for follow-up apts tomorrow.

## 2019-05-27 ENCOUNTER — Telehealth: Payer: Self-pay | Admitting: *Deleted

## 2019-05-27 ENCOUNTER — Inpatient Hospital Stay: Payer: Medicare Other | Admitting: Internal Medicine

## 2019-05-27 ENCOUNTER — Inpatient Hospital Stay: Payer: Medicare Other

## 2019-05-27 LAB — FUNGITELL, SERUM: Fungitell Result: 329 pg/mL — ABNORMAL HIGH (ref <80)

## 2019-05-27 NOTE — Assessment & Plan Note (Deleted)
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 On Ibrutinib 420 mg/day; CT nov 2020- CT C/A/P- STABLE ~4 cm/bulky retroperitoneal adenopathy.  Currently on Imbruvica.  #However concerns of progression of disease [hypercalcemia-see below]; recommend PET scan for further evaluation ASAP.  If not then CT scan chest and pelvis needs to be done.  Okay to discontinue Imbruvica.  #Low-grade fevers-unclear etiology; recommend further evaluation emergency room including chest x-ray/UA; Covid testing etc.  #Hypercalcemia-concerning for malignancy related-mental status changes; recommend Zometa.   #Anemia-hemoglobin 8.3- stable;  -iron deficiency/ FOBT positive; patient likely need GI evaluation in the hospital.  #Bilateral PE [sep 2019]-HELD Eliquis [jan 18th 2020- held- sec to worsening anemia].  Stable    DISPOSITION: #Patient sent to emergency room.

## 2019-05-27 NOTE — Telephone Encounter (Signed)
Contacted patient's wife. Patient no show for his appointment. Wife stated that she she didn't have her cell phone on to get the msg about his apt. Patient does not have any fevers. Patient is urinating 1100 mls daily. Wife has not needed to do a in/out cath at home for this good urine output. Per wife - patient has not had any combative or any more confusion.  [name] will reschedule his apt in the clinic with lab/md/possible iron

## 2019-05-27 NOTE — Progress Notes (Deleted)
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 AUG 2020-slight incresae in RP LN ~1cm; continue Ibrutinib;  # worsening Anemia- Jan 2021- Hb 8.5; STOP eliquis; FOBT- positive; referral to GI/pt declines  # DM/HTN/ CKD- stage III -----------------------------------------------   MOLECULAR TESTING: Trisomy 12. 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  Patient wife available at the appointment today.  [name] 69 y.o.  male above diagnosis of of SLL/CLL on ibrutinib; incidental bilateral PE on currently off Eliquis [secondary to worsening anemia] is here for follow-up.   Patient finally agreed to make an appointment with the GI.  Has appointment with with Dr. Allen Norris in April.  As per wife is more tired.  Intermittently confused.  Poor appetite.  Losing weight.  He was urinary incontinent in the clinic.  Low-grade fevers noted in the clinic noted   Review of Systems  Unable to perform ROS: Mental acuity     MEDICAL HISTORY:  Past Medical History:  Diagnosis Date  . Anxiety   . Arthritis   . Cancer ([place])   . 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 HOH (hard of hearing)   . Hypertension   .  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 Seasonal allergies   . Type II diabetes mellitus ([place])    diet controlled    SURGICAL HISTORY: Past Surgical History:  Procedure Laterality Date  . AMPUTATION  06/08/2011   Procedure: AMPUTATION RAY;  Surgeon: Wylene Simmer, MD;  Location: [facility];  Service: Orthopedics;  Laterality: Right;  [place] Amputation  . [place]   "crushed"  . FRACTURE SURGERY    . 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 PILONIDAL CYST / SINUS EXCISION  1970's  . TOE AMPUTATION  06/08/11   partial; right great toe    SOCIAL HISTORY: Social History   Socioeconomic History  . Marital status: Married    Spouse name: Not on file  . Number of children: Not on file  . Years of education: Not on file  . Highest education level: Not on file  Occupational History  . Not on file  Tobacco Use  . Smoking status: Former Smoker    Packs/day: 1.50    Years: 22.00    Pack years: 33.00    Types: Cigarettes    Quit date: 05/24/2011    Years since quitting: 8.0  . Smokeless tobacco: Former Systems developer    Types: [place] date: 03/07/1983  Substance and Sexual Activity  . Alcohol use: Yes    Comment: 06/08/11 "no liquor for 2 1/2 years; last beer 05/31/11"  .  Drug use: No  . Sexual activity: Yes  Other Topics Concern  . Not on file  Social History Narrative   Married   Social Determinants of Health   Financial Resource Strain:   . Difficulty of Paying Living Expenses:   Food Insecurity:   . Worried About Charity fundraiser in the Last Year:   . Arboriculturist in the Last Year:   Transportation Needs:   . Film/video editor (Medical):   Marland Kitchen Lack of Transportation (Non-Medical):   Physical Activity:   . Days of Exercise per Week:   . Minutes of Exercise per Session:   Stress:   . Feeling of Stress :   Social Connections:   . Frequency of Communication with Friends and Family:   . Frequency of Social Gatherings with Friends and  Family:   . Attends Religious Services:   . Active Member of Clubs or Organizations:   . Attends Archivist Meetings:   Marland Kitchen Marital Status:   Intimate Partner Violence:   . Fear of Current or Ex-Partner:   . Emotionally Abused:   Marland Kitchen Physically Abused:   . Sexually Abused:     FAMILY HISTORY: Family History  Problem Relation Age of Onset  . Prostate cancer Father   . Hypertension Father   . Other Mother        VARICOSE VEINS    ALLERGIES:  is allergic to penicillins.  MEDICATIONS:  Current Outpatient Medications  Medication Sig Dispense Refill  . fluticasone (FLONASE) 50 MCG/ACT nasal spray Place 2 sprays into both nostrils daily as needed for allergies or rhinitis.    . Iron-Vitamin C 65-125 MG TABS Take 1 tablet by mouth. VITRON C    . JANUVIA 100 MG tablet Take 100 mg by mouth at bedtime.   3  . metroNIDAZOLE (FLAGYL) 500 MG tablet Take 1 tablet (500 mg total) by mouth every 8 (eight) hours for 10 doses. 10 tablet 0  . mupirocin ointment (BACTROBAN) 2 % APPLY WITH Q TIP THREE TIMES DAILY AS NEEDED FOR DRYNESS AND CRUSTING  3  . Nutritional Supplements (FEEDING SUPPLEMENT, NEPRO CARB STEADY,) LIQD Take 237 mLs by mouth 3 (three) times daily between meals. 21330 mL 0  . pravastatin (PRAVACHOL) 40 MG tablet Take 40 mg by mouth at bedtime.     . tamsulosin (FLOMAX) 0.4 MG CAPS capsule Take 1 capsule (0.4 mg total) by mouth daily after breakfast. 30 capsule 0   No current facility-administered medications for this visit.      Marland Kitchen  PHYSICAL EXAMINATION: ECOG PERFORMANCE STATUS: 1 - Symptomatic but completely ambulatory  There were no vitals filed for this visit. There were no vitals filed for this visit.  Physical Exam  Constitutional: He is well-developed, well-nourished, and in no distress.  Accompanied by his wife.  Appears confused.  In a wheelchair.  HENT:  Head: Normocephalic and atraumatic.  Mouth/Throat: Oropharynx is clear and moist. No oropharyngeal  exudate.  Eyes: Pupils are equal, round, and reactive to light.  Cardiovascular: Normal rate and regular rhythm.  Pulmonary/Chest: Effort normal and breath sounds normal. No respiratory distress. He has no wheezes.  Abdominal: Soft. Bowel sounds are normal. He exhibits no distension and no mass. There is no abdominal tenderness. There is no rebound and no guarding.  Musculoskeletal:        General: No tenderness or edema. Normal range of motion.     Cervical back: Normal range of motion and neck supple.  Lymphadenopathy:  Significant improvement of the lymphadenopathy in the neck/resolved; scattered approximately 1 cm few lymph nodes present in the right left underarm.  Neurological:  Alert but oriented x1-2.  Skin: Skin is warm.  Psychiatric: Affect normal.     LABORATORY DATA:  I have reviewed the data as listed Lab Results  Component Value Date   WBC 5.2 05/24/2019   HGB 7.5 (L) 05/24/2019   HCT 25.4 (L) 05/24/2019   MCV 72.8 (L) 05/24/2019   PLT 314 05/24/2019   Recent Labs    05/06/19 1816 05/06/19 1816 05/07/19 0451 05/08/19 0555 05/20/19 1033 05/21/19 0528 05/22/19 0653 05/23/19 0537 05/24/19 0623  NA 134*   < > 136   < > 135   < > 137 137 138  K 4.5   < > 3.9   < > 4.1   < > 3.3* 3.6 3.0*  CL 103   < > 103   < > 101   < > 105 107 109  CO2 21*   < > 25   < > 24   < > 20* 20* 20*  GLUCOSE 177*   < > 166*   < > 177*   < > 160* 119* 153*  BUN 31*   < > 29*   < > 32*   < > 33* 38* 35*  CREATININE 1.38*   < > 1.42*   < > 1.84*   < > 2.03* 2.22* 2.29*  CALCIUM 10.6*   < > 10.3   < > 9.7   < > 9.3 9.0 8.8*  GFRNONAA 53*   < > 51*   < > 37*   < > 33* 30* 28*  GFRAA >60   < > 59*   < > 43*   < > 38* 34* 33*  PROT 5.7*  --  6.3*  --  6.7  --   --   --   --   ALBUMIN 2.6*  --  2.6*  --  3.1*  --   --   --   --   AST 14*  --  11*  --  17  --   --   --   --   ALT 11  --  10  --  12  --   --   --   --   ALKPHOS 47  --  49  --  55  --   --   --   --   BILITOT 0.8  --  0.6   --  0.5  --   --   --   --   BILIDIR 0.2  --   --   --   --   --   --   --   --   IBILI 0.6  --   --   --   --   --   --   --   --    < > = values in this interval not displayed.    RADIOGRAPHIC STUDIES: I have personally reviewed the radiological images as listed and agreed with the findings in the report. CT ABDOMEN PELVIS WO CONTRAST  Result Date: 05/07/2019 CLINICAL DATA:  Hyperparathyroidism. Hypercalcemia. Small B-cell lymphoma, on immune therapy. Pulmonary emboli in September. Altered mental status and fever. EXAM: CT CHEST, ABDOMEN AND PELVIS WITHOUT CONTRAST TECHNIQUE: Multidetector CT imaging of the chest, abdomen and pelvis was performed following the standard protocol without IV contrast. COMPARISON:  01/24/2019 FINDINGS: CT CHEST  FINDINGS Cardiovascular: Multifactorial degradation, including motion in the lower chest, patient arm position (not raised above the head), and lack of IV contrast. Aortic and branch vessel atherosclerosis. Tortuous thoracic aorta. Mild cardiomegaly. Multivessel coronary artery atherosclerosis. Mediastinum/Nodes: Left supraclavicular index node measures 2.3 cm on 07/02, increased from 1.3 cm on the prior. Low left jugular nodes measure up to 1.8 cm today versus 1.4 cm on the prior. Relatively similar multiple bilateral axillary nodes. Left paratracheal node measures 1.2 cm on 22/2, newly enlarged since the prior. Hilar regions poorly evaluated without intravenous contrast. Lungs/Pleura: No pleural fluid. Right greater than left subpleural pulmonary nodules are similar, likely subpleural lymph nodes. Example at up to 6 mm on 72/4 in the right upper lobe. New superior segment left lower lobe clustered nodular airspace and ground-glass opacity, including on 74/4. Musculoskeletal: Right upper extremity edema and subcutaneous gas are incompletely imaged, including on 38/2. Question intramuscular hematoma, including on 44/2. No acute osseous abnormality. CT ABDOMEN PELVIS  FINDINGS Hepatobiliary: Multifactorial degradation continuing into the upper abdomen. Grossly normal noncontrast appearance of the liver, gallbladder. Pancreas: Grossly normal pancreas. Spleen: Normal in size, without focal abnormality. Adrenals/Urinary Tract: Normal adrenal glands. Favor renal vascular calcifications over renal calculi. No hydronephrosis. Bladder wall thickening is mild, at least partially felt to be due to underdistention. Stomach/Bowel: Grossly normal stomach. Colonic stool burden suggests constipation. Normal small bowel caliber. Vascular/Lymphatic: Aortic atherosclerosis. Bulky abdominal adenopathy. Index left periaortic node measures 2.7 x 3.6 cm on 77/2 versus 2.2 x 2.8 cm at the same level on the prior. A dominant left periaortic nodal mass measures maximally 6.7 cm on 93/2 today versus 5.5 cm on the prior exam (when remeasured). Index left external iliac node measures 3.3 cm on 116/2 and is similar to on the prior exam (when remeasured). Reproductive: Moderate prostatomegaly. Other: No significant free fluid. Musculoskeletal: No acute osseous abnormality. Lumbosacral junction degenerative disc disease. IMPRESSION: 1. Multifactorial degradation, including lack of IV contrast, motion, patient arm position. 2. Since 01/24/2019, development of left lower lobe nodular airspace disease, suspicious for infection or aspiration. 3. Right upper extremity edema and gas with possible intramuscular hematoma. Correlate with recent trauma or attempted IV placement. If no correlate history, deep venous thrombosis of the right upper extremity to would be a consideration and ultrasound suggested. 4. Progressive lymphoma within the neck, chest, abdomen, as detailed above. Pelvic adenopathy is relatively similar. 5. Prostatomegaly. Apparent bladder wall thickening is at least partially felt to be due to underdistention. Correlate for possible bladder outlet obstruction. 6. Coronary artery atherosclerosis.  Aortic Atherosclerosis (ICD10-I70.0). 7.  Possible constipation. Electronically Signed   By: Abigail Miyamoto M.D.   On: 05/07/2019 16:57   DG Chest 1 View  Result Date: 05/20/2019 CLINICAL DATA:  Fever, altered mental status EXAM: CHEST  1 VIEW COMPARISON:  None. FINDINGS: The heart size and mediastinal contours are within normal limits. Both lungs are clear. No pleural effusion or pneumothorax. The visualized skeletal structures are unremarkable. IMPRESSION: No acute process in the chest Electronically Signed   By: Macy Mis M.D.   On: 05/20/2019 10:57   DG Chest 2 View  Result Date: 05/22/2019 CLINICAL DATA:  Fever EXAM: CHEST - 2 VIEW COMPARISON:  May 20, 2019 FINDINGS: Lungs are clear. Heart size and pulmonary vascularity are normal. No adenopathy. There is degenerative change in the lower thoracic spine. There is a skin fold on the right. No appreciable pneumothorax. IMPRESSION: Lungs clear. Cardiac silhouette within normal limits. Apparent skin fold on  the right. Electronically Signed   By: Lowella Grip III M.D.   On: 05/22/2019 08:43   CT HEAD WO CONTRAST  Result Date: 05/20/2019 CLINICAL DATA:  Patient with lymphoma undergoing treatment. Cephalopathy. EXAM: CT HEAD WITHOUT CONTRAST TECHNIQUE: Contiguous axial images were obtained from the base of the skull through the vertex without intravenous contrast. 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 Cerebral hemispheres show chronic small-vessel change of the white matter. No sign of acute infarction, mass lesion, hemorrhage, hydrocephalus or extra-axial collection. Vascular: There is atherosclerotic calcification of the major vessels at the base of the brain. Skull: Negative Sinuses/Orbits: Clear/normal Other: None IMPRESSION: No acute finding. Chronic small-vessel change of the cerebral hemispheric white matter. Old small vessel cerebellar infarctions. Electronically Signed   By: Nelson Chimes M.D.   On: 05/20/2019 19:56   CT Head Wo Contrast  Result Date: 05/06/2019 CLINICAL DATA:  Altered mental status, unclear cause EXAM: CT HEAD WITHOUT CONTRAST TECHNIQUE: Contiguous axial images were obtained from the base of the skull through the vertex without intravenous contrast. COMPARISON:  None. 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 No CT evident large vascular territory or cortically based infarction is seen. No evidence of acute, hemorrhage, hydrocephalus, extra-axial collection or mass lesion/mass effect. Patchy areas of white matter hypoattenuation are most compatible with chronic microvascular angiopathy. Symmetric prominence of the ventricles, cisterns and sulci compatible with parenchymal volume loss. Vascular: Atherosclerotic calcification of the carotid siphons and intradural vertebral arteries. No hyperdense vessel. Skull: No calvarial fracture or suspicious osseous lesion. No scalp swelling or hematoma. Vascular calcium of the scalp, often seen in diabetes. Sinuses/Orbits: Paranasal sinuses and mastoid air cells are predominantly clear. Included orbital structures are unremarkable. Other: None IMPRESSION: 1. Multiple acquisition required due to patient motion artifact. 2. No convincing CT evidence of acute intracranial abnormality. 3. 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 4. Chronic microangiopathic changes with parenchymal volume loss. Intracranial atherosclerosis. Electronically Signed   By: Lovena Le M.D.   On: 05/06/2019 21:15   CT CHEST WO CONTRAST  Result Date: 05/07/2019 CLINICAL DATA:  Hyperparathyroidism. Hypercalcemia. Small B-cell lymphoma, on immune therapy. Pulmonary emboli in September. Altered mental status and fever. EXAM: CT CHEST, ABDOMEN AND PELVIS WITHOUT CONTRAST TECHNIQUE: Multidetector CT imaging of the chest, abdomen and pelvis was performed following the  standard protocol without IV contrast. COMPARISON:  01/24/2019 FINDINGS: CT CHEST FINDINGS Cardiovascular: Multifactorial degradation, including motion in the lower chest, patient arm position (not raised above the head), and lack of IV contrast. Aortic and branch vessel atherosclerosis. Tortuous thoracic aorta. Mild cardiomegaly. Multivessel coronary artery atherosclerosis. Mediastinum/Nodes: Left supraclavicular index node measures 2.3 cm on 07/02, increased from 1.3 cm on the prior. Low left jugular nodes measure up to 1.8 cm today versus 1.4 cm on the prior. Relatively similar multiple bilateral axillary nodes. Left paratracheal node measures 1.2 cm on 22/2, newly enlarged since the prior. Hilar regions poorly evaluated without intravenous contrast. Lungs/Pleura: No pleural fluid. Right greater than left subpleural pulmonary nodules are similar, likely subpleural lymph nodes. Example at up to 6 mm on 72/4 in the right upper lobe. New superior segment left lower lobe clustered nodular airspace and ground-glass opacity, including on 74/4. Musculoskeletal: Right upper extremity edema and subcutaneous gas are incompletely imaged, including on 38/2. Question intramuscular hematoma, including on 44/2. No acute osseous abnormality. CT ABDOMEN PELVIS FINDINGS Hepatobiliary: Multifactorial degradation continuing into  the upper abdomen. Grossly normal noncontrast appearance of the liver, gallbladder. Pancreas: Grossly normal pancreas. Spleen: Normal in size, without focal abnormality. Adrenals/Urinary Tract: Normal adrenal glands. Favor renal vascular calcifications over renal calculi. No hydronephrosis. Bladder wall thickening is mild, at least partially felt to be due to underdistention. Stomach/Bowel: Grossly normal stomach. Colonic stool burden suggests constipation. Normal small bowel caliber. Vascular/Lymphatic: Aortic atherosclerosis. Bulky abdominal adenopathy. Index left periaortic node measures 2.7 x 3.6 cm on  77/2 versus 2.2 x 2.8 cm at the same level on the prior. A dominant left periaortic nodal mass measures maximally 6.7 cm on 93/2 today versus 5.5 cm on the prior exam (when remeasured). Index left external iliac node measures 3.3 cm on 116/2 and is similar to on the prior exam (when remeasured). Reproductive: Moderate prostatomegaly. Other: No significant free fluid. Musculoskeletal: No acute osseous abnormality. Lumbosacral junction degenerative disc disease. IMPRESSION: 1. Multifactorial degradation, including lack of IV contrast, motion, patient arm position. 2. Since 01/24/2019, development of left lower lobe nodular airspace disease, suspicious for infection or aspiration. 3. Right upper extremity edema and gas with possible intramuscular hematoma. Correlate with recent trauma or attempted IV placement. If no correlate history, deep venous thrombosis of the right upper extremity to would be a consideration and ultrasound suggested. 4. Progressive lymphoma within the neck, chest, abdomen, as detailed above. Pelvic adenopathy is relatively similar. 5. Prostatomegaly. Apparent bladder wall thickening is at least partially felt to be due to underdistention. Correlate for possible bladder outlet obstruction. 6. Coronary artery atherosclerosis. Aortic Atherosclerosis (ICD10-I70.0). 7.  Possible constipation. Electronically Signed   By: Abigail Miyamoto M.D.   On: 05/07/2019 16:57   NM PET Image Restag (PS) Skull Base To Thigh  Result Date: 05/23/2019 CLINICAL DATA:  Subsequent treatment strategy for small B-cell lymphoma, on immune therapy. EXAM: NUCLEAR MEDICINE PET SKULL BASE TO THIGH TECHNIQUE: 9.4 mCi F-18 FDG was injected intravenously. Full-ring PET imaging was performed from the skull base to thigh after the radiotracer. CT data was obtained and used for attenuation correction and anatomic localization. Fasting blood glucose: 123 mg/dl COMPARISON:  01/01/2018 FINDINGS: Mediastinal blood pool activity: SUV max  1.9 Liver activity: SUV max 2.8 NECK: Most of the bulky bilateral Deauville 4 neck adenopathy shown on the PET-CT from 01/01/2018 has resolved, but as noted on recent diagnostic CT examinations, has been a progressive recent increase in left level IV and left level V lymph nodes including a left level IV node measuring 1.7 cm on image 67/3 with maximum SUV of 11.7 (Deauville 5) and a left level V lymph node measuring 2.1 cm on image 66/3 with maximum SUV 7.3, Deauville 5. Incidental CT findings: Bilateral common carotid atherosclerotic calcification is noted. CHEST: Left supraclavicular adenopathy is present and includes a 1.6 cm node on image 75/3 with maximum SUV 9.4 (Deauville 5). On prior PET-CT this node had a maximum SUV of 3.6. Although the bulky axillary adenopathy shown on the prior chest CT has mostly resolved, there are some residual axillary lymph nodes including a 1.1 cm right axillary node on image 94/3 with maximum SUV 1.7, Deauville 2. Incidental CT findings: Coronary, aortic arch, and branch vessel atherosclerotic vascular disease. Mild cardiomegaly. Trace bilateral pleural effusions. There is some new reticulonodular and patchy airspace opacity medially in the right lower lobe on image 118/3, not present on 05/07/2019. On the other hand, some of the airspace opacity in the left lower lobe has improved, with residual nodular sub solid densities in the left  lower lobe but with the more consolidative region shown earlier this month resolved. The appearance could be from aspiration pneumonitis or atypical pneumonia. ABDOMEN/PELVIS: Bulky Deauville 5 periaortic/retroperitoneal adenopathy extending down into the left common iliac and external iliac chains. An index left periaortic lymph node measuring 5.8 cm in short axis on image 219/3 (previously 5.2 cm on 01/01/2018) has a maximum SUV of 14.9, Deauville 5 (formerly 10.5, likewise Deauville 5). However, many of the other periaortic lymph nodes shown on  the prior PET-CT were Deauville 4 previously, and are now Deauville 5. A left external iliac/pelvic sidewall node measures 3.6 cm in short axis on image 260/3 (formerly 6.5 cm) with maximum SUV 9.3, Deauville 5 (formerly 7.6). The previously bulky inguinal adenopathy has resolved. The previously bulky right iliac adenopathy has resolved. No splenomegaly or abnormal splenic activity. Incidental CT findings: Aortoiliac atherosclerotic vascular disease. Mesenteric, retroperitoneal, and omental infiltrative edema. There is an air-level in the rectum which may indicate a diarrheal process. Large left scrotal hydrocele. Prostatomegaly. SKELETON: Left antecubital activity is injection related. No abnormal skeletal activity is observed. Incidental CT findings: Old healed right anterior rib fractures. Cervical, thoracic, and lumbar spondylosis. IMPRESSION: 1. Although much of the bulky adenopathy in the neck, chest, and abdomen/pelvis has resolved compared to the prior PET-CT of 01/01/2018, today's exam and interval CT exams reveal that there is recurrent adenopathy in the left lower neck, left supraclavicular region, in the retroperitoneum, and in the left hemipelvis which is higher in metabolic activity than that shown on prior PET-CT (currently Deauville 5, previously ranging from Deauville 2 through Deauville 5). 2. There is some scattered residual axillary lymph nodes, currently Deauville 2 activity. 3. Patchy airspace opacities in the lower lobes, increased on the right but reduced on the left, possibly from atypical pneumonia or aspiration pneumonitis. 4. Air fluid level in the rectum favoring diarrheal process. 5. Other imaging findings of potential clinical significance: Aortic Atherosclerosis (ICD10-I70.0). Coronary atherosclerosis. Mild cardiomegaly. Trace bilateral pleural effusions. Mesenteric edema. Large left scrotal hydrocele. Prostatomegaly. Electronically Signed   By: Van Clines M.D.   On:  05/23/2019 13:25   US Venous Img Upper Uni Right(DVT)  Result Date: 05/21/2019 CLINICAL DATA:  69 year old with right upper extremity swelling. EXAM: RIGHT UPPER EXTREMITY VENOUS DOPPLER ULTRASOUND TECHNIQUE: Gray-scale sonography with graded compression, as well as color Doppler and duplex ultrasound were performed to evaluate the upper extremity deep venous system from the level of the subclavian vein and including the jugular, axillary, basilic, radial, ulnar and upper cephalic vein. Spectral Doppler was utilized to evaluate flow at rest and with distal augmentation maneuvers. COMPARISON:  None. FINDINGS: Contralateral Subclavian Vein: Normal color Doppler flow and phasicity. Internal Jugular Vein: No evidence of thrombus. Normal compressibility, color Doppler flow and phasicity. Subclavian Vein: No evidence of thrombus. Normal color Doppler flow and phasicity. Axillary Vein: No evidence of thrombus. Normal compressibility, color Doppler flow and augmentation. Cephalic Vein: No evidence of thrombus. Normal compressibility, color Doppler flow and augmentation. Basilic Vein: No evidence of thrombus. Normal compressibility, color Doppler flow and augmentation. Brachial Veins: No evidence of thrombus. Normal compressibility, color Doppler flow and augmentation. Radial Veins: No evidence of thrombus. Normal compressibility and color Doppler flow. Ulnar Veins: No evidence of thrombus. Normal compressibility and color Doppler flow. Other Findings: No discrete abnormality at the area of concern in the right forearm. IMPRESSION: No evidence of DVT within the right upper extremity. Electronically Signed   By: Markus Daft M.D.   On: 05/21/2019  20:46   DG Chest Portable 1 View  Result Date: 05/06/2019 CLINICAL DATA:  Fevers and altered mental status, history of lymphoma EXAM: PORTABLE CHEST 1 VIEW COMPARISON:  01/24/2019 CT, plain film from 11/13/2017 FINDINGS: Cardiac shadow is within normal limits. The lungs are well  aerated bilaterally. No focal infiltrate or sizable effusion is seen. Aortic calcifications are noted. No acute bony abnormality is noted. Exuberant calcification at the first costochondral margins is noted bilaterally. IMPRESSION: No acute abnormality noted. Electronically Signed   By: Inez Catalina M.D.   On: 05/06/2019 22:18    ASSESSMENT & PLAN:   No problem-specific Assessment & Plan notes found for this encounter.  All questions were answered. The patient knows to call the clinic with any problems, questions or concerns.     Cammie Sickle, MD 05/27/2019 9:26 AM

## 2019-05-30 ENCOUNTER — Other Ambulatory Visit: Payer: Self-pay

## 2019-05-30 ENCOUNTER — Inpatient Hospital Stay: Payer: Medicare Other

## 2019-05-30 ENCOUNTER — Inpatient Hospital Stay (HOSPITAL_BASED_OUTPATIENT_CLINIC_OR_DEPARTMENT_OTHER): Payer: Medicare Other | Admitting: Internal Medicine

## 2019-05-30 DIAGNOSIS — C8308 Small cell B-cell lymphoma, lymph nodes of multiple sites: Secondary | ICD-10-CM

## 2019-05-30 DIAGNOSIS — I2699 Other pulmonary embolism without acute cor pulmonale: Secondary | ICD-10-CM | POA: Diagnosis not present

## 2019-05-30 DIAGNOSIS — D509 Iron deficiency anemia, unspecified: Secondary | ICD-10-CM | POA: Diagnosis present

## 2019-05-30 DIAGNOSIS — D500 Iron deficiency anemia secondary to blood loss (chronic): Secondary | ICD-10-CM

## 2019-05-30 DIAGNOSIS — E1122 Type 2 diabetes mellitus with diabetic chronic kidney disease: Secondary | ICD-10-CM | POA: Diagnosis not present

## 2019-05-30 DIAGNOSIS — Z87891 Personal history of nicotine dependence: Secondary | ICD-10-CM | POA: Diagnosis not present

## 2019-05-30 DIAGNOSIS — I129 Hypertensive chronic kidney disease with stage 1 through stage 4 chronic kidney disease, or unspecified chronic kidney disease: Secondary | ICD-10-CM | POA: Diagnosis not present

## 2019-05-30 DIAGNOSIS — R509 Fever, unspecified: Secondary | ICD-10-CM | POA: Diagnosis not present

## 2019-05-30 DIAGNOSIS — N183 Chronic kidney disease, stage 3 unspecified: Secondary | ICD-10-CM | POA: Diagnosis not present

## 2019-05-30 LAB — BASIC METABOLIC PANEL
Anion gap: 9 (ref 5–15)
BUN: 31 mg/dL — ABNORMAL HIGH (ref 8–23)
CO2: 25 mmol/L (ref 22–32)
Calcium: 8.9 mg/dL (ref 8.9–10.3)
Chloride: 101 mmol/L (ref 98–111)
Creatinine, Ser: 1.69 mg/dL — ABNORMAL HIGH (ref 0.61–1.24)
GFR calc Af Amer: 48 mL/min — ABNORMAL LOW (ref >60)
GFR calc non Af Amer: 41 mL/min — ABNORMAL LOW (ref >60)
Glucose, Bld: 231 mg/dL — ABNORMAL HIGH (ref 70–99)
Potassium: 3.5 mmol/L (ref 3.5–5.1)
Sodium: 135 mmol/L (ref 135–145)

## 2019-05-30 LAB — CBC WITH DIFFERENTIAL/PLATELET
Abs Immature Granulocytes: 0 10*3/uL (ref 0.00–0.07)
Band Neutrophils: 1 %
Basophils Absolute: 0.1 10*3/uL (ref 0.0–0.1)
Basophils Relative: 1 %
Eosinophils Absolute: 0 10*3/uL (ref 0.0–0.5)
Eosinophils Relative: 0 %
HCT: 28.2 % — ABNORMAL LOW (ref 39.0–52.0)
Hemoglobin: 8.5 g/dL — ABNORMAL LOW (ref 13.0–17.0)
Lymphocytes Relative: 17 %
Lymphs Abs: 1.3 10*3/uL (ref 0.7–4.0)
MCH: 21.7 pg — ABNORMAL LOW (ref 26.0–34.0)
MCHC: 30.1 g/dL (ref 30.0–36.0)
MCV: 72.1 fL — ABNORMAL LOW (ref 80.0–100.0)
Monocytes Absolute: 0.5 10*3/uL (ref 0.1–1.0)
Monocytes Relative: 6 %
Neutro Abs: 6 10*3/uL (ref 1.7–7.7)
Neutrophils Relative %: 75 %
Platelets: 630 10*3/uL — ABNORMAL HIGH (ref 150–400)
RBC: 3.91 MIL/uL — ABNORMAL LOW (ref 4.22–5.81)
RDW: 20.3 % — ABNORMAL HIGH (ref 11.5–15.5)
Smear Review: INCREASED
WBC: 7.9 10*3/uL (ref 4.0–10.5)
nRBC: 0 % (ref 0.0–0.2)

## 2019-05-30 LAB — SAMPLE TO BLOOD BANK

## 2019-05-30 NOTE — Progress Notes (Signed)
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 AUG 2020-slight incresae in RP LN ~1cm; continue Ibrutinib; MARCH 2021-CT scan PET scan-progressive disease-question transformation [SUV 10-15]; patient declines biopsy-off ibrutinib.   # worsening Anemia- Jan 2021- Hb 8.5; STOP eliquis; FOBT- positive; referral to GI/pt declines  # DM/HTN/ CKD- stage III -----------------------------------------------   MOLECULAR TESTING: Trisomy 12. 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  Patient wife available at the appointment today.  [name] 69 y.o.  male above diagnosis of of SLL/CLL most recently ibrutinib; incidental bilateral PE on currently off Eliquis [secondary to worsening anemia] is here for follow-up.   In the interim patient was evaluated in the hospital for ongoing fevers/mental status changes.  patient was evaluated by ID-extensive infectious work-up was negative.  Fevers likely secondary to underlying lymphoma.  Patient also had a PET scan in the hospital that showed progressive disease.  Today patient is here for follow-up accompanied by his wife.  Patient seems to be agitated overall-and is not interested in having a discussion  about treatment options/course of the disease etc.  He seems to be fixated on IV access.    Review of Systems  Unable to perform ROS: Medical condition     MEDICAL HISTORY:  Past Medical History:  Diagnosis Date  . Anxiety   . Arthritis   . Cancer ([place])   . 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 HOH (hard of hearing)   . Hypertension   . Osteomyelitis of toe ([place]) 06/08/11   right foot  . Seasonal allergies   . Type II diabetes mellitus ([place])    diet controlled    SURGICAL HISTORY: Past Surgical History:  Procedure Laterality Date  . AMPUTATION  06/08/2011   Procedure: AMPUTATION RAY;  Surgeon: Wylene Simmer, MD;  Location: [facility];  Service: Orthopedics;  Laterality: Right;  Righ Hallux Amputation  . [place]   "crushed"  . FRACTURE SURGERY    . 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 PILONIDAL CYST / SINUS EXCISION  1970's  . TOE AMPUTATION  06/08/11   partial; right great toe    SOCIAL HISTORY: Social History   Socioeconomic History  . Marital status: Married    Spouse name: Not on file  . Number of children: Not on file  . Years of education: Not on file  . Highest education level: Not on file  Occupational History  . Not on file  Tobacco Use  . Smoking status: Former Smoker    Packs/day: 1.50    Years: 22.00    Pack years: 33.00    Types: Cigarettes    Quit date: 05/24/2011  Years since quitting: 8.0  . Smokeless tobacco: Former Systems developer    Types: [place] date: 03/07/1983  Substance and Sexual Activity  . Alcohol use: Yes    Comment: 06/08/11 "no liquor for 2 1/2 years; last beer 05/31/11"  . Drug use: No  . Sexual activity: Yes  Other Topics Concern  . Not on file  Social History Narrative   Married   Social Determinants of Health   Financial Resource Strain:   . Difficulty of Paying Living Expenses:   Food Insecurity:   . Worried About Charity fundraiser in the Last Year:   . Arboriculturist in the Last Year:    Transportation Needs:   . Film/video editor (Medical):   Marland Kitchen Lack of Transportation (Non-Medical):   Physical Activity:   . Days of Exercise per Week:   . Minutes of Exercise per Session:   Stress:   . Feeling of Stress :   Social Connections:   . Frequency of Communication with Friends and Family:   . Frequency of Social Gatherings with Friends and Family:   . Attends Religious Services:   . Active Member of Clubs or Organizations:   . Attends Archivist Meetings:   Marland Kitchen Marital Status:   Intimate Partner Violence:   . Fear of Current or Ex-Partner:   . Emotionally Abused:   Marland Kitchen Physically Abused:   . Sexually Abused:     FAMILY HISTORY: Family History  Problem Relation Age of Onset  . Prostate cancer Father   . Hypertension Father   . Other Mother        VARICOSE VEINS    ALLERGIES:  is allergic to penicillins.  MEDICATIONS:  Current Outpatient Medications  Medication Sig Dispense Refill  . fluticasone (FLONASE) 50 MCG/ACT nasal spray Place 2 sprays into both nostrils daily as needed for allergies or rhinitis.    . Iron-Vitamin C 65-125 MG TABS Take 1 tablet by mouth. VITRON C    . JANUVIA 100 MG tablet Take 100 mg by mouth at bedtime.   3  . mupirocin ointment (BACTROBAN) 2 % APPLY WITH Q TIP THREE TIMES DAILY AS NEEDED FOR DRYNESS AND CRUSTING  3  . Nutritional Supplements (FEEDING SUPPLEMENT, NEPRO CARB STEADY,) LIQD Take 237 mLs by mouth 3 (three) times daily between meals. 21330 mL 0  . pravastatin (PRAVACHOL) 40 MG tablet Take 40 mg by mouth at bedtime.     . tamsulosin (FLOMAX) 0.4 MG CAPS capsule Take 1 capsule (0.4 mg total) by mouth daily after breakfast. 30 capsule 0   No current facility-administered medications for this visit.      Marland Kitchen  PHYSICAL EXAMINATION: ECOG PERFORMANCE STATUS: 1 - Symptomatic but completely ambulatory  Vitals:   05/30/19 1425  BP: (!) 132/58  Pulse: 93  Temp: 97.6 F (36.4 C)   Filed Weights   05/30/19 1425   Weight: 173 lb (78.5 kg)    Physical Exam  Constitutional: He is well-developed, well-nourished, and in no distress.  Accompanied by his wife.  Appears confused.  In a wheelchair.  HENT:  Head: Normocephalic and atraumatic.  Mouth/Throat: Oropharynx is clear and moist. No oropharyngeal exudate.  Eyes: Pupils are equal, round, and reactive to light.  Cardiovascular: Normal rate and regular rhythm.  Pulmonary/Chest: Effort normal and breath sounds normal. No respiratory distress. He has no wheezes.  Abdominal: Soft. Bowel sounds are normal. He exhibits no distension and no mass. There is no  abdominal tenderness. There is no rebound and no guarding.  Musculoskeletal:        General: No tenderness or edema. Normal range of motion.     Cervical back: Normal range of motion and neck supple.  Lymphadenopathy:  Significant improvement of the lymphadenopathy in the neck/resolved; scattered approximately 1 cm few lymph nodes present in the right left underarm.  Neurological:  Alert but oriented x1-2.  Skin: Skin is warm.  Psychiatric: Affect normal.     LABORATORY DATA:  I have reviewed the data as listed Lab Results  Component Value Date   WBC 7.9 05/30/2019   HGB 8.5 (L) 05/30/2019   HCT 28.2 (L) 05/30/2019   MCV 72.1 (L) 05/30/2019   PLT 630 (H) 05/30/2019   Recent Labs    05/06/19 1816 05/06/19 1816 05/07/19 0451 05/08/19 0555 05/20/19 1033 05/21/19 0528 05/23/19 0537 05/24/19 0623 05/30/19 1354  NA 134*   < > 136   < > 135   < > 137 138 135  K 4.5   < > 3.9   < > 4.1   < > 3.6 3.0* 3.5  CL 103   < > 103   < > 101   < > 107 109 101  CO2 21*   < > 25   < > 24   < > 20* 20* 25  GLUCOSE 177*   < > 166*   < > 177*   < > 119* 153* 231*  BUN 31*   < > 29*   < > 32*   < > 38* 35* 31*  CREATININE 1.38*   < > 1.42*   < > 1.84*   < > 2.22* 2.29* 1.69*  CALCIUM 10.6*   < > 10.3   < > 9.7   < > 9.0 8.8* 8.9  GFRNONAA 53*   < > 51*   < > 37*   < > 30* 28* 41*  GFRAA >60   < >  59*   < > 43*   < > 34* 33* 48*  PROT 5.7*  --  6.3*  --  6.7  --   --   --   --   ALBUMIN 2.6*  --  2.6*  --  3.1*  --   --   --   --   AST 14*  --  11*  --  17  --   --   --   --   ALT 11  --  10  --  12  --   --   --   --   ALKPHOS 47  --  49  --  55  --   --   --   --   BILITOT 0.8  --  0.6  --  0.5  --   --   --   --   BILIDIR 0.2  --   --   --   --   --   --   --   --   IBILI 0.6  --   --   --   --   --   --   --   --    < > = values in this interval not displayed.    RADIOGRAPHIC STUDIES: I have personally reviewed the radiological images as listed and agreed with the findings in the report. CT ABDOMEN PELVIS WO CONTRAST  Result Date: 05/07/2019 CLINICAL DATA:  Hyperparathyroidism. Hypercalcemia. Small B-cell lymphoma, on immune therapy.  Pulmonary emboli in September. Altered mental status and fever. EXAM: CT CHEST, ABDOMEN AND PELVIS WITHOUT CONTRAST TECHNIQUE: Multidetector CT imaging of the chest, abdomen and pelvis was performed following the standard protocol without IV contrast. COMPARISON:  01/24/2019 FINDINGS: CT CHEST FINDINGS Cardiovascular: Multifactorial degradation, including motion in the lower chest, patient arm position (not raised above the head), and lack of IV contrast. Aortic and branch vessel atherosclerosis. Tortuous thoracic aorta. Mild cardiomegaly. Multivessel coronary artery atherosclerosis. Mediastinum/Nodes: Left supraclavicular index node measures 2.3 cm on 07/02, increased from 1.3 cm on the prior. Low left jugular nodes measure up to 1.8 cm today versus 1.4 cm on the prior. Relatively similar multiple bilateral axillary nodes. Left paratracheal node measures 1.2 cm on 22/2, newly enlarged since the prior. Hilar regions poorly evaluated without intravenous contrast. Lungs/Pleura: No pleural fluid. Right greater than left subpleural pulmonary nodules are similar, likely subpleural lymph nodes. Example at up to 6 mm on 72/4 in the right upper lobe. New superior  segment left lower lobe clustered nodular airspace and ground-glass opacity, including on 74/4. Musculoskeletal: Right upper extremity edema and subcutaneous gas are incompletely imaged, including on 38/2. Question intramuscular hematoma, including on 44/2. No acute osseous abnormality. CT ABDOMEN PELVIS FINDINGS Hepatobiliary: Multifactorial degradation continuing into the upper abdomen. Grossly normal noncontrast appearance of the liver, gallbladder. Pancreas: Grossly normal pancreas. Spleen: Normal in size, without focal abnormality. Adrenals/Urinary Tract: Normal adrenal glands. Favor renal vascular calcifications over renal calculi. No hydronephrosis. Bladder wall thickening is mild, at least partially felt to be due to underdistention. Stomach/Bowel: Grossly normal stomach. Colonic stool burden suggests constipation. Normal small bowel caliber. Vascular/Lymphatic: Aortic atherosclerosis. Bulky abdominal adenopathy. Index left periaortic node measures 2.7 x 3.6 cm on 77/2 versus 2.2 x 2.8 cm at the same level on the prior. A dominant left periaortic nodal mass measures maximally 6.7 cm on 93/2 today versus 5.5 cm on the prior exam (when remeasured). Index left external iliac node measures 3.3 cm on 116/2 and is similar to on the prior exam (when remeasured). Reproductive: Moderate prostatomegaly. Other: No significant free fluid. Musculoskeletal: No acute osseous abnormality. Lumbosacral junction degenerative disc disease. IMPRESSION: 1. Multifactorial degradation, including lack of IV contrast, motion, patient arm position. 2. Since 01/24/2019, development of left lower lobe nodular airspace disease, suspicious for infection or aspiration. 3. Right upper extremity edema and gas with possible intramuscular hematoma. Correlate with recent trauma or attempted IV placement. If no correlate history, deep venous thrombosis of the right upper extremity to would be a consideration and ultrasound suggested. 4.  Progressive lymphoma within the neck, chest, abdomen, as detailed above. Pelvic adenopathy is relatively similar. 5. Prostatomegaly. Apparent bladder wall thickening is at least partially felt to be due to underdistention. Correlate for possible bladder outlet obstruction. 6. Coronary artery atherosclerosis. Aortic Atherosclerosis (ICD10-I70.0). 7.  Possible constipation. Electronically Signed   By: Abigail Miyamoto M.D.   On: 05/07/2019 16:57   DG Chest 1 View  Result Date: 05/20/2019 CLINICAL DATA:  Fever, altered mental status EXAM: CHEST  1 VIEW COMPARISON:  None. FINDINGS: The heart size and mediastinal contours are within normal limits. Both lungs are clear. No pleural effusion or pneumothorax. The visualized skeletal structures are unremarkable. IMPRESSION: No acute process in the chest Electronically Signed   By: Macy Mis M.D.   On: 05/20/2019 10:57   DG Chest 2 View  Result Date: 05/22/2019 CLINICAL DATA:  Fever EXAM: CHEST - 2 VIEW COMPARISON:  May 20, 2019 FINDINGS: Lungs are  clear. Heart size and pulmonary vascularity are normal. No adenopathy. There is degenerative change in the lower thoracic spine. There is a skin fold on the right. No appreciable pneumothorax. IMPRESSION: Lungs clear. Cardiac silhouette within normal limits. Apparent skin fold on the right. Electronically Signed   By: Lowella Grip III M.D.   On: 05/22/2019 08:43   CT HEAD WO CONTRAST  Result Date: 05/20/2019 CLINICAL DATA:  Patient with lymphoma undergoing treatment. Cephalopathy. EXAM: CT HEAD WITHOUT CONTRAST TECHNIQUE: Contiguous axial images were obtained from the base of the skull through the vertex without intravenous contrast. 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 Cerebral hemispheres show chronic small-vessel change of the white matter. No sign of acute infarction, mass lesion, hemorrhage, hydrocephalus or extra-axial collection. Vascular: There is  atherosclerotic calcification of the major vessels at the base of the brain. Skull: Negative Sinuses/Orbits: Clear/normal Other: None IMPRESSION: No acute finding. Chronic small-vessel change of the cerebral hemispheric white matter. Old small vessel cerebellar infarctions. Electronically Signed   By: Nelson Chimes M.D.   On: 05/20/2019 19:56   CT Head Wo Contrast  Result Date: 05/06/2019 CLINICAL DATA:  Altered mental status, unclear cause EXAM: CT HEAD WITHOUT CONTRAST TECHNIQUE: Contiguous axial images were obtained from the base of the skull through the vertex without intravenous contrast. COMPARISON:  None. 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 No CT evident large vascular territory or cortically based infarction is seen. No evidence of acute, hemorrhage, hydrocephalus, extra-axial collection or mass lesion/mass effect. Patchy areas of white matter hypoattenuation are most compatible with chronic microvascular angiopathy. Symmetric prominence of the ventricles, cisterns and sulci compatible with parenchymal volume loss. Vascular: Atherosclerotic calcification of the carotid siphons and intradural vertebral arteries. No hyperdense vessel. Skull: No calvarial fracture or suspicious osseous lesion. No scalp swelling or hematoma. Vascular calcium of the scalp, often seen in diabetes. Sinuses/Orbits: Paranasal sinuses and mastoid air cells are predominantly clear. Included orbital structures are unremarkable. Other: None IMPRESSION: 1. Multiple acquisition required due to patient motion artifact. 2. No convincing CT evidence of acute intracranial abnormality. 3. 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 4. Chronic microangiopathic changes with parenchymal volume loss. Intracranial atherosclerosis. Electronically Signed   By: Lovena Le M.D.   On: 05/06/2019 21:15   CT CHEST WO CONTRAST  Result Date:  05/07/2019 CLINICAL DATA:  Hyperparathyroidism. Hypercalcemia. Small B-cell lymphoma, on immune therapy. Pulmonary emboli in September. Altered mental status and fever. EXAM: CT CHEST, ABDOMEN AND PELVIS WITHOUT CONTRAST TECHNIQUE: Multidetector CT imaging of the chest, abdomen and pelvis was performed following the standard protocol without IV contrast. COMPARISON:  01/24/2019 FINDINGS: CT CHEST FINDINGS Cardiovascular: Multifactorial degradation, including motion in the lower chest, patient arm position (not raised above the head), and lack of IV contrast. Aortic and branch vessel atherosclerosis. Tortuous thoracic aorta. Mild cardiomegaly. Multivessel coronary artery atherosclerosis. Mediastinum/Nodes: Left supraclavicular index node measures 2.3 cm on 07/02, increased from 1.3 cm on the prior. Low left jugular nodes measure up to 1.8 cm today versus 1.4 cm on the prior. Relatively similar multiple bilateral axillary nodes. Left paratracheal node measures 1.2 cm on 22/2, newly enlarged since the prior. Hilar regions poorly evaluated without intravenous contrast. Lungs/Pleura: No pleural fluid. Right greater than left subpleural pulmonary nodules are similar, likely subpleural lymph nodes. Example at up to 6 mm on 72/4 in the right upper lobe. New superior segment left lower lobe  clustered nodular airspace and ground-glass opacity, including on 74/4. Musculoskeletal: Right upper extremity edema and subcutaneous gas are incompletely imaged, including on 38/2. Question intramuscular hematoma, including on 44/2. No acute osseous abnormality. CT ABDOMEN PELVIS FINDINGS Hepatobiliary: Multifactorial degradation continuing into the upper abdomen. Grossly normal noncontrast appearance of the liver, gallbladder. Pancreas: Grossly normal pancreas. Spleen: Normal in size, without focal abnormality. Adrenals/Urinary Tract: Normal adrenal glands. Favor renal vascular calcifications over renal calculi. No hydronephrosis.  Bladder wall thickening is mild, at least partially felt to be due to underdistention. Stomach/Bowel: Grossly normal stomach. Colonic stool burden suggests constipation. Normal small bowel caliber. Vascular/Lymphatic: Aortic atherosclerosis. Bulky abdominal adenopathy. Index left periaortic node measures 2.7 x 3.6 cm on 77/2 versus 2.2 x 2.8 cm at the same level on the prior. A dominant left periaortic nodal mass measures maximally 6.7 cm on 93/2 today versus 5.5 cm on the prior exam (when remeasured). Index left external iliac node measures 3.3 cm on 116/2 and is similar to on the prior exam (when remeasured). Reproductive: Moderate prostatomegaly. Other: No significant free fluid. Musculoskeletal: No acute osseous abnormality. Lumbosacral junction degenerative disc disease. IMPRESSION: 1. Multifactorial degradation, including lack of IV contrast, motion, patient arm position. 2. Since 01/24/2019, development of left lower lobe nodular airspace disease, suspicious for infection or aspiration. 3. Right upper extremity edema and gas with possible intramuscular hematoma. Correlate with recent trauma or attempted IV placement. If no correlate history, deep venous thrombosis of the right upper extremity to would be a consideration and ultrasound suggested. 4. Progressive lymphoma within the neck, chest, abdomen, as detailed above. Pelvic adenopathy is relatively similar. 5. Prostatomegaly. Apparent bladder wall thickening is at least partially felt to be due to underdistention. Correlate for possible bladder outlet obstruction. 6. Coronary artery atherosclerosis. Aortic Atherosclerosis (ICD10-I70.0). 7.  Possible constipation. Electronically Signed   By: Abigail Miyamoto M.D.   On: 05/07/2019 16:57   NM PET Image Restag (PS) Skull Base To Thigh  Result Date: 05/23/2019 CLINICAL DATA:  Subsequent treatment strategy for small B-cell lymphoma, on immune therapy. EXAM: NUCLEAR MEDICINE PET SKULL BASE TO THIGH TECHNIQUE: 9.4  mCi F-18 FDG was injected intravenously. Full-ring PET imaging was performed from the skull base to thigh after the radiotracer. CT data was obtained and used for attenuation correction and anatomic localization. Fasting blood glucose: 123 mg/dl COMPARISON:  01/01/2018 FINDINGS: Mediastinal blood pool activity: SUV max 1.9 Liver activity: SUV max 2.8 NECK: Most of the bulky bilateral Deauville 4 neck adenopathy shown on the PET-CT from 01/01/2018 has resolved, but as noted on recent diagnostic CT examinations, has been a progressive recent increase in left level IV and left level V lymph nodes including a left level IV node measuring 1.7 cm on image 67/3 with maximum SUV of 11.7 (Deauville 5) and a left level V lymph node measuring 2.1 cm on image 66/3 with maximum SUV 7.3, Deauville 5. Incidental CT findings: Bilateral common carotid atherosclerotic calcification is noted. CHEST: Left supraclavicular adenopathy is present and includes a 1.6 cm node on image 75/3 with maximum SUV 9.4 (Deauville 5). On prior PET-CT this node had a maximum SUV of 3.6. Although the bulky axillary adenopathy shown on the prior chest CT has mostly resolved, there are some residual axillary lymph nodes including a 1.1 cm right axillary node on image 94/3 with maximum SUV 1.7, Deauville 2. Incidental CT findings: Coronary, aortic arch, and branch vessel atherosclerotic vascular disease. Mild cardiomegaly. Trace bilateral pleural effusions. There is some new reticulonodular  and patchy airspace opacity medially in the right lower lobe on image 118/3, not present on 05/07/2019. On the other hand, some of the airspace opacity in the left lower lobe has improved, with residual nodular sub solid densities in the left lower lobe but with the more consolidative region shown earlier this month resolved. The appearance could be from aspiration pneumonitis or atypical pneumonia. ABDOMEN/PELVIS: Bulky Deauville 5 periaortic/retroperitoneal adenopathy  extending down into the left common iliac and external iliac chains. An index left periaortic lymph node measuring 5.8 cm in short axis on image 219/3 (previously 5.2 cm on 01/01/2018) has a maximum SUV of 14.9, Deauville 5 (formerly 10.5, likewise Deauville 5). However, many of the other periaortic lymph nodes shown on the prior PET-CT were Deauville 4 previously, and are now Deauville 5. A left external iliac/pelvic sidewall node measures 3.6 cm in short axis on image 260/3 (formerly 6.5 cm) with maximum SUV 9.3, Deauville 5 (formerly 7.6). The previously bulky inguinal adenopathy has resolved. The previously bulky right iliac adenopathy has resolved. No splenomegaly or abnormal splenic activity. Incidental CT findings: Aortoiliac atherosclerotic vascular disease. Mesenteric, retroperitoneal, and omental infiltrative edema. There is an air-level in the rectum which may indicate a diarrheal process. Large left scrotal hydrocele. Prostatomegaly. SKELETON: Left antecubital activity is injection related. No abnormal skeletal activity is observed. Incidental CT findings: Old healed right anterior rib fractures. Cervical, thoracic, and lumbar spondylosis. IMPRESSION: 1. Although much of the bulky adenopathy in the neck, chest, and abdomen/pelvis has resolved compared to the prior PET-CT of 01/01/2018, today's exam and interval CT exams reveal that there is recurrent adenopathy in the left lower neck, left supraclavicular region, in the retroperitoneum, and in the left hemipelvis which is higher in metabolic activity than that shown on prior PET-CT (currently Deauville 5, previously ranging from Deauville 2 through Deauville 5). 2. There is some scattered residual axillary lymph nodes, currently Deauville 2 activity. 3. Patchy airspace opacities in the lower lobes, increased on the right but reduced on the left, possibly from atypical pneumonia or aspiration pneumonitis. 4. Air fluid level in the rectum favoring  diarrheal process. 5. Other imaging findings of potential clinical significance: Aortic Atherosclerosis (ICD10-I70.0). Coronary atherosclerosis. Mild cardiomegaly. Trace bilateral pleural effusions. Mesenteric edema. Large left scrotal hydrocele. Prostatomegaly. Electronically Signed   By: Van Clines M.D.   On: 05/23/2019 13:25   US Venous Img Upper Uni Right(DVT)  Result Date: 05/21/2019 CLINICAL DATA:  69 year old with right upper extremity swelling. EXAM: RIGHT UPPER EXTREMITY VENOUS DOPPLER ULTRASOUND TECHNIQUE: Gray-scale sonography with graded compression, as well as color Doppler and duplex ultrasound were performed to evaluate the upper extremity deep venous system from the level of the subclavian vein and including the jugular, axillary, basilic, radial, ulnar and upper cephalic vein. Spectral Doppler was utilized to evaluate flow at rest and with distal augmentation maneuvers. COMPARISON:  None. FINDINGS: Contralateral Subclavian Vein: Normal color Doppler flow and phasicity. Internal Jugular Vein: No evidence of thrombus. Normal compressibility, color Doppler flow and phasicity. Subclavian Vein: No evidence of thrombus. Normal color Doppler flow and phasicity. Axillary Vein: No evidence of thrombus. Normal compressibility, color Doppler flow and augmentation. Cephalic Vein: No evidence of thrombus. Normal compressibility, color Doppler flow and augmentation. Basilic Vein: No evidence of thrombus. Normal compressibility, color Doppler flow and augmentation. Brachial Veins: No evidence of thrombus. Normal compressibility, color Doppler flow and augmentation. Radial Veins: No evidence of thrombus. Normal compressibility and color Doppler flow. Ulnar Veins: No evidence of thrombus. Normal  compressibility and color Doppler flow. Other Findings: No discrete abnormality at the area of concern in the right forearm. IMPRESSION: No evidence of DVT within the right upper extremity. Electronically Signed    By: Markus Daft M.D.   On: 05/21/2019 20:46   DG Chest Portable 1 View  Result Date: 05/06/2019 CLINICAL DATA:  Fevers and altered mental status, history of lymphoma EXAM: PORTABLE CHEST 1 VIEW COMPARISON:  01/24/2019 CT, plain film from 11/13/2017 FINDINGS: Cardiac shadow is within normal limits. The lungs are well aerated bilaterally. No focal infiltrate or sizable effusion is seen. Aortic calcifications are noted. No acute bony abnormality is noted. Exuberant calcification at the first costochondral margins is noted bilaterally. IMPRESSION: No acute abnormality noted. Electronically Signed   By: Inez Catalina M.D.   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  Currently off ibrutinib because of progression of disease noted.  CT scan March 2020-progressive lymphadenopathy in the chest/abdomen pelvis.  PET scan March 2020 [inpatient]-progressive disease mostly in the abdomen pelvis SUV around 10-15.  #Patient seems to be quite symptomatic from his progressive disease [recent admission for hypercalcemia/fevers etc.].  However patient not sure if he wants to proceed with any therapy.  Patient seems to have poor understanding of the situation in spite of multiple attempts by myself and his wife explained the seriousness of the situation.  Patient seems to be focused on IV access more than anything else [see below].  #Long discussion with patient and wife regarding-concerns for transformation to a higher grade lymphoma-however not confirmed given patient's reluctance to proceed with any invasive procedures.  Discussed with wife that if it is truly transformation-usually difficult to cure which would need aggressive measures like intensive chemotherapy/stem cell transplant evaluation.  However given patient's age and reluctance-less aggressive approaches could be considered however would  still include IV chemotherapy [mini R-CHOP versus R bendamustine].  Even after lengthy discussion patient declines to make any commitment whether he wants to go for therapy or not.  #Low-grade fevers-likely secondary to underlying lymphoma.  #Hypercalcemia-concerning for malignancy related-mental status changes; s/p Zometa improved.  #Anemia-hemoglobin 8.3- stable;  -iron deficiency/ FOBT positive-currently off Eliquis given all acute issues.  #Bilateral PE [sep 2019]-HELD Eliquis [jan 18th 2020- held- sec to worsening anemia].  Off Eliquis.  #IV access-discussed that we could get a port if patient is interested with treatment.  No decisions made yet.  #Prognosis-unfortunately very poor given the progressive disease noted on the imaging-hypercalcemia/fevers etc.  This is complicated by patient's reluctance with treatment recommendations.  Discussed with even the treatment life expectancy in the order of 1 to 2 years.  Without treatment-the order of few months.  Discussed regarding palliative care evaluation.  Interested.   DISPOSITION: # No venofer today # Josh in 2 weeks. # follow up in 2 weeks- MD; labs- cbc/cmp; possible Venofer-Dr.B       All questions were answered. The patient knows to call the clinic with any problems, questions or concerns.     Cammie Sickle, MD 06/04/2019 8:35 AM

## 2019-05-30 NOTE — Assessment & Plan Note (Addendum)
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  Currently off ibrutinib because of progression of disease noted.  CT scan March 2020-progressive lymphadenopathy in the chest/abdomen pelvis.  PET scan March 2020 [inpatient]-progressive disease mostly in the abdomen pelvis SUV around 10-15.  #Patient seems to be quite symptomatic from his progressive disease [recent admission for hypercalcemia/fevers etc.].  However patient not sure if he wants to proceed with any therapy.  Patient seems to have poor understanding of the situation in spite of multiple attempts by myself and his wife explained the seriousness of the situation.  Patient seems to be focused on IV access more than anything else [see below].  #Long discussion with patient and wife regarding-concerns for transformation to a higher grade lymphoma-however not confirmed given patient's reluctance to proceed with any invasive procedures.  Discussed with wife that if it is truly transformation-usually difficult to cure which would need aggressive measures like intensive chemotherapy/stem cell transplant evaluation.  However given patient's age and reluctance-less aggressive approaches could be considered however would still include IV chemotherapy [mini R-CHOP versus R bendamustine].  Even after lengthy discussion patient declines to make any commitment whether he wants to go for therapy or not.  #Low-grade fevers-likely secondary to underlying lymphoma.  #Hypercalcemia-concerning for malignancy related-mental status changes; s/p Zometa improved.  #Anemia-hemoglobin 8.3- stable;  -iron deficiency/ FOBT positive-currently off Eliquis given all acute issues.  #Bilateral PE [sep 2019]-HELD Eliquis [jan 18th 2020- held- sec to worsening anemia].  Off Eliquis.  #IV access-discussed that we could get a port if patient is interested with treatment.  No decisions made yet.  #Prognosis-unfortunately very poor given the progressive disease  noted on the imaging-hypercalcemia/fevers etc.  This is complicated by patient's reluctance with treatment recommendations.  Discussed with even the treatment life expectancy in the order of 1 to 2 years.  Without treatment-the order of few months.  Discussed regarding palliative care evaluation.  Interested.   DISPOSITION: # No venofer today # Josh in 2 weeks. # follow up in 2 weeks- MD; labs- cbc/cmp; possible Venofer-Dr.B

## 2019-06-12 ENCOUNTER — Other Ambulatory Visit: Payer: Self-pay | Admitting: *Deleted

## 2019-06-12 DIAGNOSIS — C8308 Small cell B-cell lymphoma, lymph nodes of multiple sites: Secondary | ICD-10-CM

## 2019-06-13 ENCOUNTER — Encounter: Payer: Self-pay | Admitting: Internal Medicine

## 2019-06-13 ENCOUNTER — Inpatient Hospital Stay: Payer: Medicare Other

## 2019-06-13 ENCOUNTER — Inpatient Hospital Stay: Payer: Medicare Other | Attending: Internal Medicine

## 2019-06-13 ENCOUNTER — Inpatient Hospital Stay (HOSPITAL_BASED_OUTPATIENT_CLINIC_OR_DEPARTMENT_OTHER): Payer: Medicare Other | Admitting: Internal Medicine

## 2019-06-13 ENCOUNTER — Other Ambulatory Visit: Payer: Self-pay

## 2019-06-13 VITALS — BP 102/58 | HR 84 | Temp 96.8°F | Resp 18

## 2019-06-13 DIAGNOSIS — C8308 Small cell B-cell lymphoma, lymph nodes of multiple sites: Secondary | ICD-10-CM

## 2019-06-13 DIAGNOSIS — D500 Iron deficiency anemia secondary to blood loss (chronic): Secondary | ICD-10-CM

## 2019-06-13 DIAGNOSIS — D509 Iron deficiency anemia, unspecified: Secondary | ICD-10-CM | POA: Diagnosis not present

## 2019-06-13 LAB — COMPREHENSIVE METABOLIC PANEL
ALT: 122 U/L — ABNORMAL HIGH (ref 0–44)
AST: 98 U/L — ABNORMAL HIGH (ref 15–41)
Albumin: 2.5 g/dL — ABNORMAL LOW (ref 3.5–5.0)
Alkaline Phosphatase: 131 U/L — ABNORMAL HIGH (ref 38–126)
Anion gap: 12 (ref 5–15)
BUN: 42 mg/dL — ABNORMAL HIGH (ref 8–23)
CO2: 25 mmol/L (ref 22–32)
Calcium: 8.9 mg/dL (ref 8.9–10.3)
Chloride: 98 mmol/L (ref 98–111)
Creatinine, Ser: 1.43 mg/dL — ABNORMAL HIGH (ref 0.61–1.24)
GFR calc Af Amer: 58 mL/min — ABNORMAL LOW (ref >60)
GFR calc non Af Amer: 50 mL/min — ABNORMAL LOW (ref >60)
Glucose, Bld: 225 mg/dL — ABNORMAL HIGH (ref 70–99)
Potassium: 3.6 mmol/L (ref 3.5–5.1)
Sodium: 135 mmol/L (ref 135–145)
Total Bilirubin: 0.7 mg/dL (ref 0.3–1.2)
Total Protein: 6.3 g/dL — ABNORMAL LOW (ref 6.5–8.1)

## 2019-06-13 LAB — CBC WITH DIFFERENTIAL/PLATELET
Abs Immature Granulocytes: 0.02 10*3/uL (ref 0.00–0.07)
Basophils Absolute: 0 10*3/uL (ref 0.0–0.1)
Basophils Relative: 0 %
Eosinophils Absolute: 0 10*3/uL (ref 0.0–0.5)
Eosinophils Relative: 0 %
HCT: 27.6 % — ABNORMAL LOW (ref 39.0–52.0)
Hemoglobin: 8.1 g/dL — ABNORMAL LOW (ref 13.0–17.0)
Immature Granulocytes: 0 %
Lymphocytes Relative: 14 %
Lymphs Abs: 0.6 10*3/uL — ABNORMAL LOW (ref 0.7–4.0)
MCH: 21.1 pg — ABNORMAL LOW (ref 26.0–34.0)
MCHC: 29.3 g/dL — ABNORMAL LOW (ref 30.0–36.0)
MCV: 71.9 fL — ABNORMAL LOW (ref 80.0–100.0)
Monocytes Absolute: 0.5 10*3/uL (ref 0.1–1.0)
Monocytes Relative: 10 %
Neutro Abs: 3.5 10*3/uL (ref 1.7–7.7)
Neutrophils Relative %: 76 %
Platelets: 515 10*3/uL — ABNORMAL HIGH (ref 150–400)
RBC: 3.84 MIL/uL — ABNORMAL LOW (ref 4.22–5.81)
RDW: 20.9 % — ABNORMAL HIGH (ref 11.5–15.5)
WBC: 4.6 10*3/uL (ref 4.0–10.5)
nRBC: 0 % (ref 0.0–0.2)

## 2019-06-13 MED ORDER — IRON SUCROSE 20 MG/ML IV SOLN
200.0000 mg | Freq: Once | INTRAVENOUS | Status: AC
  Administered 2019-06-13: 15:00:00 200 mg via INTRAVENOUS
  Filled 2019-06-13: qty 10

## 2019-06-13 MED ORDER — SODIUM CHLORIDE 0.9 % IV SOLN
Freq: Once | INTRAVENOUS | Status: AC
  Filled 2019-06-13: qty 250

## 2019-06-13 NOTE — Progress Notes (Signed)
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 STAGE- III/ IV; PET scan- bulky LN [Dr.Vaught] above/below Diaphragm; No spleen/liver/bone;   # 22nd October 2019- Bil PE on lovenox xstopped; NOV 2019- Eliquis 5 mg BID. AUG 2020-slight incresae in RP LN ~1cm; continue Ibrutinib; MARCH 2021-CT scan PET scan-progressive disease-question transformation [SUV 10-15]; patient declines biopsy-off ibrutinib.   # worsening Anemia- Jan 2021- Hb 8.5; STOP eliquis; FOBT- positive; referral to GI/pt declines  # DM/HTN/ CKD- stage III -----------------------------------------------   MOLECULAR TESTING: Trisomy 12. 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  Patient wife available at the appointment today.  [name] 69 y.o.  male above diagnosis of of SLL/CLL currently off ibrutinib given progressive disease; history of bilateral PE on currently off Eliquis [secondary to worsening anemia] is here for follow-up.   In the interim patient has not had any hospitalizations.  As per the wife patient continues to have intermittent fevers.  For which he has been taking NSAIDs.  As per the wife patient continues to feel weak; fatigue.  Poor appetite.   Review of Systems  Unable to perform ROS: Medical condition     MEDICAL HISTORY:  Past Medical History:  Diagnosis Date  . Anxiety   . Arthritis   . Cancer ([place])    . 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 HOH (hard of hearing)   . Hypertension   . Osteomyelitis of toe ([place]) 06/08/11   right foot  . Seasonal allergies   . Type II diabetes mellitus ([place])    diet controlled    SURGICAL HISTORY: Past Surgical History:  Procedure Laterality Date  . AMPUTATION  06/08/2011   Procedure: AMPUTATION RAY;  Surgeon: Wylene Simmer, MD;  Location: [facility];  Service: Orthopedics;  Laterality: Right;  [place] Amputation  . [place]   "crushed"  . FRACTURE SURGERY    . 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 PILONIDAL CYST / SINUS EXCISION  1970's  . TOE AMPUTATION  06/08/11   partial; right great toe    SOCIAL HISTORY: Social History   Socioeconomic History  . Marital status: Married    Spouse name: Not on file  . Number of children: Not on file  . Years of education: Not on file  . Highest education level: Not on file  Occupational History  . Not on file  Tobacco Use  . Smoking status: Former Smoker    Packs/day: 1.50    Years: 22.00    Pack years: 33.00    Types: Cigarettes    Quit date: 05/24/2011    Years since quitting: 8.0  . Smokeless tobacco: Former Systems developer    Types: [place] date: 03/07/1983  Substance and Sexual Activity  . Alcohol use: Yes    Comment: 06/08/11 "no liquor for 2 1/2  years; last beer 05/31/11"  . Drug use: No  . Sexual activity: Yes  Other Topics Concern  . Not on file  Social History Narrative   Married   Social Determinants of Health   Financial Resource Strain:   . Difficulty of Paying Living Expenses:   Food Insecurity:   . Worried About Charity fundraiser in the Last Year:   . Arboriculturist in the Last Year:   Transportation Needs:   . Film/video editor (Medical):   Marland Kitchen Lack of Transportation (Non-Medical):   Physical Activity:   . Days of Exercise per Week:   . Minutes of Exercise per Session:   Stress:   . Feeling of Stress :   Social Connections:   . Frequency  of Communication with Friends and Family:   . Frequency of Social Gatherings with Friends and Family:   . Attends Religious Services:   . Active Member of Clubs or Organizations:   . Attends Archivist Meetings:   Marland Kitchen Marital Status:   Intimate Partner Violence:   . Fear of Current or Ex-Partner:   . Emotionally Abused:   Marland Kitchen Physically Abused:   . Sexually Abused:     FAMILY HISTORY: Family History  Problem Relation Age of Onset  . Prostate cancer Father   . Hypertension Father   . Other Mother        VARICOSE VEINS    ALLERGIES:  is allergic to penicillins.  MEDICATIONS:  Current Outpatient Medications  Medication Sig Dispense Refill  . docusate sodium (COLACE) 100 MG capsule Take 100 mg by mouth daily as needed for mild constipation.    . fluticasone (FLONASE) 50 MCG/ACT nasal spray Place 2 sprays into both nostrils daily as needed for allergies or rhinitis.    . Iron-Vitamin C 65-125 MG TABS Take 1 tablet by mouth. VITRON C    . JANUVIA 100 MG tablet Take 100 mg by mouth at bedtime.   3  . mupirocin ointment (BACTROBAN) 2 % APPLY WITH Q TIP THREE TIMES DAILY AS NEEDED FOR DRYNESS AND CRUSTING  3  . Nutritional Supplements (FEEDING SUPPLEMENT, NEPRO CARB STEADY,) LIQD Take 237 mLs by mouth 3 (three) times daily between meals. 21330 mL 0  . pravastatin (PRAVACHOL) 40 MG tablet Take 40 mg by mouth at bedtime.     . tamsulosin (FLOMAX) 0.4 MG CAPS capsule Take 1 capsule (0.4 mg total) by mouth daily after breakfast. 30 capsule 0  . vitamin B-12 (CYANOCOBALAMIN) 1000 MCG tablet Take 1,000 mcg by mouth daily.    Marland Kitchen escitalopram (LEXAPRO) 10 MG tablet May take 1/2 tablet (24m) by mouth daily x 1 week and then increase to 1 tablet (132m daily if tolerated 30 tablet 1   No current facility-administered medications for this visit.      . Marland KitchenPHYSICAL EXAMINATION: ECOG PERFORMANCE STATUS: 1 - Symptomatic but completely ambulatory  Vitals:   06/13/19 1345  BP: (!) 93/53   Pulse: 86  Resp: 20  Temp: (!) 95.8 F (35.4 C)   There were no vitals filed for this visit.  Physical Exam  Constitutional: He is oriented to person, place, and time.  Accompanied by his wife.  In a wheelchair.  HENT:  Head: Normocephalic and atraumatic.  Mouth/Throat: Oropharynx is clear and moist. No oropharyngeal exudate.  Eyes: Pupils are equal, round, and reactive to light.  Cardiovascular: Normal rate and regular rhythm.  Pulmonary/Chest: Effort normal and breath sounds normal. No respiratory  distress. He has no wheezes.  Abdominal: Soft. Bowel sounds are normal. He exhibits no distension and no mass. There is no abdominal tenderness. There is no rebound and no guarding.  Musculoskeletal:        General: No tenderness or edema. Normal range of motion.     Cervical back: Normal range of motion and neck supple.  Lymphadenopathy:  Significant improvement of the lymphadenopathy in the neck/resolved; scattered approximately 1 cm few lymph nodes present in the right left underarm.  Neurological: He is alert and oriented to person, place, and time.  Skin: Skin is warm.  Psychiatric: Affect normal.     LABORATORY DATA:  I have reviewed the data as listed Lab Results  Component Value Date   WBC 4.6 06/13/2019   HGB 8.1 (L) 06/13/2019   HCT 27.6 (L) 06/13/2019   MCV 71.9 (L) 06/13/2019   PLT 515 (H) 06/13/2019   Recent Labs    05/06/19 1816 05/06/19 1816 05/07/19 0451 05/08/19 0555 05/20/19 1033 05/21/19 0528 05/24/19 0623 05/30/19 1354 06/13/19 1312  NA 134*   < > 136   < > 135   < > 138 135 135  K 4.5   < > 3.9   < > 4.1   < > 3.0* 3.5 3.6  CL 103   < > 103   < > 101   < > 109 101 98  CO2 21*   < > 25   < > 24   < > 20* 25 25  GLUCOSE 177*   < > 166*   < > 177*   < > 153* 231* 225*  BUN 31*   < > 29*   < > 32*   < > 35* 31* 42*  CREATININE 1.38*   < > 1.42*   < > 1.84*   < > 2.29* 1.69* 1.43*  CALCIUM 10.6*   < > 10.3   < > 9.7   < > 8.8* 8.9 8.9  GFRNONAA  53*   < > 51*   < > 37*   < > 28* 41* 50*  GFRAA >60   < > 59*   < > 43*   < > 33* 48* 58*  PROT 5.7*   < > 6.3*  --  6.7  --   --   --  6.3*  ALBUMIN 2.6*   < > 2.6*  --  3.1*  --   --   --  2.5*  AST 14*   < > 11*  --  17  --   --   --  98*  ALT 11   < > 10  --  12  --   --   --  122*  ALKPHOS 47   < > 49  --  55  --   --   --  131*  BILITOT 0.8   < > 0.6  --  0.5  --   --   --  0.7  BILIDIR 0.2  --   --   --   --   --   --   --   --   IBILI 0.6  --   --   --   --   --   --   --   --    < > = values in this interval not displayed.    RADIOGRAPHIC STUDIES: I have personally reviewed the radiological images as listed and agreed with the findings in the  report. DG Chest 2 View  Result Date: 05/22/2019 CLINICAL DATA:  Fever EXAM: CHEST - 2 VIEW COMPARISON:  May 20, 2019 FINDINGS: Lungs are clear. Heart size and pulmonary vascularity are normal. No adenopathy. There is degenerative change in the lower thoracic spine. There is a skin fold on the right. No appreciable pneumothorax. IMPRESSION: Lungs clear. Cardiac silhouette within normal limits. Apparent skin fold on the right. Electronically Signed   By: Lowella Grip III M.D.   On: 05/22/2019 08:43   CT HEAD WO CONTRAST  Result Date: 05/20/2019 CLINICAL DATA:  Patient with lymphoma undergoing treatment. Cephalopathy. EXAM: CT HEAD WITHOUT CONTRAST TECHNIQUE: Contiguous axial images were obtained from the base of the skull through the vertex without intravenous contrast. 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 Cerebral hemispheres show chronic small-vessel change of the white matter. No sign of acute infarction, mass lesion, hemorrhage, hydrocephalus or extra-axial collection. Vascular: There is atherosclerotic calcification of the major vessels at the base of the brain. Skull: Negative Sinuses/Orbits: Clear/normal Other: None IMPRESSION: No acute finding. Chronic small-vessel change of  the cerebral hemispheric white matter. Old small vessel cerebellar infarctions. Electronically Signed   By: Nelson Chimes M.D.   On: 05/20/2019 19:56   NM PET Image Restag (PS) Skull Base To Thigh  Result Date: 05/23/2019 CLINICAL DATA:  Subsequent treatment strategy for small B-cell lymphoma, on immune therapy. EXAM: NUCLEAR MEDICINE PET SKULL BASE TO THIGH TECHNIQUE: 9.4 mCi F-18 FDG was injected intravenously. Full-ring PET imaging was performed from the skull base to thigh after the radiotracer. CT data was obtained and used for attenuation correction and anatomic localization. Fasting blood glucose: 123 mg/dl COMPARISON:  01/01/2018 FINDINGS: Mediastinal blood pool activity: SUV max 1.9 Liver activity: SUV max 2.8 NECK: Most of the bulky bilateral Deauville 4 neck adenopathy shown on the PET-CT from 01/01/2018 has resolved, but as noted on recent diagnostic CT examinations, has been a progressive recent increase in left level IV and left level V lymph nodes including a left level IV node measuring 1.7 cm on image 67/3 with maximum SUV of 11.7 (Deauville 5) and a left level V lymph node measuring 2.1 cm on image 66/3 with maximum SUV 7.3, Deauville 5. Incidental CT findings: Bilateral common carotid atherosclerotic calcification is noted. CHEST: Left supraclavicular adenopathy is present and includes a 1.6 cm node on image 75/3 with maximum SUV 9.4 (Deauville 5). On prior PET-CT this node had a maximum SUV of 3.6. Although the bulky axillary adenopathy shown on the prior chest CT has mostly resolved, there are some residual axillary lymph nodes including a 1.1 cm right axillary node on image 94/3 with maximum SUV 1.7, Deauville 2. Incidental CT findings: Coronary, aortic arch, and branch vessel atherosclerotic vascular disease. Mild cardiomegaly. Trace bilateral pleural effusions. There is some new reticulonodular and patchy airspace opacity medially in the right lower lobe on image 118/3, not present on  05/07/2019. On the other hand, some of the airspace opacity in the left lower lobe has improved, with residual nodular sub solid densities in the left lower lobe but with the more consolidative region shown earlier this month resolved. The appearance could be from aspiration pneumonitis or atypical pneumonia. ABDOMEN/PELVIS: Bulky Deauville 5 periaortic/retroperitoneal adenopathy extending down into the left common iliac and external iliac chains. An index left periaortic lymph node measuring 5.8 cm in short axis on image 219/3 (previously 5.2 cm on 01/01/2018) has a maximum SUV of 14.9, Deauville 5 (  formerly 10.5, likewise Deauville 5). However, many of the other periaortic lymph nodes shown on the prior PET-CT were Deauville 4 previously, and are now Deauville 5. A left external iliac/pelvic sidewall node measures 3.6 cm in short axis on image 260/3 (formerly 6.5 cm) with maximum SUV 9.3, Deauville 5 (formerly 7.6). The previously bulky inguinal adenopathy has resolved. The previously bulky right iliac adenopathy has resolved. No splenomegaly or abnormal splenic activity. Incidental CT findings: Aortoiliac atherosclerotic vascular disease. Mesenteric, retroperitoneal, and omental infiltrative edema. There is an air-level in the rectum which may indicate a diarrheal process. Large left scrotal hydrocele. Prostatomegaly. SKELETON: Left antecubital activity is injection related. No abnormal skeletal activity is observed. Incidental CT findings: Old healed right anterior rib fractures. Cervical, thoracic, and lumbar spondylosis. IMPRESSION: 1. Although much of the bulky adenopathy in the neck, chest, and abdomen/pelvis has resolved compared to the prior PET-CT of 01/01/2018, today's exam and interval CT exams reveal that there is recurrent adenopathy in the left lower neck, left supraclavicular region, in the retroperitoneum, and in the left hemipelvis which is higher in metabolic activity than that shown on prior  PET-CT (currently Deauville 5, previously ranging from Deauville 2 through Deauville 5). 2. There is some scattered residual axillary lymph nodes, currently Deauville 2 activity. 3. Patchy airspace opacities in the lower lobes, increased on the right but reduced on the left, possibly from atypical pneumonia or aspiration pneumonitis. 4. Air fluid level in the rectum favoring diarrheal process. 5. Other imaging findings of potential clinical significance: Aortic Atherosclerosis (ICD10-I70.0). Coronary atherosclerosis. Mild cardiomegaly. Trace bilateral pleural effusions. Mesenteric edema. Large left scrotal hydrocele. Prostatomegaly. Electronically Signed   By: Van Clines M.D.   On: 05/23/2019 13:25   US Venous Img Upper Uni Right(DVT)  Result Date: 05/21/2019 CLINICAL DATA:  69 year old with right upper extremity swelling. EXAM: RIGHT UPPER EXTREMITY VENOUS DOPPLER ULTRASOUND TECHNIQUE: Gray-scale sonography with graded compression, as well as color Doppler and duplex ultrasound were performed to evaluate the upper extremity deep venous system from the level of the subclavian vein and including the jugular, axillary, basilic, radial, ulnar and upper cephalic vein. Spectral Doppler was utilized to evaluate flow at rest and with distal augmentation maneuvers. COMPARISON:  None. FINDINGS: Contralateral Subclavian Vein: Normal color Doppler flow and phasicity. Internal Jugular Vein: No evidence of thrombus. Normal compressibility, color Doppler flow and phasicity. Subclavian Vein: No evidence of thrombus. Normal color Doppler flow and phasicity. Axillary Vein: No evidence of thrombus. Normal compressibility, color Doppler flow and augmentation. Cephalic Vein: No evidence of thrombus. Normal compressibility, color Doppler flow and augmentation. Basilic Vein: No evidence of thrombus. Normal compressibility, color Doppler flow and augmentation. Brachial Veins: No evidence of thrombus. Normal compressibility,  color Doppler flow and augmentation. Radial Veins: No evidence of thrombus. Normal compressibility and color Doppler flow. Ulnar Veins: No evidence of thrombus. Normal compressibility and color Doppler flow. Other Findings: No discrete abnormality at the area of concern in the right forearm. IMPRESSION: No evidence of DVT within the right upper extremity. Electronically Signed   By: Markus Daft M.D.   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  IGVH mutated.  Currently off ibrutinib because of progression of disease noted.  CT scan March 2020-progressive lymphadenopathy in the chest/abdomen pelvis.  PET scan March 2020 [inpatient]-progressive disease mostly in the abdomen pelvis SUV around 10-15.  #Patient symptomatic from  his progressive disease [hypercalcemia; low-grade fevers; worsening fatigue].  Patient still undecided.  I have encouraged the patient and wife to let us know their thoughts.Marland Kitchen  #Low-grade fevers-likely secondary to underlying lymphoma.  Continue NSAIDs.  As above  #Hypercalcemia-concerning for malignancy related-mental status changes; s/p Zometa improved.  #Anemia-hemoglobin 8.1-iron deficiency/ FOBT positive-currently off Eliquis given all acute issues.  Continue Venofer.  Wants to cancel his GI appointments.  #Bilateral PE [sep 2019]-HELD Eliquis [jan 18th 2020- held- sec to worsening anemia].  Off Eliquis.  #I had a long discussion the patient and his wife regarding overall seriousness of the disease process.  And the inability of the patient to make any decisions with regards to treatment-we will be quite detrimental to his health.  I would recommend palliative care evaluation next visit.   DISPOSITION: # venofer today.  # Josh in 2 weeks. # follow up in 2 weeks- MD; labs- cbc/cmp; possible Venofer-Dr.B    All questions were answered. The patient knows to call  the clinic with any problems, questions or concerns.     Cammie Sickle, MD 06/19/2019 3:20 PM

## 2019-06-13 NOTE — Progress Notes (Signed)
Pt tolerated infusion well. Pt and VS stable at discharge.  

## 2019-06-13 NOTE — Assessment & Plan Note (Addendum)
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  Currently off ibrutinib because of progression of disease noted.  CT scan March 2020-progressive lymphadenopathy in the chest/abdomen pelvis.  PET scan March 2020 [inpatient]-progressive disease mostly in the abdomen pelvis SUV around 10-15.  #Patient symptomatic from his progressive disease [hypercalcemia; low-grade fevers; worsening fatigue].  Patient still undecided.  I have encouraged the patient and wife to let us know their thoughts.Marland Kitchen  #Low-grade fevers-likely secondary to underlying lymphoma.  Continue NSAIDs.  As above  #Hypercalcemia-concerning for malignancy related-mental status changes; s/p Zometa improved.  #Anemia-hemoglobin 8.1-iron deficiency/ FOBT positive-currently off Eliquis given all acute issues.  Continue Venofer.  Wants to cancel his GI appointments.  #Bilateral PE [sep 2019]-HELD Eliquis [jan 18th 2020- held- sec to worsening anemia].  Off Eliquis.  #I had a long discussion the patient and his wife regarding overall seriousness of the disease process.  And the inability of the patient to make any decisions with regards to treatment-we will be quite detrimental to his health.  I would recommend palliative care evaluation next visit.   DISPOSITION: # venofer today.  # Josh in 2 weeks. # follow up in 2 weeks- MD; labs- cbc/cmp; possible Venofer-Dr.B

## 2019-06-13 NOTE — Progress Notes (Signed)
Patient here for follow-up for lymphoma. Per wife (historian), patient has not required any in/out cath. Patient has been urinating without difficulty. Patient has PT in home.

## 2019-06-16 ENCOUNTER — Inpatient Hospital Stay (HOSPITAL_BASED_OUTPATIENT_CLINIC_OR_DEPARTMENT_OTHER): Payer: Medicare Other | Admitting: Hospice and Palliative Medicine

## 2019-06-16 ENCOUNTER — Telehealth: Payer: Self-pay | Admitting: *Deleted

## 2019-06-16 DIAGNOSIS — C8308 Small cell B-cell lymphoma, lymph nodes of multiple sites: Secondary | ICD-10-CM

## 2019-06-16 MED ORDER — ESCITALOPRAM OXALATE 10 MG PO TABS: ORAL_TABLET | ORAL | 1 refills | Status: AC

## 2019-06-16 NOTE — Telephone Encounter (Signed)
Please ask patient to stop taking oral iron.  In addition to Colace; I would recommend MiraLAX 1 scoop/8 ounce water once a day.   GB

## 2019-06-16 NOTE — Telephone Encounter (Signed)
Spoke with patient's wife. Recommendations for adding miralax and stopping oral iron provided. Wife read back md instructions.

## 2019-06-16 NOTE — Telephone Encounter (Signed)
Wife called reporting that patient is on oral and IV iron and that he is having problems with contipation. [place] he is taking colace and that something was mentioned about Miralax, but she does not know how much and what to do about the colace. Please advise

## 2019-06-16 NOTE — Progress Notes (Signed)
Virtual Visit via Telephone Note  I connected with [name] on 06/16/19 at  1:30 PM EDT by telephone and verified that I am speaking with the correct person using two identifiers.   I discussed the limitations, risks, security and privacy concerns of performing an evaluation and management service by telephone and the availability of in person appointments. I also discussed with the patient that there may be a patient responsible charge related to this service. The patient expressed understanding and agreed to proceed.   History of Present Illness: [name] is a 68 y.o. 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  Patient was recently hospitalized 05/06/2019-05/09/2019 with altered mental status and fevers.  Patient was treated for possible aspiration pneumonia.  Patient never quite returned to his previous baseline with some residual confusion and weakness.  He was readmitted 05/20/2019 again with altered mental status and confusion.  He was treated for clinical sepsis but underlying etiology was not clear.  Patient had PET scan on 05/23/2019 revealing resolution of adenopathy that was present during previous PET scan but with recurrence of hypermetabolic lymphadenopathy in the neck, supraclavicular region, retroperitoneum, and left hemipelvis.  Palliative care was consulted help address goals.   Observations/Objective: I called and spoke with patient and wife by phone.  Communicating with patient was challenged by his moods and hearing deficit.  He spent a significant amount of time cursing at his wife who was trying to translate my conversation to him.  Patient saw Dr. Rogue Bussing in the clinic on 4/9 at which time patient  remained undecided about treatment options.  Patient has extremely limited insight into his current disease status.  I question if patient fully has capacity for complex decision-making.  Wife has agreed to allow hospice involvement and they are scheduled for a start of care visit tomorrow.  Symptomatically, patient is having significant amounts of anxiety.  Wife would like to try treatment.  We will start low-dose SSRI.  Assessment and Plan: small B-cell lymphoma - patient has very limited insight into his current health problems. Communication is also compounded by patient's hearing deficit. Patient undecided on treatment.  Hospice has been ordered.  Anxiety - start citalopram 10mg  daily   Weakness - wife is having ramp built. He is receiving PT in the home but with plan to transition to hospice tomorrow  Constipation - adjusted bowel regimen.   GOC - see above. Will also need to readdress ACP but will defer to hospice to help coordinate these conversations  Follow Up Instructions: As needed   I discussed the assessment and treatment plan with the patient. The patient was provided an opportunity to ask questions and all were answered. The patient agreed with the plan and demonstrated an understanding of the instructions.   The patient was advised to call back or seek an in-person evaluation if the symptoms worsen or if the condition fails to improve as anticipated.  I provided 15 minutes of non-face-to-face time during this encounter.   Irean Hong, NP

## 2019-06-16 NOTE — Telephone Encounter (Signed)
Dr. B - please advise. 

## 2019-06-17 ENCOUNTER — Encounter: Payer: Self-pay | Admitting: Nurse Practitioner

## 2019-06-19 ENCOUNTER — Ambulatory Visit: Payer: Medicare Other | Admitting: Gastroenterology

## 2019-06-25 ENCOUNTER — Telehealth: Payer: Self-pay | Admitting: *Deleted

## 2019-06-25 NOTE — Telephone Encounter (Signed)
Wife called to cnl patient's apts in the cancer center. Patient continues to decline and falls frequently. Wife feels that it's in the best interest of the patient to cnl apts and just have hospice nurse evaluate as needed.

## 2019-06-26 NOTE — Telephone Encounter (Signed)
Heather-I agree that pt should go on hospice. Josh- please also reach out to wife. Thx GB

## 2019-06-27 ENCOUNTER — Inpatient Hospital Stay: Payer: Medicare Other

## 2019-06-27 ENCOUNTER — Inpatient Hospital Stay: Payer: Medicare Other | Admitting: Internal Medicine

## 2019-06-27 ENCOUNTER — Inpatient Hospital Stay: Payer: Medicare Other | Admitting: Hospice and Palliative Medicine

## 2019-06-29 ENCOUNTER — Telehealth: Payer: Self-pay | Admitting: Internal Medicine

## 2019-06-29 NOTE — Telephone Encounter (Signed)
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  Patient poor candidate for further chemotherapy/patient strong preference against chemotherapy.   Reassured the patient that she made the decision against chemotherapy as per his best interest/per patient preference. Recommend hospice.  As per wife hospice on board.  Continue hospice.

## 2019-06-30 NOTE — Telephone Encounter (Signed)
I spoke with patient's hospice nurse, Mitzie ([facility]).  Patient reportedly is declining.  He is mostly now chair or bed bound.  They ordered a hospital bed for in the home.  Oral intake is poor.  Family goals are to keep him home and comfortable.  He is a DNR.

## 2019-08-12 ENCOUNTER — Telehealth: Payer: Self-pay | Admitting: *Deleted

## 2019-08-12 ENCOUNTER — Telehealth: Payer: Self-pay | Admitting: Internal Medicine

## 2019-09-04 NOTE — Telephone Encounter (Signed)
On 6/08- I called patient's wife, Hoyle Sauer and offered my condolences.  Family very thankful for the call. Appreciate the cancer center staff for the care and support provided to the patient.   GB

## 2019-09-04 NOTE — Telephone Encounter (Signed)
Mitzi with Hospice called to report that patient expired this morning at 8:50 AM.

## 2019-09-04 DEATH — deceased

## 2020-07-24 IMAGING — CT CT HEAD W/O CM
3 series · 15 of 47 positions shown, 18 images · non-contrast
Comparison: 05/06/2019

CLINICAL DATA: Patient with lymphoma undergoing treatment.
Cephalopathy.

EXAM:
CT HEAD WITHOUT CONTRAST
TECHNIQUE: Contiguous axial images were obtained from the base of the skull
through the vertex without intravenous contrast.

[Series 3: head wo · axial · 0.46mm/px · z∈[-151,-26]mm · 9 of 31 slices shown, 12 images]
[im 3/31  brain]
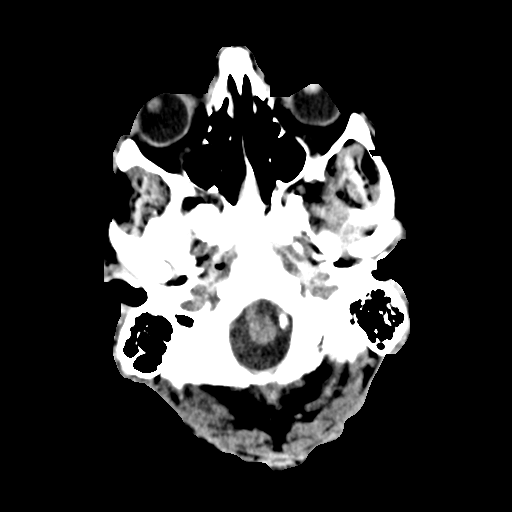
[im 3/31  bone]
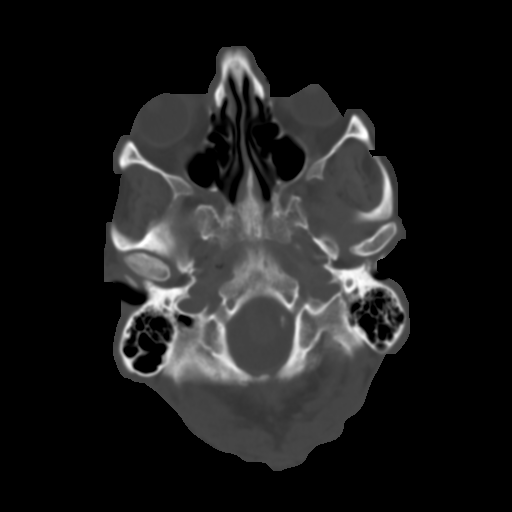
[im 6/31  brain]
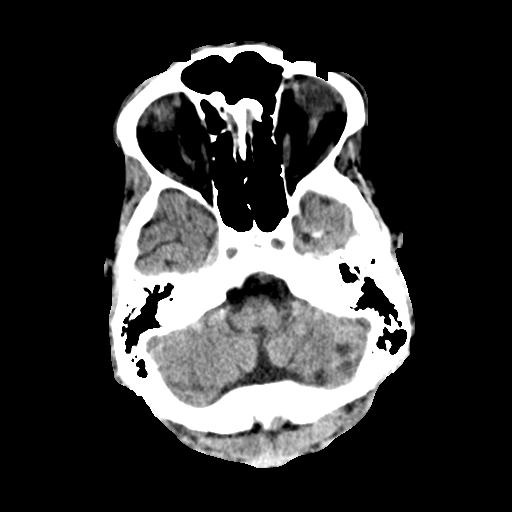
[im 9/31  brain]
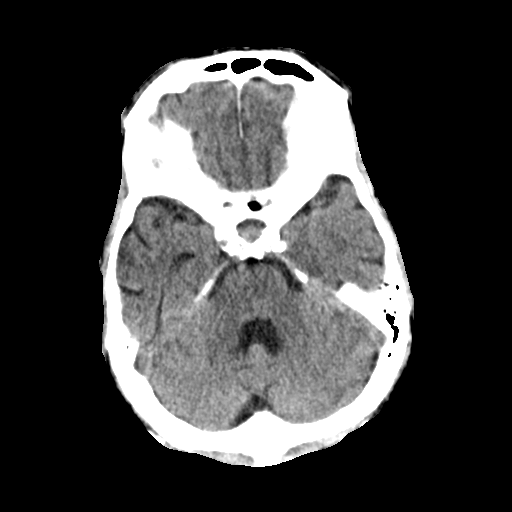
[im 12/31  brain]
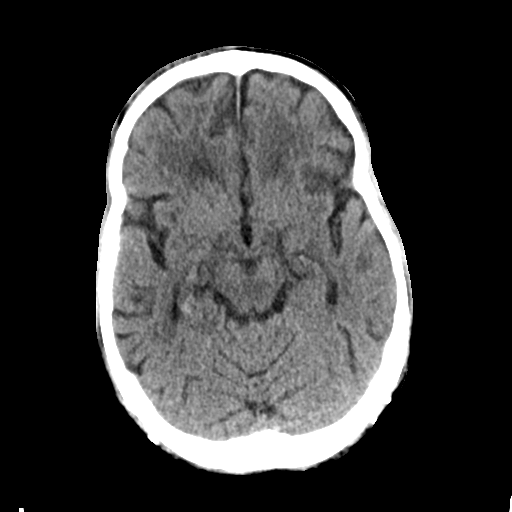
[im 16/31  brain]
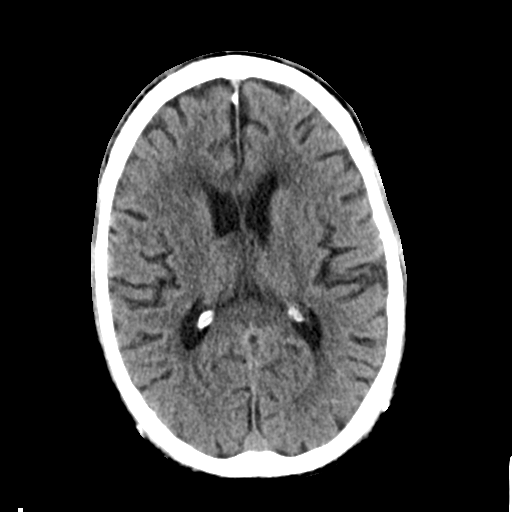
[im 16/31  bone]
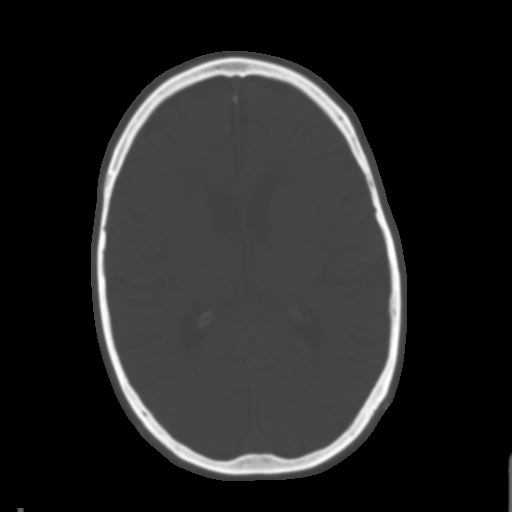
[im 19/31  brain]
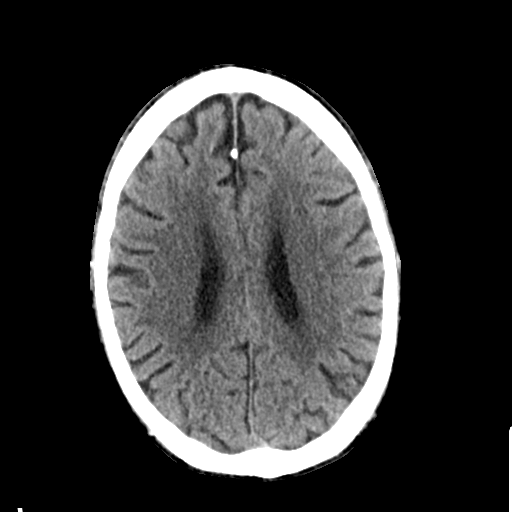
[im 22/31  brain]
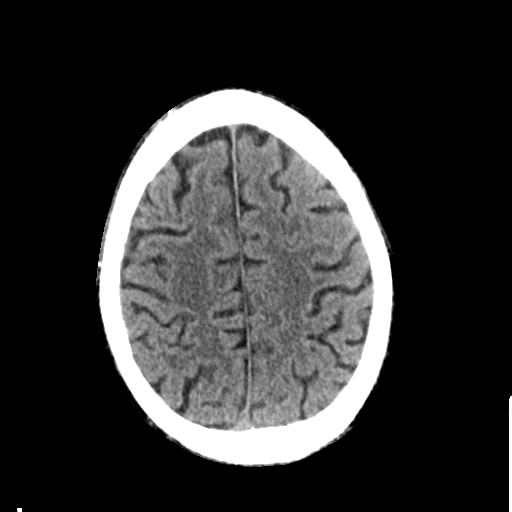
[im 25/31  brain]
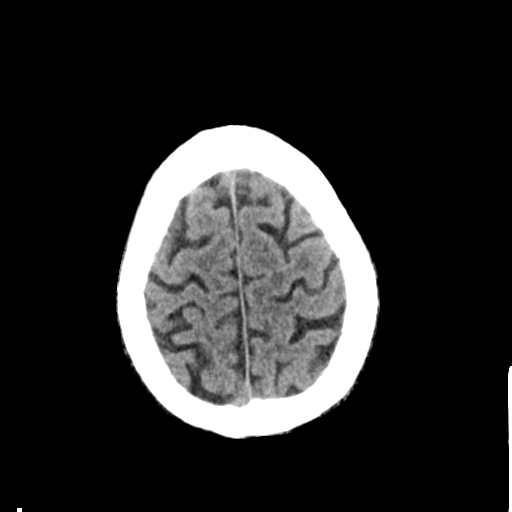
[im 28/31  brain]
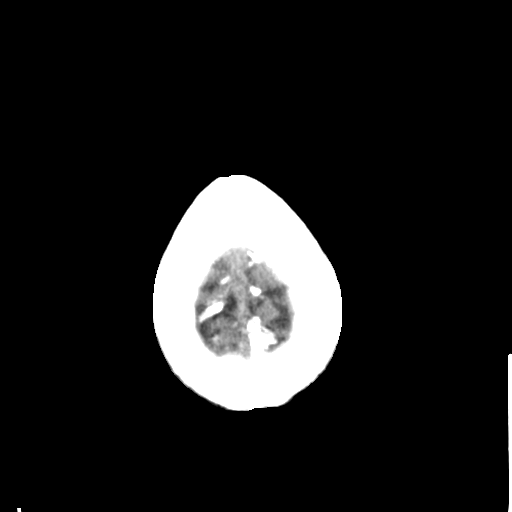
[im 28/31  bone]
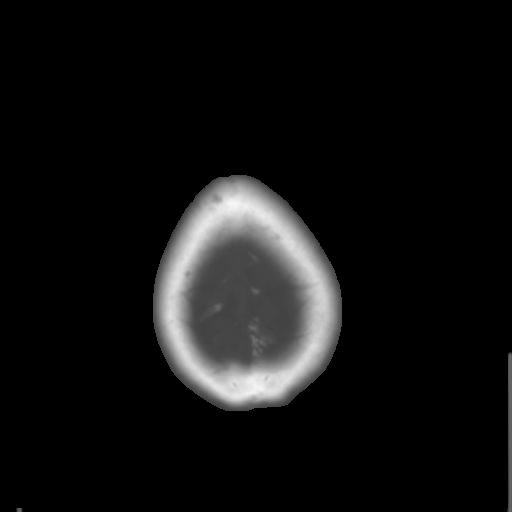

[Series 4: coronal soft tissue · coronal · 0.31mm/px · 3 of 65 slices shown]
[im 22/65  brain]
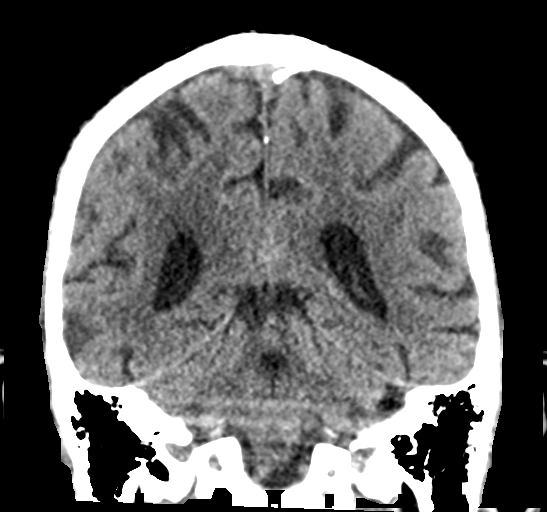
[im 29/65  brain]
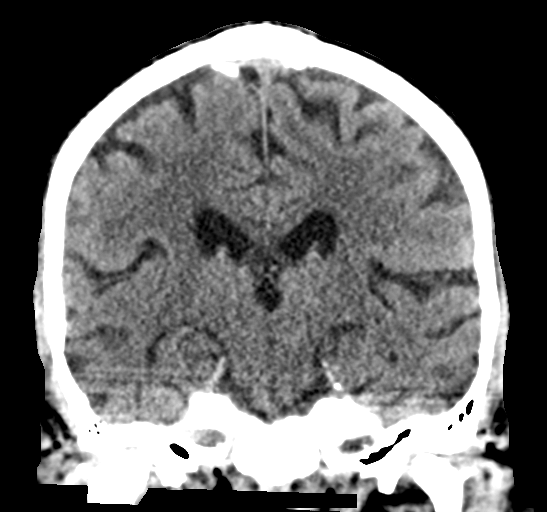
[im 36/65  brain]
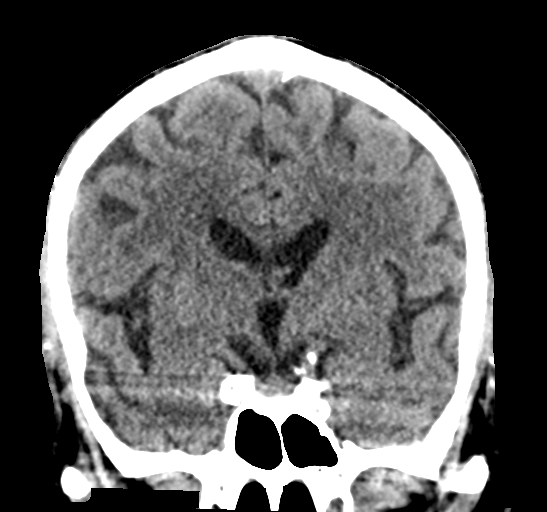

[Series 5: sagittal soft tissue · sagittal · 0.30mm/px · 3 of 53 slices shown]
[im 18/53  brain]
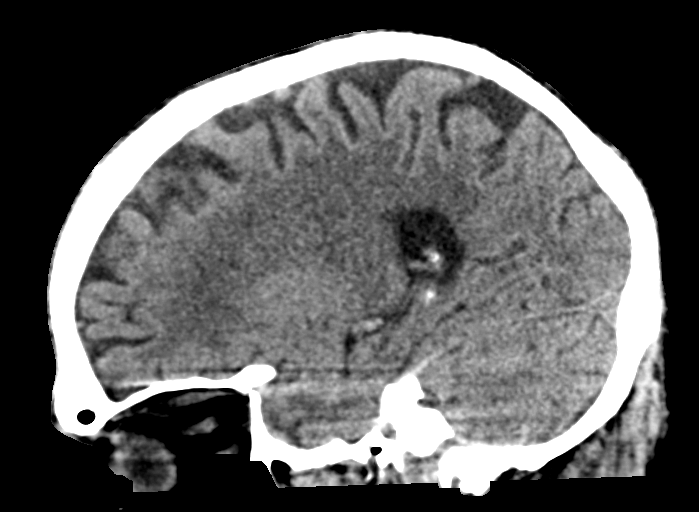
[im 27/53  brain]
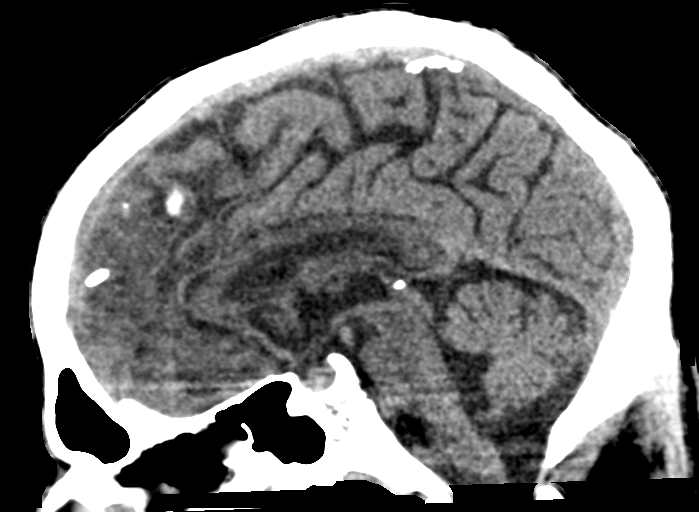
[im 35/53  brain]
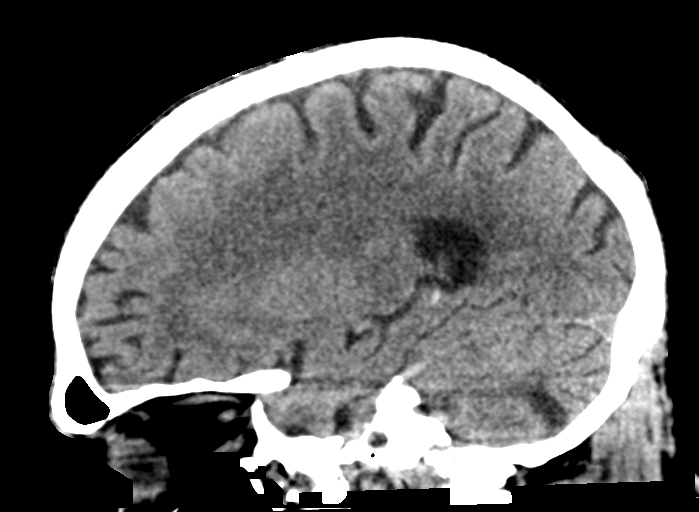

[15 of 47 positions shown; findings below may reference images not displayed]

FINDINGS: Brain: Old small vessel infarctions in the left cerebellum. Cerebral
hemispheres show chronic small-vessel change of the white matter. No
sign of acute infarction, mass lesion, hemorrhage, hydrocephalus or
extra-axial collection.

Vascular: There is atherosclerotic calcification of the major
vessels at the base of the brain.

Skull: Negative

Sinuses/Orbits: Clear/normal

Other: None
IMPRESSION: No acute finding. Chronic small-vessel change of the cerebral
hemispheric white matter. Old small vessel cerebellar infarctions.

## 2020-07-25 IMAGING — US US EXTREM  UP VENOUS*R*
1 series · 13 of 24 positions shown · non-contrast
Comparison: None.

CLINICAL DATA: 68-year-old with right upper extremity swelling.



[Series 1: us venous img upper uni right (dvt) · portal-venous · 46 acquisitions, 13 frames shown]
[im 1/46]
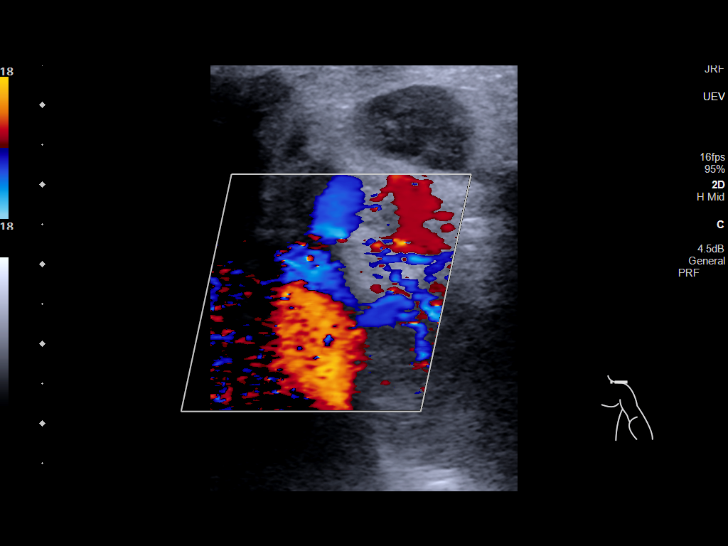
[im 4/46]
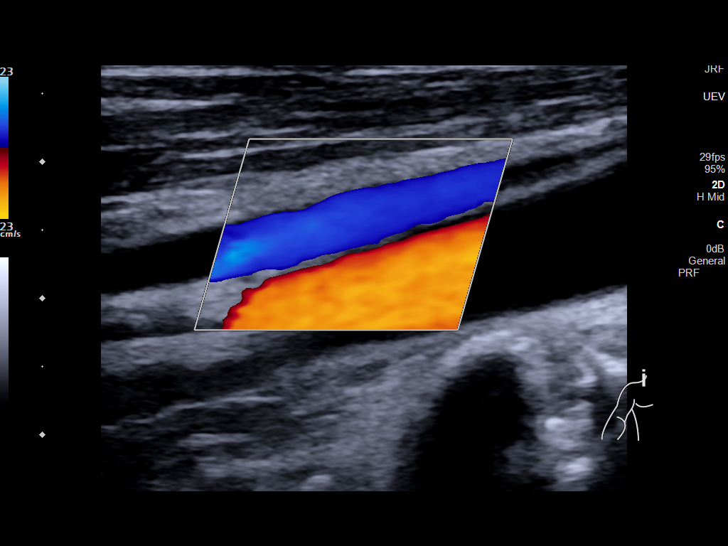
[im 8/46]
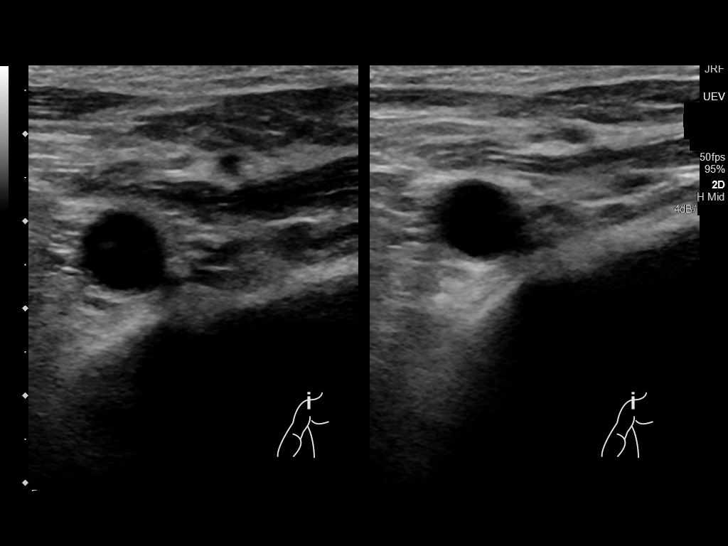
[im 12/46]
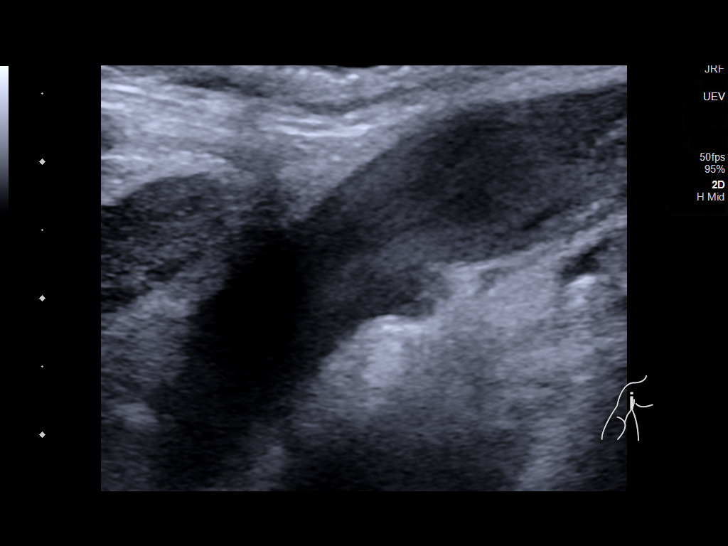
[im 14/46]
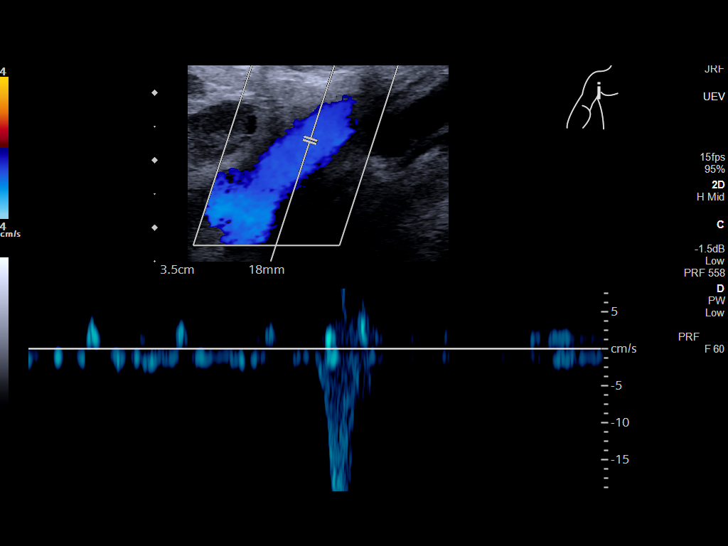
[im 18/46]
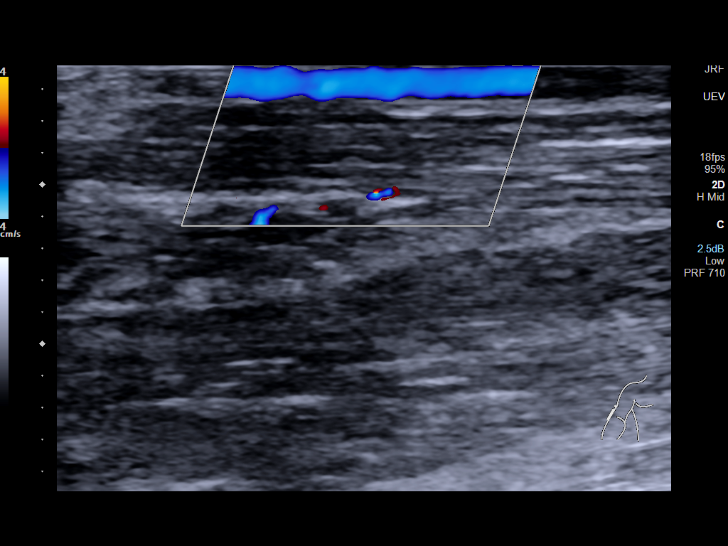
[im 24/46]
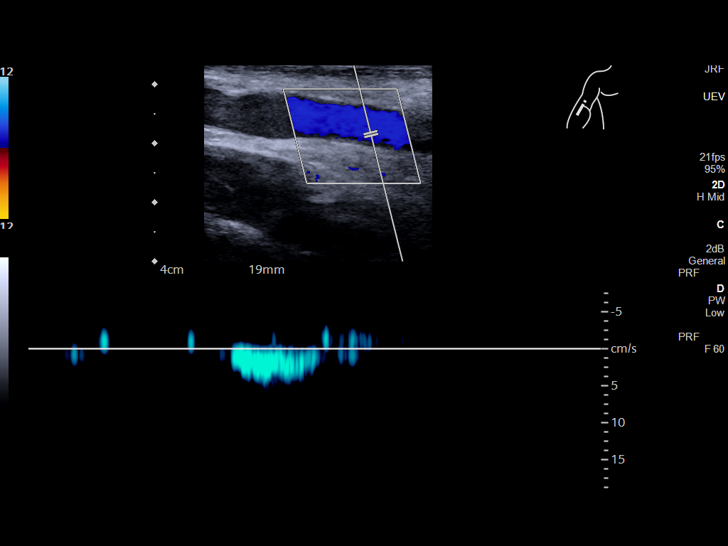
[im 26/46]
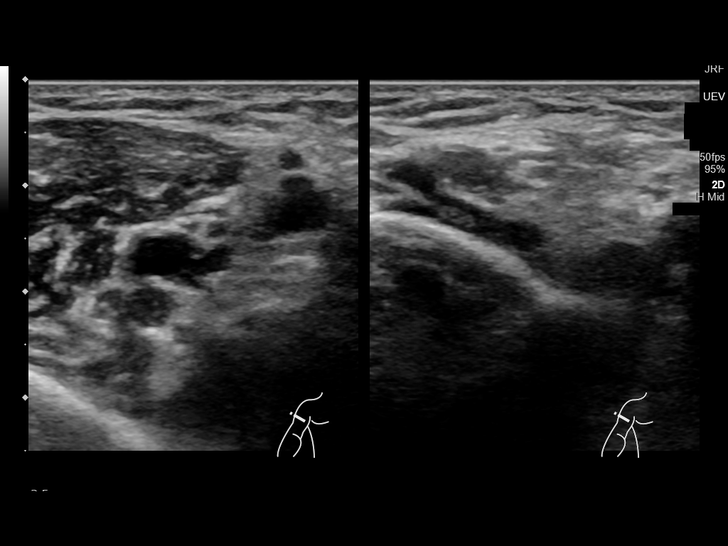
[im 30/46]
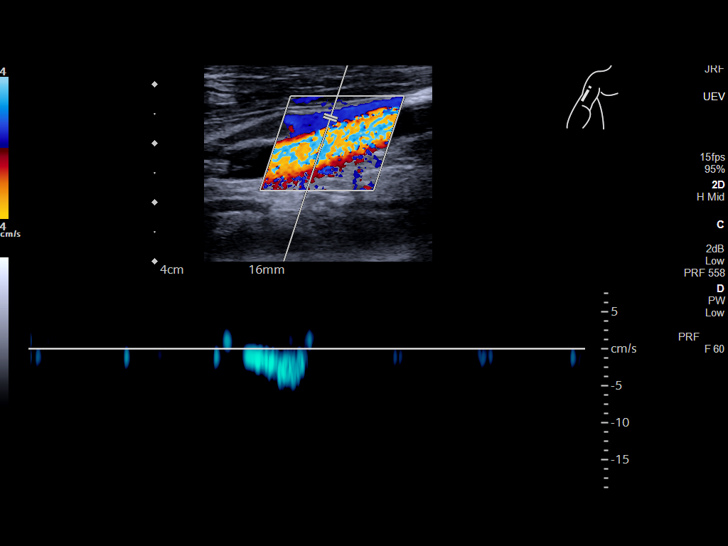
[im 34/46]
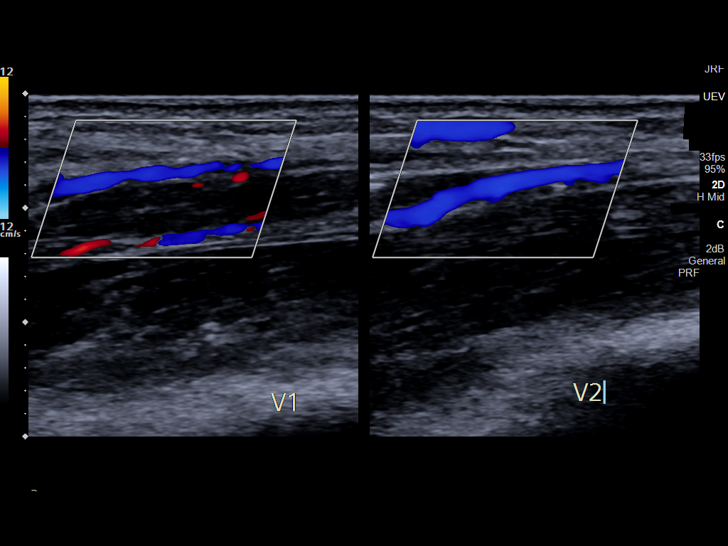
[im 38/46]
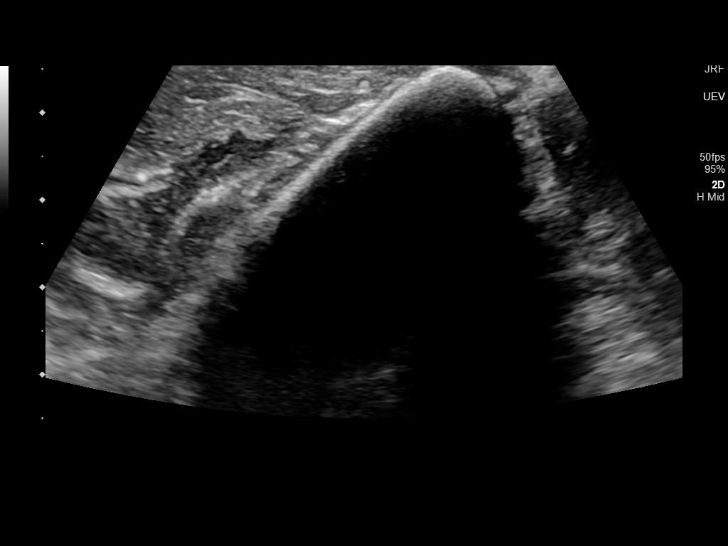
[im 42/46]
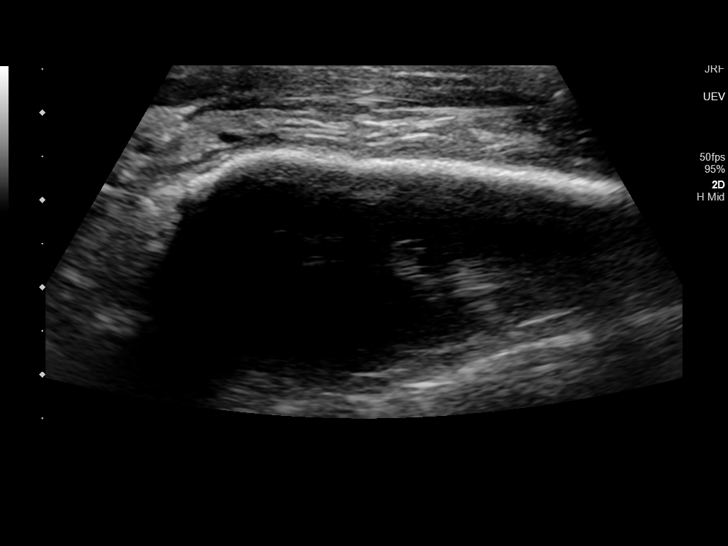
[im 46/46]
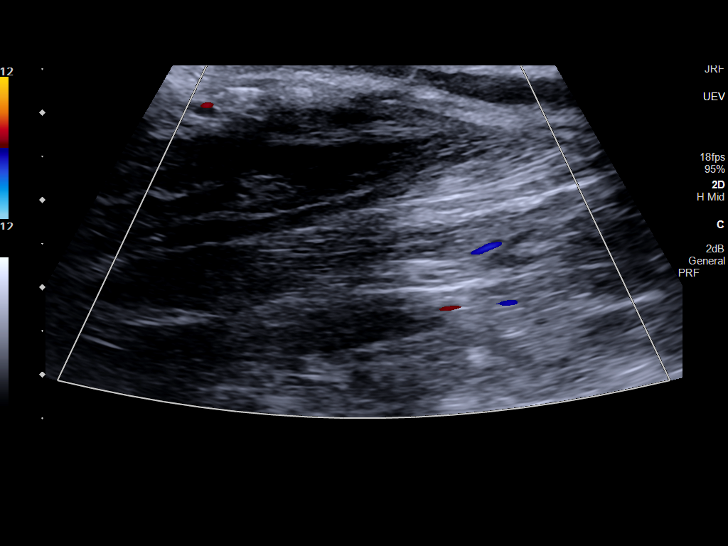

[13 of 24 positions shown; findings below may reference images not displayed]

FINDINGS: Contralateral Subclavian Vein: Normal color Doppler flow and
phasicity.

Internal Jugular Vein: No evidence of thrombus. Normal
compressibility, color Doppler flow and phasicity.

Subclavian Vein: No evidence of thrombus. Normal color Doppler flow
and phasicity.

Axillary Vein: No evidence of thrombus. Normal compressibility,
color Doppler flow and augmentation.

Cephalic Vein: No evidence of thrombus. Normal compressibility,
color Doppler flow and augmentation.

Basilic Vein: No evidence of thrombus. Normal compressibility, color
Doppler flow and augmentation.

Brachial Veins: No evidence of thrombus. Normal compressibility,
color Doppler flow and augmentation.

Radial Veins: No evidence of thrombus. Normal compressibility and
color Doppler flow.

Ulnar Veins: No evidence of thrombus. Normal compressibility and
color Doppler flow.

Other Findings: No discrete abnormality at the area of concern in
the right forearm.
IMPRESSION: No evidence of DVT within the right upper extremity.

## 2020-07-26 IMAGING — CR DG CHEST 2V
2 series · 3 of 3 positions shown · non-contrast
Comparison: May 20, 2019

CLINICAL DATA: Fever

EXAM:
CHEST - 2 VIEW

[Series 2: chest lat · 0.14mm/px · 2 of 2 slices shown]
[im 1/2]
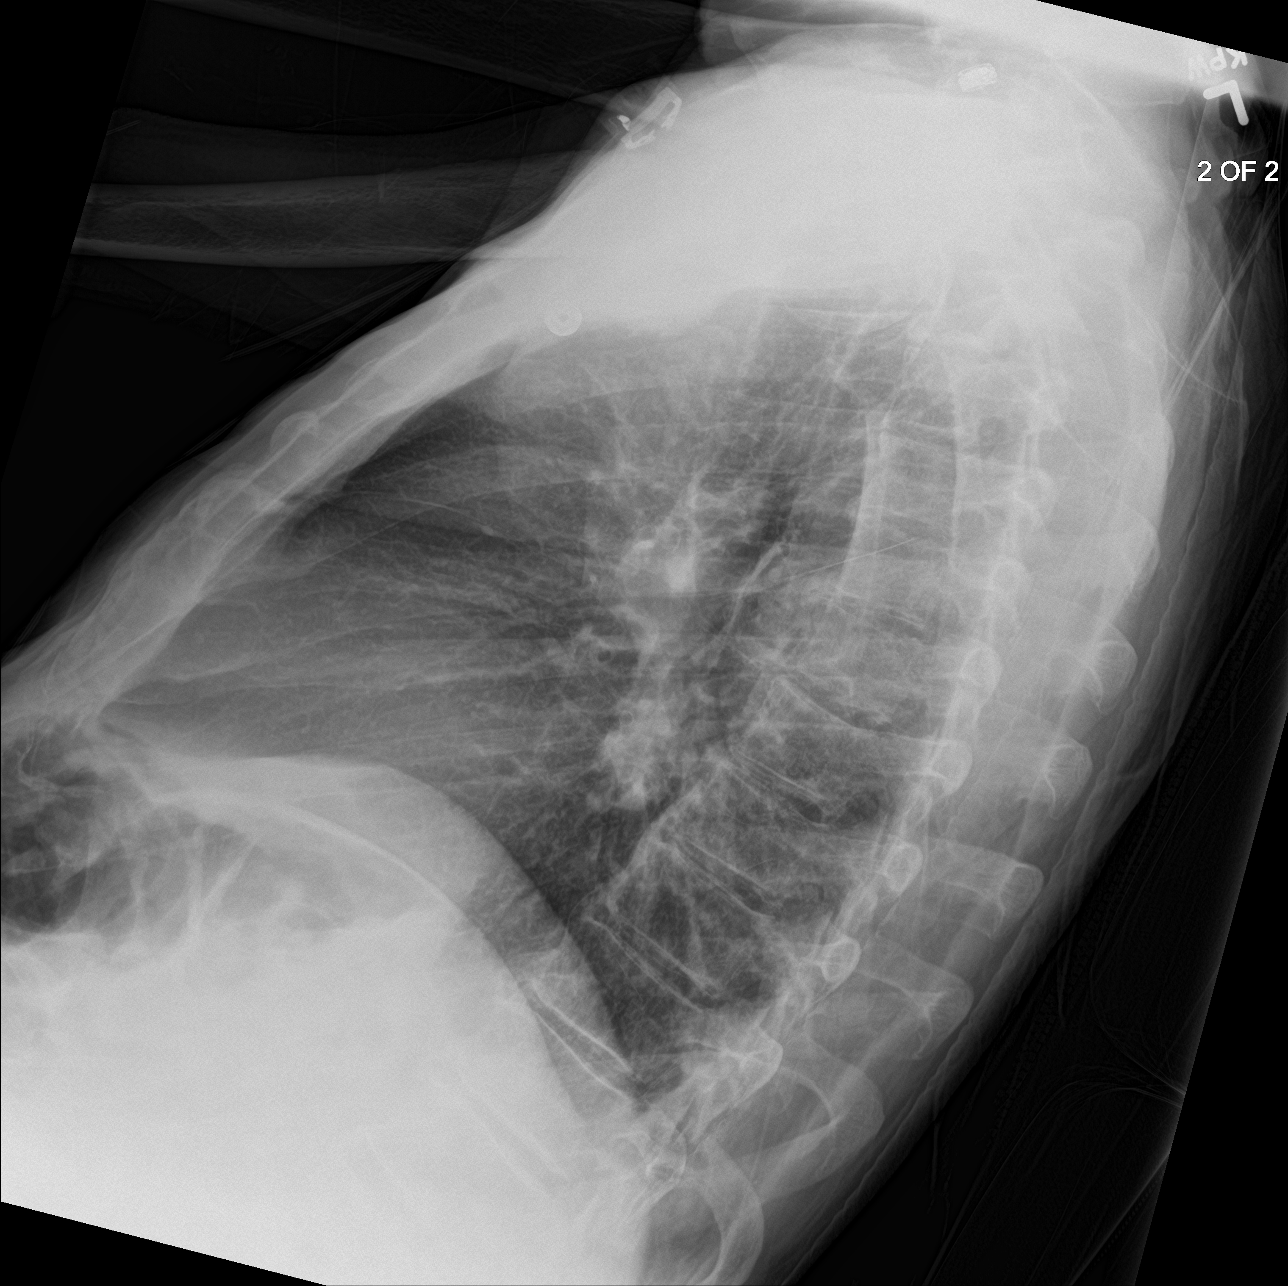
[im 2/2]
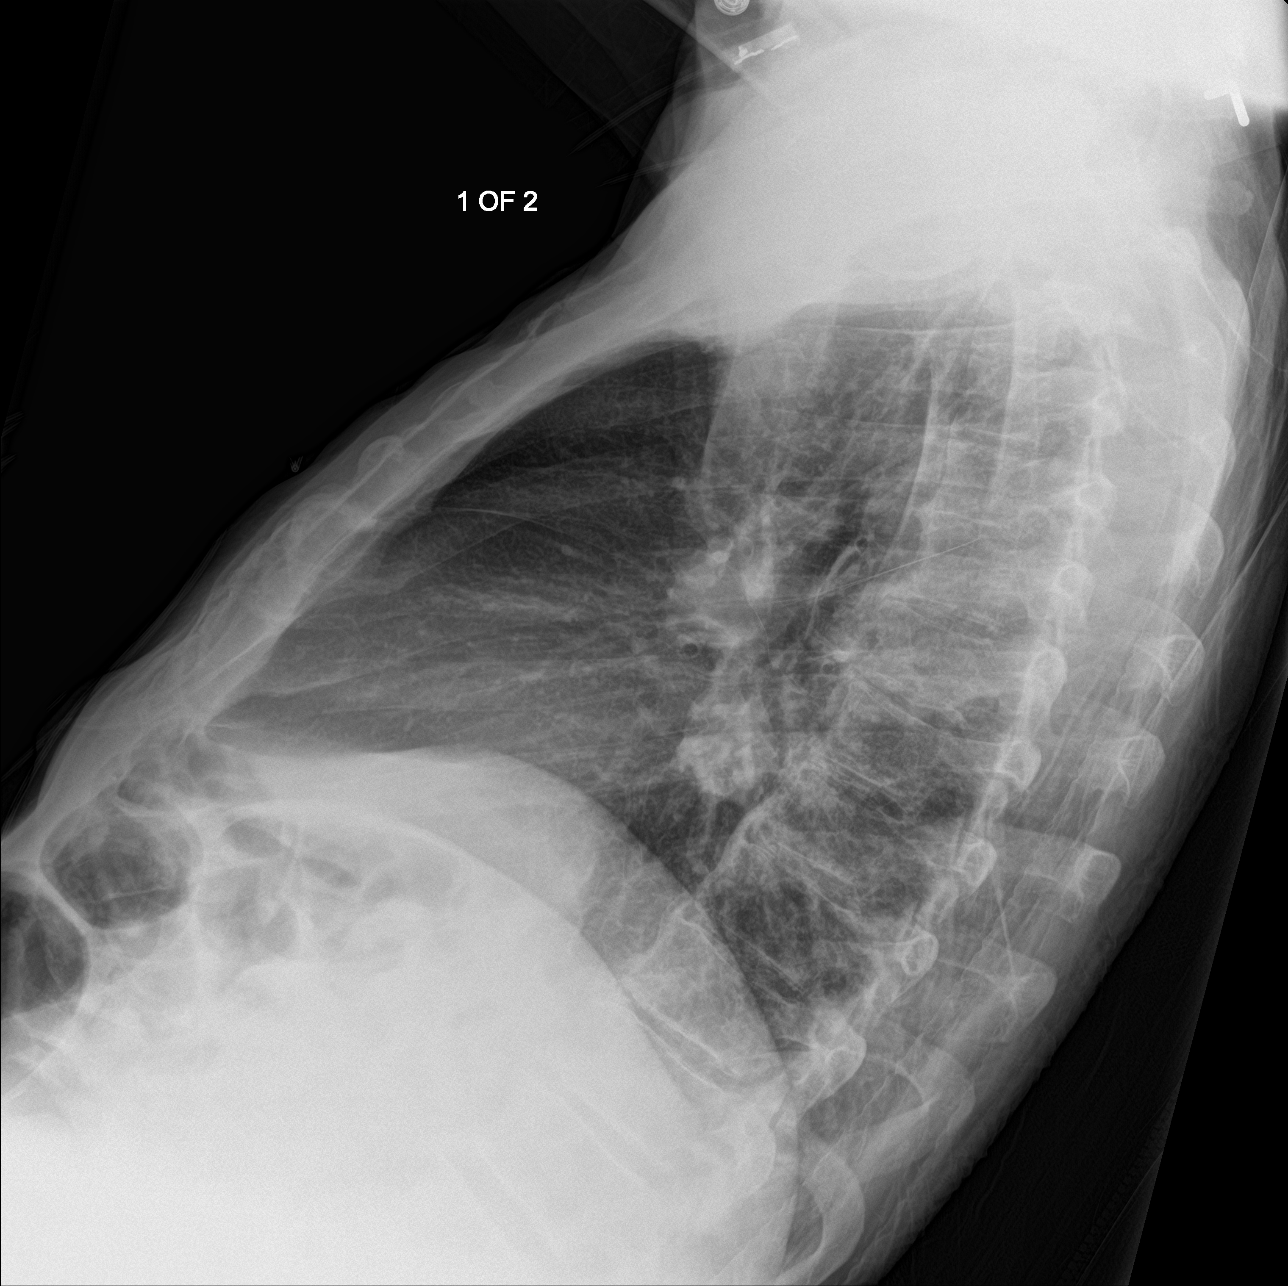

[chest ap]
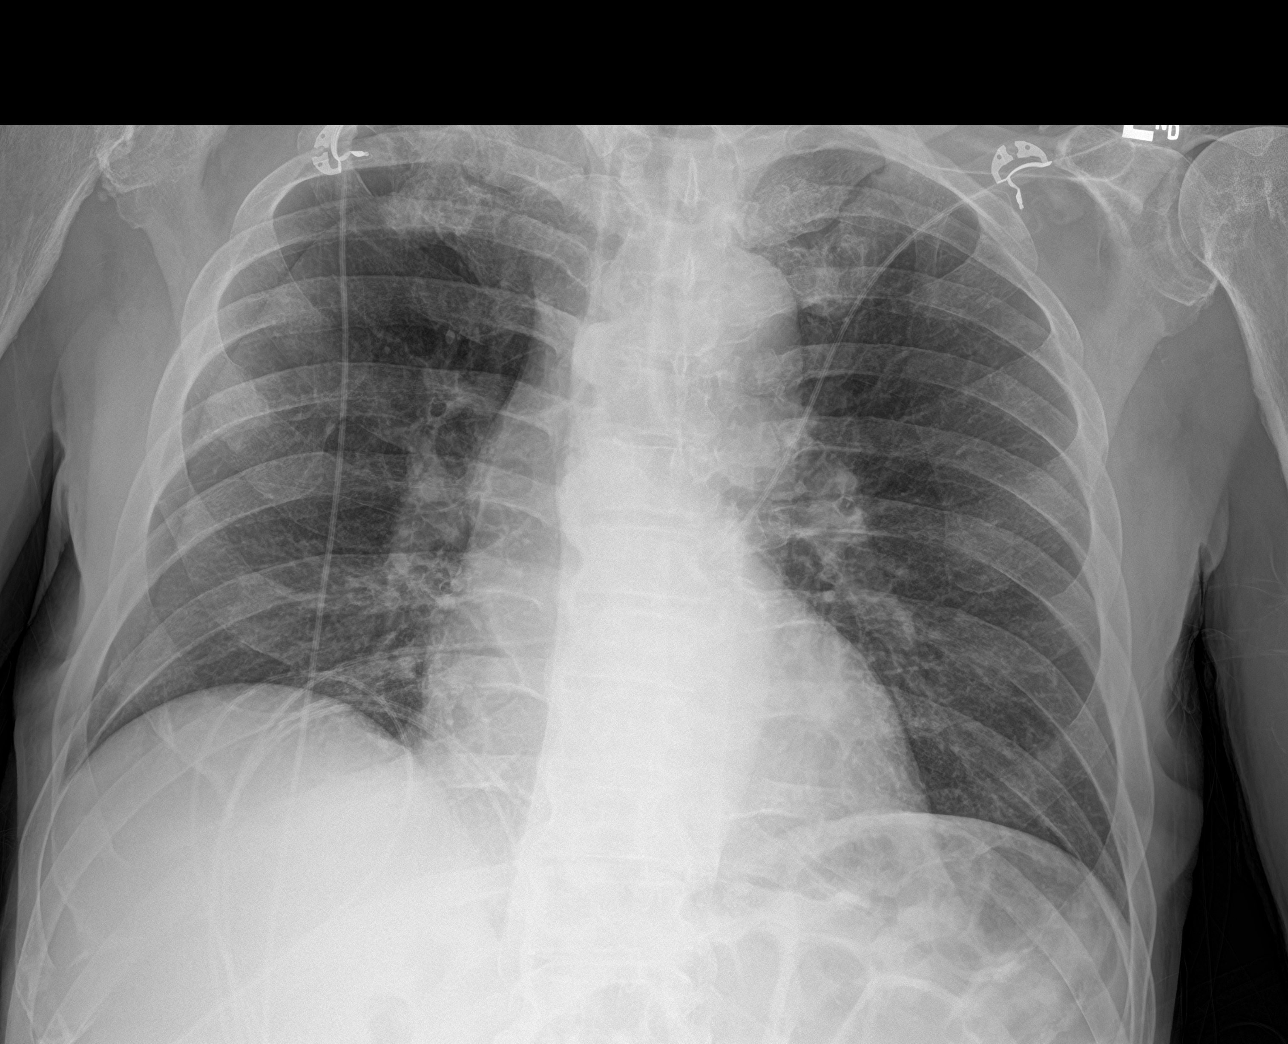

[3 of 3 positions shown; findings below may reference images not displayed]

FINDINGS: Lungs are clear. Heart size and pulmonary vascularity are normal. No
adenopathy. There is degenerative change in the lower thoracic
spine. There is a skin fold on the right. No appreciable
pneumothorax.
IMPRESSION: Lungs clear. Cardiac silhouette within normal limits. Apparent skin
fold on the right.
